# Patient Record
Sex: Female | Born: 1935 | Race: White | Hispanic: No | Smoking: Former smoker
Health system: Southern US, Community
[De-identification: ages and names within clinical notes are randomized; demographics above are authoritative.]

## PROBLEM LIST (undated history)

## (undated) DIAGNOSIS — K219 Gastro-esophageal reflux disease without esophagitis: Secondary | ICD-10-CM

## (undated) DIAGNOSIS — F329 Major depressive disorder, single episode, unspecified: Secondary | ICD-10-CM

## (undated) DIAGNOSIS — C8590 Non-Hodgkin lymphoma, unspecified, unspecified site: Secondary | ICD-10-CM

## (undated) DIAGNOSIS — I209 Angina pectoris, unspecified: Secondary | ICD-10-CM

## (undated) DIAGNOSIS — R002 Palpitations: Secondary | ICD-10-CM

## (undated) DIAGNOSIS — R630 Anorexia: Secondary | ICD-10-CM

## (undated) DIAGNOSIS — J189 Pneumonia, unspecified organism: Secondary | ICD-10-CM

## (undated) DIAGNOSIS — I10 Essential (primary) hypertension: Secondary | ICD-10-CM

## (undated) DIAGNOSIS — F419 Anxiety disorder, unspecified: Secondary | ICD-10-CM

## (undated) DIAGNOSIS — I2510 Atherosclerotic heart disease of native coronary artery without angina pectoris: Secondary | ICD-10-CM

## (undated) DIAGNOSIS — R011 Cardiac murmur, unspecified: Secondary | ICD-10-CM

## (undated) DIAGNOSIS — H269 Unspecified cataract: Secondary | ICD-10-CM

## (undated) DIAGNOSIS — E119 Type 2 diabetes mellitus without complications: Secondary | ICD-10-CM

## (undated) DIAGNOSIS — H3530 Unspecified macular degeneration: Secondary | ICD-10-CM

## (undated) DIAGNOSIS — E7800 Pure hypercholesterolemia, unspecified: Secondary | ICD-10-CM

## (undated) DIAGNOSIS — R079 Chest pain, unspecified: Secondary | ICD-10-CM

## (undated) DIAGNOSIS — I951 Orthostatic hypotension: Secondary | ICD-10-CM

## (undated) DIAGNOSIS — Z8489 Family history of other specified conditions: Secondary | ICD-10-CM

## (undated) DIAGNOSIS — I499 Cardiac arrhythmia, unspecified: Secondary | ICD-10-CM

## (undated) DIAGNOSIS — F32A Depression, unspecified: Secondary | ICD-10-CM

## (undated) DIAGNOSIS — G8929 Other chronic pain: Secondary | ICD-10-CM

## (undated) HISTORY — DX: Pneumonia, unspecified organism: J18.9

## (undated) HISTORY — DX: Palpitations: R00.2

## (undated) HISTORY — PX: ANKLE SURGERY: SHX546

## (undated) HISTORY — DX: Atherosclerotic heart disease of native coronary artery without angina pectoris: I25.10

## (undated) HISTORY — DX: Orthostatic hypotension: I95.1

## (undated) HISTORY — DX: Cardiac murmur, unspecified: R01.1

## (undated) HISTORY — DX: Major depressive disorder, single episode, unspecified: F32.9

## (undated) HISTORY — DX: Hypomagnesemia: E83.42

## (undated) HISTORY — DX: Unspecified cataract: H26.9

## (undated) HISTORY — DX: Type 2 diabetes mellitus without complications: E11.9

## (undated) HISTORY — DX: Anorexia: R63.0

## (undated) HISTORY — PX: ABDOMINAL HYSTERECTOMY: SHX81

## (undated) HISTORY — DX: Essential (primary) hypertension: I10

## (undated) HISTORY — DX: Chest pain, unspecified: R07.9

## (undated) HISTORY — PX: TOOTH EXTRACTION: SUR596

## (undated) HISTORY — DX: Depression, unspecified: F32.A

## (undated) HISTORY — DX: Unspecified macular degeneration: H35.30

## (undated) HISTORY — DX: Pure hypercholesterolemia, unspecified: E78.00

## (undated) HISTORY — PX: KNEE SURGERY: SHX244

## (undated) HISTORY — PX: INSERTION / PLACEMENT PLEURAL CATHETER: SUR721

## (undated) HISTORY — PX: PARTIAL HYSTERECTOMY: SHX80

## (undated) HISTORY — DX: Anxiety disorder, unspecified: F41.9

## (undated) HISTORY — DX: Non-Hodgkin lymphoma, unspecified, unspecified site: C85.90

## (undated) HISTORY — DX: Cardiac arrhythmia, unspecified: I49.9

## (undated) HISTORY — DX: Other chronic pain: G89.29

---

## 2000-01-25 HISTORY — PX: CARDIAC CATHETERIZATION: SHX172

## 2000-06-21 ENCOUNTER — Inpatient Hospital Stay (HOSPITAL_COMMUNITY): Admission: AD | Admit: 2000-06-21 | Discharge: 2000-06-22 | Payer: Self-pay | Admitting: Cardiovascular Disease

## 2000-06-21 ENCOUNTER — Encounter: Payer: Self-pay | Admitting: Cardiovascular Disease

## 2000-06-22 ENCOUNTER — Encounter: Payer: Self-pay | Admitting: Cardiovascular Disease

## 2003-01-08 ENCOUNTER — Other Ambulatory Visit: Payer: Self-pay

## 2004-01-05 ENCOUNTER — Ambulatory Visit: Payer: Self-pay | Admitting: Specialist

## 2004-01-05 ENCOUNTER — Other Ambulatory Visit: Payer: Self-pay

## 2004-03-18 ENCOUNTER — Ambulatory Visit: Payer: Self-pay | Admitting: Unknown Physician Specialty

## 2004-03-21 ENCOUNTER — Emergency Department: Payer: Self-pay | Admitting: Internal Medicine

## 2004-03-21 ENCOUNTER — Other Ambulatory Visit: Payer: Self-pay

## 2004-05-26 ENCOUNTER — Ambulatory Visit: Payer: Self-pay | Admitting: Otolaryngology

## 2004-09-20 ENCOUNTER — Ambulatory Visit: Payer: Self-pay | Admitting: Obstetrics and Gynecology

## 2005-06-24 ENCOUNTER — Ambulatory Visit: Payer: Self-pay | Admitting: Specialist

## 2005-10-03 ENCOUNTER — Ambulatory Visit: Payer: Self-pay | Admitting: Specialist

## 2005-10-06 ENCOUNTER — Ambulatory Visit: Payer: Self-pay | Admitting: Obstetrics and Gynecology

## 2006-11-30 ENCOUNTER — Ambulatory Visit: Payer: Self-pay | Admitting: Obstetrics and Gynecology

## 2006-12-27 ENCOUNTER — Emergency Department: Payer: Self-pay | Admitting: Internal Medicine

## 2006-12-27 ENCOUNTER — Other Ambulatory Visit: Payer: Self-pay

## 2007-09-19 ENCOUNTER — Encounter: Payer: Self-pay | Admitting: Cardiovascular Disease

## 2007-09-19 ENCOUNTER — Inpatient Hospital Stay: Payer: Self-pay | Admitting: Specialist

## 2007-09-19 ENCOUNTER — Other Ambulatory Visit: Payer: Self-pay

## 2007-10-05 ENCOUNTER — Encounter: Payer: Self-pay | Admitting: Cardiovascular Disease

## 2007-12-04 ENCOUNTER — Ambulatory Visit: Payer: Self-pay | Admitting: Specialist

## 2008-06-06 ENCOUNTER — Ambulatory Visit: Payer: Self-pay | Admitting: Family

## 2009-01-07 ENCOUNTER — Encounter: Payer: Self-pay | Admitting: Cardiovascular Disease

## 2009-03-09 ENCOUNTER — Encounter: Payer: Self-pay | Admitting: Cardiovascular Disease

## 2009-03-13 ENCOUNTER — Encounter: Payer: Self-pay | Admitting: Cardiovascular Disease

## 2009-03-16 ENCOUNTER — Encounter: Payer: Self-pay | Admitting: Cardiovascular Disease

## 2009-08-31 ENCOUNTER — Encounter: Payer: Self-pay | Admitting: Cardiovascular Disease

## 2009-09-16 ENCOUNTER — Ambulatory Visit: Payer: Self-pay | Admitting: Cardiovascular Disease

## 2009-09-16 DIAGNOSIS — R011 Cardiac murmur, unspecified: Secondary | ICD-10-CM

## 2009-09-16 DIAGNOSIS — I10 Essential (primary) hypertension: Secondary | ICD-10-CM | POA: Insufficient documentation

## 2009-09-16 DIAGNOSIS — E785 Hyperlipidemia, unspecified: Secondary | ICD-10-CM

## 2009-09-17 ENCOUNTER — Telehealth: Payer: Self-pay | Admitting: Cardiovascular Disease

## 2009-10-08 ENCOUNTER — Ambulatory Visit: Payer: Self-pay | Admitting: Specialist

## 2009-12-07 ENCOUNTER — Telehealth (INDEPENDENT_AMBULATORY_CARE_PROVIDER_SITE_OTHER): Payer: Self-pay | Admitting: Radiology

## 2009-12-07 ENCOUNTER — Telehealth (INDEPENDENT_AMBULATORY_CARE_PROVIDER_SITE_OTHER): Payer: Self-pay | Admitting: *Deleted

## 2010-01-13 ENCOUNTER — Encounter: Payer: Self-pay | Admitting: Cardiovascular Disease

## 2010-01-13 ENCOUNTER — Ambulatory Visit: Payer: Self-pay | Admitting: Cardiovascular Disease

## 2010-01-13 DIAGNOSIS — R079 Chest pain, unspecified: Secondary | ICD-10-CM

## 2010-01-24 HISTORY — PX: TUMOR REMOVAL: SHX12

## 2010-02-21 LAB — CONVERTED CEMR LAB
BUN: 11 mg/dL
Brain Natriuretic Peptide: 88.2
CO2: 25 meq/L
Calcium: 9.5 mg/dL
Chloride: 96 meq/L
Creatinine, Ser: 0.8 mg/dL
Glucose, Bld: 152 mg/dL
Potassium: 4.5 meq/L
Sodium: 135 meq/L

## 2010-02-23 NOTE — Progress Notes (Signed)
Summary: MEDICATION QUESTIONS  Phone Note Call from Patient Call back at Home Phone [phone]   Caller: SELF Call For: [facility] Summary of Call: PT HAS QUESTIONS ABOUT STOPPING TOPEROL OR DIOVAN Initial call taken by: Harlon Flor,  September 17, 2009 2:21 PM  Follow-up for Phone Call        pt's questions answered. Follow-up by: Benedict Needy, RN,  September 18, 2009 9:33 AM

## 2010-02-23 NOTE — Progress Notes (Signed)
Summary: Nuclear Pre-Procedure  Phone Note Call from Patient Call back at Home Phone [phone]   Summary of Call: Reviewed information on [employer] Information Sheet (see scanned document for further details).  Spoke with the patient. Stanton Kidney, EMT-P  December 07, 2009 9:19 AM      Nuclear Med Background Indications for Stress Test: Evaluation for Ischemia   History: Echo, Heart Catheterization, Myocardial Perfusion Study  History Comments: 8/09 Echo '02 MPS: NL '02 Heart Cath     Nuclear Pre-Procedure Cardiac Risk Factors: Hypertension, IDDM Type 2, Lipids, Obesity Height (in): 62

## 2010-02-23 NOTE — Assessment & Plan Note (Signed)
Summary: 1 year F/U   Visit Type:  Follow-up Primary Caroline Russo:  Caroline Russo,M.D.  CC:  Denies chest pain or shortness of breath..  History of Present Illness: Caroline Russo is a pleasant 77 year old woman with hypertension, nonobstructive coronary artery disease in 2002 for diabetes, obesity who presents to reestablish care. She is a patient of Dr. Leavy Cella.  Overall, she reports that she is doing well though is very discouraged her weight loss. She is eating the wrong things, not exercising. She denies any significant shortness of breath, chest pain. Otherwise has no new complaints.  In and going through her medications, she has been taking metoprolol succinate twice a day. She does not want to take a statin as she is afraid of them. She has not been monitoring her blood pressure at home.  EKG shows normal sinus rhythm with APCs, rate 63 beats per minute, no significant ST or T wave changes  Current Medications (verified): 1)  Alprazolam 0.25 Mg Tabs (Alprazolam) .... As Needed 2)  Aspirin 81 Mg Tbec (Aspirin) .... Take One Tablet Daily 3)  Levemir Flexpen 100 Unit/ml Soln (Insulin Detemir) .... 33 Units Two Times A Day 4)  Protonix 40 Mg Tbec (Pantoprazole Sodium) .Marland Kitchen.. 1 Tab Two Times A Day 5)  Metoprolol Succinate 50 Mg Xr24h-Tab (Metoprolol Succinate) .... Take One Tablet By Mouth Daily 6)  Vitamin B-12 100 Mcg Tabs (Cyanocobalamin) .Marland Kitchen.. 1 Tab Daily 7)  Vitamin D 400 Unit Caps (Cholecalciferol) .Marland Kitchen.. 1 Tab Daily 8)  Vitamin E 100 Unit Caps (Vitamin E) .Marland Kitchen.. 1 Tab Daily 9)  Antivert 12.5 Mg Tabs (Meclizine Hcl) .... As Needed 10)  Ocuvite Preservision  Tabs (Multiple Vitamins-Minerals) .Marland Kitchen.. 1 Tab Daily 11)  Diovan Hct 160-12.5 Mg Tabs (Valsartan-Hydrochlorothiazide) .Marland Kitchen.. 1 Tab Daily  Allergies (verified): No Known Drug Allergies  Past History:  Past Medical History: Last updated: 04/14/2009 Hypertension.  Type II diabetes mellitus, insulin requiring.  Hypercholesterolemia.    History of murmur.  Chronic tinnitus and inner ear pressure.   Past Surgical History: Last updated: 04/14/2009 cardiac cath 2002  Social History: Last updated: 04/14/2009 Retired  Widowed  3 children, 7 grandchildren, 2 great-grandchildren  Tobacco Use - No.   Risk Factors: Smoking Status: never (04/14/2009)  Review of Systems       The patient complains of weight gain.  The patient denies fever, weight loss, vision loss, decreased hearing, hoarseness, chest pain, syncope, dyspnea on exertion, peripheral edema, prolonged cough, abdominal pain, incontinence, muscle weakness, depression, and enlarged lymph nodes.    Vital Signs:  Patient profile:   77 year old female Height:      62 inches Weight:      169 pounds BMI:     31.02 Pulse rate:   63 / minute BP sitting:   169 / 76  (left arm) Cuff size:   large  Vitals Entered By: Bishop Dublin, CMA (September 16, 2009 10:48 AM)  Physical Exam  General:  Well developed, well nourished, in no acute distress. Head:  normocephalic and atraumatic Neck:  Neck supple, no JVD. No masses, thyromegaly or abnormal cervical nodes. Lungs:  Clear bilaterally to auscultation and percussion. Heart:  Non-displaced PMI, chest non-tender; regular rate and rhythm, S1, S2 with I-II/VI SEM RSB, no rubs or gallops. Carotid upstroke normal, no bruit. Pedals normal pulses. No edema, no varicosities. Abdomen:   abdomen soft and non-tender without masses, obese Msk:  Back normal, normal gait. Muscle strength and tone normal. Pulses:  pulses normal in all  4 extremities Extremities:  No clubbing or cyanosis. Neurologic:  Alert and oriented x 3. Skin:  Intact without lesions or rashes. Psych:  Normal affect.   Impression & Recommendations:  Problem # 1:  ESSENTIAL HYPERTENSION, BENIGN (ICD-401.1) she is concerned about the cost of her medication. We have suggested that she could change her Diovan HCT 2 losartan HCT 100/25 mg daily. We have also  suggested that she decrease her metoprolol succinate to daily, down from b.i.d. I have asked her to monitor her blood pressure. We'll try to obtain her most recent lipid panel from Dr. Leavy Cella.   The following medications were removed from the medication list:    Norvasc 2.5 Mg Tabs (Amlodipine besylate) .Marland Kitchen... 1 tab at bedtime Her updated medication list for this problem includes:    Aspirin 81 Mg Tbec (Aspirin) .Marland Kitchen... Take one tablet daily    Metoprolol Succinate 50 Mg Xr24h-tab (Metoprolol succinate) .Marland Kitchen... Take one tablet by mouth daily    Losartan Potassium-hctz 100-25 Mg Tabs (Losartan potassium-hctz) .Marland Kitchen... Take 1 tablet by mouth once a day  Orders: EKG w/ Interpretation (93000)  Problem # 2:  HYPERLIPIDEMIA-MIXED (ICD-272.4) We have talked to her about the benefits of lower weight and exercise. She will try to start walking more frequently in an effort to keep her weight trending downward  She does have mild coronary artery disease by catheterization 9 years ago. Currently with no symptoms of angina or worsening shortness of breath.  Patient Instructions: 1)  Your physician has recommended you make the following change in your medication: STOP diovan START losartan hctz DECREASE metoprolol to daily.  2)  Your physician wants you to follow-up in:  1 year  You will receive a reminder letter in the mail two months in advance. If you don't receive a letter, please call our office to schedule the follow-up appointment. Prescriptions: LOSARTAN POTASSIUM-HCTZ 100-25 MG TABS (LOSARTAN POTASSIUM-HCTZ) Take 1 tablet by mouth once a day  #30 x 4   Entered by:   Benedict Needy, RN   Authorized by:   Dossie Arbour MD   Signed by:   Benedict Needy, RN on 09/16/2009   Method used:   Electronically to        [facility] [phone]* (retail)       [address] [place], [place]  [address]       Ph: [phone]       Fax: [phone]   RxID:   [id number]

## 2010-02-23 NOTE — Cardiovascular Report (Signed)
Summary: Cardiac Cath Other  Cardiac Cath Other   Imported By: Harlon Flor 09/24/2009 15:46:18  _____________________________________________________________________  External Attachment:    Type:   Image     Comment:   External Document

## 2010-02-23 NOTE — Miscellaneous (Signed)
  Clinical Lists Changes  Observations: Added new observation of BNP: 88.2  (03/13/2009 12:36) Added new observation of CALCIUM: 9.5 mg/dL (04/54/0981 19:14) Added new observation of CO2 PLSM/SER: 25 meq/L (03/13/2009 12:36) Added new observation of CL SERUM: 96 meq/L (03/13/2009 12:36) Added new observation of K SERUM: 4.5 meq/L (03/13/2009 12:36) Added new observation of NA: 135 meq/L (03/13/2009 12:36) Added new observation of CREATININE: 0.80 mg/dL (78/29/5621 30:86) Added new observation of BUN: 11 mg/dL (57/84/6962 95:28) Added new observation of BG RANDOM: 152 mg/dL (41/32/4401 02:72)      -  Date:  03/13/2009    BG Random: 152    BUN: 11    Creatinine: 0.80    Sodium: 135    Potassium: 4.5    Chloride: 96    CO2 Total: 25    Calcium: 9.5    BNP: 88.2

## 2010-02-23 NOTE — Progress Notes (Signed)
Summary: stress test cancelled  Phone Note Call from Patient   Summary of Call: Patient called to cancel nuclear stress test scheduled for 12/08/09 due to stomach virus. She will call to reschedule. Initial call taken by: Domenic Polite, CNMT,  December 07, 2009 2:17 PM Call placed to: Patient

## 2010-02-23 NOTE — Letter (Signed)
Summary: Medical Record Release  Medical Record Release   Imported By: Harlon Flor 04/17/2009 08:35:45  _____________________________________________________________________  External Attachment:    Type:   Image     Comment:   External Document

## 2010-02-25 NOTE — Assessment & Plan Note (Signed)
Summary: ROV/AMD   Visit Type:  Follow-up Primary Provider:  Casimiro Needle Russo,M.D.  CC:  c/o burning and pain in between of shoulders blades. Pain is more over to the left.  History of Present Illness: Caroline Russo is a pleasant 77 year old woman with hypertension, nonobstructive coronary artery disease in 2002, diabetes, obesity who presents for routine followup  she has had chronic chest pain and back pain. The back pain appears to be musculoskeletal, comes on at night time while she is sleeping, hurts with certain movements and gradually resolves [place] date. It is not exacerbated by exertion or exercise.  Her chest discomfort comes once every 2 weeks. She reports having significant GERD type symptoms. She cannot eat certain foods. She has raised her bed up on an incline though continues to have occasional chest burning, sometimes with radiation to the arm. she has not tried nitroglycerin. She is uncertain how long her symptoms last. again she does not have these with exertion or exercise  EKG shows normal sinus rhythm with APCs, rate 63 beats per minute, no significant ST or T wave changes  Current Medications (verified): 1)  Alprazolam 0.25 Mg Tabs (Alprazolam) .... As Needed 2)  Aspirin 81 Mg Tbec (Aspirin) .... Take One Tablet Daily 3)  Levemir Flexpen 100 Unit/ml Soln (Insulin Detemir) .... 33 Units Two Times A Day 4)  Protonix 40 Mg Tbec (Pantoprazole Sodium) .Marland Kitchen.. 1 Tab Two Times A Day 5)  Metoprolol Succinate 50 Mg Xr24h-Tab (Metoprolol Succinate) .... Take One Tablet Two Times A Day 6)  Vitamin B-12 100 Mcg Tabs (Cyanocobalamin) .Marland Kitchen.. 1 Tab Daily 7)  Vitamin D 400 Unit Caps (Cholecalciferol) .Marland Kitchen.. 1 Tab Daily 8)  Vitamin E 100 Unit Caps (Vitamin E) .Marland Kitchen.. 1 Tab Daily 9)  Ocuvite Preservision  Tabs (Multiple Vitamins-Minerals) .Marland Kitchen.. 1 Tab Daily 10)  Losartan Potassium-Hctz 100-25 Mg Tabs (Losartan Potassium-Hctz) .... Take 1 Tablet By Mouth Once A Day  Allergies (verified): No Known  Drug Allergies  Past History:  Past Medical History: Last updated: 04/14/2009 Hypertension.  Type II diabetes mellitus, insulin requiring.  Hypercholesterolemia.  History of murmur.  Chronic tinnitus and inner ear pressure.   Past Surgical History: Last updated: 04/14/2009 cardiac cath 2002  Social History: Last updated: 04/14/2009 Retired  Widowed  3 children, 7 grandchildren, 2 great-grandchildren  Tobacco Use - No.   Risk Factors: Smoking Status: never (04/14/2009)  Review of Systems       The patient complains of chest pain.  The patient denies fever, weight loss, weight gain, vision loss, decreased hearing, hoarseness, syncope, dyspnea on exertion, peripheral edema, prolonged cough, abdominal pain, incontinence, muscle weakness, depression, and enlarged lymph nodes.         Back pain  Vital Signs:  Patient profile:   77 year old female Height:      62 inches Weight:      162.25 pounds BMI:     29.78 Pulse rate:   64 / minute BP sitting:   168 / 74  (left arm) Cuff size:   regular  Vitals Entered By: Lysbeth Galas CMA (January 13, 2010 11:41 AM)  Physical Exam  General:  Well developed, well nourished, in no acute distress. Head:  normocephalic and atraumatic Neck:  Neck supple, no JVD. No masses, thyromegaly or abnormal cervical nodes. Lungs:  Clear bilaterally to auscultation and percussion. Heart:  Non-displaced PMI, chest non-tender; regular rate and rhythm, S1, S2 without murmurs, rubs or gallops. Carotid upstroke normal, no bruit.  Pedals normal pulses.  No edema, no varicosities. Abdomen:  Bowel sounds positive; abdomen soft and non-tender without masses Msk:  Back normal, normal gait. Muscle strength and tone normal. Pulses:  pulses normal in all 4 extremities Extremities:  No clubbing or cyanosis. Neurologic:  Alert and oriented x 3. Skin:  Intact without lesions or rashes. Psych:  Normal affect.   Impression & Recommendations:  Problem # 1:   CHEST PAIN-UNSPECIFIED (ICD-786.50) rare chest pain and episodes concerning for GI etiology. She has minimal coronary artery disease on previous catheterization, negative stress test several years ago. No symptoms with exertion and she always has complaints in the middle of the night.  She does have a history of GERD. We had suggested she talk with Dr. Mechele Collin who she reports has known her in the past. Uncertain if she could have symptoms from hiatal hernia, esophageal spasm, erosions, or other etiology. I suggested she could try a nitroglycerin sublingual to see if this helps her symptoms in case she is having spasm.  she does report that her stomach has not been the same since her viral gastroenteritis in October of this year.  Her updated medication list for this problem includes:    Aspirin 81 Mg Tbec (Aspirin) .Marland Kitchen... Take one tablet daily    Metoprolol Succinate 50 Mg Xr24h-tab (Metoprolol succinate) .Marland Kitchen... Take one tablet two times a day    Nitrostat 0.4 Mg Subl (Nitroglycerin) .Marland Kitchen... 1 tablet under tongue at onset of chest pain; you may repeat every 5 minutes for up to 3 doses.  Problem # 2:  HYPERLIPIDEMIA-MIXED (ICD-272.4) She does not want to take a statin at this time and has mentioned this in the past.  Problem # 3:  ESSENTIAL HYPERTENSION, BENIGN (ICD-401.1) blood pressure is elevated today. She states that it is well controlled at home. We have asked her to monitor her blood pressure at home and provide [place] with her numbers for our review.  Her updated medication list for this problem includes:    Aspirin 81 Mg Tbec (Aspirin) .Marland Kitchen... Take one tablet daily    Metoprolol Succinate 50 Mg Xr24h-tab (Metoprolol succinate) .Marland Kitchen... Take one tablet two times a day    Losartan Potassium-hctz 100-25 Mg Tabs (Losartan potassium-hctz) .Marland Kitchen... Take 1 tablet by mouth once a day  Patient Instructions: 1)  Your physician recommends that you schedule a follow-up appointment in: 6 months 2)  Your  physician recommends that you continue on your current medications as directed. Please refer to the Current Medication list given to you today. START Nitroglycerin 0.4mg  SL for chest pain symptoms. 3)  You have been referred to Dr. Mechele Collin March 08, 2010 @ 10:00am Prescriptions: NITROSTAT 0.4 MG SUBL (NITROGLYCERIN) 1 tablet under tongue at onset of chest pain; you may repeat every 5 minutes for up to 3 doses.  #25 x 3   Entered by:   Lanny Hurst RN   Authorized by:   Dossie Arbour MD   Signed by:   Lanny Hurst RN on 01/13/2010   Method used:   Electronically to        [facility] [phone]* (retail)       [address] [place], [place]  [address]       Ph: [phone]       Fax: [phone]   RxID:   [id number]

## 2010-05-13 ENCOUNTER — Ambulatory Visit: Payer: Self-pay | Admitting: Otolaryngology

## 2010-05-14 ENCOUNTER — Telehealth: Payer: Self-pay | Admitting: Emergency Medicine

## 2010-05-14 NOTE — Telephone Encounter (Signed)
[facility] faxed over a surgical clearance letter. Pt is going to have left superficial parotidectomy with excision left cervical node/possible neck dissection. Pt cleared for surgery?

## 2010-05-17 NOTE — Telephone Encounter (Signed)
Clearance faxed to [facility].

## 2010-05-17 NOTE — Telephone Encounter (Signed)
Ok to have surgery. She would be low risk.

## 2010-06-11 NOTE — Discharge Summary (Signed)
[place]. [facility]  Patient:    Caroline Russo, Caroline Russo                       MRN: [id number] Adm. Date:  [id number] Disc. Date: 06/22/00 Attending:  Virgina Evener Dictator:   Raymon Mutton, P.A.                           Discharge Summary  REASON FOR ADMISSION:  [name] is a 77 year old, Caucasian woman who was referred to Dr. Tresa Endo per her own request for coronary angiography from [facility] when she was advised to have cardiac catheterization.  DISCHARGE DIAGNOSES: 1. Atypical chest pain, etiology undetermined. 2. Presyncopal episode. 3. Hypertension. 4. Hyperlipidemia. 5. Adult-onset diabetes mellitus.  PROCEDURE:  Coronary angiography on Jun 22, 2000, performed by Dr. Tresa Endo.  COMPLICATIONS:  None.  CONSULTATIONS:  None.  HISTORY OF PRESENT ILLNESS:  [name] was admitted to the [facility] with complaints of left collar bone pain, right arm pain, lightheadedness, nausea and some shortness of breath on Jun 20, 2000.  She said that for the previous couple of days she did not feel well and developed and episode of diarrhea on Saturday and felt really weak and achy.  Upon arrival to the emergency room at [facility] she was admitted to the telemetry unit and was started on Norvasc 5 mg and on aspirin.  She had Nitro-Bid 2% ointment applied to her chest with some relief.  Her cardiac enzymes were monitored and CK total was negative, CK-MB was negative and troponin was borderline.  The patient was followed by her cardiologist in [place], Dr. Barnabas Lister, and he suggested she have cardiac catheterization despite her negative Cardiolite test on May 10, 2000.  That test showed normal perfusion study with no stress-induced ischemia, normal LV function with ejection fraction 60% and normal wall motion of the left ventricle.  No evidence of ischemia noted.  The patient refused to have cardiac catheterization  in [facility] and was transferred to [facility] under Dr. Ellin Goodie care.  HOSPITAL COURSE:  Upon arrival, she was asymptomatic.  We checked her cardiac enzymes yesterday and her troponin was again elevated at 0.17.  Her potassium was 3.8, BUN 9 and creatinine 0.8, but glucose was elevated at 174.  We started the patient on Protonix and continued with beta-blocker and calcium channel blocker treatment.  On the next day, the patient underwent coronary angiography performed by Dr. Tresa Endo.  She tolerated the procedure well and was transferred to the holding area in stable condition.  Her groin did not develop any bleeding or bruising.  Her catheterization showed no ischemia, mitral valve prolapse and mitral regurgitation with ejection fraction 65% to 70%.  The patient was kept on bed rest three hours after procedure and was discharged home in stable condition.  For more detailed information, please see the cardiac catheterization report from Jun 22, 2000.  FOLLOWUP:  Follow up with Dr. Barnabas Lister in two to three weeks for further treatment in [place].  DISCHARGE MEDICATIONS: 1. Coated aspirin 81 mg daily. 2. Toprol XL 50 mg daily. 3. Norvasc 5 mg daily. 4. Protonix 40 mg daily. 5. Nitrostat 0.4 mg sublingual p.r.n. chest pain.  ACTIVITY:  No driving, no heavy lifting, no strenuous physical activity x 72 hours after discharge.  SPECIAL INSTRUCTIONS:  The patient was instructed to watch for signs of bleeding, oozing, increased pain or  swelling and to report those signs to Dr. Ellin Goodie office.  She was allowed to shower, but pat dry without rubbing. DD:  06/22/00 TD:  06/22/00 Job: [id number]

## 2010-06-11 NOTE — Cardiovascular Report (Signed)
[place]. [facility]  Patient:    Caroline Russo, Caroline Russo                       MRN: [id number] Adm. Date:  [id number] Attending:  Virgina Evener CC:         Orville Govern, c/o Lennette Bihari, M.D.  Alan Mulder, M.D., [place], [place]  [facility]   Cardiac Catheterization  PROCEDURE:  Left heart catheterization:  Cine coronary angiography; biplane cine left ventriculography; distal aortography.  INDICATIONS:  The patient is a very pleasant 77 year old white female who was admitted to [facility] on transfer from [facility] for diagnostic catheterization.  Patient has a history of hypertension, hyperlipidemia and hyperglycemia.  Several months ago, an adenosine Cardiolite scan showed relatively normal perfusion.  She was admitted to [facility] over the past several days with chest pain radiating to her left arm as well as apparent presyncope.  Her initial troponin I was normal but subsequent troponin was positive; she was referred for definitive diagnostic cardiac catheterization.  HEMODYNAMIC DATA:  Central aortic pressure 154/67; left ventricular pressure 154/14.  ANGIOGRAPHIC DATA:  The left main coronary artery was angiographically normal and bifurcated into an LAD and left circumflex system.  The LAD was moderately tortuous.  It gave rise to several septal perforating arteries and a ______  vessel.  The circumflex had a 30% ostial narrowing.  It gave rise to one major obtuse marginal vessel.  The circumflex was mildly tortuous but free of significant obstructive disease.  The right coronary artery was moderately tortuous.  There were no areas of stenosis.  Biplane cine left ventriculography revealed hyperdynamic LV function with an ejection fraction of approximately 65-70%.  There was mild mitral valve prolapse with 1% mitral regurgitation.  Distal aortography did not demonstrate any evidence for renal  artery stenosis. There was no significant aortoiliac disease.  IMPRESSION: 1. Hyperdynamic left ventricular function. 2. Mild mitral valve prolapse with 1+ mitral regurgitation. 3. No significant coronary obstructive disease but with evidence for    30% ostial narrowing in the left circumflex coronary artery. DD:  06/22/00 TD:  06/22/00 Job: [id number]

## 2010-07-12 ENCOUNTER — Other Ambulatory Visit: Payer: Self-pay

## 2010-07-12 MED ORDER — LOSARTAN POTASSIUM-HCTZ 100-25 MG PO TABS: 1.0000 | ORAL_TABLET | Freq: Every day | ORAL | Status: DC

## 2010-07-12 MED ORDER — PANTOPRAZOLE SODIUM 40 MG PO TBEC: 40.0000 mg | DELAYED_RELEASE_TABLET | Freq: Two times a day (BID) | ORAL | Status: DC

## 2010-08-17 ENCOUNTER — Other Ambulatory Visit: Payer: Self-pay | Admitting: *Deleted

## 2010-08-17 ENCOUNTER — Telehealth: Payer: Self-pay

## 2010-08-17 MED ORDER — METOPROLOL SUCCINATE ER 50 MG PO TB24: 50.0000 mg | ORAL_TABLET | Freq: Every day | ORAL | Status: DC

## 2010-08-17 NOTE — Telephone Encounter (Signed)
Needs a refill on Metoprolol succ 25 mg.

## 2010-08-18 ENCOUNTER — Encounter: Payer: Self-pay | Admitting: Cardiovascular Disease

## 2010-09-01 ENCOUNTER — Telehealth: Payer: Self-pay

## 2010-09-01 MED ORDER — METOPROLOL SUCCINATE ER 50 MG PO TB24: 50.0000 mg | ORAL_TABLET | Freq: Every day | ORAL | Status: DC

## 2010-09-01 NOTE — Telephone Encounter (Signed)
Needs a refill metoprolol succ 50 mg take one tablet twice a day.

## 2010-09-29 ENCOUNTER — Encounter: Payer: Self-pay | Admitting: Cardiovascular Disease

## 2010-10-01 ENCOUNTER — Ambulatory Visit (INDEPENDENT_AMBULATORY_CARE_PROVIDER_SITE_OTHER): Payer: Medicare Other | Admitting: Cardiovascular Disease

## 2010-10-01 ENCOUNTER — Encounter: Payer: Self-pay | Admitting: Cardiovascular Disease

## 2010-10-01 DIAGNOSIS — R079 Chest pain, unspecified: Secondary | ICD-10-CM

## 2010-10-01 DIAGNOSIS — R011 Cardiac murmur, unspecified: Secondary | ICD-10-CM

## 2010-10-01 DIAGNOSIS — E785 Hyperlipidemia, unspecified: Secondary | ICD-10-CM

## 2010-10-01 DIAGNOSIS — I10 Essential (primary) hypertension: Secondary | ICD-10-CM

## 2010-10-01 NOTE — Patient Instructions (Addendum)
You are doing well. Please try red yeast rice Please write down your blood pressure numbers and drop them by the office. Please call us if you have new issues that need to be addressed before your next appt.  We will call you for a follow up Appt. In 12 months

## 2010-10-01 NOTE — Progress Notes (Signed)
Patient ID: Caroline Russo, female    DOB: 1919/12/15, 77 y.o.   MRN: [id number]  HPI Comments: [name] is a pleasant 77 year old woman with hypertension, nonobstructive coronary artery disease in 2002, diabetes, obesity who presents for routine followup   she has had chronic chest pain and back pain. The back pain appears to be musculoskeletal, comes on at night time while she is sleeping, hurts with certain movements and gradually resolves. It is not exacerbated by exertion or exercise.   Previous epsiodes of chest discomfort come once every 2 weeks. She reports having significant GERD type symptoms. She cannot eat certain foods. She has raised her bed up on an incline though continues to have occasional chest burning, sometimes with radiation to the arm.  she does not have these with exertion or exercise  She is recovering from recent neck surgery where she had a tumor removed on the left. It was first noted in March or April, resection in June after it was found to be growing rapidly. Incision has healed well. Otherwise she has no complaints.  She does not like to take cholesterol medication. LDL was 120 last year.   EKG shows normal sinus rhythm , rate 59 beats per minute, nonspecific  ST changes In the inferior leads      Outpatient Encounter Prescriptions as of 10/01/2010  Medication Sig Dispense Refill  . ALPRAZolam (XANAX) 0.25 MG tablet Take 0.25 mg by mouth as needed.        Marland Kitchen aspirin (ASPIR-81) 81 MG EC tablet Take 81 mg by mouth daily.        . Cholecalciferol (VITAMIN D) 400 UNITS capsule Take 400 Units by mouth daily.        . insulin detemir (LEVEMIR) 100 UNIT/ML injection Inject 33 Units into the skin 2 (two) times daily.        Marland Kitchen losartan-hydrochlorothiazide (HYZAAR) 100-25 MG per tablet Take 1 tablet by mouth daily.  30 tablet  6  . metoprolol (TOPROL XL) 50 MG 24 hr tablet Take 1 tablet (50 mg total) by mouth daily.  60 tablet  6  . Multiple Vitamins-Minerals (OCUVITE  PRESERVISION) TABS Take by mouth daily.        . nitroGLYCERIN (NITROSTAT) 0.4 MG SL tablet Place 0.4 mg under the tongue every 5 (five) minutes as needed.        . pantoprazole (PROTONIX) 40 MG tablet Take 1 tablet (40 mg total) by mouth 2 (two) times daily.  60 tablet  6  . vitamin B-12 (CYANOCOBALAMIN) 100 MCG tablet Take 50 mcg by mouth daily.        . vitamin E 100 UNIT capsule Take 100 Units by mouth daily.           Review of Systems  Constitutional: Negative.   HENT: Negative.   Eyes: Negative.   Respiratory: Negative.   Cardiovascular: Negative.   Gastrointestinal: Negative.   Musculoskeletal: Negative.   Skin: Negative.   Neurological: Negative.   Hematological: Negative.   Psychiatric/Behavioral: Negative.   All other systems reviewed and are negative.    BP 146/73  Pulse 59  Ht 5' 2.5" (1.588 m)  Wt 161 lb (73.029 kg)  BMI 28.98 kg/m2   Physical Exam  Nursing note and vitals reviewed. Constitutional: She is oriented to person, place, and time. She appears well-developed and well-nourished.  HENT:  Head: Normocephalic.  Nose: Nose normal.  Mouth/Throat: Oropharynx is clear and moist.  Eyes: Conjunctivae are normal. Pupils are  equal, round, and reactive to light.  Neck: Normal range of motion. Neck supple. No JVD present.       incision on the left side of her neck is well healed  Cardiovascular: Normal rate, regular rhythm, S1 normal, S2 normal and intact distal pulses.  Exam reveals no gallop and no friction rub.   Murmur heard.  Crescendo systolic murmur is present with a grade of 1/6  Pulmonary/Chest: Effort normal and breath sounds normal. No respiratory distress. She has no wheezes. She has no rales. She exhibits no tenderness.  Abdominal: Soft. Bowel sounds are normal. She exhibits no distension. There is no tenderness.  Musculoskeletal: Normal range of motion. She exhibits no edema and no tenderness.  Lymphadenopathy:    She has no cervical  adenopathy.  Neurological: She is alert and oriented to person, place, and time. Coordination normal.  Skin: Skin is warm and dry. No rash noted. No erythema.  Psychiatric: She has a normal mood and affect. Her behavior is normal. Judgment and thought content normal.         Assessment and Plan

## 2010-10-02 NOTE — Assessment & Plan Note (Signed)
No significant symptoms of chest discomfort on today's visit.

## 2010-10-02 NOTE — Assessment & Plan Note (Addendum)
LDL was 120 last year. She is not particularly interested in cholesterol medication. We have suggested she could take red yeast rice

## 2010-10-02 NOTE — Assessment & Plan Note (Signed)
Recheck of her blood pressure is improved. Have asked her to monitor her blood pressure at home. She will start checking this on a regular basis.

## 2010-10-29 ENCOUNTER — Telehealth: Payer: Self-pay | Admitting: *Deleted

## 2010-10-29 NOTE — Telephone Encounter (Signed)
Pt's BP numbers starting 9/8: 137/55 62, 127/46 57, 128/49 57, 127/49 57, 118/45 57, 139/49 56, 114/44 57, 108/46 53, 130/61 55, 110/49 56, 105/45 52, 116/60 58

## 2010-10-31 NOTE — Telephone Encounter (Signed)
Cholesterol is great. No changes

## 2010-11-01 NOTE — Telephone Encounter (Signed)
Pt.notified

## 2010-12-13 ENCOUNTER — Telehealth: Payer: Self-pay | Admitting: Cardiovascular Disease

## 2010-12-13 NOTE — Telephone Encounter (Signed)
Numbness and tingling in her arms. Mainly her right arm. Having waves in eyes.

## 2010-12-13 NOTE — Telephone Encounter (Signed)
Pt states that she has had tingling in both arms R>L that wakes her up at night only for about 2-3 weeks. Today is only time it has occurred during daytime, and hand feels numb. Pt denies CP or SOB. Pt has h/o non-obstructive CAD 2002, was last seen 10/01/10 for chest/back pain that was thought to be musculoskeletal in nature. She does have h/o DM. PCP is Dr. Leavy Cella. I advised pt to see Dr. Leavy Cella if continues to r/o neuropathy or other cause, if thought to be cardiac related we can see pt. Pt will monitor her BP also in the meantime and call back next week with numbers.

## 2011-02-01 ENCOUNTER — Ambulatory Visit: Payer: Self-pay | Admitting: Specialist

## 2011-02-01 DIAGNOSIS — Z1231 Encounter for screening mammogram for malignant neoplasm of breast: Secondary | ICD-10-CM | POA: Diagnosis not present

## 2011-02-01 DIAGNOSIS — R920 Mammographic microcalcification found on diagnostic imaging of breast: Secondary | ICD-10-CM | POA: Diagnosis not present

## 2011-02-01 DIAGNOSIS — M6530 Trigger finger, unspecified finger: Secondary | ICD-10-CM | POA: Diagnosis not present

## 2011-02-09 ENCOUNTER — Telehealth: Payer: Self-pay

## 2011-02-09 MED ORDER — LOSARTAN POTASSIUM-HCTZ 100-25 MG PO TABS: 1.0000 | ORAL_TABLET | Freq: Every day | ORAL | Status: DC

## 2011-02-09 NOTE — Telephone Encounter (Signed)
Refill sent for losartan-hctz 100-25 mg take one tablet daily. 

## 2011-02-25 ENCOUNTER — Telehealth: Payer: Self-pay | Admitting: Cardiovascular Disease

## 2011-02-25 NOTE — Telephone Encounter (Signed)
Pt daughter calling stating that pt bp is spiking at night around 9:30. Vision is blurry, nausea. 219/74 taking all meds also took half a xanax and called down went to bed. Took regular meds this am and is feeling better but week. Daughter Cell [phone]

## 2011-02-25 NOTE — Telephone Encounter (Signed)
Spoke to pt's daughter; pt had one episode last night c/o nausea, blurry vision, BP 219/74 HR 56. Her BP normally not high, she takes metop succ 50mg  BID, Losartan-HCTZ 100-25 qd. Pt last seen 09/2010, no arrythmia hx, but did have GERD s/s. I told her to make sure pt is taking her Protonix twice a day. She says she had pt take 1/2 of xanax and she went to bed, this AM BP 117/41 HR 61. I told her sounds like her BP was high secondary to her sx. Told her if recurrent to see PMD, Dr. Leavy Cella. Otherwise, track s/s and BP/HR and if BP issues continue will discuss with Dr. Mariah Milling. Forwarded to Dr. Mariah Milling.

## 2011-03-11 DIAGNOSIS — E119 Type 2 diabetes mellitus without complications: Secondary | ICD-10-CM | POA: Diagnosis not present

## 2011-03-11 DIAGNOSIS — E785 Hyperlipidemia, unspecified: Secondary | ICD-10-CM | POA: Diagnosis not present

## 2011-03-11 DIAGNOSIS — K219 Gastro-esophageal reflux disease without esophagitis: Secondary | ICD-10-CM | POA: Diagnosis not present

## 2011-03-11 DIAGNOSIS — I10 Essential (primary) hypertension: Secondary | ICD-10-CM | POA: Diagnosis not present

## 2011-04-04 DIAGNOSIS — E119 Type 2 diabetes mellitus without complications: Secondary | ICD-10-CM | POA: Diagnosis not present

## 2011-04-04 DIAGNOSIS — I10 Essential (primary) hypertension: Secondary | ICD-10-CM | POA: Diagnosis not present

## 2011-04-06 DIAGNOSIS — M6530 Trigger finger, unspecified finger: Secondary | ICD-10-CM | POA: Diagnosis not present

## 2011-04-12 DIAGNOSIS — M542 Cervicalgia: Secondary | ICD-10-CM | POA: Diagnosis not present

## 2011-04-12 DIAGNOSIS — K219 Gastro-esophageal reflux disease without esophagitis: Secondary | ICD-10-CM | POA: Diagnosis not present

## 2011-04-12 DIAGNOSIS — E785 Hyperlipidemia, unspecified: Secondary | ICD-10-CM | POA: Diagnosis not present

## 2011-05-06 DIAGNOSIS — H353190 Nonexudative age-related macular degeneration, unspecified eye, stage unspecified: Secondary | ICD-10-CM | POA: Diagnosis not present

## 2011-05-09 DIAGNOSIS — L819 Disorder of pigmentation, unspecified: Secondary | ICD-10-CM | POA: Diagnosis not present

## 2011-05-09 DIAGNOSIS — Z0189 Encounter for other specified special examinations: Secondary | ICD-10-CM | POA: Diagnosis not present

## 2011-05-09 DIAGNOSIS — Z85828 Personal history of other malignant neoplasm of skin: Secondary | ICD-10-CM | POA: Diagnosis not present

## 2011-05-09 DIAGNOSIS — L821 Other seborrheic keratosis: Secondary | ICD-10-CM | POA: Diagnosis not present

## 2011-05-25 DIAGNOSIS — K219 Gastro-esophageal reflux disease without esophagitis: Secondary | ICD-10-CM | POA: Diagnosis not present

## 2011-07-04 ENCOUNTER — Ambulatory Visit: Payer: Self-pay | Admitting: Family Medicine

## 2011-07-04 DIAGNOSIS — R079 Chest pain, unspecified: Secondary | ICD-10-CM | POA: Diagnosis not present

## 2011-07-04 DIAGNOSIS — N200 Calculus of kidney: Secondary | ICD-10-CM | POA: Diagnosis not present

## 2011-07-04 DIAGNOSIS — K5909 Other constipation: Secondary | ICD-10-CM | POA: Diagnosis not present

## 2011-07-16 ENCOUNTER — Emergency Department: Payer: Self-pay | Admitting: *Deleted

## 2011-07-16 DIAGNOSIS — Z9079 Acquired absence of other genital organ(s): Secondary | ICD-10-CM | POA: Diagnosis not present

## 2011-07-16 DIAGNOSIS — E119 Type 2 diabetes mellitus without complications: Secondary | ICD-10-CM | POA: Diagnosis not present

## 2011-07-16 DIAGNOSIS — R109 Unspecified abdominal pain: Secondary | ICD-10-CM | POA: Diagnosis not present

## 2011-07-16 DIAGNOSIS — Z794 Long term (current) use of insulin: Secondary | ICD-10-CM | POA: Diagnosis not present

## 2011-07-16 DIAGNOSIS — I7090 Unspecified atherosclerosis: Secondary | ICD-10-CM | POA: Diagnosis not present

## 2011-07-16 DIAGNOSIS — R112 Nausea with vomiting, unspecified: Secondary | ICD-10-CM | POA: Diagnosis not present

## 2011-07-16 DIAGNOSIS — I10 Essential (primary) hypertension: Secondary | ICD-10-CM | POA: Diagnosis not present

## 2011-07-16 DIAGNOSIS — R52 Pain, unspecified: Secondary | ICD-10-CM | POA: Diagnosis not present

## 2011-07-16 LAB — CBC
HGB: 13.8 g/dL (ref 12.0–16.0)
MCH: 29.1 pg (ref 26.0–34.0)
MCHC: 33.7 g/dL (ref 32.0–36.0)
MCV: 87 fL (ref 80–100)
RBC: 4.75 10*6/uL (ref 3.80–5.20)
RDW: 13.3 % (ref 11.5–14.5)

## 2011-07-16 LAB — COMPREHENSIVE METABOLIC PANEL
Albumin: 3.4 g/dL (ref 3.4–5.0)
Anion Gap: 8 (ref 7–16)
Co2: 26 mmol/L (ref 21–32)
Creatinine: 1.04 mg/dL (ref 0.60–1.30)
Glucose: 188 mg/dL — ABNORMAL HIGH (ref 65–99)
Osmolality: 271 (ref 275–301)
Potassium: 4.2 mmol/L (ref 3.5–5.1)
SGPT (ALT): 24 U/L
Total Protein: 7.4 g/dL (ref 6.4–8.2)

## 2011-07-16 LAB — URINALYSIS, COMPLETE
Bilirubin,UR: NEGATIVE
Blood: NEGATIVE
Ketone: NEGATIVE
Leukocyte Esterase: NEGATIVE
Nitrite: NEGATIVE
Ph: 6 (ref 4.5–8.0)
Protein: NEGATIVE
RBC,UR: NONE SEEN /HPF (ref 0–5)
WBC UR: <1 /HPF (ref 0–5)

## 2011-07-19 DIAGNOSIS — K5900 Constipation, unspecified: Secondary | ICD-10-CM | POA: Diagnosis not present

## 2011-08-24 DIAGNOSIS — E119 Type 2 diabetes mellitus without complications: Secondary | ICD-10-CM | POA: Diagnosis not present

## 2011-08-29 DIAGNOSIS — K219 Gastro-esophageal reflux disease without esophagitis: Secondary | ICD-10-CM | POA: Diagnosis not present

## 2011-08-29 DIAGNOSIS — F411 Generalized anxiety disorder: Secondary | ICD-10-CM | POA: Diagnosis not present

## 2011-08-30 DIAGNOSIS — M6530 Trigger finger, unspecified finger: Secondary | ICD-10-CM | POA: Diagnosis not present

## 2011-09-06 DIAGNOSIS — H3530 Unspecified macular degeneration: Secondary | ICD-10-CM | POA: Diagnosis not present

## 2011-09-09 ENCOUNTER — Telehealth: Payer: Self-pay | Admitting: Cardiovascular Disease

## 2011-09-09 ENCOUNTER — Encounter: Payer: Self-pay | Admitting: *Deleted

## 2011-09-09 NOTE — Telephone Encounter (Signed)
Pt daughter called stating that pt is having some increased SOB and weak in the am. Cell [phone]

## 2011-09-09 NOTE — Telephone Encounter (Signed)
Daughter, Elnita Maxwell, calls today b/c pt has expressed feelings of "shortness of breath" the past couple of mornings. Shortness of breath occurs early in am and sometimes pt feels a little tired and weak. This has been going on over a month per daughter.  She did not take her xanax this morning. She frequently feels anxious. She took 1/2 xanax tablet last pm. Felt weak this morning.  She has also had a dry cough, no congestion noted by daughter.  Denies any edema, no weight gain, current weight today 160.   Current blood pressures prior to am medications:  08/31/11      91/48     HR  52       AFTER Meds: 107/62 09/01/11     111/36    HR  57 09/02/11     153/65    HR  59 09/04/11   174/57    HR  58    Pt was experiencing blurred vision. She had taken her medications                                                Pt has Macular Degeneration per daughter. She will follow-up with her pcp Dr. Juanetta Gosling. Pt has appt with Dr. Mariah Milling on 09/16/11 Mylo Red RN

## 2011-09-12 ENCOUNTER — Other Ambulatory Visit: Payer: Self-pay | Admitting: *Deleted

## 2011-09-12 MED ORDER — LOSARTAN POTASSIUM-HCTZ 100-25 MG PO TABS: 1.0000 | ORAL_TABLET | Freq: Every day | ORAL | Status: DC

## 2011-09-12 NOTE — Telephone Encounter (Signed)
Refilled Losartan

## 2011-09-16 ENCOUNTER — Ambulatory Visit (INDEPENDENT_AMBULATORY_CARE_PROVIDER_SITE_OTHER): Payer: Medicare Other | Admitting: Cardiovascular Disease

## 2011-09-16 ENCOUNTER — Encounter: Payer: Self-pay | Admitting: Cardiovascular Disease

## 2011-09-16 VITALS — BP 110/60 | HR 62 | Ht 62.0 in | Wt 162.0 lb

## 2011-09-16 DIAGNOSIS — R5383 Other fatigue: Secondary | ICD-10-CM | POA: Diagnosis not present

## 2011-09-16 DIAGNOSIS — E119 Type 2 diabetes mellitus without complications: Secondary | ICD-10-CM

## 2011-09-16 DIAGNOSIS — E785 Hyperlipidemia, unspecified: Secondary | ICD-10-CM

## 2011-09-16 DIAGNOSIS — I10 Essential (primary) hypertension: Secondary | ICD-10-CM

## 2011-09-16 DIAGNOSIS — R5381 Other malaise: Secondary | ICD-10-CM

## 2011-09-16 DIAGNOSIS — M549 Dorsalgia, unspecified: Secondary | ICD-10-CM

## 2011-09-16 DIAGNOSIS — R0602 Shortness of breath: Secondary | ICD-10-CM

## 2011-09-16 NOTE — Assessment & Plan Note (Signed)
Blood pressure is well controlled on today's visit. No changes made to the medications. 

## 2011-09-16 NOTE — Patient Instructions (Addendum)
You are doing well. No medication changes were made.  Please call us if you have new issues that need to be addressed before your next appt.  Your physician wants you to follow-up in: 6 months.  You will receive a reminder letter in the mail two months in advance. If you don't receive a letter, please call our office to schedule the follow-up appointment.   

## 2011-09-16 NOTE — Assessment & Plan Note (Signed)
She does not want a cholesterol medication. 

## 2011-09-16 NOTE — Assessment & Plan Note (Signed)
Chronic symptoms, likely musculoskeletal.

## 2011-09-16 NOTE — Assessment & Plan Note (Signed)
Insulin recently increased. Previous hemoglobin A1c greater than 8. We have encouraged continued exercise, careful diet management in an effort to lose weight.

## 2011-09-16 NOTE — Progress Notes (Signed)
Patient ID: Caroline Russo, female    DOB: Jun 18, 1919, 78 y.o.   MRN: [id number]  HPI Comments: Caroline Russo is a pleasant 78 year old woman with hypertension, nonobstructive coronary artery disease in 2002, diabetes, obesity who presents for routine followup   she has had chronic chest pain and back pain. The back pain appears to be musculoskeletal, comes on at night time while she is sleeping, hurts with certain movements and gradually resolves. She goes to massage therapist for relief. No recent chest pain. History of neck surgery in the past  She does not like to take cholesterol medication.  hemoglobin A1c of 8. She reports that Dr. Juanetta Gosling recently increased her insulin. She denies any dizziness   EKG shows normal sinus rhythm , rate 62 beats per minute, nonspecific  ST changes In the inferior leads, rare APCs       Outpatient Encounter Prescriptions as of 09/16/2011  Medication Sig Dispense Refill  . ALPRAZolam (XANAX) 0.25 MG tablet Take 0.25 mg by mouth as needed.        . Cholecalciferol (VITAMIN D) 400 UNITS capsule Take 400 Units by mouth daily.        . insulin detemir (LEVEMIR) 100 UNIT/ML injection Inject 36 Units into the skin 2 (two) times daily.       Marland Kitchen losartan-hydrochlorothiazide (HYZAAR) 100-25 MG per tablet Take 1 tablet by mouth daily.  30 tablet  6  . naproxen sodium (ANAPROX) 220 MG tablet Take 220 mg by mouth 2 (two) times daily with a meal.      . nitroGLYCERIN (NITROSTAT) 0.4 MG SL tablet Place 0.4 mg under the tongue every 5 (five) minutes as needed.        . vitamin B-12 (CYANOCOBALAMIN) 100 MCG tablet Take 50 mcg by mouth daily.        . vitamin E 100 UNIT capsule Take 100 Units by mouth daily.        . metoprolol succinate (TOPROL-XL) 50 MG 24 hr tablet Take 50 mg by mouth 2 (two) times daily.      . pantoprazole (PROTONIX) 40 MG tablet Take 1 tablet (40 mg total) by mouth 2 (two) times daily.  60 tablet  6  . DISCONTD: aspirin (ASPIR-81) 81 MG EC tablet Take  81 mg by mouth daily.        Marland Kitchen DISCONTD: Multiple Vitamins-Minerals (OCUVITE PRESERVISION) TABS Take by mouth daily.           Review of Systems  Constitutional: Negative.   HENT: Negative.   Eyes: Negative.   Respiratory: Negative.   Cardiovascular: Negative.   Gastrointestinal: Negative.   Musculoskeletal: Positive for back pain.  Skin: Negative.   Neurological: Negative.   Hematological: Negative.   Psychiatric/Behavioral: Negative.   All other systems reviewed and are negative.    BP 110/60  Pulse 62  Ht 5\' 2"  (1.575 m)  Wt 162 lb (73.483 kg)  BMI 29.63 kg/m2  Physical Exam  Nursing note and vitals reviewed. Constitutional: She is oriented to person, place, and time. She appears well-developed and well-nourished.  HENT:  Head: Normocephalic.  Nose: Nose normal.  Mouth/Throat: Oropharynx is clear and moist.  Eyes: Conjunctivae are normal. Pupils are equal, round, and reactive to light.  Neck: Normal range of motion. Neck supple. No JVD present.       incision on the left side of her neck is well healed  Cardiovascular: Normal rate, regular rhythm, S1 normal, S2 normal and intact distal pulses.  Exam  reveals no gallop and no friction rub.   Murmur heard.  Crescendo systolic murmur is present with a grade of 1/6  Pulmonary/Chest: Effort normal and breath sounds normal. No respiratory distress. She has no wheezes. She has no rales. She exhibits no tenderness.  Abdominal: Soft. Bowel sounds are normal. She exhibits no distension. There is no tenderness.  Musculoskeletal: Normal range of motion. She exhibits no edema and no tenderness.  Lymphadenopathy:    She has no cervical adenopathy.  Neurological: She is alert and oriented to person, place, and time. Coordination normal.  Skin: Skin is warm and dry. No rash noted. No erythema.  Psychiatric: She has a normal mood and affect. Her behavior is normal. Judgment and thought content normal.         Assessment and Plan

## 2011-10-21 DIAGNOSIS — G589 Mononeuropathy, unspecified: Secondary | ICD-10-CM | POA: Diagnosis not present

## 2011-10-21 DIAGNOSIS — K219 Gastro-esophageal reflux disease without esophagitis: Secondary | ICD-10-CM | POA: Diagnosis not present

## 2011-10-21 DIAGNOSIS — M1990 Unspecified osteoarthritis, unspecified site: Secondary | ICD-10-CM | POA: Diagnosis not present

## 2011-10-21 DIAGNOSIS — Z23 Encounter for immunization: Secondary | ICD-10-CM | POA: Diagnosis not present

## 2011-11-30 DIAGNOSIS — E119 Type 2 diabetes mellitus without complications: Secondary | ICD-10-CM | POA: Diagnosis not present

## 2011-12-05 DIAGNOSIS — I499 Cardiac arrhythmia, unspecified: Secondary | ICD-10-CM | POA: Diagnosis not present

## 2011-12-05 DIAGNOSIS — M1990 Unspecified osteoarthritis, unspecified site: Secondary | ICD-10-CM | POA: Diagnosis not present

## 2011-12-05 DIAGNOSIS — K219 Gastro-esophageal reflux disease without esophagitis: Secondary | ICD-10-CM | POA: Diagnosis not present

## 2011-12-05 DIAGNOSIS — E785 Hyperlipidemia, unspecified: Secondary | ICD-10-CM | POA: Diagnosis not present

## 2012-01-10 ENCOUNTER — Telehealth: Payer: Self-pay | Admitting: Cardiovascular Disease

## 2012-01-10 NOTE — Telephone Encounter (Signed)
Pt says she has had blurred vision x "several months" now and thinks it is r/t losartan She has not tried holding this med to see if it helps, yet she tells me BP 2 days ago was 186/64. She says she is not sure BP machine is accurate.  Before telling pt to hold losartan, I advised her to come in with her home BP monitor so we can see if it correlates with our manual cuff. She verb. Understanding and will come in tomm at 2 pm for BP check.  She says she is Diabetic but "blood sugar has been under good control". Last HgbA1c=7.0

## 2012-01-10 NOTE — Telephone Encounter (Signed)
lmtcb

## 2012-01-10 NOTE — Telephone Encounter (Signed)
Pt says that she is having really bad blurred vision and thinks that her losartan is causing it.

## 2012-01-11 ENCOUNTER — Ambulatory Visit (INDEPENDENT_AMBULATORY_CARE_PROVIDER_SITE_OTHER): Payer: Medicare Other

## 2012-01-11 VITALS — BP 148/56 | HR 68 | Ht 62.0 in

## 2012-01-11 DIAGNOSIS — I10 Essential (primary) hypertension: Secondary | ICD-10-CM | POA: Diagnosis not present

## 2012-01-11 NOTE — Progress Notes (Signed)
Pt here to compare home BP cuff with our manual cuff. She has been c/o blurred vision and believes it it r/t the losartan she takes on a daily basis. Has been having blurred vision x 3 months.  I was hesitant to tell her to hold losartan yesterday when she told me her BP was 186/61. I advised her to come in today to see if home BP monitor is accurate. Home BP cuff correlates closely with our reading I advised pt to try holding losartan x 2 days to see if this helps blurred vision. I advised her to check BP 2x daily while holding this and lket [place] know should BP become too elevated (SBP>150 mm hg) She says she went to eye specialist recently and was told she has macular degeneration in R eye but she says she has had this "for years" and does not think it is causing the blurred vision.

## 2012-01-11 NOTE — Patient Instructions (Addendum)
Hold losartan x 2 days Monitor blood pressure 2x daily Call us if systolic BP>150 mm hg without losratan. Call us in 2 days to report any change

## 2012-01-12 DIAGNOSIS — M6530 Trigger finger, unspecified finger: Secondary | ICD-10-CM | POA: Diagnosis not present

## 2012-01-13 ENCOUNTER — Telehealth: Payer: Self-pay

## 2012-01-13 ENCOUNTER — Other Ambulatory Visit: Payer: Self-pay

## 2012-01-13 MED ORDER — HYDROCHLOROTHIAZIDE 25 MG PO TABS: 25.0000 mg | ORAL_TABLET | Freq: Every day | ORAL | Status: DC

## 2012-01-13 NOTE — Telephone Encounter (Signed)
Pt has been holding the losartan per our instruction over the last few days for blurred vision (see office note from 01/10/12) She says blurred vision is better but BP is staying elevated without the losartan/HCTZ BP is as follows: 125/49, 152/73, 163/66, 170/59 HR=56,54, 57 I explained we should probably avoid increasing metoprolol d/t her low HRs I advised pt to try taking just the HCTZ portion of the losartan/HCTZ. She was taking losartan/HCTZ 100/25 qd I told her I would send in to pharmacy HCTZ 25 mg PO QD. I encouraged her to continue to monitor BP and HR over w/e and call us Monday to report She verb. Understanding New RX sent to pharmacy

## 2012-01-13 NOTE — Telephone Encounter (Signed)
Please see what I did

## 2012-01-16 ENCOUNTER — Telehealth: Payer: Self-pay | Admitting: *Deleted

## 2012-01-16 NOTE — Telephone Encounter (Signed)
Pt calls b/c Dr. Erby Pian started her on celebrex a week ago.  She has taken it now for 6 days. She read the insert and wanted to make sure this was okay to take with her other medications. She is not taking aleve. She also not on Hyzaar either. Mylo Red RN

## 2012-01-19 NOTE — Telephone Encounter (Signed)
Is this ok?

## 2012-01-19 NOTE — Telephone Encounter (Signed)
I think this should be okay 

## 2012-01-19 NOTE — Telephone Encounter (Signed)
Pt informed Says she is not taking celebrex d/t SE  She will notify Dr. Katrinka Blazing she couldn't tolerate this

## 2012-01-28 NOTE — Telephone Encounter (Signed)
Any follow up on bp? thx

## 2012-01-30 NOTE — Telephone Encounter (Signed)
I called pt to assess BP She says BP is better (SBP=140 mmhg) Has been taking HCTZ as prescribed but tells me she held this yesterday d/t "not feeling right" When I asked her to describe this she says she has had a "funny feeling in my chest", describing her heart trying to "go fast and then slow".  This sensation is felt above breasts but below neck. Denies CP Says she has been under an increased amount of stress lately, dtr (pts primary caregiver) was committed to behavioral health last week d/t suicide attempt Pt is concerned b/c she now does not want to "put too much on my daughter" but pt lives alone and sometimes gets "scared at night", etc Pt also confirms she has been having more cravings for coke lately and may have been drinking more caffeine than usual I explained both caffeine and stress can cause palpitations/this feeling she is having in chest Pt has appt with Dr. Mariah Milling in Feb for 6 month f/u but because she is having new symptoms I advised to come in sooner with [facility] Givens to be evaluated Understanding verb In meantime, I advised her to try to decrease amt of caffeine intake between now and appt Understanding verb

## 2012-01-30 NOTE — Telephone Encounter (Signed)
FYI

## 2012-02-01 ENCOUNTER — Ambulatory Visit (INDEPENDENT_AMBULATORY_CARE_PROVIDER_SITE_OTHER): Payer: Medicare Other | Admitting: Nurse Practitioner

## 2012-02-01 ENCOUNTER — Encounter: Payer: Self-pay | Admitting: Nurse Practitioner

## 2012-02-01 VITALS — BP 182/84 | HR 64 | Ht 62.0 in | Wt 164.5 lb

## 2012-02-01 DIAGNOSIS — R002 Palpitations: Secondary | ICD-10-CM | POA: Insufficient documentation

## 2012-02-01 DIAGNOSIS — I10 Essential (primary) hypertension: Secondary | ICD-10-CM

## 2012-02-01 NOTE — Patient Instructions (Addendum)
You will receive a 21 day event monitor by mail due to palpitations.  Stop HYDROCHLOROTHIAZIDE  Continue Losartan-Hydrochlorothiazide  You are to Follow up: 1 month with Dr. Mariah Milling

## 2012-02-01 NOTE — Progress Notes (Signed)
Patient Name: Caroline Russo Date of Encounter: 02/01/2012  Primary Care Provider:  Park Pope, MD Primary Cardiologist:  Concha Se, MD  Patient Profile  79 year old female with history of chest pain and nonobstructive CAD who presents secondary to palpitations.  Problem List   Past Medical History  Diagnosis Date  . HTN (hypertension)   . DM2 (diabetes mellitus, type 2)     insulin requiring  . Hypercholesterolemia   . Murmur     hx  . Tinnitus     chronic  . Macular degeneration   . Chronic chest pain     a. nonobs cath 2002.  Marland Kitchen Palpitations     a. 01/2011   Past Surgical History  Procedure Date  . Cardiac catheterization 2002  . Tumor removal 2012    benign neck tumor removed    Allergies  No Known Allergies  HPI  79 year old female with the above problem list. Approximately 3 weeks ago she began to note some blurring of her vision. She read up on her medicine and this could be associated with losartan. She called in to the office in her losartan HCTZ was placed on hold and a prescription for HCTZ by itself was called in. She has not noticed any significant change in her vision since discontinuation of losartan. Her blood pressure and has been running in the 160s to 170s at home. Over the past 3 weeks, she has been noting intermittent midsternal burning, somewhat similar to GERD-like symptoms, occurring 2-3 times per week, associated with palpitations that she describes as "a vibration in her chest," associated with anxiety and relieved approximately 30-45 minutes after taking his Xanax. When these episodes occur she does not have presyncope, syncope, or dyspnea. Symptoms do not appear to be associated with meals or activity. The symptoms are different from prior chest pains. Of note, she has not been taking her Protonix.  Home Medications  Prior to Admission medications   Medication Sig Start Date End Date Taking? Authorizing Provider  ALPRAZolam (XANAX) 0.25  MG tablet Take 0.25 mg by mouth as needed.     Yes Historical Provider, MD  celecoxib (CELEBREX) 200 MG capsule Take 200 mg by mouth daily.   Yes Historical Provider, MD  Cholecalciferol (VITAMIN D) 400 UNITS capsule Take 400 Units by mouth daily.     Yes Historical Provider, MD  insulin detemir (LEVEMIR) 100 UNIT/ML injection Inject 36 Units into the skin 2 (two) times daily.    Yes Historical Provider, MD  losartan-hydrochlorothiazide (HYZAAR) 100-25 MG per tablet Take 1 tablet by mouth daily. Pt states this medication is on hold/dfg 09/12/11 09/11/12 Yes Antonieta Iba, MD  metoprolol succinate (TOPROL-XL) 25 MG 24 hr tablet Take 25 mg by mouth 2 (two) times daily.   Yes Historical Provider, MD  nitroGLYCERIN (NITROSTAT) 0.4 MG SL tablet Place 0.4 mg under the tongue every 5 (five) minutes as needed.     Yes Historical Provider, MD  vitamin B-12 (CYANOCOBALAMIN) 100 MCG tablet Take 50 mcg by mouth daily.     Yes Historical Provider, MD  vitamin E 100 UNIT capsule Take 100 Units by mouth daily.     Yes Historical Provider, MD  pantoprazole (PROTONIX) 40 MG tablet Take 1 tablet (40 mg total) by mouth 2 (two) times daily. 07/12/10 07/12/11  Antonieta Iba, MD    Review of Systems  Palpitations ("vibrations") and anxiety as outlined above.  Otw she is doing well and denies pnd, orthopnea, c/p, doe, n,  v, dizziness, syncope, or edema.  All other systems reviewed and are otherwise negative except as noted above.  Physical Exam  Blood pressure 182/84, pulse 64, height 5\' 2"  (1.575 m), weight 164 lb 8 oz (74.617 kg).  General: Pleasant, NAD Psych: Normal affect. Neuro: Alert and oriented X 3. Moves all extremities spontaneously. HEENT: Normal  Neck: Supple without bruits or JVD. Lungs:  Resp regular and unlabored, CTA. Heart: RRR no s3, s4, or murmurs. Abdomen: Soft, non-tender, non-distended, BS + x 4.  Extremities: No clubbing, cyanosis or edema. DP/PT/Radials 2+ and equal  bilaterally.  Accessory Clinical Findings  ECG - rsr, 64, no acute st/t changes.  Assessment & Plan  1.  Palpitations: Patient reports a three-week history of intermittent palpitations associated with anxiety and a mild burning, indigestion-like sensation in her chest. Symptoms are different than prior chest pains and are not associated with dyspnea, presyncope, or syncope. She has not been taking Protonix on a daily basis and have recommended that she do so. I have also arranged for a 21 day event monitor to rule out arrhythmia. We will obtain a basic metabolic profile and TSH she returns to pick up her monitor.  2. Hypertension: Patient has come off of losartan because of some blurring of vision although she has not noticed any improvement in her vision. Her blood pressure initially today was 182/84 and then 160/90 on repeat. We will resume her losartan HCTZ and in doing so discontinue the stand-alone HCTZ dose that she had been taking over the past 3 weeks. She follows her blood pressure closely at home.  3. Disposition: Arrange for 21 day event monitor, bmet, and TSH, and followup in approximately 4 weeks.  Nicolasa Ducking, NP 02/01/2012, 4:13 PM

## 2012-02-02 ENCOUNTER — Ambulatory Visit: Payer: Medicare Other

## 2012-02-02 DIAGNOSIS — R002 Palpitations: Secondary | ICD-10-CM

## 2012-02-03 ENCOUNTER — Telehealth: Payer: Self-pay

## 2012-02-03 NOTE — Telephone Encounter (Signed)
Is pt cleared for dental surg- laser surg on gums?

## 2012-02-03 NOTE — Telephone Encounter (Signed)
Pt called to say she is receiving event monitor Monday 1/13 Says she is having laser surg on gums 1/16 with Dr. Donell Beers She is concerned b/c she says they told her she would need to "take off monitor 2 days before procedure" She does not understand why I told her I would call them for her and call her back  I called dr. Arita Miss office and spoke with Rivka Barbara She says they did not tell pt she would have to take monitor off. She says they told her "if she is wearing a monitor, she must have heart problems and may need clearance" Glenda confirmed she is ok to wear monitor during procedure Glenda requests cardiac clearance to do laser surg on gums I told her we would fax to Dr. Donell Beers at [phone]  I then called pt back to reassure her ok to wear monitor during procedure and we will fax clearance to Dr. Donell Beers Understanding verb

## 2012-02-06 NOTE — Telephone Encounter (Signed)
Letter faxed to # provided 

## 2012-02-06 NOTE — Telephone Encounter (Signed)
Acceptable risk for surgery No further testing needed Okay to hold the monitor for the procedure, restart after

## 2012-02-07 ENCOUNTER — Telehealth: Payer: Self-pay

## 2012-02-07 NOTE — Telephone Encounter (Signed)
I called [facility] and was told new ship date is Friday 02/10/12

## 2012-02-07 NOTE — Telephone Encounter (Signed)
I called pt back and explained her monitor should arrive Friday 1/17 She tells me BP is better since this am=162/54 Denies worsening sob, cp, dizziness C/o feeling "shaky" BS this am=107 I had her check BS again while on phone with me. It was 220 She confirms compliance with meds, but says insulin was recently decreased from 36U to 34U She will take 36U now I explained her shakiness could be from high BS She will take insulin as prescribed by Dr. Juanetta Gosling and will let me know should symptoms not improve

## 2012-02-07 NOTE — Telephone Encounter (Signed)
I called pt to remind her we need to check labs (BMP, TSH) per Gilford Raid last note She says she can come tomm at 1000-1100 She says she has not received EM yet I will call to check on this Also mentions another episode of burning in chest and arms last night This was relieved after getting out of bed and taking TUMS Bp this am=155/103, HR=49 Says she took metoprolol as prescribed but has yet to take losartan I advised her to go ahead and take this since BP is high. She will go ahead and take losartan She will check BP again in 2 hours and I will call her back between 1:30 and 2 pm today I will let her know what I find out about monitor at that time as well She is asymptomatic this am but wants to know "why I have these burning spells" I will review with Dr. Mariah Milling as well

## 2012-02-08 ENCOUNTER — Ambulatory Visit (INDEPENDENT_AMBULATORY_CARE_PROVIDER_SITE_OTHER): Payer: Medicare Other

## 2012-02-08 DIAGNOSIS — R002 Palpitations: Secondary | ICD-10-CM

## 2012-02-08 DIAGNOSIS — Z79899 Other long term (current) drug therapy: Secondary | ICD-10-CM | POA: Diagnosis not present

## 2012-02-09 LAB — BASIC METABOLIC PANEL
BUN: 15 mg/dL (ref 10–36)
Chloride: 95 mmol/L — ABNORMAL LOW (ref 97–108)
GFR calc Af Amer: 55 mL/min/{1.73_m2} — ABNORMAL LOW (ref >59)
GFR calc non Af Amer: 48 mL/min/{1.73_m2} — ABNORMAL LOW (ref >59)
Glucose: 256 mg/dL — ABNORMAL HIGH (ref 65–99)

## 2012-02-09 LAB — TSH: TSH: 2.52 u[IU]/mL (ref 0.450–4.500)

## 2012-02-17 ENCOUNTER — Ambulatory Visit (INDEPENDENT_AMBULATORY_CARE_PROVIDER_SITE_OTHER): Payer: Medicare Other

## 2012-02-17 ENCOUNTER — Telehealth: Payer: Self-pay

## 2012-02-17 DIAGNOSIS — R002 Palpitations: Secondary | ICD-10-CM | POA: Diagnosis not present

## 2012-02-17 NOTE — Telephone Encounter (Signed)
Pt says she received e [facility] event monitor via mail last week Says it is too difficult to set up and has tried calling [facility] and is not pleased with support she is getting from them Still does not understand how to apply this She wants to send monitor back I advised her to come to our office today so we can help her apply this She will do this today after 2 pm

## 2012-02-17 NOTE — Progress Notes (Signed)
[facility] event monitor applied  Reviewed process and answered many questions from pt re: monitor Also wrote out specific instructions for pt Called [facility] while pt in office to tell them pt is hooked up

## 2012-02-29 ENCOUNTER — Telehealth: Payer: Self-pay | Admitting: *Deleted

## 2012-02-29 NOTE — Telephone Encounter (Signed)
Pt called stating that she is wearing a monitor and her monitor gave her an error message. Pt upset about having to wear monitor and dont want to have to deal with problems. Pt unplugged monitor.

## 2012-02-29 NOTE — Telephone Encounter (Signed)
Pt wants [place] to know she is sending [employer] monitor back States, "this is tearing my nerves up!"  Says she got error message re: "SD card error". She is not sure what this meant and she tried calling customer support # but could not get through to anyone. She is taking monitor off and mailing back to [employer]. She denies further symptoms of burning in chest. Says she is now taking a PPI BID which has helped symptoms. She has appt with Dr. Mariah Milling 2/13 to follow up and will keep appt

## 2012-03-05 ENCOUNTER — Other Ambulatory Visit: Payer: Self-pay | Admitting: *Deleted

## 2012-03-05 MED ORDER — PANTOPRAZOLE SODIUM 40 MG PO TBEC: 40.0000 mg | DELAYED_RELEASE_TABLET | Freq: Two times a day (BID) | ORAL | Status: DC

## 2012-03-05 NOTE — Telephone Encounter (Signed)
Refilled Pantoprazole sent to [facility].

## 2012-03-06 ENCOUNTER — Encounter: Payer: Self-pay | Admitting: Cardiovascular Disease

## 2012-03-06 ENCOUNTER — Ambulatory Visit (INDEPENDENT_AMBULATORY_CARE_PROVIDER_SITE_OTHER): Payer: Medicare Other | Admitting: Cardiovascular Disease

## 2012-03-06 VITALS — BP 156/76 | HR 88 | Ht 62.5 in | Wt 162.0 lb

## 2012-03-06 DIAGNOSIS — E119 Type 2 diabetes mellitus without complications: Secondary | ICD-10-CM | POA: Diagnosis not present

## 2012-03-06 DIAGNOSIS — R002 Palpitations: Secondary | ICD-10-CM

## 2012-03-06 DIAGNOSIS — R000 Tachycardia, unspecified: Secondary | ICD-10-CM | POA: Diagnosis not present

## 2012-03-06 DIAGNOSIS — I10 Essential (primary) hypertension: Secondary | ICD-10-CM

## 2012-03-06 DIAGNOSIS — E785 Hyperlipidemia, unspecified: Secondary | ICD-10-CM

## 2012-03-06 MED ORDER — HYDRALAZINE HCL 50 MG PO TABS: 50.0000 mg | ORAL_TABLET | Freq: Three times a day (TID) | ORAL | Status: DC | PRN

## 2012-03-06 NOTE — Assessment & Plan Note (Signed)
She's not eager to take a statin. We have recommended diet and exercise

## 2012-03-06 NOTE — Assessment & Plan Note (Signed)
We have encouraged continued exercise, careful diet management in an effort to lose weight. 

## 2012-03-06 NOTE — Progress Notes (Signed)
Monitor placed in office by Sabino Snipes, R.N. for 30 day [employer] monitor on February 17, 2012.

## 2012-03-06 NOTE — Assessment & Plan Note (Signed)
Monitor shows frequent APCs. We'll continue beta blocker as she is taking.

## 2012-03-06 NOTE — Patient Instructions (Addendum)
You are doing well. Monitor your blood pressure, particularly in the afternoon If it runs high in the afternoon, cut the losartan hctz, take 1/2 in the am, 1/2 in the PM  If it continues to run high, call the office   If your blood pressure runs more than 160, take emergency pill (hydralazine 1 pill)  Please call us if you have new issues that need to be addressed before your next appt.  Your physician wants you to follow-up in: 3 months.  You will receive a reminder letter in the mail two months in advance. If you don't receive a letter, please call our office to schedule the follow-up appointment.

## 2012-03-06 NOTE — Progress Notes (Signed)
Patient could not wear the whole time.

## 2012-03-06 NOTE — Assessment & Plan Note (Signed)
We've recommended that she stay on her metoprolol succinate 25 mg twice a day, continued losartan HCT 100/12.5 mg daily ( she could take this one half tab twice a day to see if this helps her late p.m. Blood pressures). If blood pressure continues to run high, we have suggested she take hydralazine 50 mg when necessary. She will monitor her blood pressure and write these down for further medication adjustment.  Her last several visits, blood pressure has been elevated. I suspect she will need a blood pressure medication to take on a regular basis.

## 2012-03-06 NOTE — Progress Notes (Signed)
Patient ID: Caroline Russo, female    DOB: [birth year], 79 y.o.   MRN: [id number]  HPI Comments: Caroline Russo is a pleasant 79 year old woman with hypertension, nonobstructive coronary artery disease in 2002, diabetes, obesity who presents for routine followup.   She was seen one month ago for palpitations and event monitor was ordered.  Event monitor shows occasional sinus bradycardia., Normal sinus rhythm mostly with frequent APCs. No other significant arrhythmia   blood pressure has been running high in the afternoons.she does have significant stress, feels better after Xanax. She's not walking is much, weight is slowly increasing. She use to walk several miles a day with her husband. Legs are getting weaker   she has had chronic chest pain and back pain, felt to be musculoskeletal. No recent chest pain. History of neck surgery in the past.  EKG shows normal sinus rhythm , rate 88 beats per minute, no significant ST or T wave changes, APCs       Outpatient Encounter Prescriptions as of 03/06/2012  Medication Sig Dispense Refill  . ALPRAZolam (XANAX) 0.25 MG tablet Take 0.25 mg by mouth as needed.        . Cholecalciferol (VITAMIN D) 400 UNITS capsule Take 400 Units by mouth daily.        . insulin detemir (LEVEMIR) 100 UNIT/ML injection Inject 34 Units into the skin 2 (two) times daily.       Marland Kitchen losartan-hydrochlorothiazide (HYZAAR) 100-25 MG per tablet Take 1 tablet by mouth daily.       . metoprolol succinate (TOPROL-XL) 25 MG 24 hr tablet Take 25 mg by mouth 2 (two) times daily.      . Multiple Vitamins-Minerals (CENTRUM SILVER PO) Take by mouth daily.      . Multiple Vitamins-Minerals (ICAPS PO) Take 2 tablets by mouth daily.      . nitroGLYCERIN (NITROSTAT) 0.4 MG SL tablet Place 0.4 mg under the tongue every 5 (five) minutes as needed.        . pantoprazole (PROTONIX) 40 MG tablet Take 1 tablet (40 mg total) by mouth 2 (two) times daily.  60 tablet  3  . SENNA CO 2 tablets by  Combination route daily.      . vitamin E 100 UNIT capsule Take 100 Units by mouth daily.          Review of Systems  Constitutional: Negative.   HENT: Negative.   Eyes: Negative.   Respiratory: Negative.   Cardiovascular: Positive for palpitations.  Gastrointestinal: Negative.   Skin: Negative.   Neurological: Negative.   Psychiatric/Behavioral: Negative.   All other systems reviewed and are negative.    BP 156/76  Pulse 88  Ht 5' 2.5" (1.588 m)  Wt 162 lb (73.483 kg)  BMI 29.14 kg/m2  Physical Exam  Nursing note and vitals reviewed. Constitutional: She is oriented to person, place, and time. She appears well-developed and well-nourished.  HENT:  Head: Normocephalic.  Nose: Nose normal.  Mouth/Throat: Oropharynx is clear and moist.  Eyes: Conjunctivae are normal. Pupils are equal, round, and reactive to light.  Neck: Normal range of motion. Neck supple. No JVD present.  Cardiovascular: Normal rate, regular rhythm, S1 normal, S2 normal and intact distal pulses.  Exam reveals no gallop and no friction rub.   Murmur heard.  Crescendo systolic murmur is present with a grade of 1/6  Pulmonary/Chest: Effort normal and breath sounds normal. No respiratory distress. She has no wheezes. She has no rales. She exhibits no tenderness.  Abdominal: Soft. Bowel sounds are normal. She exhibits no distension. There is no tenderness.  Musculoskeletal: Normal range of motion. She exhibits no edema and no tenderness.  Lymphadenopathy:    She has no cervical adenopathy.  Neurological: She is alert and oriented to person, place, and time. Coordination normal.  Skin: Skin is warm and dry. No rash noted. No erythema.  Psychiatric: She has a normal mood and affect. Her behavior is normal. Judgment and thought content normal.    Assessment and Plan

## 2012-03-07 ENCOUNTER — Ambulatory Visit: Payer: Medicare Other | Admitting: Cardiovascular Disease

## 2012-03-07 ENCOUNTER — Other Ambulatory Visit: Payer: Self-pay

## 2012-03-07 DIAGNOSIS — R002 Palpitations: Secondary | ICD-10-CM

## 2012-03-08 ENCOUNTER — Ambulatory Visit: Payer: Medicare Other | Admitting: Cardiovascular Disease

## 2012-03-19 ENCOUNTER — Telehealth: Payer: Self-pay | Admitting: *Deleted

## 2012-03-19 ENCOUNTER — Ambulatory Visit (INDEPENDENT_AMBULATORY_CARE_PROVIDER_SITE_OTHER): Payer: Medicare Other

## 2012-03-19 VITALS — BP 140/64 | HR 90 | Ht 62.5 in | Wt 162.8 lb

## 2012-03-19 MED ORDER — METOPROLOL SUCCINATE ER 25 MG PO TB24: 25.0000 mg | ORAL_TABLET | Freq: Two times a day (BID) | ORAL | Status: DC

## 2012-03-19 NOTE — Telephone Encounter (Signed)
Pt says her BP machine keeps reading "error" and she does not know if this means it is too high/too low She was having some blurred vision yesterday [prior to a meal and "did not feel well" After she ate, she felt better but still had the blurred vision I explained this could be r/t low blood sugar, etc She was unaware of her blood sugar at the time Blood sugar this am was 127 I advised her to come to office for BP check  Coming in now

## 2012-03-19 NOTE — Telephone Encounter (Signed)
Spoke with pt mentioned that she is feeling tired and worn out no c/p, shortness of breath, vision blurry. Pt has not taken her medications this morning only her Diabetes medication. She has been having issues with her BP machine placed new batteries and it says error then she took BP and she mentioned that her BP 105/44 HR: N/A which she says that it is not normal for her to be normal. Please advise. [phone]

## 2012-03-19 NOTE — Progress Notes (Signed)
Pt voices anxiety and frustration re: inability to get BP machine to read BPs all the time Says it always reads "error" and this makes her anxious thus increasing her BP dtr is present and states pt gets very worried and anxious about thi8s Says she gave pt 1/2 tablet of xanax and pt was able to relax and BP was WNL  Pt voices fear of getting addicted to xanax. This has been prescribed to her by PCP. I advised her to take as prescribed by PCP I reassured pt, based on BPs today and BP readings in memory of home BP monitor, BP is much better I advised her to take meds as prescribed by Dr. Mariah Milling  She voices concern about her occassional blurred vision She does not have an endocrinologist Dtr says HgbA1c and been high the last few times this was checked and thinks her mom needs an endocrinologist I expolained sometimes vision changes can be secondary to DM and should have this closely managed She has appt with PCP tomm for 3 month f/u and will discuss with him as well  They ask for referral to endocrin. Will refer to Dr. Tedd Sias at [facility] and call her with appt  I will have Dr. Mariah Milling review EKG performed and call her back with any changes, otherwise, she will stay on current regimen and f/u with endocrinology and PCP  appt made with Dr. Tedd Sias 3/13 at 1045

## 2012-03-21 NOTE — Telephone Encounter (Signed)
Agree with checking blood pressure in the office

## 2012-04-23 DIAGNOSIS — F411 Generalized anxiety disorder: Secondary | ICD-10-CM | POA: Diagnosis not present

## 2012-04-23 DIAGNOSIS — K219 Gastro-esophageal reflux disease without esophagitis: Secondary | ICD-10-CM | POA: Diagnosis not present

## 2012-04-24 ENCOUNTER — Telehealth: Payer: Self-pay

## 2012-04-24 MED ORDER — LOSARTAN POTASSIUM-HCTZ 100-25 MG PO TABS: ORAL_TABLET | ORAL | Status: DC

## 2012-04-24 NOTE — Telephone Encounter (Signed)
Pt says she went to PCP yesterday and BP was high in office so Dr. Juanetta Gosling started pt on amlodipine 2.5 mg PO QD Pt does not understand why she has to take this and does not want "anymore pills!" She says Home BP this am=110/57 Says BPs at home have been "good" I explained ok to hold off on starting amlodipine for now but should monitor BP at home and keep log of this If she is continually getting high BP readings, she should call our office to see about med changes She is seeing Dr. Juanetta Gosling again in 1 month and will take log with her to that appt as well

## 2012-04-24 NOTE — Telephone Encounter (Signed)
Refilled Losartan HCTZ sent to [facility] pharmacy.

## 2012-04-24 NOTE — Telephone Encounter (Signed)
Pt called and has a question about some medications her PCP wants her to start taking, but wants to get Dr. Windell Hummingbird opinion before she starts taking them.

## 2012-04-24 NOTE — Telephone Encounter (Signed)
FYI

## 2012-05-09 DIAGNOSIS — L819 Disorder of pigmentation, unspecified: Secondary | ICD-10-CM | POA: Diagnosis not present

## 2012-05-09 DIAGNOSIS — L821 Other seborrheic keratosis: Secondary | ICD-10-CM | POA: Diagnosis not present

## 2012-05-09 DIAGNOSIS — Z0189 Encounter for other specified special examinations: Secondary | ICD-10-CM | POA: Diagnosis not present

## 2012-06-25 ENCOUNTER — Ambulatory Visit: Payer: Self-pay | Admitting: Family Medicine

## 2012-06-25 DIAGNOSIS — M19029 Primary osteoarthritis, unspecified elbow: Secondary | ICD-10-CM | POA: Diagnosis not present

## 2012-06-25 DIAGNOSIS — S4980XA Other specified injuries of shoulder and upper arm, unspecified arm, initial encounter: Secondary | ICD-10-CM | POA: Diagnosis not present

## 2012-06-25 DIAGNOSIS — M25519 Pain in unspecified shoulder: Secondary | ICD-10-CM | POA: Diagnosis not present

## 2012-06-25 DIAGNOSIS — S46909A Unspecified injury of unspecified muscle, fascia and tendon at shoulder and upper arm level, unspecified arm, initial encounter: Secondary | ICD-10-CM | POA: Diagnosis not present

## 2012-07-02 DIAGNOSIS — S4980XA Other specified injuries of shoulder and upper arm, unspecified arm, initial encounter: Secondary | ICD-10-CM | POA: Diagnosis not present

## 2012-07-02 DIAGNOSIS — E119 Type 2 diabetes mellitus without complications: Secondary | ICD-10-CM | POA: Diagnosis not present

## 2012-07-02 DIAGNOSIS — I10 Essential (primary) hypertension: Secondary | ICD-10-CM | POA: Diagnosis not present

## 2012-07-02 DIAGNOSIS — K219 Gastro-esophageal reflux disease without esophagitis: Secondary | ICD-10-CM | POA: Diagnosis not present

## 2012-07-06 ENCOUNTER — Telehealth: Payer: Self-pay | Admitting: *Deleted

## 2012-07-06 NOTE — Telephone Encounter (Signed)
fyi

## 2012-07-06 NOTE — Telephone Encounter (Signed)
Patient went to see her PCP Dr. Juanetta Gosling for a pulled muscle and her bp was up and put her on a new medicine.... Isosorbide 5mg . She wants to know if Dr. Mariah Milling approves

## 2012-07-07 NOTE — Telephone Encounter (Signed)
Ok medications Would continue to monitor bp at home

## 2012-07-09 NOTE — Telephone Encounter (Signed)
[facility]ic[facility] pharmacy aware

## 2012-07-09 NOTE — Telephone Encounter (Signed)
Pt informed Says shes not going to take this medication b/c it was only high at md office Says bp at home is good (sbp=120-130) She will discuss this with him

## 2012-07-11 DIAGNOSIS — Z794 Long term (current) use of insulin: Secondary | ICD-10-CM | POA: Diagnosis not present

## 2012-07-18 DIAGNOSIS — E119 Type 2 diabetes mellitus without complications: Secondary | ICD-10-CM | POA: Diagnosis not present

## 2012-07-19 DIAGNOSIS — H353190 Nonexudative age-related macular degeneration, unspecified eye, stage unspecified: Secondary | ICD-10-CM | POA: Diagnosis not present

## 2012-07-25 ENCOUNTER — Other Ambulatory Visit: Payer: Self-pay

## 2012-07-25 MED ORDER — METOPROLOL SUCCINATE ER 25 MG PO TB24: 25.0000 mg | ORAL_TABLET | Freq: Two times a day (BID) | ORAL | Status: DC

## 2012-07-25 NOTE — Telephone Encounter (Signed)
Refill sent for metoprolol.  

## 2012-08-20 ENCOUNTER — Telehealth: Payer: Self-pay

## 2012-08-20 ENCOUNTER — Ambulatory Visit (INDEPENDENT_AMBULATORY_CARE_PROVIDER_SITE_OTHER): Payer: Medicare Other | Admitting: Physician Assistant

## 2012-08-20 ENCOUNTER — Encounter: Payer: Self-pay | Admitting: Physician Assistant

## 2012-08-20 VITALS — BP 112/64 | HR 57 | Ht 62.0 in | Wt 159.2 lb

## 2012-08-20 DIAGNOSIS — I951 Orthostatic hypotension: Secondary | ICD-10-CM

## 2012-08-20 DIAGNOSIS — I10 Essential (primary) hypertension: Secondary | ICD-10-CM

## 2012-08-20 DIAGNOSIS — R002 Palpitations: Secondary | ICD-10-CM | POA: Diagnosis not present

## 2012-08-20 DIAGNOSIS — R42 Dizziness and giddiness: Secondary | ICD-10-CM

## 2012-08-20 MED ORDER — LOSARTAN POTASSIUM 100 MG PO TABS: 100.0000 mg | ORAL_TABLET | Freq: Every day | ORAL | Status: DC

## 2012-08-20 NOTE — Assessment & Plan Note (Signed)
Frequent PACs on prior Holter monitoring. EKG confirms PACs today. Suspect increased frequency secondary to dehydration +/- mild azotemia. Will continue BB and advised to monitor while patient is re-hydrating.

## 2012-08-20 NOTE — Assessment & Plan Note (Signed)
The patient reports frequent episodes of positional lightheadedness/presyncope on standing from a seated or laying position. She recently had a bout of loose stools this past Saturday after taking twice as many Senna tablets. Objectively, orthostatic VS are positive (SBP drop from 170 laying to 112 standing). Suspect the patient is dehydrated on a background of likely autonomic insufficiency with underlying DM2, diuretic use and elderly age. Will pursue a trial of HCTZ withdrawal, resume ARB alone for renoprotective and BP benefits. Advised to wear compression stockings, increase fluid intake +/- salt as needed for presyncope and stretching exercises/rising gradually prior to standing. Patient will resume previously scheduled follow-up appointment in two weeks with Dr. Mariah Milling. Will reassess orthostasis at that time.

## 2012-08-20 NOTE — Telephone Encounter (Signed)
Patient's daughter is having problems getting the patient's blood pressure on BP monitor, keeps showing an error on BP monitor with irreg heart beats. The patient fell yesterday and hit her head, she had been dizzy. Please advise what to do.

## 2012-08-20 NOTE — Telephone Encounter (Signed)
Pt's daughter reports pt was lightheaded yesterday, fell secondary to legs giving out and dizziness. Unable to get BP to register on home monitor.  Daughter reports HR irregular upon palpation.  Reports mother feeling weak and dizzy today.  Pt's daughter has given Hydralazine which is ordered prn for HBP yesterday pm and today am fearing pt's BP is elevated although it will not read on home monitor.  Made appt today for pt with Alinda Money, PA at 2pm.  Pt's daughter expresses understanding and agrees with plan.

## 2012-08-20 NOTE — Progress Notes (Signed)
Patient ID: Caroline Russo, female   DOB: 09-Mar-1919, 79 y.o.   MRN: [id number]            Date:  08/20/2012   ID:  Caroline Russo, DOB Sep 19, 1919, MRN [id number]  PCP:  Park Pope, MD  Primary Cardiologist:  Concha Se, MD   History of Present Illness: Caroline Russo is a delightful 79 y.o. female with PMHx s/f nonobstructive CAD (on 2002 cath), DM2, HTN, HLD, obesity, palpitations attributed to frequent PACs and sinus bradycardia on recent Holter monitoring who presents today as a work in visit for irregular heart beat, lightheadedness and recent fall.   She followed up with Ward Givens, NP in 01/2012 for chest pain and palpitations. Chest pain was non-exertional and felt to be related to GERD. She had not been taking Protonix and was advised to do so. 21-day Holter monitor was performed and revealed episodes of sinus bradycardia and frequent PACs. TSH and BMET were unremarkable.   She followed up with Dr. Mariah Milling the following month and Holter monitor findings were reviewed. Continued BB therapy was recommended. BP was noted to be elevated for which hydralazine 50mg  PO PRN was advised.   Speaking with the patient, she is quite spry and independent with in tact congnition. She is a accompanied by her daughters today. She reports being in her USOH until Saturday. She had an episode of loose stools-- she took an extra 2 tablets of Senna for constipation. She had a large, loose bowel movement. Since that time, she reports experiencing intermittent bouts of lightheadedness when standing from a laying/seated position. She had an episode more recently when she stumbled, caught herself on a bedpost. The bedpost did graze the side of her face. No neurological deficits, bruising or tenderness. She did not lose consciousness. She reports experiencing increased frequency of palpitations described as "skipped beats." She denies chest pain, SOB/DOE, PND, orthopnea or LE edema.   EKG: NSR, 83 bpm, frequent PACs,  LAD, no ST/T changes  ORTHOSTATIC VITAL SIGNS:  Laying 170/62, HR 78  Sitting 160/64, HR 97 Standing 132/58, 134/60, 112/64, HR 57-60  Wt Readings from Last 3 Encounters:  08/20/12 159 lb 4 oz (72.235 kg)  03/19/12 162 lb 12 oz (73.823 kg)  03/06/12 162 lb (73.483 kg)     Past Medical History  Diagnosis Date  . HTN (hypertension)   . DM2 (diabetes mellitus, type 2)     insulin requiring  . Hypercholesterolemia   . Murmur     hx  . Tinnitus     chronic  . Macular degeneration   . Chronic chest pain     a. nonobs cath 2002.  Marland Kitchen Palpitations     a. 01/2011 Holter monitor showed frequent PACs, sinus bradycardia    Current Outpatient Prescriptions  Medication Sig Dispense Refill  . ALPRAZolam (XANAX) 0.25 MG tablet Take 0.25 mg by mouth as needed.        . hydrALAZINE (APRESOLINE) 50 MG tablet Take 1 tablet (50 mg total) by mouth 3 (three) times daily as needed.  90 tablet  6  . insulin detemir (LEVEMIR) 100 UNIT/ML injection Inject 36 Units into the skin 2 (two) times daily.       Marland Kitchen losartan-hydrochlorothiazide (HYZAAR) 100-25 MG per tablet Takes 1/2 tablet am and 1/2 tablet pm by mouth daily.  30 tablet  3  . meclizine (ANTIVERT) 12.5 MG tablet Take 12.5 mg by mouth 3 (three) times daily as needed.      . metoprolol  succinate (TOPROL-XL) 25 MG 24 hr tablet Take 1 tablet (25 mg total) by mouth 2 (two) times daily.  60 tablet  6  . Multiple Vitamins-Minerals (CENTRUM SILVER PO) Take by mouth daily.      . Multiple Vitamins-Minerals (ICAPS PO) Take 2 tablets by mouth daily.      . nitroGLYCERIN (NITROSTAT) 0.4 MG SL tablet Place 0.4 mg under the tongue every 5 (five) minutes as needed.        . pantoprazole (PROTONIX) 40 MG tablet Take 1 tablet (40 mg total) by mouth 2 (two) times daily.  60 tablet  3  . SENNA CO 2 tablets by Combination route daily.       No current facility-administered medications for this visit.    Allergies:    Allergies  Allergen Reactions  .  Penicillins     Itching  Rash   . Prednisone     Social History:  The patient  reports that she has quit smoking. Her smoking use included Cigarettes. She has a 2.5 pack-year smoking history. She does not have any smokeless tobacco history on file. She reports that she does not drink alcohol or use illicit drugs.   Family History: History reviewed. No pertinent family history.  Review of Systems: General: negative for chills, fever, night sweats or weight changes.  Cardiovascular: positive for palpitations, negative for chest pain, dyspnea on exertion, edema, orthopnea, paroxysmal nocturnal dyspnea or shortness of breath Dermatological: negative for rash Respiratory: negative for cough or wheezing Urologic: negative for hematuria Abdominal: negative for nausea, vomiting, diarrhea, bright red blood per rectum, melena, or hematemesis Neurologic: positive for lightheadedness, negative for visual changes, syncope All other systems reviewed and are otherwise negative except as noted above.  PHYSICAL EXAM: VS:  BP 158/60  Pulse 83  Ht 5\' 2"  (1.575 m)  Wt 159 lb 4 oz (72.235 kg)  BMI 29.12 kg/m2 Caucasian female appearing younger than stated age in no acute distress HEENT: normal, PERRL Neck: no JVD or bruits Cardiac:  normal S1, S2; RRR; no murmur or gallops Lungs:  clear to auscultation bilaterally, no wheezing, rhonchi or rales Abd: soft, nontender, no hepatomegaly, normoactive BS x 4 quads Ext: no edema, cyanosis or clubbing Skin: warm and dry, cap refill < 2 sec Neuro:  CNs 2-12 intact, no focal abnormalities noted Musculoskeletal: strength and tone appropriate for age  Psych: normal affect

## 2012-08-20 NOTE — Assessment & Plan Note (Addendum)
BP 158-170/60-62 at rest today. Given DM2, new JNC VIII guidelines suggest goal <140/90. Suspect will need to be more lenient with BP given orthostasis (elderly with diabetes). As above, will hold HCTZ for this reason and continue ARB alone. Re-evaluate on follow-up in 2 weeks. Note, patient's previous vision changes have continued despite Losartan withdrawal and are attributed to macular degeneration. CCB may be another option or regularly scheduled hydralazine should further BP control be needed.

## 2012-08-20 NOTE — Patient Instructions (Addendum)
We will stop your HCTZ (water pill) combination medication.  You will start Cozaar (losartan) alone. This is a blood pressure medication that protects your kidneys with diabetes.  Please wear compression stockings. If they do not fit, you can try the next size up.   Please practice the stretching exercises we discussed today.   We will increase your fluid intake. If you are still feeling poorly, please consume a small amount of salty food.

## 2012-08-27 DIAGNOSIS — H353190 Nonexudative age-related macular degeneration, unspecified eye, stage unspecified: Secondary | ICD-10-CM | POA: Diagnosis not present

## 2012-09-03 ENCOUNTER — Ambulatory Visit (INDEPENDENT_AMBULATORY_CARE_PROVIDER_SITE_OTHER): Payer: Medicare Other | Admitting: Cardiovascular Disease

## 2012-09-03 ENCOUNTER — Encounter: Payer: Self-pay | Admitting: Cardiovascular Disease

## 2012-09-03 VITALS — BP 154/68 | HR 84 | Ht 62.5 in | Wt 163.8 lb

## 2012-09-03 DIAGNOSIS — I10 Essential (primary) hypertension: Secondary | ICD-10-CM | POA: Diagnosis not present

## 2012-09-03 DIAGNOSIS — H3530 Unspecified macular degeneration: Secondary | ICD-10-CM | POA: Insufficient documentation

## 2012-09-03 DIAGNOSIS — E119 Type 2 diabetes mellitus without complications: Secondary | ICD-10-CM

## 2012-09-03 DIAGNOSIS — R002 Palpitations: Secondary | ICD-10-CM | POA: Diagnosis not present

## 2012-09-03 DIAGNOSIS — E785 Hyperlipidemia, unspecified: Secondary | ICD-10-CM | POA: Diagnosis not present

## 2012-09-03 NOTE — Progress Notes (Addendum)
Patient ID: Caroline Russo, female    DOB: [birth year], 79 y.o.   MRN: [id number]  HPI Comments: Caroline Russo is a pleasant 79 year old woman with hypertension, nonobstructive coronary artery disease in 2002, diabetes, obesity who presents for routine followup.  She was recently seen in the office for dizziness. Blood pressure measurements showed orthostasis and HCTZ was held. In followup today, she reports that she has less dizziness but she does have mild swelling of her feet. She denies any significant shortness of breath.  Her biggest complaint is vision problems and macular degeneration. She lives at home, is a high fall risk.  Legs are weak. she has had chronic chest pain and back pain, felt to be musculoskeletal. No recent chest pain. History of neck surgery in the past. She has been taking losartan 50 mg daily despite being instructed to take 100 mg daily.  Previous history of palpitations and event monitor was performed that showed occasional sinus bradycardia., Normal sinus rhythm mostly with frequent APCs. No other significant arrhythmia  EKG shows normal sinus rhythm , rate 84 beats per minute, no significant ST or T wave changes, frequent  APCs     Outpatient Encounter Prescriptions as of 09/03/2012  Medication Sig Dispense Refill  . ALPRAZolam (XANAX) 0.25 MG tablet Take 0.25 mg by mouth as needed.        . insulin detemir (LEVEMIR) 100 UNIT/ML injection Inject 29 Units into the skin 2 (two) times daily.       Marland Kitchen losartan (COZAAR) 100 MG tablet Take 1 tablet (100 mg total) by mouth daily.  90 tablet  3  . meclizine (ANTIVERT) 12.5 MG tablet Take 12.5 mg by mouth 3 (three) times daily as needed.      . metoprolol succinate (TOPROL-XL) 25 MG 24 hr tablet Take 1 tablet (25 mg total) by mouth 2 (two) times daily.  60 tablet  6  . Multiple Vitamins-Minerals (CENTRUM SILVER PO) Take by mouth daily.      . Multiple Vitamins-Minerals (ICAPS PO) Take 2 tablets by mouth daily.      .  nitroGLYCERIN (NITROSTAT) 0.4 MG SL tablet Place 0.4 mg under the tongue every 5 (five) minutes as needed.        . pantoprazole (PROTONIX) 40 MG tablet Take 1 tablet (40 mg total) by mouth 2 (two) times daily.  60 tablet  3  . SENNA CO 2 tablets by Combination route daily as needed.       . [DISCONTINUED] hydrALAZINE (APRESOLINE) 50 MG tablet Take 1 tablet (50 mg total) by mouth 3 (three) times daily as needed.  90 tablet  6    Review of Systems  Constitutional: Negative.   HENT: Negative.   Eyes: Negative.   Respiratory: Negative.   Cardiovascular: Positive for leg swelling.  Gastrointestinal: Negative.   Musculoskeletal: Positive for gait problem.  Skin: Negative.   Neurological: Negative.   Psychiatric/Behavioral: Negative.   All other systems reviewed and are negative.    BP 154/68  Pulse 84  Ht 5' 2.5" (1.588 m)  Wt 163 lb 12 oz (74.277 kg)  BMI 29.45 kg/m2  Physical Exam  Nursing note and vitals reviewed. Constitutional: She is oriented to person, place, and time. She appears well-developed and well-nourished.  HENT:  Head: Normocephalic.  Nose: Nose normal.  Mouth/Throat: Oropharynx is clear and moist.  Eyes: Conjunctivae are normal. Pupils are equal, round, and reactive to light.  Neck: Normal range of motion. Neck supple. No JVD present.  Cardiovascular: Normal rate, regular rhythm, S1 normal, S2 normal, normal heart sounds and intact distal pulses.  Exam reveals no gallop and no friction rub.   No murmur heard. Pulmonary/Chest: Effort normal and breath sounds normal. No respiratory distress. She has no wheezes. She has no rales. She exhibits no tenderness.  Abdominal: Soft. Bowel sounds are normal. She exhibits no distension. There is no tenderness.  Musculoskeletal: Normal range of motion. She exhibits no edema and no tenderness.  Lymphadenopathy:    She has no cervical adenopathy.  Neurological: She is alert and oriented to person, place, and time. Coordination  normal.  Skin: Skin is warm and dry. No rash noted. No erythema.  Psychiatric: She has a normal mood and affect. Her behavior is normal. Judgment and thought content normal.    Assessment and Plan

## 2012-09-03 NOTE — Assessment & Plan Note (Signed)
No recent lipid panel available 

## 2012-09-03 NOTE — Assessment & Plan Note (Signed)
We have encouraged her to take losartan 100 mg daily. She does have trace edema of her feet, likely from venous insufficiency. If symptoms get worse, we have suggested she could take HCTZ sparingly. This was held in the past for orthostatic hypotension.

## 2012-09-03 NOTE — Patient Instructions (Addendum)
You are doing well. No medication changes were made.  Please take a full losartan daily If you have Significant foot swelling, wear a dress sock, put your feet up,  You may need to take a losartan HCTZ instead of the regular losartan  Please call us if you have new issues that need to be addressed before your next appt.  Your physician wants you to follow-up in: 3 months.  You will receive a reminder letter in the mail two months in advance. If you don't receive a letter, please call our office to schedule the follow-up appointment.

## 2012-09-03 NOTE — Assessment & Plan Note (Addendum)
Significant vision issues. Currently living at home. I'm concerned about high fall risk. She reports that she has family that will stay with her but she is refusing this at this time. Recommended life alert

## 2012-09-03 NOTE — Assessment & Plan Note (Signed)
We have encouraged continued exercise, careful diet management in an effort to lose weight. 

## 2012-09-20 DIAGNOSIS — K219 Gastro-esophageal reflux disease without esophagitis: Secondary | ICD-10-CM | POA: Diagnosis not present

## 2012-09-20 DIAGNOSIS — E119 Type 2 diabetes mellitus without complications: Secondary | ICD-10-CM | POA: Diagnosis not present

## 2012-09-20 DIAGNOSIS — R42 Dizziness and giddiness: Secondary | ICD-10-CM | POA: Diagnosis not present

## 2012-09-20 DIAGNOSIS — I10 Essential (primary) hypertension: Secondary | ICD-10-CM | POA: Diagnosis not present

## 2012-09-25 DIAGNOSIS — R42 Dizziness and giddiness: Secondary | ICD-10-CM | POA: Diagnosis not present

## 2012-09-27 DIAGNOSIS — J309 Allergic rhinitis, unspecified: Secondary | ICD-10-CM | POA: Diagnosis not present

## 2012-09-27 DIAGNOSIS — R42 Dizziness and giddiness: Secondary | ICD-10-CM | POA: Diagnosis not present

## 2012-09-27 DIAGNOSIS — Z23 Encounter for immunization: Secondary | ICD-10-CM | POA: Diagnosis not present

## 2012-09-27 DIAGNOSIS — E119 Type 2 diabetes mellitus without complications: Secondary | ICD-10-CM | POA: Diagnosis not present

## 2012-09-27 DIAGNOSIS — I10 Essential (primary) hypertension: Secondary | ICD-10-CM | POA: Diagnosis not present

## 2012-10-24 ENCOUNTER — Telehealth: Payer: Self-pay

## 2012-10-24 NOTE — Telephone Encounter (Signed)
Pt called and has a question regarding her medicine,Losartin with potassium, states her feet are swelling and getting numb. Please call.

## 2012-10-30 ENCOUNTER — Other Ambulatory Visit: Payer: Self-pay

## 2012-10-30 ENCOUNTER — Telehealth: Payer: Self-pay

## 2012-10-30 MED ORDER — LOSARTAN POTASSIUM 100 MG PO TABS: 100.0000 mg | ORAL_TABLET | Freq: Every day | ORAL | Status: DC

## 2012-10-30 MED ORDER — HYDROCHLOROTHIAZIDE 25 MG PO TABS: 25.0000 mg | ORAL_TABLET | Freq: Every day | ORAL | Status: DC

## 2012-10-30 NOTE — Telephone Encounter (Signed)
Pt needs 90 day supply. Pt states she also needs HTCZ.

## 2012-10-30 NOTE — Telephone Encounter (Signed)
Spoke w/ pharmacist at [facility] and clarified that pt is to take regular losartan daily with an occasional hctz for swelling.   Pt had spilled some of her meds and needed to get refills early.

## 2012-10-30 NOTE — Telephone Encounter (Signed)
Spoke with patient regarding the Losartan HCTZ 100/25 mg taking 1/2 tablet twice a day;  finished on the Losartan HCTZ 3 days ago. The patient is now taking the Losartan 100 mg taking one tablet daily. She has noticed some increased swelling that comes and goes and not sure if taking medications correctly. Please call so she can get a refill sent in for a 90 day supply.

## 2012-11-07 DIAGNOSIS — R269 Unspecified abnormalities of gait and mobility: Secondary | ICD-10-CM | POA: Diagnosis not present

## 2012-11-12 DIAGNOSIS — R269 Unspecified abnormalities of gait and mobility: Secondary | ICD-10-CM | POA: Diagnosis not present

## 2012-11-15 DIAGNOSIS — R269 Unspecified abnormalities of gait and mobility: Secondary | ICD-10-CM | POA: Diagnosis not present

## 2012-11-15 DIAGNOSIS — E119 Type 2 diabetes mellitus without complications: Secondary | ICD-10-CM | POA: Diagnosis not present

## 2012-11-22 DIAGNOSIS — E119 Type 2 diabetes mellitus without complications: Secondary | ICD-10-CM | POA: Diagnosis not present

## 2012-11-24 DIAGNOSIS — R269 Unspecified abnormalities of gait and mobility: Secondary | ICD-10-CM | POA: Diagnosis not present

## 2012-11-28 DIAGNOSIS — R269 Unspecified abnormalities of gait and mobility: Secondary | ICD-10-CM | POA: Diagnosis not present

## 2012-12-04 ENCOUNTER — Ambulatory Visit (INDEPENDENT_AMBULATORY_CARE_PROVIDER_SITE_OTHER): Payer: Medicare Other | Admitting: Cardiovascular Disease

## 2012-12-04 ENCOUNTER — Encounter: Payer: Self-pay | Admitting: Cardiovascular Disease

## 2012-12-04 VITALS — BP 148/82 | HR 82 | Ht 62.0 in | Wt 160.0 lb

## 2012-12-04 DIAGNOSIS — I10 Essential (primary) hypertension: Secondary | ICD-10-CM

## 2012-12-04 DIAGNOSIS — E119 Type 2 diabetes mellitus without complications: Secondary | ICD-10-CM | POA: Diagnosis not present

## 2012-12-04 DIAGNOSIS — I951 Orthostatic hypotension: Secondary | ICD-10-CM

## 2012-12-04 DIAGNOSIS — R002 Palpitations: Secondary | ICD-10-CM

## 2012-12-04 DIAGNOSIS — I491 Atrial premature depolarization: Secondary | ICD-10-CM | POA: Insufficient documentation

## 2012-12-04 NOTE — Assessment & Plan Note (Signed)
We have encouraged continued exercise, careful diet management in an effort to lose weight. 

## 2012-12-04 NOTE — Assessment & Plan Note (Signed)
Frequent APCs seen on EKG and by history. Continue beta blocker. She is relatively asymptomatic

## 2012-12-04 NOTE — Patient Instructions (Addendum)
You are doing well. No medication changes were made.  Please call us if you have new issues that need to be addressed before your next appt.  Your physician wants you to follow-up in: 6 months.  You will receive a reminder letter in the mail two months in advance. If you don't receive a letter, please call our office to schedule the follow-up appointment.   

## 2012-12-04 NOTE — Progress Notes (Signed)
Patient ID: Caroline Russo, female    DOB: 1919/03/16, 79 y.o.   MRN: [id number]  HPI Comments: Caroline Russo is a pleasant 79 year old woman with hypertension, nonobstructive coronary artery disease in 2002, diabetes, obesity who presents for routine followup.  Previous episodes of dizziness with  Blood pressure measurements showing orthostasis.  HCTZ was held in the past. She reports that she is back  Her biggest complaint is her vision. She still drives, not like at nighttime. She lives at home alone, falls on occasion. She has macular degeneration. She does report having occasional palpitations.  she has had chronic chest pain and back pain, felt to be musculoskeletal. No recent chest pain. History of neck surgery in the past. She is otherwise active at baseline with no complaints  Previous history of palpitations and event monitor was performed that showed occasional sinus bradycardia., Normal sinus rhythm mostly with frequent APCs. No other significant arrhythmia  EKG shows normal sinus rhythm , rate 84 beats per minute, no significant ST or T wave changes, frequent  APCs     Outpatient Encounter Prescriptions as of 12/04/2012  Medication Sig  . ALPRAZolam (XANAX) 0.25 MG tablet Take 0.25 mg by mouth as needed.    . fluticasone (FLONASE) 50 MCG/ACT nasal spray Place 1 spray into both nostrils daily as needed.   . hydrochlorothiazide (HYDRODIURIL) 25 MG tablet Take 1 tablet (25 mg total) by mouth daily.  . insulin detemir (LEVEMIR) 100 UNIT/ML injection Inject 29 Units into the skin 2 (two) times daily.   . Insulin Pen Needle (PEN NEEDLES) 31G X 6 MM MISC   . losartan (COZAAR) 100 MG tablet Take 1 tablet (100 mg total) by mouth daily.  . meclizine (ANTIVERT) 12.5 MG tablet Take 12.5 mg by mouth 3 (three) times daily as needed.  . metoprolol succinate (TOPROL-XL) 25 MG 24 hr tablet Take 1 tablet (25 mg total) by mouth 2 (two) times daily.  . Multiple Vitamins-Minerals (CENTRUM SILVER PO)  Take by mouth daily.  . Multiple Vitamins-Minerals (ICAPS PO) Take 2 tablets by mouth daily.  . nitroGLYCERIN (NITROSTAT) 0.4 MG SL tablet Place 0.4 mg under the tongue every 5 (five) minutes as needed.    . pantoprazole (PROTONIX) 40 MG tablet Take 1 tablet (40 mg total) by mouth 2 (two) times daily.  . SENNA CO 2 tablets by Combination route daily as needed.      Review of Systems  Constitutional: Negative.   HENT: Negative.   Eyes: Positive for visual disturbance.  Respiratory: Negative.   Gastrointestinal: Negative.   Endocrine: Negative.   Musculoskeletal: Positive for gait problem.  Skin: Negative.   Allergic/Immunologic: Negative.   Neurological: Negative.   Hematological: Negative.   Psychiatric/Behavioral: Negative.   All other systems reviewed and are negative.    BP 148/82  Pulse 82  Ht 5\' 2"  (1.575 m)  Wt 160 lb (72.576 kg)  BMI 29.26 kg/m2  Physical Exam  Nursing note and vitals reviewed. Constitutional: She is oriented to person, place, and time. She appears well-developed and well-nourished.  HENT:  Head: Normocephalic.  Nose: Nose normal.  Mouth/Throat: Oropharynx is clear and moist.  Eyes: Conjunctivae are normal. Pupils are equal, round, and reactive to light.  Neck: Normal range of motion. Neck supple. No JVD present.  Cardiovascular: Normal rate, regular rhythm, S1 normal, S2 normal, normal heart sounds and intact distal pulses.  Exam reveals no gallop and no friction rub.   No murmur heard. Pulmonary/Chest: Effort normal and breath  sounds normal. No respiratory distress. She has no wheezes. She has no rales. She exhibits no tenderness.  Abdominal: Soft. Bowel sounds are normal. She exhibits no distension. There is no tenderness.  Musculoskeletal: Normal range of motion. She exhibits no edema and no tenderness.  Lymphadenopathy:    She has no cervical adenopathy.  Neurological: She is alert and oriented to person, place, and time. Coordination  normal.  Skin: Skin is warm and dry. No rash noted. No erythema.  Psychiatric: She has a normal mood and affect. Her behavior is normal. Judgment and thought content normal.    Assessment and Plan

## 2012-12-04 NOTE — Assessment & Plan Note (Signed)
Blood pressure is well controlled on today's visit. No changes made to the medications. 

## 2012-12-04 NOTE — Assessment & Plan Note (Signed)
Blood pressure stable today. No recent symptoms of dizziness

## 2012-12-05 DIAGNOSIS — R269 Unspecified abnormalities of gait and mobility: Secondary | ICD-10-CM | POA: Diagnosis not present

## 2012-12-18 DIAGNOSIS — F411 Generalized anxiety disorder: Secondary | ICD-10-CM | POA: Diagnosis not present

## 2012-12-18 DIAGNOSIS — E119 Type 2 diabetes mellitus without complications: Secondary | ICD-10-CM | POA: Diagnosis not present

## 2012-12-18 DIAGNOSIS — I10 Essential (primary) hypertension: Secondary | ICD-10-CM | POA: Diagnosis not present

## 2012-12-18 DIAGNOSIS — K219 Gastro-esophageal reflux disease without esophagitis: Secondary | ICD-10-CM | POA: Diagnosis not present

## 2012-12-27 ENCOUNTER — Telehealth: Payer: Self-pay

## 2012-12-27 NOTE — Telephone Encounter (Signed)
Left message for Cheryl to call back

## 2012-12-27 NOTE — Telephone Encounter (Signed)
Pt daughter called and states pt is having episode of her "heart skipping a beat" Has a question regarding her metoprolol. Please call.

## 2012-12-27 NOTE — Telephone Encounter (Signed)
Spoke w/ pt's daughter Elnita Maxwell who states that pt has been having increased PVCs recently and "thought Dr. Mariah Milling told her to take an extra 1/2 metoprolol some time last year." Reviewed pt's records and found where pt was instructed to take hydralazine if needed, but found no changes in metoprolol dose. Elnita Maxwell states that pt frequently confuses her medications and does not like to tell her what she is taking. Elnita Maxwell is concerned about pt's heart rate possibly dropping too low if she self adjusts metoprolol as she lives alone and has a h/o falls.  She reports that "skipping" is usually worse in the am and have increased since her last visit. Admits that pt has morning cup of coffee (recently switched to decaf) and is stressed about the holidays. She would like to know if Dr. Mariah Milling would like to make any changes in med regimen at this time or if she should continue to monitor symptoms and see if they resolve after the holidays.  Please advise. Thank you!

## 2012-12-27 NOTE — Telephone Encounter (Signed)
Elnita Maxwell called and left voicemail that she was unable to reach her phone. Attempted to call her back.  Left message on her voicemail.

## 2012-12-27 NOTE — Telephone Encounter (Signed)
Should be ok to take extra 1/2 metoprolol as needed for palps in addition to 25 mg BID She was not on hydralazine before She is on losartan and HCTZ it appears from office visit last month

## 2012-12-28 NOTE — Telephone Encounter (Signed)
Spoke w/ Caroline Russo.  She is agreeable to plan and will call with any questions or concerns.

## 2013-01-15 ENCOUNTER — Ambulatory Visit: Payer: Self-pay | Admitting: Physician Assistant

## 2013-01-15 DIAGNOSIS — IMO0001 Reserved for inherently not codable concepts without codable children: Secondary | ICD-10-CM | POA: Diagnosis not present

## 2013-01-15 DIAGNOSIS — M169 Osteoarthritis of hip, unspecified: Secondary | ICD-10-CM | POA: Diagnosis not present

## 2013-01-15 DIAGNOSIS — M7989 Other specified soft tissue disorders: Secondary | ICD-10-CM | POA: Diagnosis not present

## 2013-01-15 DIAGNOSIS — M25559 Pain in unspecified hip: Secondary | ICD-10-CM | POA: Diagnosis not present

## 2013-01-24 DIAGNOSIS — C8590 Non-Hodgkin lymphoma, unspecified, unspecified site: Secondary | ICD-10-CM

## 2013-01-24 HISTORY — DX: Non-Hodgkin lymphoma, unspecified, unspecified site: C85.90

## 2013-02-08 DIAGNOSIS — L989 Disorder of the skin and subcutaneous tissue, unspecified: Secondary | ICD-10-CM | POA: Diagnosis not present

## 2013-02-08 DIAGNOSIS — R238 Other skin changes: Secondary | ICD-10-CM | POA: Insufficient documentation

## 2013-02-20 ENCOUNTER — Other Ambulatory Visit: Payer: Self-pay | Admitting: *Deleted

## 2013-02-20 MED ORDER — METOPROLOL SUCCINATE ER 25 MG PO TB24: 25.0000 mg | ORAL_TABLET | Freq: Two times a day (BID) | ORAL | Status: DC

## 2013-02-20 NOTE — Telephone Encounter (Signed)
Requested Prescriptions   Signed Prescriptions Disp Refills  . metoprolol succinate (TOPROL-XL) 25 MG 24 hr tablet 60 tablet 6    Sig: Take 1 tablet (25 mg total) by mouth 2 (two) times daily.    Authorizing Provider: Minna Merritts    Ordering User: Britt Bottom

## 2013-02-25 DIAGNOSIS — H353190 Nonexudative age-related macular degeneration, unspecified eye, stage unspecified: Secondary | ICD-10-CM | POA: Diagnosis not present

## 2013-03-18 DIAGNOSIS — L821 Other seborrheic keratosis: Secondary | ICD-10-CM | POA: Diagnosis not present

## 2013-03-18 DIAGNOSIS — L578 Other skin changes due to chronic exposure to nonionizing radiation: Secondary | ICD-10-CM | POA: Diagnosis not present

## 2013-03-18 DIAGNOSIS — L909 Atrophic disorder of skin, unspecified: Secondary | ICD-10-CM | POA: Diagnosis not present

## 2013-03-18 DIAGNOSIS — L919 Hypertrophic disorder of the skin, unspecified: Secondary | ICD-10-CM | POA: Diagnosis not present

## 2013-03-18 DIAGNOSIS — L819 Disorder of pigmentation, unspecified: Secondary | ICD-10-CM | POA: Diagnosis not present

## 2013-03-20 DIAGNOSIS — E119 Type 2 diabetes mellitus without complications: Secondary | ICD-10-CM | POA: Diagnosis not present

## 2013-03-27 DIAGNOSIS — Z794 Long term (current) use of insulin: Secondary | ICD-10-CM | POA: Diagnosis not present

## 2013-03-27 DIAGNOSIS — E119 Type 2 diabetes mellitus without complications: Secondary | ICD-10-CM | POA: Diagnosis not present

## 2013-04-13 ENCOUNTER — Emergency Department: Payer: Self-pay | Admitting: Emergency Medicine

## 2013-04-13 DIAGNOSIS — M25519 Pain in unspecified shoulder: Secondary | ICD-10-CM | POA: Diagnosis not present

## 2013-04-13 DIAGNOSIS — Z9079 Acquired absence of other genital organ(s): Secondary | ICD-10-CM | POA: Diagnosis not present

## 2013-04-13 DIAGNOSIS — Z880 Allergy status to penicillin: Secondary | ICD-10-CM | POA: Diagnosis not present

## 2013-04-13 DIAGNOSIS — S6390XA Sprain of unspecified part of unspecified wrist and hand, initial encounter: Secondary | ICD-10-CM | POA: Diagnosis not present

## 2013-04-13 DIAGNOSIS — R9431 Abnormal electrocardiogram [ECG] [EKG]: Secondary | ICD-10-CM | POA: Diagnosis not present

## 2013-04-13 DIAGNOSIS — Z9889 Other specified postprocedural states: Secondary | ICD-10-CM | POA: Diagnosis not present

## 2013-04-13 DIAGNOSIS — Z79899 Other long term (current) drug therapy: Secondary | ICD-10-CM | POA: Diagnosis not present

## 2013-04-13 DIAGNOSIS — E119 Type 2 diabetes mellitus without complications: Secondary | ICD-10-CM | POA: Diagnosis not present

## 2013-04-13 DIAGNOSIS — S4980XA Other specified injuries of shoulder and upper arm, unspecified arm, initial encounter: Secondary | ICD-10-CM | POA: Diagnosis not present

## 2013-04-13 DIAGNOSIS — Z7982 Long term (current) use of aspirin: Secondary | ICD-10-CM | POA: Diagnosis not present

## 2013-04-13 DIAGNOSIS — I10 Essential (primary) hypertension: Secondary | ICD-10-CM | POA: Diagnosis not present

## 2013-04-13 DIAGNOSIS — S46909A Unspecified injury of unspecified muscle, fascia and tendon at shoulder and upper arm level, unspecified arm, initial encounter: Secondary | ICD-10-CM | POA: Diagnosis not present

## 2013-04-13 DIAGNOSIS — Z888 Allergy status to other drugs, medicaments and biological substances status: Secondary | ICD-10-CM | POA: Diagnosis not present

## 2013-04-13 DIAGNOSIS — M19019 Primary osteoarthritis, unspecified shoulder: Secondary | ICD-10-CM | POA: Diagnosis not present

## 2013-04-13 LAB — CBC
HCT: 43.5 % (ref 35.0–47.0)
HGB: 14.2 g/dL (ref 12.0–16.0)
MCH: 29 pg (ref 26.0–34.0)
MCHC: 32.7 g/dL (ref 32.0–36.0)
MCV: 89 fL (ref 80–100)
Platelet: 184 10*3/uL (ref 150–440)
RBC: 4.9 10*6/uL (ref 3.80–5.20)
RDW: 14.2 % (ref 11.5–14.5)
WBC: 7.5 10*3/uL (ref 3.6–11.0)

## 2013-04-13 LAB — BASIC METABOLIC PANEL
ANION GAP: 7 (ref 7–16)
BUN: 14 mg/dL (ref 7–18)
CALCIUM: 8.2 mg/dL — AB (ref 8.5–10.1)
CHLORIDE: 104 mmol/L (ref 98–107)
CO2: 25 mmol/L (ref 21–32)
CREATININE: 0.89 mg/dL (ref 0.60–1.30)
EGFR (African American): >60
EGFR (Non-African Amer.): 57 — ABNORMAL LOW
Glucose: 238 mg/dL — ABNORMAL HIGH (ref 65–99)
OSMOLALITY: 280 (ref 275–301)
Potassium: 3.9 mmol/L (ref 3.5–5.1)
SODIUM: 136 mmol/L (ref 136–145)

## 2013-04-13 LAB — TROPONIN I: Troponin-I: <0.02 ng/mL

## 2013-04-17 DIAGNOSIS — S40029A Contusion of unspecified upper arm, initial encounter: Secondary | ICD-10-CM | POA: Diagnosis not present

## 2013-05-29 DIAGNOSIS — E119 Type 2 diabetes mellitus without complications: Secondary | ICD-10-CM | POA: Diagnosis not present

## 2013-05-29 DIAGNOSIS — I10 Essential (primary) hypertension: Secondary | ICD-10-CM | POA: Diagnosis not present

## 2013-05-29 DIAGNOSIS — F411 Generalized anxiety disorder: Secondary | ICD-10-CM | POA: Diagnosis not present

## 2013-05-29 DIAGNOSIS — K219 Gastro-esophageal reflux disease without esophagitis: Secondary | ICD-10-CM | POA: Diagnosis not present

## 2013-06-03 ENCOUNTER — Encounter (INDEPENDENT_AMBULATORY_CARE_PROVIDER_SITE_OTHER): Payer: Self-pay

## 2013-06-03 ENCOUNTER — Encounter: Payer: Self-pay | Admitting: Cardiovascular Disease

## 2013-06-03 ENCOUNTER — Ambulatory Visit (INDEPENDENT_AMBULATORY_CARE_PROVIDER_SITE_OTHER): Payer: Medicare Other | Admitting: Cardiovascular Disease

## 2013-06-03 VITALS — BP 160/78 | HR 56 | Ht 62.5 in | Wt 159.0 lb

## 2013-06-03 DIAGNOSIS — E785 Hyperlipidemia, unspecified: Secondary | ICD-10-CM

## 2013-06-03 DIAGNOSIS — E119 Type 2 diabetes mellitus without complications: Secondary | ICD-10-CM

## 2013-06-03 DIAGNOSIS — I10 Essential (primary) hypertension: Secondary | ICD-10-CM | POA: Diagnosis not present

## 2013-06-03 DIAGNOSIS — I491 Atrial premature depolarization: Secondary | ICD-10-CM | POA: Diagnosis not present

## 2013-06-03 DIAGNOSIS — R0602 Shortness of breath: Secondary | ICD-10-CM | POA: Diagnosis not present

## 2013-06-03 DIAGNOSIS — R002 Palpitations: Secondary | ICD-10-CM | POA: Diagnosis not present

## 2013-06-03 MED ORDER — LOSARTAN POTASSIUM-HCTZ 100-12.5 MG PO TABS: 1.0000 | ORAL_TABLET | Freq: Every day | ORAL | Status: DC

## 2013-06-03 MED ORDER — HYDROCHLOROTHIAZIDE 25 MG PO TABS: 25.0000 mg | ORAL_TABLET | Freq: Every day | ORAL | Status: DC | PRN

## 2013-06-03 NOTE — Assessment & Plan Note (Signed)
Patient's daughter who presents with her today has indicated that she would like to combine the losartan and HCTZ. She has not been taking HCTZ on a daily basis, only sporadically. I suggested she take the losartan/HCTZ 100/12.5 mg daily. We'll try to avoid the 25 mg dose given prior hypotension and orthostasis.

## 2013-06-03 NOTE — Assessment & Plan Note (Signed)
[name] not on a statin.

## 2013-06-03 NOTE — Assessment & Plan Note (Signed)
We have encouraged continued exercise, careful diet management in an effort to lose weight. 

## 2013-06-03 NOTE — Progress Notes (Signed)
Patient ID: Caroline Russo, female    DOB: [birth year], 80 y.o.   MRN: [id number]  HPI Comments: Caroline Russo is a pleasant 80 year old woman with hypertension, nonobstructive coronary artery disease in 2002, diabetes, obesity who presents for routine followup.  Previous episodes of dizziness with previous Blood pressure measurements showing orthostasis.  HCTZ was held in the past. She now takes this periodically  Chronic problem with her vision. She still drives, not like at nighttime. She lives at home alone, falls on occasion. Daughter checks in on her frequently She has macular degeneration.   Daughter is concerned that she's not taking her medications on a regular basis, particularly her HCTZ. Blood pressure at home has been well-controlled with systolic pressures in the 130s. Recent trauma to her left wrist while lifting something heavy in the kitchen. By her report, sounds as if she has ruptured her biceps tendon.  she has had chronic chest pain and back pain, felt to be musculoskeletal. No recent chest pain. History of neck surgery in the past. She is otherwise active at baseline with no complaints  Previous history of palpitations and event monitor was performed that showed occasional sinus bradycardia., Normal sinus rhythm mostly with frequent APCs. No other significant arrhythmia  EKG shows normal sinus rhythm , rate 57 beats per minute, no significant ST or T wave changes    Outpatient Encounter Prescriptions as of 06/03/2013  Medication Sig  . ALPRAZolam (XANAX) 0.25 MG tablet Take 0.25 mg by mouth as needed.    . hydrochlorothiazide (HYDRODIURIL) 25 MG tablet Take 1 tablet (25 mg total) by mouth daily.  . insulin detemir (LEVEMIR) 100 UNIT/ML injection Inject 29 Units into the skin 2 (two) times daily.   . Insulin Pen Needle (PEN NEEDLES) 31G X 6 MM MISC   . losartan (COZAAR) 100 MG tablet Take 1 tablet (100 mg total) by mouth daily.  . meclizine (ANTIVERT) 12.5 MG tablet Take 12.5  mg by mouth 3 (three) times daily as needed.  . metoprolol succinate (TOPROL-XL) 25 MG 24 hr tablet Take 1 tablet (25 mg total) by mouth 2 (two) times daily.  . Multiple Vitamins-Minerals (CENTRUM SILVER PO) Take by mouth daily.  . Multiple Vitamins-Minerals (OCUVITE PRESERVISION PO) Take by mouth 2 (two) times daily.  . nitroGLYCERIN (NITROSTAT) 0.4 MG SL tablet Place 0.4 mg under the tongue every 5 (five) minutes as needed.    . SENNA CO 2 tablets by Combination route daily as needed.   . pantoprazole (PROTONIX) 40 MG tablet Take 1 tablet (40 mg total) by mouth 2 (two) times daily.    Review of Systems  Constitutional: Negative.   HENT: Negative.   Eyes: Positive for visual disturbance.  Respiratory: Negative.   Gastrointestinal: Negative.   Endocrine: Negative.   Musculoskeletal: Positive for gait problem.  Skin: Negative.   Allergic/Immunologic: Negative.   Neurological: Negative.   Hematological: Negative.   Psychiatric/Behavioral: Negative.   All other systems reviewed and are negative.   BP 160/78  Pulse 56  Ht 5' 2.5" (1.588 m)  Wt 159 lb (72.122 kg)  BMI 28.60 kg/m2  Physical Exam  Nursing note and vitals reviewed. Constitutional: She is oriented to person, place, and time. She appears well-developed and well-nourished.  HENT:  Head: Normocephalic.  Nose: Nose normal.  Mouth/Throat: Oropharynx is clear and moist.  Eyes: Conjunctivae are normal. Pupils are equal, round, and reactive to light.  Neck: Normal range of motion. Neck supple. No JVD present.  Cardiovascular: Normal rate,  regular rhythm, S1 normal, S2 normal and intact distal pulses.  Exam reveals no gallop and no friction rub.   Murmur heard.  Systolic murmur is present with a grade of 1/6  Pulmonary/Chest: Effort normal and breath sounds normal. No respiratory distress. She has no wheezes. She has no rales. She exhibits no tenderness.  Abdominal: Soft. Bowel sounds are normal. She exhibits no distension.  There is no tenderness.  Musculoskeletal: Normal range of motion. She exhibits no edema and no tenderness.  Lymphadenopathy:    She has no cervical adenopathy.  Neurological: She is alert and oriented to person, place, and time. Coordination normal.  Skin: Skin is warm and dry. No rash noted. No erythema.  Psychiatric: She has a normal mood and affect. Her behavior is normal. Judgment and thought content normal.    Assessment and Plan

## 2013-06-03 NOTE — Patient Instructions (Signed)
You are doing well.  Please hold the losartan and the HCTZ Start losartan/HCTZ one a day  Please call us if you have new issues that need to be addressed before your next appt.  Your physician wants you to follow-up in: 6 months.  You will receive a reminder letter in the mail two months in advance. If you don't receive a letter, please call our office to schedule the follow-up appointment.

## 2013-06-10 ENCOUNTER — Other Ambulatory Visit: Payer: Self-pay

## 2013-06-10 MED ORDER — PANTOPRAZOLE SODIUM 40 MG PO TBEC: 40.0000 mg | DELAYED_RELEASE_TABLET | Freq: Two times a day (BID) | ORAL | Status: DC

## 2013-06-10 NOTE — Telephone Encounter (Signed)
Refill sent for pantoprazole 40 mg take one tablet twice a day.

## 2013-06-11 ENCOUNTER — Telehealth: Payer: Self-pay | Admitting: *Deleted

## 2013-06-11 NOTE — Telephone Encounter (Signed)
Please call daughter. She is concerned about her mother's bp, light headed, confused, pounding in right ear.

## 2013-06-11 NOTE — Telephone Encounter (Signed)
Spoke w/ pt and her daughter. Pt reports that her "right ear is making noise" and pounding. She called EMS on Mon who did an EKG that was normal. BP has been good, but was elevated this am "too high to register", after am meds, 119/54. Reports that an "eyetooth" fell out on Fri and left the root.  She had the root taken out, but has not healed yet.  She has had the sound in her ear ever since.  Advised pt to contact her periodontist that performed her surgery, as there could be infection or inflammation that needs to be addressed. Pt is hesitant to call anyone else, as she states that they do not give her the attention that she feels she deserves.  Discussed w/ pt and daughter the effects that pain, infection and inflammation can have on pt's hearing, as well as BP. She is agreeable and will call if we can be of further assistance.

## 2013-06-13 ENCOUNTER — Emergency Department: Payer: Self-pay | Admitting: Emergency Medicine

## 2013-06-13 ENCOUNTER — Ambulatory Visit: Payer: Self-pay

## 2013-06-13 DIAGNOSIS — Z79899 Other long term (current) drug therapy: Secondary | ICD-10-CM | POA: Diagnosis not present

## 2013-06-13 DIAGNOSIS — K029 Dental caries, unspecified: Secondary | ICD-10-CM | POA: Diagnosis not present

## 2013-06-13 DIAGNOSIS — K5900 Constipation, unspecified: Secondary | ICD-10-CM | POA: Diagnosis not present

## 2013-06-13 DIAGNOSIS — R030 Elevated blood-pressure reading, without diagnosis of hypertension: Secondary | ICD-10-CM | POA: Diagnosis not present

## 2013-06-13 DIAGNOSIS — E119 Type 2 diabetes mellitus without complications: Secondary | ICD-10-CM | POA: Diagnosis not present

## 2013-06-13 DIAGNOSIS — I10 Essential (primary) hypertension: Secondary | ICD-10-CM | POA: Diagnosis not present

## 2013-06-13 DIAGNOSIS — R5383 Other fatigue: Secondary | ICD-10-CM | POA: Diagnosis not present

## 2013-06-13 DIAGNOSIS — R5381 Other malaise: Secondary | ICD-10-CM | POA: Diagnosis not present

## 2013-06-13 DIAGNOSIS — R443 Hallucinations, unspecified: Secondary | ICD-10-CM | POA: Diagnosis not present

## 2013-06-13 DIAGNOSIS — J90 Pleural effusion, not elsewhere classified: Secondary | ICD-10-CM | POA: Diagnosis not present

## 2013-06-13 LAB — URINALYSIS, COMPLETE
BACTERIA: NONE SEEN
BILIRUBIN, UR: NEGATIVE
Blood: NEGATIVE
Glucose,UR: NEGATIVE mg/dL (ref 0–75)
Ketone: NEGATIVE
Leukocyte Esterase: NEGATIVE
Nitrite: NEGATIVE
PH: 6 (ref 4.5–8.0)
Protein: NEGATIVE
Specific Gravity: 1.006 (ref 1.003–1.030)
WBC UR: 2 /HPF (ref 0–5)

## 2013-06-13 LAB — CBC
HCT: 48.5 % — ABNORMAL HIGH (ref 35.0–47.0)
HGB: 15.9 g/dL (ref 12.0–16.0)
MCH: 28.6 pg (ref 26.0–34.0)
MCHC: 32.8 g/dL (ref 32.0–36.0)
MCV: 87 fL (ref 80–100)
Platelet: 240 10*3/uL (ref 150–440)
RBC: 5.58 10*6/uL — ABNORMAL HIGH (ref 3.80–5.20)
RDW: 14.1 % (ref 11.5–14.5)
WBC: 8.9 10*3/uL (ref 3.6–11.0)

## 2013-06-13 LAB — COMPREHENSIVE METABOLIC PANEL
ALBUMIN: 3.8 g/dL (ref 3.4–5.0)
ANION GAP: 13 (ref 7–16)
AST: 21 U/L (ref 15–37)
Alkaline Phosphatase: 78 U/L
BUN: 28 mg/dL — ABNORMAL HIGH (ref 7–18)
Bilirubin,Total: 0.7 mg/dL (ref 0.2–1.0)
CO2: 25 mmol/L (ref 21–32)
Calcium, Total: 8.8 mg/dL (ref 8.5–10.1)
Chloride: 93 mmol/L — ABNORMAL LOW (ref 98–107)
Creatinine: 1.38 mg/dL — ABNORMAL HIGH (ref 0.60–1.30)
EGFR (African American): 38 — ABNORMAL LOW
GFR CALC NON AF AMER: 33 — AB
Glucose: 138 mg/dL — ABNORMAL HIGH (ref 65–99)
Osmolality: 270 (ref 275–301)
Potassium: 3.7 mmol/L (ref 3.5–5.1)
SGPT (ALT): 16 U/L (ref 12–78)
Sodium: 131 mmol/L — ABNORMAL LOW (ref 136–145)
Total Protein: 7.4 g/dL (ref 6.4–8.2)

## 2013-06-13 LAB — PRO B NATRIURETIC PEPTIDE: B-Type Natriuretic Peptide: 2222 pg/mL — ABNORMAL HIGH (ref 0–450)

## 2013-06-13 LAB — TROPONIN I: TROPONIN-I: 0.03 ng/mL

## 2013-06-14 DIAGNOSIS — F411 Generalized anxiety disorder: Secondary | ICD-10-CM | POA: Diagnosis not present

## 2013-06-14 DIAGNOSIS — H5359 Other color vision deficiencies: Secondary | ICD-10-CM | POA: Diagnosis not present

## 2013-06-14 DIAGNOSIS — I10 Essential (primary) hypertension: Secondary | ICD-10-CM | POA: Diagnosis not present

## 2013-06-14 DIAGNOSIS — J90 Pleural effusion, not elsewhere classified: Secondary | ICD-10-CM | POA: Diagnosis not present

## 2013-06-18 ENCOUNTER — Encounter: Payer: Self-pay | Admitting: Cardiovascular Disease

## 2013-06-18 ENCOUNTER — Ambulatory Visit (INDEPENDENT_AMBULATORY_CARE_PROVIDER_SITE_OTHER): Payer: Medicare Other | Admitting: Cardiovascular Disease

## 2013-06-18 ENCOUNTER — Ambulatory Visit: Payer: Self-pay | Admitting: Cardiovascular Disease

## 2013-06-18 VITALS — BP 160/68 | HR 65 | Ht 62.5 in | Wt 158.2 lb

## 2013-06-18 DIAGNOSIS — R51 Headache: Secondary | ICD-10-CM

## 2013-06-18 DIAGNOSIS — R5383 Other fatigue: Secondary | ICD-10-CM

## 2013-06-18 DIAGNOSIS — R000 Tachycardia, unspecified: Secondary | ICD-10-CM

## 2013-06-18 DIAGNOSIS — R5381 Other malaise: Secondary | ICD-10-CM

## 2013-06-18 DIAGNOSIS — J90 Pleural effusion, not elsewhere classified: Secondary | ICD-10-CM | POA: Diagnosis not present

## 2013-06-18 DIAGNOSIS — R0602 Shortness of breath: Secondary | ICD-10-CM | POA: Diagnosis not present

## 2013-06-18 DIAGNOSIS — R599 Enlarged lymph nodes, unspecified: Secondary | ICD-10-CM | POA: Diagnosis not present

## 2013-06-18 DIAGNOSIS — K7689 Other specified diseases of liver: Secondary | ICD-10-CM | POA: Diagnosis not present

## 2013-06-18 DIAGNOSIS — R519 Headache, unspecified: Secondary | ICD-10-CM | POA: Insufficient documentation

## 2013-06-18 DIAGNOSIS — I10 Essential (primary) hypertension: Secondary | ICD-10-CM

## 2013-06-18 DIAGNOSIS — J9819 Other pulmonary collapse: Secondary | ICD-10-CM | POA: Diagnosis not present

## 2013-06-18 NOTE — Patient Instructions (Addendum)
Please retry the moxifloxicin for possible sinuusitis  We will schedule an echocardiogram for pleural effusion We can schedule a chest CT scan with no contrast:  Please [facility] at the medical mall entrance of [facility] today at 3:15pm  Please call ENT for evaluation of possible sinusitis  Please call us if you have new issues that need to be addressed before your next appt.

## 2013-06-18 NOTE — Assessment & Plan Note (Addendum)
She has lots of head complaints on today's visit including headache, ulceration in her right ear, pain on the right side of her face. Unable to exclude sinusitis. She has been seen by a dentist and was started on moxifloxacin but had difficulty tolerating the medication. Daughter will retry the moxifloxacin, suggested she take NSAIDs for headache with Tylenol as needed. If symptoms persist, she may need followup with ear nose throat. May even need head CT scan.

## 2013-06-18 NOTE — Progress Notes (Signed)
Patient ID: Caroline Russo, female    DOB: 07/02/1919, 80 y.o.   MRN: [id number]  HPI Comments: Caroline Russo is a pleasant 80 year old woman with hypertension, nonobstructive coronary artery disease in 2002, diabetes, obesity who presents for routine followup.  Previous episodes of dizziness with previous Blood pressure measurements showing orthostasis.  HCTZ was held in the past.  In followup today, she does not feel well. Daughter provides the details. One week ago, she had anxiety, was very emotional. In this setting she had some shortness of breath, nausea, weakness. She has had chronic throbbing in her right ear over the past week, pounding in her head, headaches, vision problems. Recently she's had 2 courses of Z-Pak. She was seen by the dentist for right jaw and head pain, throbbing given her right tooth extraction 2 weeks ago. Into started moxifloxacin. She is only able to tolerate this for 2 days before having vision problems. Seen in the urgent care in [facility] for general malaise, head throbbing. Sent to the emergency room. Chest x-ray showed right side moderate pleural effusion. Laboratory that time showed creatinine 1.38, BUN 28, BNP 2200. Daughter has been giving her Protonix, Maalox, Xanax for nausea. Patient reports that she cannot go on. When asked what in particular is bothering her, she Reports that it is "all of it".  No recent chest pain. Legs have been weak when getting up off the toilet  Workup in the emergency room was otherwise essentially normal with normal EKG, pleural effusion as mentioned above, CBC was normal, renal function as above, normal LFTs, normal cardiac enzymes  Chronic problem with her vision. She still drives, not like at nighttime. She lives at home alone, falls on occasion. Daughter checks in on her frequently She has macular degeneration.   Previous history of palpitations and event monitor was performed that showed occasional sinus bradycardia., Normal sinus  rhythm mostly with frequent APCs. No other significant arrhythmia  EKG shows normal sinus rhythm , rate 65 beats per minute, no significant ST or T wave changes, APCs noted    Outpatient Encounter Prescriptions as of 06/18/2013  Medication Sig  . ALPRAZolam (XANAX) 0.25 MG tablet Take 0.25 mg by mouth as needed.    . hydrochlorothiazide (HYDRODIURIL) 25 MG tablet Take 1 tablet (25 mg total) by mouth daily as needed.  . insulin detemir (LEVEMIR) 100 UNIT/ML injection Inject into the skin. Takes 20 units am and 6 units in the pm daily.  . Insulin Pen Needle (PEN NEEDLES) 31G X 6 MM MISC   . losartan-hydrochlorothiazide (HYZAAR) 100-12.5 MG per tablet Take 1 tablet by mouth daily.  . meclizine (ANTIVERT) 12.5 MG tablet Take 12.5 mg by mouth 3 (three) times daily as needed.  . metoprolol succinate (TOPROL-XL) 25 MG 24 hr tablet Take 1 tablet (25 mg total) by mouth 2 (two) times daily.  . Multiple Vitamins-Minerals (CENTRUM SILVER PO) Take by mouth daily.  . Multiple Vitamins-Minerals (OCUVITE PRESERVISION PO) Take by mouth 2 (two) times daily.  . nitroGLYCERIN (NITROSTAT) 0.4 MG SL tablet Place 0.4 mg under the tongue every 5 (five) minutes as needed.    . SENNA CO 2 tablets by Combination route daily as needed.   . pantoprazole (PROTONIX) 40 MG tablet Take 1 tablet (40 mg total) by mouth 2 (two) times daily.    Review of Systems  Constitutional: Negative.   HENT: Negative.   Eyes: Positive for visual disturbance.  Respiratory: Negative.   Cardiovascular: Negative.   Gastrointestinal: Negative.  Endocrine: Negative.   Musculoskeletal: Positive for gait problem.  Skin: Negative.   Allergic/Immunologic: Negative.   Neurological: Negative.   Hematological: Negative.   Psychiatric/Behavioral: Negative.   All other systems reviewed and are negative.   BP 160/68  Pulse 65  Ht 5' 2.5" (1.588 m)  Wt 158 lb 4 oz (71.782 kg)  BMI 28.47 kg/m2  Physical Exam  Nursing note and vitals  reviewed. Constitutional: She is oriented to person, place, and time. She appears well-developed and well-nourished.  HENT:  Head: Normocephalic.  Nose: Nose normal.  Mouth/Throat: Oropharynx is clear and moist.  Eyes: Conjunctivae are normal. Pupils are equal, round, and reactive to light.  Neck: Normal range of motion. Neck supple. No JVD present.  Cardiovascular: Normal rate, regular rhythm, S1 normal, S2 normal and intact distal pulses.  Exam reveals no gallop and no friction rub.   Murmur heard.  Systolic murmur is present with a grade of 1/6  Pulmonary/Chest: Effort normal and breath sounds normal. No respiratory distress. She has no wheezes. She has no rales. She exhibits no tenderness.  Abdominal: Soft. Bowel sounds are normal. She exhibits no distension. There is no tenderness.  Musculoskeletal: Normal range of motion. She exhibits no edema and no tenderness.  Lymphadenopathy:    She has no cervical adenopathy.  Neurological: She is alert and oriented to person, place, and time. Coordination normal.  Skin: Skin is warm and dry. No rash noted. No erythema.  Psychiatric: She has a normal mood and affect. Her behavior is normal. Judgment and thought content normal.    Assessment and Plan

## 2013-06-18 NOTE — Assessment & Plan Note (Signed)
Blood pressure running borderline high today. In the setting of headache and pain, blood pressure has been higher. No medication changes made at this time. We'll monitor for now

## 2013-06-18 NOTE — Assessment & Plan Note (Signed)
Moderate sized right pleural effusion of uncertain etiology. Unable to exclude diastolic CHF. Echocardiogram and noncontrast CT scan of the chest has been ordered. She has followup with pulmonary tomorrow in [place].

## 2013-06-18 NOTE — Assessment & Plan Note (Signed)
She is a difficult historian and seems to have general malaise on today's visit. In particular she is bothered by her head symptoms. Unable to exclude sinusitis. She will retry moxifloxacin.

## 2013-06-19 ENCOUNTER — Encounter: Payer: Self-pay | Admitting: Internal Medicine

## 2013-06-19 ENCOUNTER — Other Ambulatory Visit (HOSPITAL_COMMUNITY)
Admission: RE | Admit: 2013-06-19 | Discharge: 2013-06-19 | Disposition: A | Discharge status: Home / Self Care | Payer: Medicare Other | Source: Ambulatory Visit | Attending: Internal Medicine | Admitting: Internal Medicine

## 2013-06-19 ENCOUNTER — Ambulatory Visit (INDEPENDENT_AMBULATORY_CARE_PROVIDER_SITE_OTHER)
Admission: RE | Admit: 2013-06-19 | Discharge: 2013-06-19 | Disposition: A | Discharge status: Home / Self Care | Payer: Medicare Other | Source: Ambulatory Visit | Attending: Internal Medicine | Admitting: Internal Medicine

## 2013-06-19 ENCOUNTER — Other Ambulatory Visit: Payer: Self-pay

## 2013-06-19 ENCOUNTER — Other Ambulatory Visit: Payer: Medicare Other

## 2013-06-19 ENCOUNTER — Ambulatory Visit
Admission: RE | Admit: 2013-06-19 | Discharge: 2013-06-19 | Disposition: A | Discharge status: Home / Self Care | Payer: Medicare Other | Source: Ambulatory Visit | Attending: Internal Medicine | Admitting: Internal Medicine

## 2013-06-19 ENCOUNTER — Ambulatory Visit (INDEPENDENT_AMBULATORY_CARE_PROVIDER_SITE_OTHER): Payer: Medicare Other | Admitting: Internal Medicine

## 2013-06-19 VITALS — BP 142/68 | HR 61 | Temp 98.4°F | Ht 62.5 in | Wt 160.0 lb

## 2013-06-19 DIAGNOSIS — J90 Pleural effusion, not elsewhere classified: Secondary | ICD-10-CM | POA: Insufficient documentation

## 2013-06-19 DIAGNOSIS — C384 Malignant neoplasm of pleura: Secondary | ICD-10-CM | POA: Diagnosis not present

## 2013-06-19 DIAGNOSIS — R899 Unspecified abnormal finding in specimens from other organs, systems and tissues: Secondary | ICD-10-CM | POA: Diagnosis not present

## 2013-06-19 DIAGNOSIS — R0602 Shortness of breath: Secondary | ICD-10-CM

## 2013-06-19 DIAGNOSIS — R000 Tachycardia, unspecified: Secondary | ICD-10-CM

## 2013-06-19 DIAGNOSIS — J9819 Other pulmonary collapse: Secondary | ICD-10-CM | POA: Diagnosis not present

## 2013-06-19 NOTE — Progress Notes (Signed)
Quick Note:  Spoke with pt and notified of results per Dr. Wert. Pt verbalized understanding and denied any questions.  ______ 

## 2013-06-19 NOTE — Patient Instructions (Signed)
For cough take delsym 2 tsp every 12 hours as needed  Ok to use aleve or advil with meals if pain with breathing  Please remember to go to the  x-ray department downstairs for your tests - we will call you with all  the results when they are available and go from there

## 2013-06-19 NOTE — Progress Notes (Signed)
Quick Note:  LMTCB ______ 

## 2013-06-19 NOTE — Progress Notes (Signed)
Subjective:    Patient ID: Caroline Russo, female    DOB: 1919-10-11 MRN: [id number]  HPI  Primary = Venora Maples in [place]  89 yowf remote min smoker referred to pulmonary clinic  06/19/2013  by Dr Lewie Loron with new R pleural effusion.   06/19/2013 1st [facility] office visit/ Caroline Russo  Chief Complaint  Patient presents with  . Pulmonary Consult    Referred per Dr. Venora Maples for eval of pleural effusion. She c/o cough for "quite a while" and SOB for the past 2 wks. Cough is non prod. She gets SOB walking from room to room at home.   around first week in May 2015 had tooth removed then 5/19 and zpak then acutely sob/ anxious> 5 days later went ER > took 2 days avelox then couldn't tolerate any more and Improved some with breathing but weak and still no appetite. Can't lie down due to sob, dry cough. No fever, no leg swelling or h/o arthritis/ collagen vasc dz   No obvious other patterns in day to day or daytime variabilty or assoc chronic cough or cp or chest tightness, subjective wheeze overt sinus  symptoms. No unusual exp hx or h/o childhood pna/ asthma or knowledge of premature birth.  Sleeping ok without nocturnal  or early am exacerbation  of respiratory  c/o's or need for noct saba. Also denies any obvious fluctuation of symptoms with weather or environmental changes or other aggravating or alleviating factors except as outlined above   Current Medications, Allergies, Complete Past Medical History, Past Surgical History, Family History, and Social History were reviewed in [employer] record.              Review of Systems  Constitutional: Negative for fever, chills and unexpected weight change.  HENT: Positive for dental problem and ear pain. Negative for congestion, nosebleeds, postnasal drip, rhinorrhea, sinus pressure, sneezing, sore throat, trouble swallowing and voice change.   Eyes: Negative for visual disturbance.  Respiratory: Positive for  cough and shortness of breath. Negative for choking.   Cardiovascular: Negative for chest pain and leg swelling.  Gastrointestinal: Negative for vomiting, abdominal pain and diarrhea.  Genitourinary: Negative for difficulty urinating.       Heartburn Indigestion  Musculoskeletal: Negative for arthralgias.  Skin: Negative for rash.  Neurological: Positive for headaches. Negative for tremors and syncope.  Hematological: Does not bruise/bleed easily.       Objective:   Physical Exam   Wt Readings from Last 3 Encounters:  06/19/13 160 lb (72.576 kg)  06/18/13 158 lb 4 oz (71.782 kg)  06/03/13 159 lb (72.122 kg)      W/c bound elderly wf nad  HEENT: nl dentition, turbinates, and orophanx. Nl external ear canals without cough reflex   NECK :  without JVD/Nodes/TM/ nl carotid upstrokes bilaterally   LUNGS: no acc muscle use,  Decreased bs R base with dullness   CV:  RRR  no s3 or murmur or increase in P2, no edema   ABD:  soft and nontender with nl excursion in the supine position. No bruits or organomegaly, bowel sounds nl  MS:  warm without deformities, calf tenderness, cyanosis or clubbing  SKIN: warm and dry without lesions    NEURO:  alert, approp, no deficits     CXR  06/19/2013 : R effusion       Assessment & Plan:

## 2013-06-19 NOTE — Assessment & Plan Note (Addendum)
R effusion following tooth surgery brings up the issue of aspiration/ empyema or parapneumonic process  but note the absence of  Hx of fever or purulent sputum so ddx = chf, malignancy >>>  rec tap today and send for approp studies then regroup in 2 weeks  Sitting position/ sterile prep and drape > #18 gauge thoracentesis needle p topical anesthesia with 1% lidocaine > 1000 cc serous min cloudy straw colored fluid obtained and sent for cyt/prot, ldh, glucose, cell count  Pt tol well.  F/u cxr small residual/ no PTX

## 2013-06-20 ENCOUNTER — Telehealth: Payer: Self-pay

## 2013-06-20 NOTE — Telephone Encounter (Signed)
Spoke w/ Caroline Russo.  She states that she called PCP this am, but is still unsure if they are going to order this.  Advised her that Dr. Rockey Situ is out of the office today, but as soon as he returns, we will order this if it has not already been done so. She is very appreciative and will call if this is set up before tomorrow.

## 2013-06-20 NOTE — Telephone Encounter (Signed)
Pt daughter called and states that she does want to go ahead with the ct of abdomen. States PCP will not return her call before lunch today. Daughter wants this done ASAP. Please call.

## 2013-06-21 ENCOUNTER — Telehealth: Payer: Self-pay | Admitting: *Deleted

## 2013-06-21 ENCOUNTER — Other Ambulatory Visit: Payer: Self-pay

## 2013-06-21 ENCOUNTER — Encounter: Payer: Self-pay | Admitting: Internal Medicine

## 2013-06-21 DIAGNOSIS — R9389 Abnormal findings on diagnostic imaging of other specified body structures: Secondary | ICD-10-CM

## 2013-06-21 DIAGNOSIS — J90 Pleural effusion, not elsewhere classified: Secondary | ICD-10-CM

## 2013-06-21 DIAGNOSIS — R0602 Shortness of breath: Secondary | ICD-10-CM

## 2013-06-21 NOTE — Telephone Encounter (Signed)
[facility] Triage Call Report Triage Record Num: [id number] Operator: Geradine Girt Patient Name: Caroline Russo Call Date & Time: 06/19/2013 6:29:40PM Patient Phone: [phone] PCP: Patient Gender: Female PCP Fax : Patient DOB: 12/09/1919 Practice Name: [facility] Reason for Call: Caller: Janett Billow; PCP: Other; CB#: [phone]; Janett Billow from [facility] reporting STAT labs drawn today (06/19/13). Results: Glucose-pericardial fluid of 157, LD pericardial fluid of 157, cell count as follows: color: RED, Clarity: CLOUDY, WBC 1790 (HI), neutrophils 5, lymphocytes 89, monocytes 6, eosinophils 0, & not other cells noted. Results to be faxed to office for follow up. Protocol(s) Used: Office Note Recommended Outcome per Protocol: Information Noted and Sent to Office Reason for Outcome: Caller information to office Care Advice: ~ 06/19/2013 6:41:53PM Page 1 of 1 [id number]

## 2013-06-21 NOTE — Telephone Encounter (Signed)
Left message w/ Dr. Gustavus Bryant nurse, Lolita Patella, to have him call Dr. Rockey Situ at his convenience.

## 2013-06-21 NOTE — Telephone Encounter (Signed)
I suspect this is from a pleural effusion Uncertain what to make of these results May need to forward to Dr. Melvyn Novas

## 2013-06-21 NOTE — Telephone Encounter (Signed)
Forwarded to Dr. Melvyn Novas and his nurse, Magda Paganini.

## 2013-06-24 ENCOUNTER — Ambulatory Visit: Payer: Self-pay | Admitting: Cardiovascular Disease

## 2013-06-24 ENCOUNTER — Telehealth: Payer: Self-pay | Admitting: Internal Medicine

## 2013-06-24 ENCOUNTER — Encounter: Payer: Self-pay | Admitting: *Deleted

## 2013-06-24 ENCOUNTER — Other Ambulatory Visit: Payer: Self-pay

## 2013-06-24 DIAGNOSIS — J90 Pleural effusion, not elsewhere classified: Secondary | ICD-10-CM | POA: Diagnosis not present

## 2013-06-24 DIAGNOSIS — R9389 Abnormal findings on diagnostic imaging of other specified body structures: Secondary | ICD-10-CM

## 2013-06-24 DIAGNOSIS — K7689 Other specified diseases of liver: Secondary | ICD-10-CM | POA: Diagnosis not present

## 2013-06-24 DIAGNOSIS — C482 Malignant neoplasm of peritoneum, unspecified: Secondary | ICD-10-CM | POA: Diagnosis not present

## 2013-06-24 DIAGNOSIS — R0602 Shortness of breath: Secondary | ICD-10-CM

## 2013-06-24 NOTE — Telephone Encounter (Deleted)
Results are not in

## 2013-06-24 NOTE — Telephone Encounter (Signed)
Spoke with daughter Results are not in yet  I advised that we will call her once we receive these  She verbalized understanding

## 2013-06-25 ENCOUNTER — Ambulatory Visit: Payer: Self-pay | Admitting: Oncology

## 2013-06-25 ENCOUNTER — Telehealth: Payer: Self-pay | Admitting: Internal Medicine

## 2013-06-25 ENCOUNTER — Ambulatory Visit: Payer: Self-pay | Admitting: Cardiothoracic Surgery

## 2013-06-25 DIAGNOSIS — R971 Elevated cancer antigen 125 [CA 125]: Secondary | ICD-10-CM | POA: Diagnosis not present

## 2013-06-25 DIAGNOSIS — J9819 Other pulmonary collapse: Secondary | ICD-10-CM | POA: Diagnosis not present

## 2013-06-25 DIAGNOSIS — K219 Gastro-esophageal reflux disease without esophagitis: Secondary | ICD-10-CM | POA: Diagnosis not present

## 2013-06-25 DIAGNOSIS — R011 Cardiac murmur, unspecified: Secondary | ICD-10-CM | POA: Diagnosis not present

## 2013-06-25 DIAGNOSIS — M1990 Unspecified osteoarthritis, unspecified site: Secondary | ICD-10-CM | POA: Diagnosis not present

## 2013-06-25 DIAGNOSIS — R5383 Other fatigue: Secondary | ICD-10-CM | POA: Diagnosis not present

## 2013-06-25 DIAGNOSIS — R933 Abnormal findings on diagnostic imaging of other parts of digestive tract: Secondary | ICD-10-CM | POA: Diagnosis not present

## 2013-06-25 DIAGNOSIS — R110 Nausea: Secondary | ICD-10-CM | POA: Diagnosis not present

## 2013-06-25 DIAGNOSIS — R599 Enlarged lymph nodes, unspecified: Secondary | ICD-10-CM | POA: Diagnosis not present

## 2013-06-25 DIAGNOSIS — F341 Dysthymic disorder: Secondary | ICD-10-CM | POA: Diagnosis not present

## 2013-06-25 DIAGNOSIS — I2510 Atherosclerotic heart disease of native coronary artery without angina pectoris: Secondary | ICD-10-CM | POA: Diagnosis not present

## 2013-06-25 DIAGNOSIS — R0602 Shortness of breath: Secondary | ICD-10-CM | POA: Diagnosis not present

## 2013-06-25 DIAGNOSIS — Z90710 Acquired absence of both cervix and uterus: Secondary | ICD-10-CM | POA: Diagnosis not present

## 2013-06-25 DIAGNOSIS — Z79899 Other long term (current) drug therapy: Secondary | ICD-10-CM | POA: Diagnosis not present

## 2013-06-25 DIAGNOSIS — R634 Abnormal weight loss: Secondary | ICD-10-CM | POA: Diagnosis not present

## 2013-06-25 DIAGNOSIS — J90 Pleural effusion, not elsewhere classified: Secondary | ICD-10-CM | POA: Diagnosis not present

## 2013-06-25 DIAGNOSIS — K769 Liver disease, unspecified: Secondary | ICD-10-CM | POA: Diagnosis not present

## 2013-06-25 DIAGNOSIS — E119 Type 2 diabetes mellitus without complications: Secondary | ICD-10-CM | POA: Diagnosis not present

## 2013-06-25 DIAGNOSIS — R1900 Intra-abdominal and pelvic swelling, mass and lump, unspecified site: Secondary | ICD-10-CM | POA: Diagnosis not present

## 2013-06-25 DIAGNOSIS — R942 Abnormal results of pulmonary function studies: Secondary | ICD-10-CM | POA: Diagnosis not present

## 2013-06-25 DIAGNOSIS — R630 Anorexia: Secondary | ICD-10-CM | POA: Diagnosis not present

## 2013-06-25 DIAGNOSIS — I10 Essential (primary) hypertension: Secondary | ICD-10-CM | POA: Diagnosis not present

## 2013-06-25 DIAGNOSIS — E041 Nontoxic single thyroid nodule: Secondary | ICD-10-CM | POA: Diagnosis not present

## 2013-06-25 DIAGNOSIS — R5381 Other malaise: Secondary | ICD-10-CM | POA: Diagnosis not present

## 2013-06-25 DIAGNOSIS — F411 Generalized anxiety disorder: Secondary | ICD-10-CM | POA: Diagnosis not present

## 2013-06-25 DIAGNOSIS — I498 Other specified cardiac arrhythmias: Secondary | ICD-10-CM | POA: Diagnosis not present

## 2013-06-25 LAB — CBC CANCER CENTER
BASOS ABS: 0.1 x10 3/mm (ref 0.0–0.1)
Basophil %: 0.9 %
EOS PCT: 1.9 %
Eosinophil #: 0.2 x10 3/mm (ref 0.0–0.7)
HCT: 48.2 % — AB (ref 35.0–47.0)
HGB: 15.7 g/dL (ref 12.0–16.0)
Lymphocyte #: 2.8 x10 3/mm (ref 1.0–3.6)
Lymphocyte %: 31.2 %
MCH: 28.3 pg (ref 26.0–34.0)
MCHC: 32.6 g/dL (ref 32.0–36.0)
MCV: 87 fL (ref 80–100)
Monocyte #: 1.1 x10 3/mm — ABNORMAL HIGH (ref 0.2–0.9)
Monocyte %: 12.1 %
NEUTROS PCT: 53.9 %
Neutrophil #: 4.8 x10 3/mm (ref 1.4–6.5)
Platelet: 270 x10 3/mm (ref 150–440)
RBC: 5.55 10*6/uL — ABNORMAL HIGH (ref 3.80–5.20)
RDW: 13.4 % (ref 11.5–14.5)
WBC: 9 x10 3/mm (ref 3.6–11.0)

## 2013-06-25 LAB — COMPREHENSIVE METABOLIC PANEL
ALT: 18 U/L (ref 12–78)
ANION GAP: 9 (ref 7–16)
Albumin: 3.6 g/dL (ref 3.4–5.0)
Alkaline Phosphatase: 94 U/L
BUN: 17 mg/dL (ref 7–18)
Bilirubin,Total: 0.5 mg/dL (ref 0.2–1.0)
CHLORIDE: 97 mmol/L — AB (ref 98–107)
CREATININE: 1.06 mg/dL (ref 0.60–1.30)
Calcium, Total: 8.8 mg/dL (ref 8.5–10.1)
Co2: 30 mmol/L (ref 21–32)
EGFR (Non-African Amer.): 46 — ABNORMAL LOW
GFR CALC AF AMER: 53 — AB
GLUCOSE: 114 mg/dL — AB (ref 65–99)
Osmolality: 274 (ref 275–301)
POTASSIUM: 3.6 mmol/L (ref 3.5–5.1)
SGOT(AST): 16 U/L (ref 15–37)
SODIUM: 136 mmol/L (ref 136–145)
Total Protein: 7.5 g/dL (ref 6.4–8.2)

## 2013-06-25 NOTE — Telephone Encounter (Signed)
Spoke with Caroline Russo  She states that the fluid was sent to [address] (outside lab)  She will have them deliver fluid today so that they can run the Cytology  Results should be in tomorrow  Nothing further needed

## 2013-06-25 NOTE — Telephone Encounter (Signed)
I already spoke with [facility] today  There was a mix up on where the fluid for Cytology was sent to, and they are going to retreive the fluid today so results should be in tomorrow  See phone note dated 06/25/13 for specific details thanks!

## 2013-06-25 NOTE — Telephone Encounter (Signed)
Seems awfully slow coming back, see if we can call/ expedite the cytology report

## 2013-06-25 NOTE — Telephone Encounter (Signed)
Spoke with Langley Gauss with [facility]  She states that that they never received the pleural fluid for cytology that was supposed to have been sent 06/19/13  I called and spoke with Cassie at the [facility] lab and she states that she personally delivered the fluid to [facility] 06/19/13  I made [facility] aware of this and she states will have to investigate further- I gave her number to call and communicate with Cassie  I have also called [facility] and had them fax me results on the LDH, cell count, protein and glucose  I confirmed with [facility] that they did not receive fluid for Cytology  Will hold in my basket until I hear back from [facility]

## 2013-06-26 ENCOUNTER — Encounter: Payer: Self-pay | Admitting: Internal Medicine

## 2013-06-26 LAB — CANCER ANTIGEN 19-9: CA 19 9: 19 U/mL (ref 0–35)

## 2013-06-26 LAB — CA 125: CA 125: 568.4 U/mL — ABNORMAL HIGH (ref 0.0–34.0)

## 2013-06-26 LAB — CEA: CEA: 1.6 ng/mL (ref 0.0–4.7)

## 2013-06-26 NOTE — Progress Notes (Signed)
Quick Note:  Those results are in your lookat for review ______

## 2013-06-27 ENCOUNTER — Telehealth: Payer: Self-pay | Admitting: Internal Medicine

## 2013-06-27 ENCOUNTER — Encounter: Payer: Self-pay | Admitting: Internal Medicine

## 2013-06-27 DIAGNOSIS — J90 Pleural effusion, not elsewhere classified: Secondary | ICD-10-CM | POA: Diagnosis not present

## 2013-06-27 DIAGNOSIS — R942 Abnormal results of pulmonary function studies: Secondary | ICD-10-CM | POA: Diagnosis not present

## 2013-06-27 DIAGNOSIS — R971 Elevated cancer antigen 125 [CA 125]: Secondary | ICD-10-CM | POA: Diagnosis not present

## 2013-06-27 DIAGNOSIS — R1900 Intra-abdominal and pelvic swelling, mass and lump, unspecified site: Secondary | ICD-10-CM | POA: Diagnosis not present

## 2013-06-27 DIAGNOSIS — R0602 Shortness of breath: Secondary | ICD-10-CM | POA: Diagnosis not present

## 2013-06-27 DIAGNOSIS — R599 Enlarged lymph nodes, unspecified: Secondary | ICD-10-CM | POA: Diagnosis not present

## 2013-06-27 NOTE — Telephone Encounter (Signed)
Cancelled appt

## 2013-06-27 NOTE — Telephone Encounter (Signed)
Called spoke with Jarrett Soho. She reports they are looking for the pathology/cytology results. I advised her according to phone note yesterday we are still awaiting these results as well. Nothing further needed

## 2013-06-27 NOTE — Telephone Encounter (Signed)
Cancel f/u ov - w/u for ? Carcinomatosis is per [place] for now - discussed with daughter

## 2013-06-27 NOTE — Telephone Encounter (Signed)
[name] returned call & can be reached at [phone].  Caroline Russo

## 2013-06-27 NOTE — Telephone Encounter (Signed)
I called daughter, [name] with labs  She wants to speak with Dr Melvyn Novas She is confused about pt's dx and why pt is seeing oncology  I have scheduled pt for ov in 2 wks  Please call her at [phone] Thanks!!

## 2013-06-27 NOTE — Progress Notes (Signed)
Quick Note:  Pt's daughter notified ______

## 2013-06-28 ENCOUNTER — Ambulatory Visit: Payer: Self-pay | Admitting: Oncology

## 2013-06-28 ENCOUNTER — Other Ambulatory Visit: Payer: Medicare Other

## 2013-06-28 DIAGNOSIS — J90 Pleural effusion, not elsewhere classified: Secondary | ICD-10-CM | POA: Diagnosis not present

## 2013-06-28 DIAGNOSIS — I2699 Other pulmonary embolism without acute cor pulmonale: Secondary | ICD-10-CM | POA: Diagnosis not present

## 2013-06-28 DIAGNOSIS — Z930 Tracheostomy status: Secondary | ICD-10-CM | POA: Diagnosis not present

## 2013-07-03 DIAGNOSIS — C786 Secondary malignant neoplasm of retroperitoneum and peritoneum: Secondary | ICD-10-CM | POA: Diagnosis not present

## 2013-07-03 DIAGNOSIS — C801 Malignant (primary) neoplasm, unspecified: Secondary | ICD-10-CM | POA: Diagnosis not present

## 2013-07-08 ENCOUNTER — Telehealth: Payer: Self-pay | Admitting: Internal Medicine

## 2013-07-08 ENCOUNTER — Telehealth: Payer: Self-pay | Admitting: *Deleted

## 2013-07-08 NOTE — Telephone Encounter (Signed)
Please call her daughter regarding. echocardiogram

## 2013-07-08 NOTE — Telephone Encounter (Signed)
Spoke with the pt's daughter [name]  She states that pt has had another thoracentesis since last seen and "fluid keeps coming back" She also states that nobody knows where pt's CA started, and "what to do" Pt is going to see Dr Jeb Levering in [place] tomorrow (oncology) She is asking if Dr Melvyn Novas thinks that lymph nodes or pulmonary nodule should be biopsied Please advise thanks!

## 2013-07-08 NOTE — Telephone Encounter (Signed)
Spoke w/ [name].  She reports that pt continues to have fluid drawn off of her lungs,  Reports they have not found the source of cancer yet, as each time fluid shows no malignancy. She is concerned that each time fluid is drawn off, it comes back, causing SOB. Pt previously cancelled echo due to a conflicting appt w/ oncology. She would like to know if Dr. Rockey Situ would recommend that this be rescheduled to see if fluid is coming from HF. Please advise. Thank you.

## 2013-07-08 NOTE — Telephone Encounter (Signed)
Discussed with pts daugther defer best approach to oncology with T surgery available for either vats or pleurex placement

## 2013-07-09 ENCOUNTER — Ambulatory Visit: Payer: Self-pay | Admitting: Gastroenterology

## 2013-07-09 DIAGNOSIS — C482 Malignant neoplasm of peritoneum, unspecified: Secondary | ICD-10-CM | POA: Diagnosis not present

## 2013-07-09 DIAGNOSIS — I10 Essential (primary) hypertension: Secondary | ICD-10-CM | POA: Diagnosis not present

## 2013-07-09 DIAGNOSIS — Z87891 Personal history of nicotine dependence: Secondary | ICD-10-CM | POA: Diagnosis not present

## 2013-07-09 DIAGNOSIS — K5730 Diverticulosis of large intestine without perforation or abscess without bleeding: Secondary | ICD-10-CM | POA: Diagnosis not present

## 2013-07-09 DIAGNOSIS — F3289 Other specified depressive episodes: Secondary | ICD-10-CM | POA: Diagnosis not present

## 2013-07-09 DIAGNOSIS — F329 Major depressive disorder, single episode, unspecified: Secondary | ICD-10-CM | POA: Diagnosis not present

## 2013-07-09 DIAGNOSIS — Z79899 Other long term (current) drug therapy: Secondary | ICD-10-CM | POA: Diagnosis not present

## 2013-07-09 DIAGNOSIS — E119 Type 2 diabetes mellitus without complications: Secondary | ICD-10-CM | POA: Diagnosis not present

## 2013-07-09 DIAGNOSIS — Z880 Allergy status to penicillin: Secondary | ICD-10-CM | POA: Diagnosis not present

## 2013-07-09 DIAGNOSIS — K648 Other hemorrhoids: Secondary | ICD-10-CM | POA: Diagnosis not present

## 2013-07-09 DIAGNOSIS — Z888 Allergy status to other drugs, medicaments and biological substances status: Secondary | ICD-10-CM | POA: Diagnosis not present

## 2013-07-09 DIAGNOSIS — K449 Diaphragmatic hernia without obstruction or gangrene: Secondary | ICD-10-CM | POA: Diagnosis not present

## 2013-07-09 DIAGNOSIS — Z800 Family history of malignant neoplasm of digestive organs: Secondary | ICD-10-CM | POA: Diagnosis not present

## 2013-07-09 DIAGNOSIS — K222 Esophageal obstruction: Secondary | ICD-10-CM | POA: Diagnosis not present

## 2013-07-09 DIAGNOSIS — F411 Generalized anxiety disorder: Secondary | ICD-10-CM | POA: Diagnosis not present

## 2013-07-09 DIAGNOSIS — Z0389 Encounter for observation for other suspected diseases and conditions ruled out: Secondary | ICD-10-CM | POA: Diagnosis not present

## 2013-07-09 DIAGNOSIS — K219 Gastro-esophageal reflux disease without esophagitis: Secondary | ICD-10-CM | POA: Diagnosis not present

## 2013-07-09 DIAGNOSIS — C786 Secondary malignant neoplasm of retroperitoneum and peritoneum: Secondary | ICD-10-CM | POA: Diagnosis not present

## 2013-07-09 DIAGNOSIS — Z8052 Family history of malignant neoplasm of bladder: Secondary | ICD-10-CM | POA: Diagnosis not present

## 2013-07-09 DIAGNOSIS — I498 Other specified cardiac arrhythmias: Secondary | ICD-10-CM | POA: Diagnosis not present

## 2013-07-09 NOTE — Telephone Encounter (Signed)
He has the can reschedule the echocardiogram at their convenience This will help guide any use of diuretics  Other causes can contribute to leg edema such as anemia and poor nutrition

## 2013-07-10 NOTE — Telephone Encounter (Signed)
Spoke w/ [name].  Sched for ECHO 07/23/13 @ 12:00. She reports that pt had colonoscopy & endoscopy yesterday, which came back negative. Reports pt had a good night last night and is feeling better.  She will call w/ further questions or concerns.

## 2013-07-10 NOTE — Telephone Encounter (Signed)
Left message for Caroline Russo to call back

## 2013-07-11 ENCOUNTER — Ambulatory Visit: Payer: Medicare Other | Admitting: Internal Medicine

## 2013-07-11 ENCOUNTER — Ambulatory Visit: Payer: Self-pay | Admitting: Cardiothoracic Surgery

## 2013-07-11 DIAGNOSIS — R1900 Intra-abdominal and pelvic swelling, mass and lump, unspecified site: Secondary | ICD-10-CM | POA: Diagnosis not present

## 2013-07-11 DIAGNOSIS — R935 Abnormal findings on diagnostic imaging of other abdominal regions, including retroperitoneum: Secondary | ICD-10-CM | POA: Diagnosis not present

## 2013-07-11 DIAGNOSIS — R971 Elevated cancer antigen 125 [CA 125]: Secondary | ICD-10-CM | POA: Diagnosis not present

## 2013-07-11 DIAGNOSIS — J449 Chronic obstructive pulmonary disease, unspecified: Secondary | ICD-10-CM | POA: Diagnosis not present

## 2013-07-11 DIAGNOSIS — R0602 Shortness of breath: Secondary | ICD-10-CM | POA: Diagnosis not present

## 2013-07-11 DIAGNOSIS — R599 Enlarged lymph nodes, unspecified: Secondary | ICD-10-CM | POA: Diagnosis not present

## 2013-07-11 DIAGNOSIS — R942 Abnormal results of pulmonary function studies: Secondary | ICD-10-CM | POA: Diagnosis not present

## 2013-07-11 DIAGNOSIS — J90 Pleural effusion, not elsewhere classified: Secondary | ICD-10-CM | POA: Diagnosis not present

## 2013-07-11 LAB — COMPREHENSIVE METABOLIC PANEL
ALBUMIN: 3.3 g/dL — AB (ref 3.4–5.0)
ALT: 16 U/L (ref 12–78)
Alkaline Phosphatase: 98 U/L
Anion Gap: 8 (ref 7–16)
BILIRUBIN TOTAL: 0.5 mg/dL (ref 0.2–1.0)
BUN: 17 mg/dL (ref 7–18)
CALCIUM: 9.5 mg/dL (ref 8.5–10.1)
CHLORIDE: 97 mmol/L — AB (ref 98–107)
CREATININE: 1.2 mg/dL (ref 0.60–1.30)
Co2: 30 mmol/L (ref 21–32)
EGFR (Non-African Amer.): 39 — ABNORMAL LOW
GFR CALC AF AMER: 45 — AB
GLUCOSE: 158 mg/dL — AB (ref 65–99)
Osmolality: 275 (ref 275–301)
POTASSIUM: 4.3 mmol/L (ref 3.5–5.1)
SGOT(AST): 17 U/L (ref 15–37)
Sodium: 135 mmol/L — ABNORMAL LOW (ref 136–145)
Total Protein: 7.3 g/dL (ref 6.4–8.2)

## 2013-07-11 LAB — CBC CANCER CENTER
Basophil #: 0.1 x10 3/mm (ref 0.0–0.1)
Basophil %: 1.1 %
EOS PCT: 2.2 %
Eosinophil #: 0.2 x10 3/mm (ref 0.0–0.7)
HCT: 46.4 % (ref 35.0–47.0)
HGB: 15.2 g/dL (ref 12.0–16.0)
Lymphocyte #: 1.7 x10 3/mm (ref 1.0–3.6)
Lymphocyte %: 23.4 %
MCH: 28.6 pg (ref 26.0–34.0)
MCHC: 32.7 g/dL (ref 32.0–36.0)
MCV: 87 fL (ref 80–100)
Monocyte #: 0.8 x10 3/mm (ref 0.2–0.9)
Monocyte %: 11.2 %
Neutrophil #: 4.5 x10 3/mm (ref 1.4–6.5)
Neutrophil %: 62.1 %
Platelet: 231 x10 3/mm (ref 150–440)
RBC: 5.31 10*6/uL — ABNORMAL HIGH (ref 3.80–5.20)
RDW: 13.4 % (ref 11.5–14.5)
WBC: 7.2 x10 3/mm (ref 3.6–11.0)

## 2013-07-11 LAB — PROTIME-INR
INR: 1
Prothrombin Time: 13 secs (ref 11.5–14.7)

## 2013-07-11 LAB — APTT: ACTIVATED PTT: 31.7 s (ref 23.6–35.9)

## 2013-07-12 LAB — CA 125: CA 125: 612.3 U/mL — ABNORMAL HIGH (ref 0.0–34.0)

## 2013-07-15 ENCOUNTER — Ambulatory Visit: Payer: Self-pay | Admitting: Oncology

## 2013-07-15 DIAGNOSIS — R599 Enlarged lymph nodes, unspecified: Secondary | ICD-10-CM | POA: Diagnosis not present

## 2013-07-15 DIAGNOSIS — J90 Pleural effusion, not elsewhere classified: Secondary | ICD-10-CM | POA: Diagnosis not present

## 2013-07-16 ENCOUNTER — Telehealth: Payer: Self-pay | Admitting: *Deleted

## 2013-07-16 NOTE — Telephone Encounter (Signed)
Please call daughter. She is wanting to know if she should increase fluid pills due to the fluid in her mother's  lungs. She had fluid drawn off yesterday at [facility] by Dr. Dwaine Deter

## 2013-07-16 NOTE — Telephone Encounter (Signed)
Spoke w/ pt's daughter, [name].  She reports that pt is having fluid drawn from lungs quite regularly. Reports that she feels great for 2-3 days afterwards, but then feels bad again.  She would like to know if HCTZ can be adjusted or possibly another diuretic to pull fluids off b/t procedures.  Pt sched for ECHO on 07/23/13 in our office.  Reports that testing has mostly been inconclusive, but suggestive of ovarian ca, sched for biopsy next week.

## 2013-07-18 ENCOUNTER — Ambulatory Visit: Payer: Self-pay | Admitting: Oncology

## 2013-07-18 DIAGNOSIS — Z885 Allergy status to narcotic agent status: Secondary | ICD-10-CM | POA: Diagnosis not present

## 2013-07-18 DIAGNOSIS — Z881 Allergy status to other antibiotic agents status: Secondary | ICD-10-CM | POA: Diagnosis not present

## 2013-07-18 DIAGNOSIS — I4891 Unspecified atrial fibrillation: Secondary | ICD-10-CM | POA: Diagnosis not present

## 2013-07-18 DIAGNOSIS — Z87891 Personal history of nicotine dependence: Secondary | ICD-10-CM | POA: Diagnosis not present

## 2013-07-18 DIAGNOSIS — Z800 Family history of malignant neoplasm of digestive organs: Secondary | ICD-10-CM | POA: Diagnosis not present

## 2013-07-18 DIAGNOSIS — C8293 Follicular lymphoma, unspecified, intra-abdominal lymph nodes: Secondary | ICD-10-CM | POA: Diagnosis not present

## 2013-07-18 DIAGNOSIS — Z8051 Family history of malignant neoplasm of kidney: Secondary | ICD-10-CM | POA: Diagnosis not present

## 2013-07-18 DIAGNOSIS — Z880 Allergy status to penicillin: Secondary | ICD-10-CM | POA: Diagnosis not present

## 2013-07-18 DIAGNOSIS — I498 Other specified cardiac arrhythmias: Secondary | ICD-10-CM | POA: Diagnosis not present

## 2013-07-18 DIAGNOSIS — R0602 Shortness of breath: Secondary | ICD-10-CM | POA: Diagnosis not present

## 2013-07-18 DIAGNOSIS — R599 Enlarged lymph nodes, unspecified: Secondary | ICD-10-CM | POA: Diagnosis not present

## 2013-07-18 DIAGNOSIS — Z888 Allergy status to other drugs, medicaments and biological substances status: Secondary | ICD-10-CM | POA: Diagnosis not present

## 2013-07-18 NOTE — Telephone Encounter (Signed)
Spoke w/ pt's daughter.  Advised her of Dr. Donivan Scull recommendation.  She is agreeable and will keep appt on 07/23/13 for ECHO.

## 2013-07-18 NOTE — Telephone Encounter (Signed)
Would wait to see results of echo first before changing her diuretic. If high pressure on the echo indicating fluid overload, we would certainly using more aggressive diuretic

## 2013-07-23 ENCOUNTER — Other Ambulatory Visit: Payer: Self-pay

## 2013-07-23 ENCOUNTER — Other Ambulatory Visit (INDEPENDENT_AMBULATORY_CARE_PROVIDER_SITE_OTHER): Payer: Medicare Other

## 2013-07-23 DIAGNOSIS — I059 Rheumatic mitral valve disease, unspecified: Secondary | ICD-10-CM | POA: Diagnosis not present

## 2013-07-23 DIAGNOSIS — R000 Tachycardia, unspecified: Secondary | ICD-10-CM

## 2013-07-23 DIAGNOSIS — R0602 Shortness of breath: Secondary | ICD-10-CM

## 2013-07-23 DIAGNOSIS — J90 Pleural effusion, not elsewhere classified: Secondary | ICD-10-CM

## 2013-07-23 LAB — PATHOLOGY REPORT

## 2013-07-24 ENCOUNTER — Ambulatory Visit: Payer: Self-pay | Admitting: Oncology

## 2013-07-24 DIAGNOSIS — C8589 Other specified types of non-Hodgkin lymphoma, extranodal and solid organ sites: Secondary | ICD-10-CM | POA: Diagnosis not present

## 2013-07-25 ENCOUNTER — Ambulatory Visit: Payer: Self-pay | Admitting: Oncology

## 2013-07-25 DIAGNOSIS — Z888 Allergy status to other drugs, medicaments and biological substances status: Secondary | ICD-10-CM | POA: Diagnosis not present

## 2013-07-25 DIAGNOSIS — Z800 Family history of malignant neoplasm of digestive organs: Secondary | ICD-10-CM | POA: Diagnosis not present

## 2013-07-25 DIAGNOSIS — Z881 Allergy status to other antibiotic agents status: Secondary | ICD-10-CM | POA: Diagnosis not present

## 2013-07-25 DIAGNOSIS — K219 Gastro-esophageal reflux disease without esophagitis: Secondary | ICD-10-CM | POA: Diagnosis not present

## 2013-07-25 DIAGNOSIS — J90 Pleural effusion, not elsewhere classified: Secondary | ICD-10-CM | POA: Diagnosis not present

## 2013-07-25 DIAGNOSIS — Z794 Long term (current) use of insulin: Secondary | ICD-10-CM | POA: Diagnosis not present

## 2013-07-25 DIAGNOSIS — J910 Malignant pleural effusion: Secondary | ICD-10-CM | POA: Diagnosis not present

## 2013-07-25 DIAGNOSIS — Z880 Allergy status to penicillin: Secondary | ICD-10-CM | POA: Diagnosis not present

## 2013-07-25 DIAGNOSIS — R0602 Shortness of breath: Secondary | ICD-10-CM | POA: Diagnosis not present

## 2013-07-25 DIAGNOSIS — I498 Other specified cardiac arrhythmias: Secondary | ICD-10-CM | POA: Diagnosis not present

## 2013-07-25 DIAGNOSIS — I10 Essential (primary) hypertension: Secondary | ICD-10-CM | POA: Diagnosis not present

## 2013-07-25 DIAGNOSIS — Z885 Allergy status to narcotic agent status: Secondary | ICD-10-CM | POA: Diagnosis not present

## 2013-07-25 DIAGNOSIS — C8589 Other specified types of non-Hodgkin lymphoma, extranodal and solid organ sites: Secondary | ICD-10-CM | POA: Diagnosis not present

## 2013-07-25 DIAGNOSIS — Z87891 Personal history of nicotine dependence: Secondary | ICD-10-CM | POA: Diagnosis not present

## 2013-07-25 DIAGNOSIS — E109 Type 1 diabetes mellitus without complications: Secondary | ICD-10-CM | POA: Diagnosis not present

## 2013-07-25 DIAGNOSIS — J9819 Other pulmonary collapse: Secondary | ICD-10-CM | POA: Diagnosis not present

## 2013-07-29 ENCOUNTER — Telehealth: Payer: Self-pay

## 2013-07-29 NOTE — Telephone Encounter (Signed)
Pt would like echo results. Please call. 

## 2013-07-29 NOTE — Telephone Encounter (Signed)
Do you want to change her diuretics based on ECHO?

## 2013-07-30 NOTE — Telephone Encounter (Signed)
Should be okay to proceed with treatments tomorrow Echocardiogram shows normal left ventricular function Surprisingly she has stenosis of her mitral valve If IV fluids are needed for her treatments, she will need to tell them to use IV fluids cautiously as to much fluids could cause shortness of breath

## 2013-07-30 NOTE — Telephone Encounter (Signed)
Pt called stating that she is to receive her first treatment tomorrow and she wanted to make sure that her heart is okay to proceed, as she is sched to be there at 8:00. Rituximab is the med that she will be receiving in a 5 hour treatment.  Advised her that if Dr. Oliva Bustard were concerned about giving her a med, he would contact Dr. Rockey Situ.  She states that she would appreciate knowing that Dr. Rockey Situ approves of her proceeding w/ treatment.  Please advise.  Thank you.

## 2013-07-30 NOTE — Telephone Encounter (Signed)
Spoke w/ [name].  Advised her of Dr. Donivan Scull recommendation.   She is agreeable and will call w/ further questions or concerns.

## 2013-07-31 ENCOUNTER — Ambulatory Visit: Payer: Self-pay | Admitting: Cardiothoracic Surgery

## 2013-07-31 DIAGNOSIS — Z8051 Family history of malignant neoplasm of kidney: Secondary | ICD-10-CM | POA: Diagnosis not present

## 2013-07-31 DIAGNOSIS — R5381 Other malaise: Secondary | ICD-10-CM | POA: Diagnosis not present

## 2013-07-31 DIAGNOSIS — I10 Essential (primary) hypertension: Secondary | ICD-10-CM | POA: Diagnosis not present

## 2013-07-31 DIAGNOSIS — Z9089 Acquired absence of other organs: Secondary | ICD-10-CM | POA: Diagnosis not present

## 2013-07-31 DIAGNOSIS — J90 Pleural effusion, not elsewhere classified: Secondary | ICD-10-CM | POA: Diagnosis not present

## 2013-07-31 DIAGNOSIS — R630 Anorexia: Secondary | ICD-10-CM | POA: Diagnosis not present

## 2013-07-31 DIAGNOSIS — F341 Dysthymic disorder: Secondary | ICD-10-CM | POA: Diagnosis not present

## 2013-07-31 DIAGNOSIS — M549 Dorsalgia, unspecified: Secondary | ICD-10-CM | POA: Diagnosis not present

## 2013-07-31 DIAGNOSIS — Z79899 Other long term (current) drug therapy: Secondary | ICD-10-CM | POA: Diagnosis not present

## 2013-07-31 DIAGNOSIS — R51 Headache: Secondary | ICD-10-CM | POA: Diagnosis not present

## 2013-07-31 DIAGNOSIS — Z90710 Acquired absence of both cervix and uterus: Secondary | ICD-10-CM | POA: Diagnosis not present

## 2013-07-31 DIAGNOSIS — Z87891 Personal history of nicotine dependence: Secondary | ICD-10-CM | POA: Diagnosis not present

## 2013-07-31 DIAGNOSIS — R0602 Shortness of breath: Secondary | ICD-10-CM | POA: Diagnosis not present

## 2013-07-31 DIAGNOSIS — R5383 Other fatigue: Secondary | ICD-10-CM | POA: Diagnosis not present

## 2013-07-31 DIAGNOSIS — I498 Other specified cardiac arrhythmias: Secondary | ICD-10-CM | POA: Diagnosis not present

## 2013-07-31 DIAGNOSIS — Z800 Family history of malignant neoplasm of digestive organs: Secondary | ICD-10-CM | POA: Diagnosis not present

## 2013-07-31 DIAGNOSIS — IMO0001 Reserved for inherently not codable concepts without codable children: Secondary | ICD-10-CM | POA: Diagnosis not present

## 2013-07-31 DIAGNOSIS — M2550 Pain in unspecified joint: Secondary | ICD-10-CM | POA: Diagnosis not present

## 2013-07-31 DIAGNOSIS — R011 Cardiac murmur, unspecified: Secondary | ICD-10-CM | POA: Diagnosis not present

## 2013-07-31 DIAGNOSIS — C8589 Other specified types of non-Hodgkin lymphoma, extranodal and solid organ sites: Secondary | ICD-10-CM | POA: Diagnosis not present

## 2013-07-31 DIAGNOSIS — K219 Gastro-esophageal reflux disease without esophagitis: Secondary | ICD-10-CM | POA: Diagnosis not present

## 2013-07-31 DIAGNOSIS — R971 Elevated cancer antigen 125 [CA 125]: Secondary | ICD-10-CM | POA: Diagnosis not present

## 2013-07-31 DIAGNOSIS — R110 Nausea: Secondary | ICD-10-CM | POA: Diagnosis not present

## 2013-07-31 DIAGNOSIS — Z5111 Encounter for antineoplastic chemotherapy: Secondary | ICD-10-CM | POA: Diagnosis not present

## 2013-07-31 DIAGNOSIS — E119 Type 2 diabetes mellitus without complications: Secondary | ICD-10-CM | POA: Diagnosis not present

## 2013-07-31 LAB — COMPREHENSIVE METABOLIC PANEL
ALK PHOS: 88 U/L
ANION GAP: 8 (ref 7–16)
Albumin: 2.7 g/dL — ABNORMAL LOW (ref 3.4–5.0)
BUN: 16 mg/dL (ref 7–18)
Bilirubin,Total: 0.5 mg/dL (ref 0.2–1.0)
Calcium, Total: 8.4 mg/dL — ABNORMAL LOW (ref 8.5–10.1)
Chloride: 98 mmol/L (ref 98–107)
Co2: 28 mmol/L (ref 21–32)
Creatinine: 1.15 mg/dL (ref 0.60–1.30)
EGFR (Non-African Amer.): 41 — ABNORMAL LOW
GFR CALC AF AMER: 48 — AB
GLUCOSE: 176 mg/dL — AB (ref 65–99)
Osmolality: 274 (ref 275–301)
Potassium: 4.3 mmol/L (ref 3.5–5.1)
SGOT(AST): 22 U/L (ref 15–37)
SGPT (ALT): 13 U/L (ref 12–78)
SODIUM: 134 mmol/L — AB (ref 136–145)
Total Protein: 6.3 g/dL — ABNORMAL LOW (ref 6.4–8.2)

## 2013-07-31 LAB — CBC CANCER CENTER
Basophil #: 0.1 x10 3/mm (ref 0.0–0.1)
Basophil %: 1 %
Eosinophil #: 0.2 x10 3/mm (ref 0.0–0.7)
Eosinophil %: 2.4 %
HCT: 44.8 % (ref 35.0–47.0)
HGB: 14.6 g/dL (ref 12.0–16.0)
Lymphocyte #: 1.8 x10 3/mm (ref 1.0–3.6)
Lymphocyte %: 22.6 %
MCH: 28.2 pg (ref 26.0–34.0)
MCHC: 32.6 g/dL (ref 32.0–36.0)
MCV: 87 fL (ref 80–100)
Monocyte #: 1 x10 3/mm — ABNORMAL HIGH (ref 0.2–0.9)
Monocyte %: 12.2 %
NEUTROS PCT: 61.8 %
Neutrophil #: 5 x10 3/mm (ref 1.4–6.5)
PLATELETS: 218 x10 3/mm (ref 150–440)
RBC: 5.17 10*6/uL (ref 3.80–5.20)
RDW: 13.9 % (ref 11.5–14.5)
WBC: 8.2 x10 3/mm (ref 3.6–11.0)

## 2013-07-31 LAB — LACTATE DEHYDROGENASE: LDH: 185 U/L (ref 81–246)

## 2013-08-05 NOTE — Telephone Encounter (Signed)
This encounter was created in error - please disregard.

## 2013-08-07 DIAGNOSIS — C8589 Other specified types of non-Hodgkin lymphoma, extranodal and solid organ sites: Secondary | ICD-10-CM | POA: Diagnosis not present

## 2013-08-07 DIAGNOSIS — J90 Pleural effusion, not elsewhere classified: Secondary | ICD-10-CM | POA: Diagnosis not present

## 2013-08-07 LAB — CBC CANCER CENTER
BASOS PCT: 0.4 %
Basophil #: 0.1 x10 3/mm (ref 0.0–0.1)
EOS ABS: 0.2 x10 3/mm (ref 0.0–0.7)
EOS PCT: 1.5 %
HCT: 47.4 % — ABNORMAL HIGH (ref 35.0–47.0)
HGB: 15.5 g/dL (ref 12.0–16.0)
LYMPHS PCT: 17.5 %
Lymphocyte #: 2.1 x10 3/mm (ref 1.0–3.6)
MCH: 28.3 pg (ref 26.0–34.0)
MCHC: 32.7 g/dL (ref 32.0–36.0)
MCV: 87 fL (ref 80–100)
Monocyte #: 1.3 x10 3/mm — ABNORMAL HIGH (ref 0.2–0.9)
Monocyte %: 10.9 %
NEUTROS ABS: 8.5 x10 3/mm — AB (ref 1.4–6.5)
NEUTROS PCT: 69.7 %
Platelet: 266 x10 3/mm (ref 150–440)
RBC: 5.46 10*6/uL — ABNORMAL HIGH (ref 3.80–5.20)
RDW: 14 % (ref 11.5–14.5)
WBC: 12.2 x10 3/mm — ABNORMAL HIGH (ref 3.6–11.0)

## 2013-08-07 LAB — COMPREHENSIVE METABOLIC PANEL
ALT: 26 U/L (ref 12–78)
ANION GAP: 8 (ref 7–16)
Albumin: 3 g/dL — ABNORMAL LOW (ref 3.4–5.0)
Alkaline Phosphatase: 77 U/L
BILIRUBIN TOTAL: 0.5 mg/dL (ref 0.2–1.0)
BUN: 16 mg/dL (ref 7–18)
CALCIUM: 8.3 mg/dL — AB (ref 8.5–10.1)
CO2: 30 mmol/L (ref 21–32)
CREATININE: 1.11 mg/dL (ref 0.60–1.30)
Chloride: 97 mmol/L — ABNORMAL LOW (ref 98–107)
EGFR (Non-African Amer.): 43 — ABNORMAL LOW
GFR CALC AF AMER: 50 — AB
GLUCOSE: 227 mg/dL — AB (ref 65–99)
OSMOLALITY: 278 (ref 275–301)
Potassium: 3.9 mmol/L (ref 3.5–5.1)
SGOT(AST): 17 U/L (ref 15–37)
SODIUM: 135 mmol/L — AB (ref 136–145)
TOTAL PROTEIN: 6.5 g/dL (ref 6.4–8.2)

## 2013-08-11 ENCOUNTER — Emergency Department: Payer: Self-pay | Admitting: Emergency Medicine

## 2013-08-11 DIAGNOSIS — R5381 Other malaise: Secondary | ICD-10-CM | POA: Diagnosis not present

## 2013-08-11 DIAGNOSIS — R42 Dizziness and giddiness: Secondary | ICD-10-CM | POA: Diagnosis not present

## 2013-08-11 DIAGNOSIS — H5316 Psychophysical visual disturbances: Secondary | ICD-10-CM | POA: Diagnosis not present

## 2013-08-11 DIAGNOSIS — N390 Urinary tract infection, site not specified: Secondary | ICD-10-CM | POA: Diagnosis not present

## 2013-08-11 DIAGNOSIS — R443 Hallucinations, unspecified: Secondary | ICD-10-CM | POA: Diagnosis not present

## 2013-08-11 DIAGNOSIS — Z79899 Other long term (current) drug therapy: Secondary | ICD-10-CM | POA: Diagnosis not present

## 2013-08-11 DIAGNOSIS — Z9889 Other specified postprocedural states: Secondary | ICD-10-CM | POA: Diagnosis not present

## 2013-08-11 DIAGNOSIS — F29 Unspecified psychosis not due to a substance or known physiological condition: Secondary | ICD-10-CM | POA: Diagnosis not present

## 2013-08-11 DIAGNOSIS — E119 Type 2 diabetes mellitus without complications: Secondary | ICD-10-CM | POA: Diagnosis not present

## 2013-08-11 DIAGNOSIS — R5383 Other fatigue: Secondary | ICD-10-CM | POA: Diagnosis not present

## 2013-08-11 DIAGNOSIS — Z9079 Acquired absence of other genital organ(s): Secondary | ICD-10-CM | POA: Diagnosis not present

## 2013-08-11 LAB — CBC
HCT: 47.9 % — ABNORMAL HIGH (ref 35.0–47.0)
HGB: 15.5 g/dL (ref 12.0–16.0)
MCH: 28.4 pg (ref 26.0–34.0)
MCHC: 32.4 g/dL (ref 32.0–36.0)
MCV: 88 fL (ref 80–100)
PLATELETS: 245 10*3/uL (ref 150–440)
RBC: 5.46 10*6/uL — ABNORMAL HIGH (ref 3.80–5.20)
RDW: 14.5 % (ref 11.5–14.5)
WBC: 10.3 10*3/uL (ref 3.6–11.0)

## 2013-08-11 LAB — COMPREHENSIVE METABOLIC PANEL
ALBUMIN: 3.4 g/dL (ref 3.4–5.0)
ALT: 23 U/L (ref 12–78)
AST: 21 U/L (ref 15–37)
Alkaline Phosphatase: 77 U/L
Anion Gap: 11 (ref 7–16)
BILIRUBIN TOTAL: 0.7 mg/dL (ref 0.2–1.0)
BUN: 13 mg/dL (ref 7–18)
CO2: 27 mmol/L (ref 21–32)
Calcium, Total: 8.9 mg/dL (ref 8.5–10.1)
Chloride: 95 mmol/L — ABNORMAL LOW (ref 98–107)
Creatinine: 1.07 mg/dL (ref 0.60–1.30)
EGFR (African American): 52 — ABNORMAL LOW
EGFR (Non-African Amer.): 45 — ABNORMAL LOW
Glucose: 174 mg/dL — ABNORMAL HIGH (ref 65–99)
Osmolality: 271 (ref 275–301)
Potassium: 3.6 mmol/L (ref 3.5–5.1)
Sodium: 133 mmol/L — ABNORMAL LOW (ref 136–145)
Total Protein: 6.9 g/dL (ref 6.4–8.2)

## 2013-08-11 LAB — URINALYSIS, COMPLETE
BLOOD: NEGATIVE
Bilirubin,UR: NEGATIVE
Ketone: NEGATIVE
Nitrite: NEGATIVE
PH: 7 (ref 4.5–8.0)
Protein: 30
SPECIFIC GRAVITY: 1.012 (ref 1.003–1.030)
Squamous Epithelial: NONE SEEN
WBC UR: 24 /HPF (ref 0–5)

## 2013-08-11 LAB — TROPONIN I: TROPONIN-I: 0.02 ng/mL

## 2013-08-14 DIAGNOSIS — C8589 Other specified types of non-Hodgkin lymphoma, extranodal and solid organ sites: Secondary | ICD-10-CM | POA: Diagnosis not present

## 2013-08-14 LAB — CBC CANCER CENTER
BASOS ABS: 0.1 x10 3/mm (ref 0.0–0.1)
BASOS PCT: 1.3 %
EOS ABS: 0.1 x10 3/mm (ref 0.0–0.7)
Eosinophil %: 1.3 %
HCT: 44.7 % (ref 35.0–47.0)
HGB: 14.4 g/dL (ref 12.0–16.0)
Lymphocyte #: 1.5 x10 3/mm (ref 1.0–3.6)
Lymphocyte %: 17.7 %
MCH: 28 pg (ref 26.0–34.0)
MCHC: 32.2 g/dL (ref 32.0–36.0)
MCV: 87 fL (ref 80–100)
Monocyte #: 1.1 x10 3/mm — ABNORMAL HIGH (ref 0.2–0.9)
Monocyte %: 13.5 %
NEUTROS PCT: 66.2 %
Neutrophil #: 5.5 x10 3/mm (ref 1.4–6.5)
Platelet: 209 x10 3/mm (ref 150–440)
RBC: 5.14 10*6/uL (ref 3.80–5.20)
RDW: 14.4 % (ref 11.5–14.5)
WBC: 8.3 x10 3/mm (ref 3.6–11.0)

## 2013-08-14 LAB — COMPREHENSIVE METABOLIC PANEL
ALK PHOS: 84 U/L
ALT: 21 U/L
Albumin: 3 g/dL — ABNORMAL LOW (ref 3.4–5.0)
Anion Gap: 7 (ref 7–16)
BILIRUBIN TOTAL: 0.6 mg/dL (ref 0.2–1.0)
BUN: 16 mg/dL (ref 7–18)
CALCIUM: 8.6 mg/dL (ref 8.5–10.1)
Chloride: 98 mmol/L (ref 98–107)
Co2: 30 mmol/L (ref 21–32)
Creatinine: 1.09 mg/dL (ref 0.60–1.30)
GFR CALC AF AMER: 51 — AB
GFR CALC NON AF AMER: 44 — AB
GLUCOSE: 161 mg/dL — AB (ref 65–99)
Osmolality: 275 (ref 275–301)
POTASSIUM: 4.1 mmol/L (ref 3.5–5.1)
SGOT(AST): 19 U/L (ref 15–37)
Sodium: 135 mmol/L — ABNORMAL LOW (ref 136–145)
Total Protein: 6.4 g/dL (ref 6.4–8.2)

## 2013-08-21 DIAGNOSIS — C8589 Other specified types of non-Hodgkin lymphoma, extranodal and solid organ sites: Secondary | ICD-10-CM | POA: Diagnosis not present

## 2013-08-21 DIAGNOSIS — J90 Pleural effusion, not elsewhere classified: Secondary | ICD-10-CM | POA: Diagnosis not present

## 2013-08-21 LAB — COMPREHENSIVE METABOLIC PANEL
ALK PHOS: 94 U/L
Albumin: 3 g/dL — ABNORMAL LOW (ref 3.4–5.0)
Anion Gap: 8 (ref 7–16)
BILIRUBIN TOTAL: 0.4 mg/dL (ref 0.2–1.0)
BUN: 13 mg/dL (ref 7–18)
CALCIUM: 8.8 mg/dL (ref 8.5–10.1)
Chloride: 95 mmol/L — ABNORMAL LOW (ref 98–107)
Co2: 29 mmol/L (ref 21–32)
Creatinine: 1.17 mg/dL (ref 0.60–1.30)
EGFR (African American): 47 — ABNORMAL LOW
GFR CALC NON AF AMER: 40 — AB
Glucose: 228 mg/dL — ABNORMAL HIGH (ref 65–99)
Osmolality: 272 (ref 275–301)
POTASSIUM: 4.2 mmol/L (ref 3.5–5.1)
SGOT(AST): 18 U/L (ref 15–37)
SGPT (ALT): 21 U/L
SODIUM: 132 mmol/L — AB (ref 136–145)
TOTAL PROTEIN: 6.5 g/dL (ref 6.4–8.2)

## 2013-08-21 LAB — CBC CANCER CENTER
BASOS PCT: 1.4 %
Basophil #: 0.1 x10 3/mm (ref 0.0–0.1)
Eosinophil #: 0.1 x10 3/mm (ref 0.0–0.7)
Eosinophil %: 2.4 %
HCT: 43.8 % (ref 35.0–47.0)
HGB: 14 g/dL (ref 12.0–16.0)
LYMPHS PCT: 23.7 %
Lymphocyte #: 1.4 x10 3/mm (ref 1.0–3.6)
MCH: 28.2 pg (ref 26.0–34.0)
MCHC: 32.1 g/dL (ref 32.0–36.0)
MCV: 88 fL (ref 80–100)
Monocyte #: 0.9 x10 3/mm (ref 0.2–0.9)
Monocyte %: 15 %
NEUTROS ABS: 3.4 x10 3/mm (ref 1.4–6.5)
NEUTROS PCT: 57.5 %
PLATELETS: 205 x10 3/mm (ref 150–440)
RBC: 4.98 10*6/uL (ref 3.80–5.20)
RDW: 14.3 % (ref 11.5–14.5)
WBC: 5.9 x10 3/mm (ref 3.6–11.0)

## 2013-08-24 ENCOUNTER — Ambulatory Visit: Payer: Self-pay | Admitting: Cardiothoracic Surgery

## 2013-08-24 ENCOUNTER — Ambulatory Visit: Payer: Self-pay | Admitting: Oncology

## 2013-08-24 DIAGNOSIS — R109 Unspecified abdominal pain: Secondary | ICD-10-CM | POA: Diagnosis not present

## 2013-08-24 DIAGNOSIS — R110 Nausea: Secondary | ICD-10-CM | POA: Diagnosis not present

## 2013-08-24 DIAGNOSIS — Z800 Family history of malignant neoplasm of digestive organs: Secondary | ICD-10-CM | POA: Diagnosis not present

## 2013-08-24 DIAGNOSIS — Z9221 Personal history of antineoplastic chemotherapy: Secondary | ICD-10-CM | POA: Diagnosis not present

## 2013-08-24 DIAGNOSIS — I10 Essential (primary) hypertension: Secondary | ICD-10-CM | POA: Diagnosis not present

## 2013-08-24 DIAGNOSIS — E119 Type 2 diabetes mellitus without complications: Secondary | ICD-10-CM | POA: Diagnosis not present

## 2013-08-24 DIAGNOSIS — Z79899 Other long term (current) drug therapy: Secondary | ICD-10-CM | POA: Diagnosis not present

## 2013-08-24 DIAGNOSIS — R059 Cough, unspecified: Secondary | ICD-10-CM | POA: Diagnosis not present

## 2013-08-24 DIAGNOSIS — K219 Gastro-esophageal reflux disease without esophagitis: Secondary | ICD-10-CM | POA: Diagnosis not present

## 2013-08-24 DIAGNOSIS — Z90710 Acquired absence of both cervix and uterus: Secondary | ICD-10-CM | POA: Diagnosis not present

## 2013-08-24 DIAGNOSIS — Z9089 Acquired absence of other organs: Secondary | ICD-10-CM | POA: Diagnosis not present

## 2013-08-24 DIAGNOSIS — F341 Dysthymic disorder: Secondary | ICD-10-CM | POA: Diagnosis not present

## 2013-08-24 DIAGNOSIS — C8589 Other specified types of non-Hodgkin lymphoma, extranodal and solid organ sites: Secondary | ICD-10-CM | POA: Diagnosis not present

## 2013-08-24 DIAGNOSIS — Z87891 Personal history of nicotine dependence: Secondary | ICD-10-CM | POA: Diagnosis not present

## 2013-08-24 DIAGNOSIS — R011 Cardiac murmur, unspecified: Secondary | ICD-10-CM | POA: Diagnosis not present

## 2013-08-24 DIAGNOSIS — R42 Dizziness and giddiness: Secondary | ICD-10-CM | POA: Diagnosis not present

## 2013-08-24 DIAGNOSIS — R5381 Other malaise: Secondary | ICD-10-CM | POA: Diagnosis not present

## 2013-08-24 DIAGNOSIS — Z8051 Family history of malignant neoplasm of kidney: Secondary | ICD-10-CM | POA: Diagnosis not present

## 2013-08-24 DIAGNOSIS — I498 Other specified cardiac arrhythmias: Secondary | ICD-10-CM | POA: Diagnosis not present

## 2013-08-24 DIAGNOSIS — IMO0001 Reserved for inherently not codable concepts without codable children: Secondary | ICD-10-CM | POA: Diagnosis not present

## 2013-08-24 DIAGNOSIS — K5900 Constipation, unspecified: Secondary | ICD-10-CM | POA: Diagnosis not present

## 2013-08-24 DIAGNOSIS — R5383 Other fatigue: Secondary | ICD-10-CM | POA: Diagnosis not present

## 2013-08-24 DIAGNOSIS — M549 Dorsalgia, unspecified: Secondary | ICD-10-CM | POA: Diagnosis not present

## 2013-08-24 DIAGNOSIS — R630 Anorexia: Secondary | ICD-10-CM | POA: Diagnosis not present

## 2013-08-26 ENCOUNTER — Telehealth: Payer: Self-pay

## 2013-08-26 NOTE — Telephone Encounter (Signed)
Pt daughter states pt is SOB , BP is 148/107, HR 57.

## 2013-08-26 NOTE — Telephone Encounter (Signed)
Spoke w/ pt.  She states that she is feeling better, BP at lunchtime 129/54. Reports that is having more trouble breathing and her next treatment w/ Dr. Oliva Bustard is 10/16/13. She reports that Dr. Oliva Bustard told her not to call her PCP, but to call his office.  She is anxious to get back to the level of activity she had before her dx and is wondering if Dr. Rockey Situ has any suggestions to help w/ her SOB. Please advise.  Thank you.

## 2013-08-26 NOTE — Telephone Encounter (Signed)
Would talk with oncology If she has a pleural effusion that has reaccumulated, This may need a repeat thoracentesis This might be arranged quickly as an outpt after a CXR to confirm

## 2013-08-26 NOTE — Telephone Encounter (Signed)
Spoke w/ pt's daugther.  She reports that pt's 148/107 this am w/ HR 57.  Reports that pt feels fine, denies HA, but states that she is a little tired.  Pt just took her am meds. Advised her to give the meds a chance to get into her system and call me back w/ readings. Discussed normal values of BP, meaning of these values to call back if these numbers remain high, as we don't want to adjust meds based on one reading.  She is agreeable and will call back around lunch time.

## 2013-08-27 NOTE — Telephone Encounter (Signed)
Spoke w/ pt's daughter.  Advised her of Dr. Donivan Scull recommendation.  She states that she called Dr. Metro Kung office last week and was given the same recommendation.  She will call them today to see about getting a chest x-ray if sob worsens or continues.

## 2013-09-17 DIAGNOSIS — C8589 Other specified types of non-Hodgkin lymphoma, extranodal and solid organ sites: Secondary | ICD-10-CM | POA: Diagnosis not present

## 2013-09-17 DIAGNOSIS — K5900 Constipation, unspecified: Secondary | ICD-10-CM | POA: Diagnosis not present

## 2013-09-17 LAB — CBC CANCER CENTER
Basophil #: 0.2 x10 3/mm — ABNORMAL HIGH (ref 0.0–0.1)
Basophil %: 1.9 %
EOS ABS: 0.7 x10 3/mm (ref 0.0–0.7)
Eosinophil %: 7.7 %
HCT: 41.5 % (ref 35.0–47.0)
HGB: 13.5 g/dL (ref 12.0–16.0)
Lymphocyte #: 1.8 x10 3/mm (ref 1.0–3.6)
Lymphocyte %: 20.4 %
MCH: 28.1 pg (ref 26.0–34.0)
MCHC: 32.5 g/dL (ref 32.0–36.0)
MCV: 86 fL (ref 80–100)
Monocyte #: 1.4 x10 3/mm — ABNORMAL HIGH (ref 0.2–0.9)
Monocyte %: 15.9 %
NEUTROS ABS: 4.8 x10 3/mm (ref 1.4–6.5)
NEUTROS PCT: 54.1 %
PLATELETS: 282 x10 3/mm (ref 150–440)
RBC: 4.8 10*6/uL (ref 3.80–5.20)
RDW: 13.6 % (ref 11.5–14.5)
WBC: 8.8 x10 3/mm (ref 3.6–11.0)

## 2013-09-17 LAB — COMPREHENSIVE METABOLIC PANEL
Albumin: 2.8 g/dL — ABNORMAL LOW (ref 3.4–5.0)
Alkaline Phosphatase: 92 U/L
Anion Gap: 9 (ref 7–16)
BILIRUBIN TOTAL: 0.4 mg/dL (ref 0.2–1.0)
BUN: 14 mg/dL (ref 7–18)
CALCIUM: 8.2 mg/dL — AB (ref 8.5–10.1)
CREATININE: 1.12 mg/dL (ref 0.60–1.30)
Chloride: 95 mmol/L — ABNORMAL LOW (ref 98–107)
Co2: 29 mmol/L (ref 21–32)
GFR CALC AF AMER: 49 — AB
GFR CALC NON AF AMER: 43 — AB
Glucose: 182 mg/dL — ABNORMAL HIGH (ref 65–99)
Osmolality: 271 (ref 275–301)
Potassium: 3.6 mmol/L (ref 3.5–5.1)
SGOT(AST): 13 U/L — ABNORMAL LOW (ref 15–37)
SGPT (ALT): 19 U/L
Sodium: 133 mmol/L — ABNORMAL LOW (ref 136–145)
Total Protein: 6.5 g/dL (ref 6.4–8.2)

## 2013-09-17 LAB — LACTATE DEHYDROGENASE: LDH: 169 U/L (ref 81–246)

## 2013-09-24 ENCOUNTER — Ambulatory Visit: Payer: Self-pay | Admitting: Oncology

## 2013-10-01 DIAGNOSIS — E119 Type 2 diabetes mellitus without complications: Secondary | ICD-10-CM | POA: Diagnosis not present

## 2013-10-03 ENCOUNTER — Ambulatory Visit: Payer: Self-pay | Admitting: Oncology

## 2013-10-03 DIAGNOSIS — R948 Abnormal results of function studies of other organs and systems: Secondary | ICD-10-CM | POA: Diagnosis not present

## 2013-10-03 DIAGNOSIS — C8589 Other specified types of non-Hodgkin lymphoma, extranodal and solid organ sites: Secondary | ICD-10-CM | POA: Diagnosis not present

## 2013-10-03 DIAGNOSIS — C8298 Follicular lymphoma, unspecified, lymph nodes of multiple sites: Secondary | ICD-10-CM | POA: Diagnosis not present

## 2013-10-07 DIAGNOSIS — E119 Type 2 diabetes mellitus without complications: Secondary | ICD-10-CM | POA: Diagnosis not present

## 2013-10-08 ENCOUNTER — Ambulatory Visit: Payer: Self-pay | Admitting: Cardiothoracic Surgery

## 2013-10-08 DIAGNOSIS — Z90710 Acquired absence of both cervix and uterus: Secondary | ICD-10-CM | POA: Diagnosis not present

## 2013-10-08 DIAGNOSIS — Z87891 Personal history of nicotine dependence: Secondary | ICD-10-CM | POA: Diagnosis not present

## 2013-10-08 DIAGNOSIS — R011 Cardiac murmur, unspecified: Secondary | ICD-10-CM | POA: Diagnosis not present

## 2013-10-08 DIAGNOSIS — R978 Other abnormal tumor markers: Secondary | ICD-10-CM | POA: Diagnosis not present

## 2013-10-08 DIAGNOSIS — Z79899 Other long term (current) drug therapy: Secondary | ICD-10-CM | POA: Diagnosis not present

## 2013-10-08 DIAGNOSIS — R599 Enlarged lymph nodes, unspecified: Secondary | ICD-10-CM | POA: Diagnosis not present

## 2013-10-08 DIAGNOSIS — R5383 Other fatigue: Secondary | ICD-10-CM | POA: Diagnosis not present

## 2013-10-08 DIAGNOSIS — C8589 Other specified types of non-Hodgkin lymphoma, extranodal and solid organ sites: Secondary | ICD-10-CM | POA: Diagnosis not present

## 2013-10-08 DIAGNOSIS — R630 Anorexia: Secondary | ICD-10-CM | POA: Diagnosis not present

## 2013-10-08 DIAGNOSIS — Z9089 Acquired absence of other organs: Secondary | ICD-10-CM | POA: Diagnosis not present

## 2013-10-08 DIAGNOSIS — F341 Dysthymic disorder: Secondary | ICD-10-CM | POA: Diagnosis not present

## 2013-10-08 DIAGNOSIS — R110 Nausea: Secondary | ICD-10-CM | POA: Diagnosis not present

## 2013-10-08 DIAGNOSIS — Z8051 Family history of malignant neoplasm of kidney: Secondary | ICD-10-CM | POA: Diagnosis not present

## 2013-10-08 DIAGNOSIS — E119 Type 2 diabetes mellitus without complications: Secondary | ICD-10-CM | POA: Diagnosis not present

## 2013-10-08 DIAGNOSIS — K5900 Constipation, unspecified: Secondary | ICD-10-CM | POA: Diagnosis not present

## 2013-10-08 DIAGNOSIS — R0602 Shortness of breath: Secondary | ICD-10-CM | POA: Diagnosis not present

## 2013-10-08 DIAGNOSIS — I498 Other specified cardiac arrhythmias: Secondary | ICD-10-CM | POA: Diagnosis not present

## 2013-10-08 DIAGNOSIS — I10 Essential (primary) hypertension: Secondary | ICD-10-CM | POA: Diagnosis not present

## 2013-10-08 DIAGNOSIS — K219 Gastro-esophageal reflux disease without esophagitis: Secondary | ICD-10-CM | POA: Diagnosis not present

## 2013-10-08 DIAGNOSIS — M549 Dorsalgia, unspecified: Secondary | ICD-10-CM | POA: Diagnosis not present

## 2013-10-08 DIAGNOSIS — Z800 Family history of malignant neoplasm of digestive organs: Secondary | ICD-10-CM | POA: Diagnosis not present

## 2013-10-08 DIAGNOSIS — J90 Pleural effusion, not elsewhere classified: Secondary | ICD-10-CM | POA: Diagnosis not present

## 2013-10-08 DIAGNOSIS — R5381 Other malaise: Secondary | ICD-10-CM | POA: Diagnosis not present

## 2013-10-08 DIAGNOSIS — Z5111 Encounter for antineoplastic chemotherapy: Secondary | ICD-10-CM | POA: Diagnosis not present

## 2013-10-10 DIAGNOSIS — J90 Pleural effusion, not elsewhere classified: Secondary | ICD-10-CM | POA: Diagnosis not present

## 2013-10-10 DIAGNOSIS — J9819 Other pulmonary collapse: Secondary | ICD-10-CM | POA: Diagnosis not present

## 2013-10-10 DIAGNOSIS — C8589 Other specified types of non-Hodgkin lymphoma, extranodal and solid organ sites: Secondary | ICD-10-CM | POA: Diagnosis not present

## 2013-10-11 ENCOUNTER — Ambulatory Visit: Payer: Self-pay | Admitting: Oncology

## 2013-10-11 DIAGNOSIS — R0602 Shortness of breath: Secondary | ICD-10-CM | POA: Diagnosis not present

## 2013-10-11 DIAGNOSIS — Z87898 Personal history of other specified conditions: Secondary | ICD-10-CM | POA: Diagnosis not present

## 2013-10-11 DIAGNOSIS — M549 Dorsalgia, unspecified: Secondary | ICD-10-CM | POA: Diagnosis not present

## 2013-10-11 DIAGNOSIS — Z87891 Personal history of nicotine dependence: Secondary | ICD-10-CM | POA: Diagnosis not present

## 2013-10-11 DIAGNOSIS — Z885 Allergy status to narcotic agent status: Secondary | ICD-10-CM | POA: Diagnosis not present

## 2013-10-11 DIAGNOSIS — I10 Essential (primary) hypertension: Secondary | ICD-10-CM | POA: Diagnosis not present

## 2013-10-11 DIAGNOSIS — Z794 Long term (current) use of insulin: Secondary | ICD-10-CM | POA: Diagnosis not present

## 2013-10-11 DIAGNOSIS — I498 Other specified cardiac arrhythmias: Secondary | ICD-10-CM | POA: Diagnosis not present

## 2013-10-11 DIAGNOSIS — K219 Gastro-esophageal reflux disease without esophagitis: Secondary | ICD-10-CM | POA: Diagnosis not present

## 2013-10-11 DIAGNOSIS — R011 Cardiac murmur, unspecified: Secondary | ICD-10-CM | POA: Diagnosis not present

## 2013-10-11 DIAGNOSIS — J90 Pleural effusion, not elsewhere classified: Secondary | ICD-10-CM | POA: Diagnosis not present

## 2013-10-11 DIAGNOSIS — E109 Type 1 diabetes mellitus without complications: Secondary | ICD-10-CM | POA: Diagnosis not present

## 2013-10-11 DIAGNOSIS — Z880 Allergy status to penicillin: Secondary | ICD-10-CM | POA: Diagnosis not present

## 2013-10-11 DIAGNOSIS — Z888 Allergy status to other drugs, medicaments and biological substances status: Secondary | ICD-10-CM | POA: Diagnosis not present

## 2013-10-11 DIAGNOSIS — R059 Cough, unspecified: Secondary | ICD-10-CM | POA: Diagnosis not present

## 2013-10-11 DIAGNOSIS — Z79899 Other long term (current) drug therapy: Secondary | ICD-10-CM | POA: Diagnosis not present

## 2013-10-11 LAB — BODY FLUID CELL COUNT WITH DIFFERENTIAL
BASOS ABS: 0 %
EOS PCT: 0 %
Lymphocytes: 84 %
Neutrophils: 6 %
Nucleated Cell Count: 961 /mm3
OTHER CELLS BF: 0 %
Other Mononuclear Cells: 10 %

## 2013-10-16 ENCOUNTER — Other Ambulatory Visit: Payer: Self-pay | Admitting: *Deleted

## 2013-10-16 LAB — CBC CANCER CENTER
BASOS ABS: 0.1 x10 3/mm (ref 0.0–0.1)
Basophil %: 1.4 %
EOS PCT: 3.7 %
Eosinophil #: 0.2 x10 3/mm (ref 0.0–0.7)
HCT: 41.9 % (ref 35.0–47.0)
HGB: 13.6 g/dL (ref 12.0–16.0)
LYMPHS ABS: 1.6 x10 3/mm (ref 1.0–3.6)
Lymphocyte %: 24 %
MCH: 28 pg (ref 26.0–34.0)
MCHC: 32.5 g/dL (ref 32.0–36.0)
MCV: 86 fL (ref 80–100)
Monocyte #: 0.9 x10 3/mm (ref 0.2–0.9)
Monocyte %: 13.1 %
Neutrophil #: 3.8 x10 3/mm (ref 1.4–6.5)
Neutrophil %: 57.8 %
Platelet: 268 x10 3/mm (ref 150–440)
RBC: 4.86 10*6/uL (ref 3.80–5.20)
RDW: 13.5 % (ref 11.5–14.5)
WBC: 6.6 x10 3/mm (ref 3.6–11.0)

## 2013-10-16 LAB — COMPREHENSIVE METABOLIC PANEL
ALK PHOS: 90 U/L
ALT: 23 U/L
Albumin: 2.9 g/dL — ABNORMAL LOW (ref 3.4–5.0)
Anion Gap: 9 (ref 7–16)
BUN: 14 mg/dL (ref 7–18)
Bilirubin,Total: 0.4 mg/dL (ref 0.2–1.0)
CALCIUM: 8.7 mg/dL (ref 8.5–10.1)
CHLORIDE: 95 mmol/L — AB (ref 98–107)
CO2: 28 mmol/L (ref 21–32)
Creatinine: 1 mg/dL (ref 0.60–1.30)
EGFR (Non-African Amer.): 49 — ABNORMAL LOW
GFR CALC AF AMER: 57 — AB
GLUCOSE: 147 mg/dL — AB (ref 65–99)
Osmolality: 268 (ref 275–301)
Potassium: 4 mmol/L (ref 3.5–5.1)
SGOT(AST): 20 U/L (ref 15–37)
SODIUM: 132 mmol/L — AB (ref 136–145)
TOTAL PROTEIN: 6.3 g/dL — AB (ref 6.4–8.2)

## 2013-10-16 LAB — LACTATE DEHYDROGENASE: LDH: 161 U/L (ref 81–246)

## 2013-10-16 MED ORDER — METOPROLOL SUCCINATE ER 25 MG PO TB24: 25.0000 mg | ORAL_TABLET | Freq: Two times a day (BID) | ORAL | Status: DC

## 2013-10-16 NOTE — Telephone Encounter (Signed)
Requested Prescriptions   Signed Prescriptions Disp Refills  . metoprolol succinate (TOPROL-XL) 25 MG 24 hr tablet 60 tablet 3    Sig: Take 1 tablet (25 mg total) by mouth 2 (two) times daily.    Authorizing Provider: Minna Merritts    Ordering User: Britt Bottom

## 2013-10-17 ENCOUNTER — Other Ambulatory Visit: Payer: Self-pay | Admitting: *Deleted

## 2013-10-17 LAB — CA 125: CA 125: 302.4 U/mL — AB (ref 0.0–34.0)

## 2013-10-17 MED ORDER — METOPROLOL SUCCINATE ER 25 MG PO TB24: 25.0000 mg | ORAL_TABLET | Freq: Two times a day (BID) | ORAL | Status: DC

## 2013-10-17 NOTE — Telephone Encounter (Signed)
Requested Prescriptions   Signed Prescriptions Disp Refills  . metoprolol succinate (TOPROL-XL) 25 MG 24 hr tablet 60 tablet 3    Sig: Take 1 tablet (25 mg total) by mouth 2 (two) times daily.    Authorizing Provider: Minna Merritts    Ordering User: Britt Bottom

## 2013-10-18 ENCOUNTER — Other Ambulatory Visit: Payer: Self-pay | Admitting: *Deleted

## 2013-10-18 MED ORDER — METOPROLOL SUCCINATE ER 25 MG PO TB24: 25.0000 mg | ORAL_TABLET | Freq: Two times a day (BID) | ORAL | Status: DC

## 2013-10-18 NOTE — Telephone Encounter (Signed)
Requested Prescriptions   Signed Prescriptions Disp Refills  . metoprolol succinate (TOPROL-XL) 25 MG 24 hr tablet 60 tablet 3    Sig: Take 1 tablet (25 mg total) by mouth 2 (two) times daily.    Authorizing Provider: Minna Merritts    Ordering User: Britt Bottom

## 2013-10-23 ENCOUNTER — Other Ambulatory Visit: Payer: Self-pay

## 2013-10-23 MED ORDER — METOPROLOL SUCCINATE ER 25 MG PO TB24: 25.0000 mg | ORAL_TABLET | Freq: Two times a day (BID) | ORAL | Status: DC

## 2013-10-24 ENCOUNTER — Ambulatory Visit: Payer: Self-pay | Admitting: Oncology

## 2013-10-25 ENCOUNTER — Other Ambulatory Visit: Payer: Self-pay

## 2013-10-25 MED ORDER — METOPROLOL SUCCINATE ER 25 MG PO TB24: 25.0000 mg | ORAL_TABLET | Freq: Two times a day (BID) | ORAL | Status: DC

## 2013-10-25 NOTE — Telephone Encounter (Signed)
Refill sent for metoprolol succ 25 mg  

## 2013-10-25 NOTE — Telephone Encounter (Signed)
Refill sent for metoprolol.  

## 2013-10-29 DIAGNOSIS — Z23 Encounter for immunization: Secondary | ICD-10-CM | POA: Diagnosis not present

## 2013-11-03 ENCOUNTER — Emergency Department: Payer: Self-pay | Admitting: Emergency Medicine

## 2013-11-03 DIAGNOSIS — J90 Pleural effusion, not elsewhere classified: Secondary | ICD-10-CM | POA: Diagnosis not present

## 2013-11-03 DIAGNOSIS — R05 Cough: Secondary | ICD-10-CM | POA: Diagnosis not present

## 2013-11-03 DIAGNOSIS — R531 Weakness: Secondary | ICD-10-CM | POA: Diagnosis not present

## 2013-11-03 DIAGNOSIS — Z880 Allergy status to penicillin: Secondary | ICD-10-CM | POA: Diagnosis not present

## 2013-11-03 DIAGNOSIS — E119 Type 2 diabetes mellitus without complications: Secondary | ICD-10-CM | POA: Diagnosis not present

## 2013-11-03 DIAGNOSIS — R079 Chest pain, unspecified: Secondary | ICD-10-CM | POA: Diagnosis not present

## 2013-11-03 DIAGNOSIS — I10 Essential (primary) hypertension: Secondary | ICD-10-CM | POA: Diagnosis not present

## 2013-11-03 DIAGNOSIS — C8590 Non-Hodgkin lymphoma, unspecified, unspecified site: Secondary | ICD-10-CM | POA: Diagnosis not present

## 2013-11-03 DIAGNOSIS — R0789 Other chest pain: Secondary | ICD-10-CM | POA: Diagnosis not present

## 2013-11-03 DIAGNOSIS — K2970 Gastritis, unspecified, without bleeding: Secondary | ICD-10-CM | POA: Diagnosis not present

## 2013-11-03 DIAGNOSIS — J439 Emphysema, unspecified: Secondary | ICD-10-CM | POA: Diagnosis not present

## 2013-11-03 DIAGNOSIS — J9811 Atelectasis: Secondary | ICD-10-CM | POA: Diagnosis not present

## 2013-11-03 LAB — CBC
HCT: 45.7 % (ref 35.0–47.0)
HGB: 14.5 g/dL (ref 12.0–16.0)
MCH: 27.4 pg (ref 26.0–34.0)
MCHC: 31.7 g/dL — ABNORMAL LOW (ref 32.0–36.0)
MCV: 87 fL (ref 80–100)
PLATELETS: 248 10*3/uL (ref 150–440)
RBC: 5.27 10*6/uL — AB (ref 3.80–5.20)
RDW: 14.1 % (ref 11.5–14.5)
WBC: 11.6 10*3/uL — ABNORMAL HIGH (ref 3.6–11.0)

## 2013-11-03 LAB — TROPONIN I
Troponin-I: <0.02 ng/mL
Troponin-I: <0.02 ng/mL

## 2013-11-03 LAB — COMPREHENSIVE METABOLIC PANEL
Albumin: 3.3 g/dL — ABNORMAL LOW (ref 3.4–5.0)
Alkaline Phosphatase: 92 U/L
Anion Gap: 11 (ref 7–16)
BILIRUBIN TOTAL: 0.4 mg/dL (ref 0.2–1.0)
BUN: 15 mg/dL (ref 7–18)
CO2: 24 mmol/L (ref 21–32)
Calcium, Total: 9.1 mg/dL (ref 8.5–10.1)
Chloride: 100 mmol/L (ref 98–107)
Creatinine: 0.88 mg/dL (ref 0.60–1.30)
Glucose: 151 mg/dL — ABNORMAL HIGH (ref 65–99)
Osmolality: 274 (ref 275–301)
Potassium: 3.7 mmol/L (ref 3.5–5.1)
SGOT(AST): 20 U/L (ref 15–37)
SGPT (ALT): 19 U/L
Sodium: 135 mmol/L — ABNORMAL LOW (ref 136–145)
TOTAL PROTEIN: 6.8 g/dL (ref 6.4–8.2)

## 2013-11-03 LAB — LIPASE, BLOOD: LIPASE: 190 U/L (ref 73–393)

## 2013-11-04 ENCOUNTER — Ambulatory Visit: Payer: Self-pay | Admitting: Cardiothoracic Surgery

## 2013-11-04 DIAGNOSIS — Z79899 Other long term (current) drug therapy: Secondary | ICD-10-CM | POA: Diagnosis not present

## 2013-11-04 DIAGNOSIS — J90 Pleural effusion, not elsewhere classified: Secondary | ICD-10-CM | POA: Diagnosis not present

## 2013-11-04 DIAGNOSIS — R7302 Impaired glucose tolerance (oral): Secondary | ICD-10-CM | POA: Diagnosis not present

## 2013-11-04 DIAGNOSIS — Z515 Encounter for palliative care: Secondary | ICD-10-CM | POA: Diagnosis not present

## 2013-11-04 DIAGNOSIS — R140 Abdominal distension (gaseous): Secondary | ICD-10-CM | POA: Diagnosis not present

## 2013-11-04 DIAGNOSIS — I10 Essential (primary) hypertension: Secondary | ICD-10-CM | POA: Diagnosis not present

## 2013-11-04 DIAGNOSIS — R5383 Other fatigue: Secondary | ICD-10-CM | POA: Diagnosis not present

## 2013-11-04 DIAGNOSIS — K219 Gastro-esophageal reflux disease without esophagitis: Secondary | ICD-10-CM | POA: Diagnosis not present

## 2013-11-04 DIAGNOSIS — R05 Cough: Secondary | ICD-10-CM | POA: Diagnosis not present

## 2013-11-04 DIAGNOSIS — M549 Dorsalgia, unspecified: Secondary | ICD-10-CM | POA: Diagnosis not present

## 2013-11-04 DIAGNOSIS — C8232 Follicular lymphoma grade IIIa, intrathoracic lymph nodes: Secondary | ICD-10-CM | POA: Diagnosis not present

## 2013-11-04 DIAGNOSIS — K5900 Constipation, unspecified: Secondary | ICD-10-CM | POA: Diagnosis not present

## 2013-11-04 DIAGNOSIS — M898X9 Other specified disorders of bone, unspecified site: Secondary | ICD-10-CM | POA: Diagnosis not present

## 2013-11-04 DIAGNOSIS — R531 Weakness: Secondary | ICD-10-CM | POA: Diagnosis not present

## 2013-11-04 DIAGNOSIS — R008 Other abnormalities of heart beat: Secondary | ICD-10-CM | POA: Diagnosis not present

## 2013-11-04 DIAGNOSIS — J9811 Atelectasis: Secondary | ICD-10-CM | POA: Diagnosis not present

## 2013-11-04 DIAGNOSIS — F419 Anxiety disorder, unspecified: Secondary | ICD-10-CM | POA: Diagnosis not present

## 2013-11-04 DIAGNOSIS — R1013 Epigastric pain: Secondary | ICD-10-CM | POA: Diagnosis not present

## 2013-11-04 DIAGNOSIS — I2510 Atherosclerotic heart disease of native coronary artery without angina pectoris: Secondary | ICD-10-CM | POA: Diagnosis not present

## 2013-11-04 DIAGNOSIS — F329 Major depressive disorder, single episode, unspecified: Secondary | ICD-10-CM | POA: Diagnosis not present

## 2013-11-04 DIAGNOSIS — D72829 Elevated white blood cell count, unspecified: Secondary | ICD-10-CM | POA: Diagnosis not present

## 2013-11-04 DIAGNOSIS — R971 Elevated cancer antigen 125 [CA 125]: Secondary | ICD-10-CM | POA: Diagnosis not present

## 2013-11-04 DIAGNOSIS — R011 Cardiac murmur, unspecified: Secondary | ICD-10-CM | POA: Diagnosis not present

## 2013-11-04 DIAGNOSIS — R110 Nausea: Secondary | ICD-10-CM | POA: Diagnosis not present

## 2013-11-04 DIAGNOSIS — R0602 Shortness of breath: Secondary | ICD-10-CM | POA: Diagnosis not present

## 2013-11-04 LAB — COMPREHENSIVE METABOLIC PANEL
ALK PHOS: 82 U/L
ALT: 19 U/L
ANION GAP: 11 (ref 7–16)
Albumin: 3.1 g/dL — ABNORMAL LOW (ref 3.4–5.0)
BUN: 18 mg/dL (ref 7–18)
Bilirubin,Total: 0.5 mg/dL (ref 0.2–1.0)
CALCIUM: 9.2 mg/dL (ref 8.5–10.1)
Chloride: 98 mmol/L (ref 98–107)
Co2: 25 mmol/L (ref 21–32)
Creatinine: 1.19 mg/dL (ref 0.60–1.30)
EGFR (African American): 55 — ABNORMAL LOW
EGFR (Non-African Amer.): 45 — ABNORMAL LOW
GLUCOSE: 207 mg/dL — AB (ref 65–99)
Osmolality: 276 (ref 275–301)
Potassium: 3.8 mmol/L (ref 3.5–5.1)
SGOT(AST): 24 U/L (ref 15–37)
Sodium: 134 mmol/L — ABNORMAL LOW (ref 136–145)
Total Protein: 6.3 g/dL — ABNORMAL LOW (ref 6.4–8.2)

## 2013-11-04 LAB — CBC CANCER CENTER
Basophil #: 0.1 x10 3/mm (ref 0.0–0.1)
Basophil %: 0.9 %
Eosinophil #: 0.2 x10 3/mm (ref 0.0–0.7)
Eosinophil %: 2 %
HCT: 42.8 % (ref 35.0–47.0)
HGB: 13.8 g/dL (ref 12.0–16.0)
LYMPHS PCT: 20.8 %
Lymphocyte #: 1.7 x10 3/mm (ref 1.0–3.6)
MCH: 28 pg (ref 26.0–34.0)
MCHC: 32.2 g/dL (ref 32.0–36.0)
MCV: 87 fL (ref 80–100)
MONO ABS: 0.9 x10 3/mm (ref 0.2–0.9)
Monocyte %: 10.7 %
NEUTROS PCT: 65.6 %
Neutrophil #: 5.2 x10 3/mm (ref 1.4–6.5)
Platelet: 247 x10 3/mm (ref 150–440)
RBC: 4.92 10*6/uL (ref 3.80–5.20)
RDW: 13.9 % (ref 11.5–14.5)
WBC: 8 x10 3/mm (ref 3.6–11.0)

## 2013-11-04 LAB — LACTATE DEHYDROGENASE: LDH: 141 U/L (ref 81–246)

## 2013-11-05 DIAGNOSIS — R971 Elevated cancer antigen 125 [CA 125]: Secondary | ICD-10-CM | POA: Diagnosis not present

## 2013-11-05 DIAGNOSIS — M898X9 Other specified disorders of bone, unspecified site: Secondary | ICD-10-CM | POA: Diagnosis not present

## 2013-11-05 DIAGNOSIS — J90 Pleural effusion, not elsewhere classified: Secondary | ICD-10-CM | POA: Diagnosis not present

## 2013-11-05 DIAGNOSIS — C8232 Follicular lymphoma grade IIIa, intrathoracic lymph nodes: Secondary | ICD-10-CM | POA: Diagnosis not present

## 2013-11-05 DIAGNOSIS — Z515 Encounter for palliative care: Secondary | ICD-10-CM | POA: Diagnosis not present

## 2013-11-05 DIAGNOSIS — I2510 Atherosclerotic heart disease of native coronary artery without angina pectoris: Secondary | ICD-10-CM | POA: Diagnosis not present

## 2013-11-07 ENCOUNTER — Other Ambulatory Visit: Payer: Self-pay

## 2013-11-07 DIAGNOSIS — C8232 Follicular lymphoma grade IIIa, intrathoracic lymph nodes: Secondary | ICD-10-CM | POA: Diagnosis not present

## 2013-11-07 LAB — CBC CANCER CENTER
BASOS ABS: 0.1 x10 3/mm (ref 0.0–0.1)
BASOS PCT: 0.5 %
Eosinophil #: 0.1 x10 3/mm (ref 0.0–0.7)
Eosinophil %: 0.2 %
HCT: 41.1 % (ref 35.0–47.0)
HGB: 13.2 g/dL (ref 12.0–16.0)
LYMPHS ABS: 0.6 x10 3/mm — AB (ref 1.0–3.6)
Lymphocyte %: 1.9 %
MCH: 27.9 pg (ref 26.0–34.0)
MCHC: 32.2 g/dL (ref 32.0–36.0)
MCV: 87 fL (ref 80–100)
MONO ABS: 1.6 x10 3/mm — AB (ref 0.2–0.9)
Monocyte %: 5.1 %
Neutrophil #: 28.3 x10 3/mm — ABNORMAL HIGH (ref 1.4–6.5)
Neutrophil %: 92.3 %
Platelet: 235 x10 3/mm (ref 150–440)
RBC: 4.74 10*6/uL (ref 3.80–5.20)
RDW: 14.6 % — ABNORMAL HIGH (ref 11.5–14.5)
WBC: 30.7 x10 3/mm — ABNORMAL HIGH (ref 3.6–11.0)

## 2013-11-07 LAB — COMPREHENSIVE METABOLIC PANEL
ALBUMIN: 3.1 g/dL — AB (ref 3.4–5.0)
AST: 24 U/L (ref 15–37)
Alkaline Phosphatase: 90 U/L
Anion Gap: 6 — ABNORMAL LOW (ref 7–16)
BUN: 26 mg/dL — ABNORMAL HIGH (ref 7–18)
Bilirubin,Total: 0.4 mg/dL (ref 0.2–1.0)
CALCIUM: 8.8 mg/dL (ref 8.5–10.1)
CHLORIDE: 99 mmol/L (ref 98–107)
CO2: 27 mmol/L (ref 21–32)
CREATININE: 1.17 mg/dL (ref 0.60–1.30)
EGFR (Non-African Amer.): 46 — ABNORMAL LOW
GFR CALC AF AMER: 56 — AB
GLUCOSE: 174 mg/dL — AB (ref 65–99)
Osmolality: 273 (ref 275–301)
Potassium: 3.8 mmol/L (ref 3.5–5.1)
SGPT (ALT): 39 U/L
Sodium: 132 mmol/L — ABNORMAL LOW (ref 136–145)
TOTAL PROTEIN: 6.4 g/dL (ref 6.4–8.2)

## 2013-11-07 MED ORDER — PANTOPRAZOLE SODIUM 40 MG PO TBEC: 40.0000 mg | DELAYED_RELEASE_TABLET | Freq: Two times a day (BID) | ORAL | Status: DC

## 2013-11-07 NOTE — Telephone Encounter (Signed)
Refill sent for pantoprazole 40 mg  

## 2013-11-08 ENCOUNTER — Other Ambulatory Visit: Payer: Self-pay

## 2013-11-08 MED ORDER — PANTOPRAZOLE SODIUM 40 MG PO TBEC: 40.0000 mg | DELAYED_RELEASE_TABLET | Freq: Two times a day (BID) | ORAL | Status: DC

## 2013-11-08 NOTE — Telephone Encounter (Signed)
Refill sent for pantoprazole.  

## 2013-11-09 ENCOUNTER — Inpatient Hospital Stay: Payer: Self-pay | Admitting: Specialist

## 2013-11-09 DIAGNOSIS — D72828 Other elevated white blood cell count: Secondary | ICD-10-CM | POA: Diagnosis present

## 2013-11-09 DIAGNOSIS — I517 Cardiomegaly: Secondary | ICD-10-CM | POA: Diagnosis not present

## 2013-11-09 DIAGNOSIS — T458X5A Adverse effect of other primarily systemic and hematological agents, initial encounter: Secondary | ICD-10-CM | POA: Diagnosis not present

## 2013-11-09 DIAGNOSIS — F419 Anxiety disorder, unspecified: Secondary | ICD-10-CM | POA: Diagnosis not present

## 2013-11-09 DIAGNOSIS — K219 Gastro-esophageal reflux disease without esophagitis: Secondary | ICD-10-CM | POA: Diagnosis present

## 2013-11-09 DIAGNOSIS — J910 Malignant pleural effusion: Secondary | ICD-10-CM | POA: Diagnosis not present

## 2013-11-09 DIAGNOSIS — K5909 Other constipation: Secondary | ICD-10-CM | POA: Diagnosis present

## 2013-11-09 DIAGNOSIS — Z8579 Personal history of other malignant neoplasms of lymphoid, hematopoietic and related tissues: Secondary | ICD-10-CM | POA: Diagnosis not present

## 2013-11-09 DIAGNOSIS — R0602 Shortness of breath: Secondary | ICD-10-CM | POA: Diagnosis not present

## 2013-11-09 DIAGNOSIS — M898X9 Other specified disorders of bone, unspecified site: Secondary | ICD-10-CM | POA: Diagnosis not present

## 2013-11-09 DIAGNOSIS — C8232 Follicular lymphoma grade IIIa, intrathoracic lymph nodes: Secondary | ICD-10-CM | POA: Diagnosis not present

## 2013-11-09 DIAGNOSIS — J90 Pleural effusion, not elsewhere classified: Secondary | ICD-10-CM | POA: Diagnosis not present

## 2013-11-09 DIAGNOSIS — D72829 Elevated white blood cell count, unspecified: Secondary | ICD-10-CM | POA: Diagnosis not present

## 2013-11-09 DIAGNOSIS — E119 Type 2 diabetes mellitus without complications: Secondary | ICD-10-CM | POA: Diagnosis not present

## 2013-11-09 DIAGNOSIS — C8290 Follicular lymphoma, unspecified, unspecified site: Secondary | ICD-10-CM | POA: Diagnosis not present

## 2013-11-09 DIAGNOSIS — Z515 Encounter for palliative care: Secondary | ICD-10-CM | POA: Diagnosis not present

## 2013-11-09 DIAGNOSIS — R0902 Hypoxemia: Secondary | ICD-10-CM | POA: Diagnosis not present

## 2013-11-09 DIAGNOSIS — I509 Heart failure, unspecified: Secondary | ICD-10-CM | POA: Diagnosis not present

## 2013-11-09 DIAGNOSIS — I10 Essential (primary) hypertension: Secondary | ICD-10-CM | POA: Diagnosis not present

## 2013-11-09 DIAGNOSIS — C8282 Other types of follicular lymphoma, intrathoracic lymph nodes: Secondary | ICD-10-CM | POA: Diagnosis not present

## 2013-11-09 LAB — COMPREHENSIVE METABOLIC PANEL
Albumin: 3.1 g/dL — ABNORMAL LOW (ref 3.4–5.0)
Alkaline Phosphatase: 174 U/L — ABNORMAL HIGH
Anion Gap: 10 (ref 7–16)
BUN: 15 mg/dL (ref 7–18)
Bilirubin,Total: 0.6 mg/dL (ref 0.2–1.0)
CALCIUM: 8.7 mg/dL (ref 8.5–10.1)
Chloride: 94 mmol/L — ABNORMAL LOW (ref 98–107)
Co2: 28 mmol/L (ref 21–32)
Creatinine: 0.8 mg/dL (ref 0.60–1.30)
EGFR (African American): >60
EGFR (Non-African Amer.): >60
GLUCOSE: 157 mg/dL — AB (ref 65–99)
Osmolality: 269 (ref 275–301)
POTASSIUM: 4.1 mmol/L (ref 3.5–5.1)
SGOT(AST): 42 U/L — ABNORMAL HIGH (ref 15–37)
SGPT (ALT): 31 U/L
Sodium: 132 mmol/L — ABNORMAL LOW (ref 136–145)
TOTAL PROTEIN: 6.9 g/dL (ref 6.4–8.2)

## 2013-11-09 LAB — CBC
HCT: 42.6 % (ref 35.0–47.0)
HGB: 13.7 g/dL (ref 12.0–16.0)
MCH: 27.9 pg (ref 26.0–34.0)
MCHC: 32.2 g/dL (ref 32.0–36.0)
MCV: 87 fL (ref 80–100)
PLATELETS: 240 10*3/uL (ref 150–440)
RBC: 4.91 10*6/uL (ref 3.80–5.20)
RDW: 14.3 % (ref 11.5–14.5)
WBC: 54.1 10*3/uL — ABNORMAL HIGH (ref 3.6–11.0)

## 2013-11-09 LAB — DIFFERENTIAL
Basophil #: 0.5 10*3/uL — ABNORMAL HIGH (ref 0.0–0.1)
Basophil %: 0.9 %
Eosinophil #: 0.2 10*3/uL (ref 0.0–0.7)
Eosinophil %: 0.4 %
Lymphocyte #: 0.4 10*3/uL — ABNORMAL LOW (ref 1.0–3.6)
Lymphocyte %: 0.7 %
Monocyte #: 2.2 x10 3/mm — ABNORMAL HIGH (ref 0.2–0.9)
Monocyte %: 4.2 %
Neutrophil #: 50.7 10*3/uL — ABNORMAL HIGH (ref 1.4–6.5)
Neutrophil %: 93.8 %

## 2013-11-09 LAB — TROPONIN I

## 2013-11-09 LAB — CK TOTAL AND CKMB (NOT AT ARMC)
CK, Total: 61 U/L
CK-MB: 1.2 ng/mL (ref 0.5–3.6)

## 2013-11-09 LAB — PROTIME-INR
INR: 1
Prothrombin Time: 13 secs (ref 11.5–14.7)

## 2013-11-09 LAB — PRO B NATRIURETIC PEPTIDE: B-TYPE NATIURETIC PEPTID: 9091 pg/mL — AB (ref 0–450)

## 2013-11-10 LAB — BASIC METABOLIC PANEL
Anion Gap: 9 (ref 7–16)
BUN: 11 mg/dL (ref 7–18)
CALCIUM: 8.4 mg/dL — AB (ref 8.5–10.1)
CREATININE: 0.78 mg/dL (ref 0.60–1.30)
Chloride: 96 mmol/L — ABNORMAL LOW (ref 98–107)
Co2: 30 mmol/L (ref 21–32)
EGFR (African American): >60
EGFR (Non-African Amer.): >60
Glucose: 101 mg/dL — ABNORMAL HIGH (ref 65–99)
Osmolality: 270 (ref 275–301)
Potassium: 3.6 mmol/L (ref 3.5–5.1)
SODIUM: 135 mmol/L — AB (ref 136–145)

## 2013-11-10 LAB — CBC WITH DIFFERENTIAL/PLATELET
BANDS NEUTROPHIL: 4 %
EOS PCT: 2 %
HCT: 36.9 % (ref 35.0–47.0)
HGB: 11.5 g/dL — AB (ref 12.0–16.0)
LYMPHS PCT: 2 %
MCH: 27.3 pg (ref 26.0–34.0)
MCHC: 31.3 g/dL — ABNORMAL LOW (ref 32.0–36.0)
MCV: 87 fL (ref 80–100)
Metamyelocyte: 1 %
Monocytes: 7 %
PLATELETS: 221 10*3/uL (ref 150–440)
RBC: 4.23 10*6/uL (ref 3.80–5.20)
RDW: 14.3 % (ref 11.5–14.5)
SEGMENTED NEUTROPHILS: 84 %
WBC: 48.7 10*3/uL — AB (ref 3.6–11.0)

## 2013-11-11 LAB — CBC WITH DIFFERENTIAL/PLATELET
Basophil #: 0.1 10*3/uL (ref 0.0–0.1)
Basophil %: 0.3 %
Eosinophil #: 0.4 10*3/uL (ref 0.0–0.7)
Eosinophil %: 0.8 %
HCT: 39.3 % (ref 35.0–47.0)
HGB: 12.8 g/dL (ref 12.0–16.0)
Lymphocyte #: 0.4 10*3/uL — ABNORMAL LOW (ref 1.0–3.6)
Lymphocyte %: 0.8 %
MCH: 28.2 pg (ref 26.0–34.0)
MCHC: 32.4 g/dL (ref 32.0–36.0)
MCV: 87 fL (ref 80–100)
Monocyte #: 3.2 x10 3/mm — ABNORMAL HIGH (ref 0.2–0.9)
Monocyte %: 6.8 %
Neutrophil #: 42.7 10*3/uL — ABNORMAL HIGH (ref 1.4–6.5)
Neutrophil %: 91.3 %
Platelet: 230 10*3/uL (ref 150–440)
RBC: 4.52 10*6/uL (ref 3.80–5.20)
RDW: 14.4 % (ref 11.5–14.5)
WBC: 46.7 10*3/uL — ABNORMAL HIGH (ref 3.6–11.0)

## 2013-11-11 LAB — BASIC METABOLIC PANEL
Anion Gap: 9 (ref 7–16)
BUN: 8 mg/dL (ref 7–18)
CHLORIDE: 91 mmol/L — AB (ref 98–107)
Calcium, Total: 8.7 mg/dL (ref 8.5–10.1)
Co2: 33 mmol/L — ABNORMAL HIGH (ref 21–32)
Creatinine: 0.96 mg/dL (ref 0.60–1.30)
EGFR (African American): >60
EGFR (Non-African Amer.): 58 — ABNORMAL LOW
GLUCOSE: 101 mg/dL — AB (ref 65–99)
Osmolality: 265 (ref 275–301)
Potassium: 3.8 mmol/L (ref 3.5–5.1)
Sodium: 133 mmol/L — ABNORMAL LOW (ref 136–145)

## 2013-11-11 LAB — PROTIME-INR
INR: 1
PROTHROMBIN TIME: 13.3 s (ref 11.5–14.7)

## 2013-11-13 DIAGNOSIS — J910 Malignant pleural effusion: Secondary | ICD-10-CM | POA: Diagnosis not present

## 2013-11-13 DIAGNOSIS — S90921D Unspecified superficial injury of right foot, subsequent encounter: Secondary | ICD-10-CM | POA: Diagnosis not present

## 2013-11-13 DIAGNOSIS — C8230 Follicular lymphoma grade IIIa, unspecified site: Secondary | ICD-10-CM | POA: Diagnosis not present

## 2013-11-13 DIAGNOSIS — F419 Anxiety disorder, unspecified: Secondary | ICD-10-CM | POA: Diagnosis not present

## 2013-11-13 DIAGNOSIS — K219 Gastro-esophageal reflux disease without esophagitis: Secondary | ICD-10-CM | POA: Diagnosis not present

## 2013-11-13 DIAGNOSIS — I10 Essential (primary) hypertension: Secondary | ICD-10-CM | POA: Diagnosis not present

## 2013-11-13 DIAGNOSIS — E119 Type 2 diabetes mellitus without complications: Secondary | ICD-10-CM | POA: Diagnosis not present

## 2013-11-15 DIAGNOSIS — Z23 Encounter for immunization: Secondary | ICD-10-CM | POA: Diagnosis not present

## 2013-11-15 DIAGNOSIS — M79673 Pain in unspecified foot: Secondary | ICD-10-CM | POA: Diagnosis not present

## 2013-11-16 DIAGNOSIS — C8230 Follicular lymphoma grade IIIa, unspecified site: Secondary | ICD-10-CM | POA: Diagnosis not present

## 2013-11-16 DIAGNOSIS — I10 Essential (primary) hypertension: Secondary | ICD-10-CM | POA: Diagnosis not present

## 2013-11-16 DIAGNOSIS — F419 Anxiety disorder, unspecified: Secondary | ICD-10-CM | POA: Diagnosis not present

## 2013-11-16 DIAGNOSIS — J910 Malignant pleural effusion: Secondary | ICD-10-CM | POA: Diagnosis not present

## 2013-11-16 DIAGNOSIS — E119 Type 2 diabetes mellitus without complications: Secondary | ICD-10-CM | POA: Diagnosis not present

## 2013-11-16 DIAGNOSIS — K219 Gastro-esophageal reflux disease without esophagitis: Secondary | ICD-10-CM | POA: Diagnosis not present

## 2013-11-18 DIAGNOSIS — Z515 Encounter for palliative care: Secondary | ICD-10-CM | POA: Diagnosis not present

## 2013-11-18 DIAGNOSIS — M898X9 Other specified disorders of bone, unspecified site: Secondary | ICD-10-CM | POA: Diagnosis not present

## 2013-11-18 DIAGNOSIS — R971 Elevated cancer antigen 125 [CA 125]: Secondary | ICD-10-CM | POA: Diagnosis not present

## 2013-11-18 DIAGNOSIS — J90 Pleural effusion, not elsewhere classified: Secondary | ICD-10-CM | POA: Diagnosis not present

## 2013-11-18 DIAGNOSIS — C8232 Follicular lymphoma grade IIIa, intrathoracic lymph nodes: Secondary | ICD-10-CM | POA: Diagnosis not present

## 2013-11-18 DIAGNOSIS — I2510 Atherosclerotic heart disease of native coronary artery without angina pectoris: Secondary | ICD-10-CM | POA: Diagnosis not present

## 2013-11-18 LAB — CBC CANCER CENTER
Basophil #: 0.1 x10 3/mm (ref 0.0–0.1)
Basophil %: 0.9 %
Eosinophil #: 0.2 x10 3/mm (ref 0.0–0.7)
Eosinophil %: 1.6 %
HCT: 40.6 % (ref 35.0–47.0)
HGB: 13.1 g/dL (ref 12.0–16.0)
LYMPHS ABS: 0.6 x10 3/mm — AB (ref 1.0–3.6)
LYMPHS PCT: 3.8 %
MCH: 28.1 pg (ref 26.0–34.0)
MCHC: 32.2 g/dL (ref 32.0–36.0)
MCV: 87 fL (ref 80–100)
MONOS PCT: 7.6 %
Monocyte #: 1.2 x10 3/mm — ABNORMAL HIGH (ref 0.2–0.9)
Neutrophil #: 13.4 x10 3/mm — ABNORMAL HIGH (ref 1.4–6.5)
Neutrophil %: 86.1 %
Platelet: 221 x10 3/mm (ref 150–440)
RBC: 4.65 10*6/uL (ref 3.80–5.20)
RDW: 14.4 % (ref 11.5–14.5)
WBC: 15.6 x10 3/mm — ABNORMAL HIGH (ref 3.6–11.0)

## 2013-11-18 LAB — HEMOGLOBIN A1C: Hemoglobin A1C: 7.7 % — ABNORMAL HIGH (ref 4.2–6.3)

## 2013-11-19 DIAGNOSIS — F419 Anxiety disorder, unspecified: Secondary | ICD-10-CM | POA: Diagnosis not present

## 2013-11-19 DIAGNOSIS — I10 Essential (primary) hypertension: Secondary | ICD-10-CM | POA: Diagnosis not present

## 2013-11-19 DIAGNOSIS — E119 Type 2 diabetes mellitus without complications: Secondary | ICD-10-CM | POA: Diagnosis not present

## 2013-11-19 DIAGNOSIS — J910 Malignant pleural effusion: Secondary | ICD-10-CM | POA: Diagnosis not present

## 2013-11-19 DIAGNOSIS — C8230 Follicular lymphoma grade IIIa, unspecified site: Secondary | ICD-10-CM | POA: Diagnosis not present

## 2013-11-19 DIAGNOSIS — K219 Gastro-esophageal reflux disease without esophagitis: Secondary | ICD-10-CM | POA: Diagnosis not present

## 2013-11-21 DIAGNOSIS — K219 Gastro-esophageal reflux disease without esophagitis: Secondary | ICD-10-CM | POA: Diagnosis not present

## 2013-11-21 DIAGNOSIS — F419 Anxiety disorder, unspecified: Secondary | ICD-10-CM | POA: Diagnosis not present

## 2013-11-21 DIAGNOSIS — I10 Essential (primary) hypertension: Secondary | ICD-10-CM | POA: Diagnosis not present

## 2013-11-21 DIAGNOSIS — E119 Type 2 diabetes mellitus without complications: Secondary | ICD-10-CM | POA: Diagnosis not present

## 2013-11-21 DIAGNOSIS — J910 Malignant pleural effusion: Secondary | ICD-10-CM | POA: Diagnosis not present

## 2013-11-21 DIAGNOSIS — C8230 Follicular lymphoma grade IIIa, unspecified site: Secondary | ICD-10-CM | POA: Diagnosis not present

## 2013-11-22 DIAGNOSIS — K219 Gastro-esophageal reflux disease without esophagitis: Secondary | ICD-10-CM | POA: Diagnosis not present

## 2013-11-22 DIAGNOSIS — C8230 Follicular lymphoma grade IIIa, unspecified site: Secondary | ICD-10-CM | POA: Diagnosis not present

## 2013-11-22 DIAGNOSIS — F419 Anxiety disorder, unspecified: Secondary | ICD-10-CM | POA: Diagnosis not present

## 2013-11-22 DIAGNOSIS — E119 Type 2 diabetes mellitus without complications: Secondary | ICD-10-CM | POA: Diagnosis not present

## 2013-11-22 DIAGNOSIS — J910 Malignant pleural effusion: Secondary | ICD-10-CM | POA: Diagnosis not present

## 2013-11-22 DIAGNOSIS — I10 Essential (primary) hypertension: Secondary | ICD-10-CM | POA: Diagnosis not present

## 2013-11-24 ENCOUNTER — Ambulatory Visit: Payer: Self-pay | Admitting: Cardiothoracic Surgery

## 2013-11-24 ENCOUNTER — Ambulatory Visit: Payer: Self-pay | Admitting: Oncology

## 2013-11-24 DIAGNOSIS — R011 Cardiac murmur, unspecified: Secondary | ICD-10-CM | POA: Diagnosis not present

## 2013-11-24 DIAGNOSIS — Z9049 Acquired absence of other specified parts of digestive tract: Secondary | ICD-10-CM | POA: Diagnosis not present

## 2013-11-24 DIAGNOSIS — Z5111 Encounter for antineoplastic chemotherapy: Secondary | ICD-10-CM | POA: Diagnosis not present

## 2013-11-24 DIAGNOSIS — H269 Unspecified cataract: Secondary | ICD-10-CM | POA: Diagnosis not present

## 2013-11-24 DIAGNOSIS — I10 Essential (primary) hypertension: Secondary | ICD-10-CM | POA: Diagnosis not present

## 2013-11-24 DIAGNOSIS — M7989 Other specified soft tissue disorders: Secondary | ICD-10-CM | POA: Diagnosis not present

## 2013-11-24 DIAGNOSIS — C8590 Non-Hodgkin lymphoma, unspecified, unspecified site: Secondary | ICD-10-CM | POA: Diagnosis not present

## 2013-11-24 DIAGNOSIS — K219 Gastro-esophageal reflux disease without esophagitis: Secondary | ICD-10-CM | POA: Diagnosis not present

## 2013-11-24 DIAGNOSIS — R530 Neoplastic (malignant) related fatigue: Secondary | ICD-10-CM | POA: Diagnosis not present

## 2013-11-24 DIAGNOSIS — Z418 Encounter for other procedures for purposes other than remedying health state: Secondary | ICD-10-CM | POA: Diagnosis not present

## 2013-11-24 DIAGNOSIS — R52 Pain, unspecified: Secondary | ICD-10-CM | POA: Diagnosis not present

## 2013-11-24 DIAGNOSIS — K5900 Constipation, unspecified: Secondary | ICD-10-CM | POA: Diagnosis not present

## 2013-11-24 DIAGNOSIS — D709 Neutropenia, unspecified: Secondary | ICD-10-CM | POA: Diagnosis not present

## 2013-11-24 DIAGNOSIS — R7302 Impaired glucose tolerance (oral): Secondary | ICD-10-CM | POA: Diagnosis not present

## 2013-11-24 DIAGNOSIS — R739 Hyperglycemia, unspecified: Secondary | ICD-10-CM | POA: Diagnosis not present

## 2013-11-24 DIAGNOSIS — J90 Pleural effusion, not elsewhere classified: Secondary | ICD-10-CM | POA: Diagnosis not present

## 2013-11-24 DIAGNOSIS — I499 Cardiac arrhythmia, unspecified: Secondary | ICD-10-CM | POA: Diagnosis not present

## 2013-11-24 DIAGNOSIS — R0789 Other chest pain: Secondary | ICD-10-CM | POA: Diagnosis not present

## 2013-11-24 DIAGNOSIS — T451X5S Adverse effect of antineoplastic and immunosuppressive drugs, sequela: Secondary | ICD-10-CM | POA: Diagnosis not present

## 2013-11-24 DIAGNOSIS — R531 Weakness: Secondary | ICD-10-CM | POA: Diagnosis not present

## 2013-11-24 DIAGNOSIS — R971 Elevated cancer antigen 125 [CA 125]: Secondary | ICD-10-CM | POA: Diagnosis not present

## 2013-11-24 DIAGNOSIS — M549 Dorsalgia, unspecified: Secondary | ICD-10-CM | POA: Diagnosis not present

## 2013-11-24 DIAGNOSIS — F329 Major depressive disorder, single episode, unspecified: Secondary | ICD-10-CM | POA: Diagnosis not present

## 2013-11-24 DIAGNOSIS — E119 Type 2 diabetes mellitus without complications: Secondary | ICD-10-CM | POA: Diagnosis not present

## 2013-11-24 DIAGNOSIS — F419 Anxiety disorder, unspecified: Secondary | ICD-10-CM | POA: Diagnosis not present

## 2013-11-25 DIAGNOSIS — C8230 Follicular lymphoma grade IIIa, unspecified site: Secondary | ICD-10-CM | POA: Diagnosis not present

## 2013-11-25 DIAGNOSIS — E119 Type 2 diabetes mellitus without complications: Secondary | ICD-10-CM | POA: Diagnosis not present

## 2013-11-25 DIAGNOSIS — J910 Malignant pleural effusion: Secondary | ICD-10-CM | POA: Diagnosis not present

## 2013-11-25 DIAGNOSIS — I10 Essential (primary) hypertension: Secondary | ICD-10-CM | POA: Diagnosis not present

## 2013-11-25 DIAGNOSIS — F419 Anxiety disorder, unspecified: Secondary | ICD-10-CM | POA: Diagnosis not present

## 2013-11-25 DIAGNOSIS — K219 Gastro-esophageal reflux disease without esophagitis: Secondary | ICD-10-CM | POA: Diagnosis not present

## 2013-11-25 LAB — CBC CANCER CENTER
BASOS ABS: 0.1 x10 3/mm (ref 0.0–0.1)
Basophil %: 1.2 %
Eosinophil #: 0.3 x10 3/mm (ref 0.0–0.7)
Eosinophil %: 3.1 %
HCT: 40.1 % (ref 35.0–47.0)
HGB: 13.2 g/dL (ref 12.0–16.0)
LYMPHS PCT: 7.8 %
Lymphocyte #: 0.7 x10 3/mm — ABNORMAL LOW (ref 1.0–3.6)
MCH: 28.9 pg (ref 26.0–34.0)
MCHC: 32.8 g/dL (ref 32.0–36.0)
MCV: 88 fL (ref 80–100)
MONO ABS: 1.3 x10 3/mm — AB (ref 0.2–0.9)
Monocyte %: 14.3 %
NEUTROS ABS: 6.8 x10 3/mm — AB (ref 1.4–6.5)
Neutrophil %: 73.6 %
Platelet: 236 x10 3/mm (ref 150–440)
RBC: 4.55 10*6/uL (ref 3.80–5.20)
RDW: 15.2 % — AB (ref 11.5–14.5)
WBC: 9.2 x10 3/mm (ref 3.6–11.0)

## 2013-11-26 DIAGNOSIS — F419 Anxiety disorder, unspecified: Secondary | ICD-10-CM | POA: Diagnosis not present

## 2013-11-26 DIAGNOSIS — I10 Essential (primary) hypertension: Secondary | ICD-10-CM | POA: Diagnosis not present

## 2013-11-26 DIAGNOSIS — C8230 Follicular lymphoma grade IIIa, unspecified site: Secondary | ICD-10-CM | POA: Diagnosis not present

## 2013-11-26 DIAGNOSIS — E119 Type 2 diabetes mellitus without complications: Secondary | ICD-10-CM | POA: Diagnosis not present

## 2013-11-26 DIAGNOSIS — K219 Gastro-esophageal reflux disease without esophagitis: Secondary | ICD-10-CM | POA: Diagnosis not present

## 2013-11-26 DIAGNOSIS — J910 Malignant pleural effusion: Secondary | ICD-10-CM | POA: Diagnosis not present

## 2013-11-27 DIAGNOSIS — K219 Gastro-esophageal reflux disease without esophagitis: Secondary | ICD-10-CM | POA: Diagnosis not present

## 2013-11-27 DIAGNOSIS — J910 Malignant pleural effusion: Secondary | ICD-10-CM | POA: Diagnosis not present

## 2013-11-27 DIAGNOSIS — F419 Anxiety disorder, unspecified: Secondary | ICD-10-CM | POA: Diagnosis not present

## 2013-11-27 DIAGNOSIS — E119 Type 2 diabetes mellitus without complications: Secondary | ICD-10-CM | POA: Diagnosis not present

## 2013-11-27 DIAGNOSIS — C8230 Follicular lymphoma grade IIIa, unspecified site: Secondary | ICD-10-CM | POA: Diagnosis not present

## 2013-11-27 DIAGNOSIS — I10 Essential (primary) hypertension: Secondary | ICD-10-CM | POA: Diagnosis not present

## 2013-11-28 DIAGNOSIS — I10 Essential (primary) hypertension: Secondary | ICD-10-CM | POA: Diagnosis not present

## 2013-11-28 DIAGNOSIS — K219 Gastro-esophageal reflux disease without esophagitis: Secondary | ICD-10-CM | POA: Diagnosis not present

## 2013-11-28 DIAGNOSIS — E119 Type 2 diabetes mellitus without complications: Secondary | ICD-10-CM | POA: Diagnosis not present

## 2013-11-28 DIAGNOSIS — J910 Malignant pleural effusion: Secondary | ICD-10-CM | POA: Diagnosis not present

## 2013-11-28 DIAGNOSIS — C8230 Follicular lymphoma grade IIIa, unspecified site: Secondary | ICD-10-CM | POA: Diagnosis not present

## 2013-11-28 DIAGNOSIS — C8590 Non-Hodgkin lymphoma, unspecified, unspecified site: Secondary | ICD-10-CM | POA: Diagnosis not present

## 2013-11-28 DIAGNOSIS — F419 Anxiety disorder, unspecified: Secondary | ICD-10-CM | POA: Diagnosis not present

## 2013-12-02 ENCOUNTER — Ambulatory Visit: Payer: Medicare Other | Admitting: Cardiovascular Disease

## 2013-12-02 DIAGNOSIS — D709 Neutropenia, unspecified: Secondary | ICD-10-CM | POA: Diagnosis not present

## 2013-12-02 DIAGNOSIS — Z418 Encounter for other procedures for purposes other than remedying health state: Secondary | ICD-10-CM | POA: Diagnosis not present

## 2013-12-02 DIAGNOSIS — C8590 Non-Hodgkin lymphoma, unspecified, unspecified site: Secondary | ICD-10-CM | POA: Diagnosis not present

## 2013-12-02 DIAGNOSIS — T451X5S Adverse effect of antineoplastic and immunosuppressive drugs, sequela: Secondary | ICD-10-CM | POA: Diagnosis not present

## 2013-12-02 LAB — CBC CANCER CENTER
Basophil #: 0.2 x10 3/mm — ABNORMAL HIGH (ref 0.0–0.1)
Basophil %: 2.9 %
EOS ABS: 0.4 x10 3/mm (ref 0.0–0.7)
Eosinophil %: 6.2 %
HCT: 40.6 % (ref 35.0–47.0)
HGB: 13.3 g/dL (ref 12.0–16.0)
LYMPHS ABS: 1.1 x10 3/mm (ref 1.0–3.6)
Lymphocyte %: 18.8 %
MCH: 28.5 pg (ref 26.0–34.0)
MCHC: 32.6 g/dL (ref 32.0–36.0)
MCV: 87 fL (ref 80–100)
Monocyte #: 0.9 x10 3/mm (ref 0.2–0.9)
Monocyte %: 16 %
NEUTROS PCT: 56.1 %
Neutrophil #: 3.2 x10 3/mm (ref 1.4–6.5)
PLATELETS: 147 x10 3/mm — AB (ref 150–440)
RBC: 4.65 10*6/uL (ref 3.80–5.20)
RDW: 15.5 % — ABNORMAL HIGH (ref 11.5–14.5)
WBC: 5.7 x10 3/mm (ref 3.6–11.0)

## 2013-12-02 LAB — COMPREHENSIVE METABOLIC PANEL
ALK PHOS: 149 U/L — AB
ANION GAP: 10 (ref 7–16)
AST: 30 U/L (ref 15–37)
Albumin: 2.8 g/dL — ABNORMAL LOW (ref 3.4–5.0)
BILIRUBIN TOTAL: 0.4 mg/dL (ref 0.2–1.0)
BUN: 14 mg/dL (ref 7–18)
CO2: 25 mmol/L (ref 21–32)
CREATININE: 0.95 mg/dL (ref 0.60–1.30)
Calcium, Total: 8.3 mg/dL — ABNORMAL LOW (ref 8.5–10.1)
Chloride: 99 mmol/L (ref 98–107)
EGFR (African American): >60
EGFR (Non-African Amer.): 58 — ABNORMAL LOW
GLUCOSE: 208 mg/dL — AB (ref 65–99)
Osmolality: 275 (ref 275–301)
Potassium: 4.2 mmol/L (ref 3.5–5.1)
SGPT (ALT): 31 U/L
SODIUM: 134 mmol/L — AB (ref 136–145)
Total Protein: 5.9 g/dL — ABNORMAL LOW (ref 6.4–8.2)

## 2013-12-03 DIAGNOSIS — J910 Malignant pleural effusion: Secondary | ICD-10-CM | POA: Diagnosis not present

## 2013-12-03 DIAGNOSIS — F419 Anxiety disorder, unspecified: Secondary | ICD-10-CM | POA: Diagnosis not present

## 2013-12-03 DIAGNOSIS — E119 Type 2 diabetes mellitus without complications: Secondary | ICD-10-CM | POA: Diagnosis not present

## 2013-12-03 DIAGNOSIS — C8230 Follicular lymphoma grade IIIa, unspecified site: Secondary | ICD-10-CM | POA: Diagnosis not present

## 2013-12-03 DIAGNOSIS — K219 Gastro-esophageal reflux disease without esophagitis: Secondary | ICD-10-CM | POA: Diagnosis not present

## 2013-12-03 DIAGNOSIS — I10 Essential (primary) hypertension: Secondary | ICD-10-CM | POA: Diagnosis not present

## 2013-12-04 ENCOUNTER — Other Ambulatory Visit: Payer: Self-pay | Admitting: Cardiovascular Disease

## 2013-12-09 LAB — CBC CANCER CENTER
BASOS PCT: 1.5 %
Basophil #: 0.3 x10 3/mm — ABNORMAL HIGH (ref 0.0–0.1)
EOS ABS: 0.4 x10 3/mm (ref 0.0–0.7)
Eosinophil %: 2 %
HCT: 40 % (ref 35.0–47.0)
HGB: 13 g/dL (ref 12.0–16.0)
LYMPHS PCT: 20.4 %
Lymphocyte #: 3.9 x10 3/mm — ABNORMAL HIGH (ref 1.0–3.6)
MCH: 28 pg (ref 26.0–34.0)
MCHC: 32.4 g/dL (ref 32.0–36.0)
MCV: 87 fL (ref 80–100)
MONOS PCT: 10.5 %
Monocyte #: 2 x10 3/mm — ABNORMAL HIGH (ref 0.2–0.9)
NEUTROS PCT: 65.6 %
Neutrophil #: 12.6 x10 3/mm — ABNORMAL HIGH (ref 1.4–6.5)
PLATELETS: 140 x10 3/mm — AB (ref 150–440)
RBC: 4.62 10*6/uL (ref 3.80–5.20)
RDW: 15.6 % — ABNORMAL HIGH (ref 11.5–14.5)
WBC: 19.2 x10 3/mm — ABNORMAL HIGH (ref 3.6–11.0)

## 2013-12-10 DIAGNOSIS — F419 Anxiety disorder, unspecified: Secondary | ICD-10-CM | POA: Diagnosis not present

## 2013-12-10 DIAGNOSIS — K219 Gastro-esophageal reflux disease without esophagitis: Secondary | ICD-10-CM | POA: Diagnosis not present

## 2013-12-10 DIAGNOSIS — I10 Essential (primary) hypertension: Secondary | ICD-10-CM | POA: Diagnosis not present

## 2013-12-10 DIAGNOSIS — C8230 Follicular lymphoma grade IIIa, unspecified site: Secondary | ICD-10-CM | POA: Diagnosis not present

## 2013-12-10 DIAGNOSIS — J910 Malignant pleural effusion: Secondary | ICD-10-CM | POA: Diagnosis not present

## 2013-12-10 DIAGNOSIS — E119 Type 2 diabetes mellitus without complications: Secondary | ICD-10-CM | POA: Diagnosis not present

## 2013-12-11 DIAGNOSIS — C8590 Non-Hodgkin lymphoma, unspecified, unspecified site: Secondary | ICD-10-CM | POA: Diagnosis not present

## 2013-12-11 DIAGNOSIS — J90 Pleural effusion, not elsewhere classified: Secondary | ICD-10-CM | POA: Diagnosis not present

## 2013-12-12 ENCOUNTER — Encounter: Payer: Self-pay | Admitting: Cardiovascular Disease

## 2013-12-12 ENCOUNTER — Ambulatory Visit (INDEPENDENT_AMBULATORY_CARE_PROVIDER_SITE_OTHER): Payer: Medicare Other | Admitting: Cardiovascular Disease

## 2013-12-12 VITALS — BP 120/40 | HR 74 | Ht 62.0 in | Wt 149.5 lb

## 2013-12-12 DIAGNOSIS — C8590 Non-Hodgkin lymphoma, unspecified, unspecified site: Secondary | ICD-10-CM

## 2013-12-12 DIAGNOSIS — R0602 Shortness of breath: Secondary | ICD-10-CM

## 2013-12-12 DIAGNOSIS — C8230 Follicular lymphoma grade IIIa, unspecified site: Secondary | ICD-10-CM | POA: Diagnosis not present

## 2013-12-12 DIAGNOSIS — I491 Atrial premature depolarization: Secondary | ICD-10-CM

## 2013-12-12 DIAGNOSIS — F419 Anxiety disorder, unspecified: Secondary | ICD-10-CM | POA: Diagnosis not present

## 2013-12-12 DIAGNOSIS — I10 Essential (primary) hypertension: Secondary | ICD-10-CM | POA: Diagnosis not present

## 2013-12-12 DIAGNOSIS — K219 Gastro-esophageal reflux disease without esophagitis: Secondary | ICD-10-CM | POA: Diagnosis not present

## 2013-12-12 DIAGNOSIS — E119 Type 2 diabetes mellitus without complications: Secondary | ICD-10-CM | POA: Diagnosis not present

## 2013-12-12 DIAGNOSIS — J910 Malignant pleural effusion: Secondary | ICD-10-CM | POA: Diagnosis not present

## 2013-12-12 MED ORDER — LOSARTAN POTASSIUM-HCTZ 100-12.5 MG PO TABS: 1.0000 | ORAL_TABLET | Freq: Every day | ORAL | Status: DC

## 2013-12-12 MED ORDER — PANTOPRAZOLE SODIUM 40 MG PO TBEC: 40.0000 mg | DELAYED_RELEASE_TABLET | Freq: Every day | ORAL | Status: DC

## 2013-12-12 MED ORDER — METOPROLOL SUCCINATE ER 25 MG PO TB24: 25.0000 mg | ORAL_TABLET | Freq: Every day | ORAL | Status: DC

## 2013-12-12 NOTE — Assessment & Plan Note (Signed)
Blood pressure is well controlled on today's visit. No changes made to the medications. 

## 2013-12-12 NOTE — Assessment & Plan Note (Signed)
Shortness of breath has dramatically improved with her Pleurx catheter. Minimal effusion appreciated on today's visit

## 2013-12-12 NOTE — Progress Notes (Signed)
Patient ID: Caroline Russo, female    DOB: 09-Dec-1919, 80 y.o.   MRN: [id number]  HPI Comments: Caroline Russo is a pleasant 80 year old woman with hypertension, nonobstructive coronary artery disease in 2002, diabetes, obesity who presents for routine followup of her coronary artery disease, hypertension  Previous episodes of dizziness with previous Blood pressure measurements showing orthostasis.  HCTZ was held in the past.  In follow-up, she reports that she has been diagnosed with lymphoma. She has had recurrent pleural effusion now with Pleurx catheter. She does this twice per week. Weight has decreased from her prior clinic visit in May 2015, 158.4 pounds now down to 150 pounds Daughter reports that she has been treated with Rituxan, now [place] and Rituxan with steroids Overall the patient has been doing relatively well. Blood pressure well controlled at home. Some lower extremity edema, resolves in the morning, worse at the end of the day  EKG shows normal sinus rhythm with rate 74 bpm, no significant ST or T-wave changes  Hospital records were reviewed with the patient from May 2015 when she was in the hospital. CT scan showed moderate right pleural effusion, diffuse adenopathy CT scan of the head without metastases Hospital records from 11/11/2013 detail diagnosis of follicular lymphoma with chronic right-sided pleural effusion She was discharged from the hospital October 20, admission on October 17  Other past medical history Chronic problem with her vision. She still drives, not like at nighttime. She lives at home alone, falls on occasion. Daughter checks in on her frequently She has macular degeneration.   Previous history of palpitations and event monitor was performed that showed occasional sinus bradycardia., Normal sinus rhythm mostly with frequent APCs. No other significant arrhythmia   Outpatient Encounter Prescriptions as of 78/19/2015  Medication Sig  . ALPRAZolam  (XANAX) 0.25 MG tablet Take 0.25 mg by mouth as needed.    . B-D UF III MINI PEN NEEDLES 31G X 5 MM MISC   . benzonatate (TESSALON) 100 MG capsule Take 100 mg by mouth 3 (three) times daily as needed.   . docusate sodium (COLACE) 100 MG capsule Take 100 mg by mouth 2 (two) times daily.  Marland Kitchen doxycycline (VIBRAMYCIN) 100 MG capsule Take 100 mg by mouth 2 (two) times daily.   . insulin detemir (LEVEMIR) 100 UNIT/ML injection Inject into the skin.   . Insulin Pen Needle (PEN NEEDLES) 31G X 6 MM MISC   . lactulose (CHRONULAC) 10 GM/15ML solution 10 g daily as needed.   Marland Kitchen losartan (COZAAR) 100 MG tablet Take 100 mg by mouth daily.  . meclizine (ANTIVERT) 12.5 MG tablet Take 12.5 mg by mouth 3 (three) times daily as needed.  . metoprolol succinate (TOPROL-XL) 25 MG 24 hr tablet Take 1 tablet (25 mg total) by mouth 2 (two) times daily.  . Multiple Vitamins-Minerals (CENTRUM SILVER PO) Take by mouth daily.  . Multiple Vitamins-Minerals (OCUVITE PRESERVISION PO) Take by mouth 2 (two) times daily.  . nitroGLYCERIN (NITROSTAT) 0.4 MG SL tablet Place 0.4 mg under the tongue every 5 (five) minutes as needed.    . pantoprazole (PROTONIX) 40 MG tablet TAKE 1 TABLET BY MOUTH TWICE A DAY  . polyethylene glycol powder (GLYCOLAX/MIRALAX) powder   . SENNA CO 2 tablets by Combination route daily as needed.   . hydrochlorothiazide (HYDRODIURIL) 25 MG tablet Take 1 tablet (25 mg total) by mouth daily as needed.   Social history  reports that she quit smoking about 25 years ago. Her smoking use  included Cigarettes. She has a 1.75 pack-year smoking history. She has never used smokeless tobacco. She reports that she does not drink alcohol or use illicit drugs.  Review of Systems  Constitutional: Negative.   Respiratory: Negative.   Cardiovascular: Negative.   Gastrointestinal: Negative.   Musculoskeletal: Positive for gait problem.  Skin: Negative.   Allergic/Immunologic: Negative.   Neurological: Negative.    Psychiatric/Behavioral: Negative.   All other systems reviewed and are negative.   BP 120/40 mmHg  Pulse 74  Ht 5\' 2"  (1.575 m)  Wt 149 lb 8 oz (67.813 kg)  BMI 27.34 kg/m2  Physical Exam  Constitutional: She is oriented to person, place, and time. She appears well-developed and well-nourished.  HENT:  Head: Normocephalic.  Nose: Nose normal.  Mouth/Throat: Oropharynx is clear and moist.  Eyes: Conjunctivae are normal. Pupils are equal, round, and reactive to light.  Neck: Normal range of motion. Neck supple. No JVD present.  Cardiovascular: Normal rate, regular rhythm, S1 normal, S2 normal, normal heart sounds and intact distal pulses.  Exam reveals no gallop and no friction rub.   No murmur heard. Pulmonary/Chest: Effort normal and breath sounds normal. No respiratory distress. She has no wheezes. She has no rales. She exhibits no tenderness.  Abdominal: Soft. Bowel sounds are normal. She exhibits no distension. There is no tenderness.  Musculoskeletal: Normal range of motion. She exhibits no edema or tenderness.  Lymphadenopathy:    She has no cervical adenopathy.  Neurological: She is alert and oriented to person, place, and time. Coordination normal.  Skin: Skin is warm and dry. No rash noted. No erythema.  Psychiatric: She has a normal mood and affect. Her behavior is normal. Judgment and thought content normal.    Assessment and Plan  Nursing note and vitals reviewed.

## 2013-12-12 NOTE — Assessment & Plan Note (Signed)
Currently undergoing chemotherapy, tolerating this reasonably well. Chronic right pleural effusion, now with Pleurx catheter

## 2013-12-12 NOTE — Patient Instructions (Signed)
You are doing well. No medication changes were made.  Please call us if you have new issues that need to be addressed before your next appt.  Your physician wants you to follow-up in: 6 months.  You will receive a reminder letter in the mail two months in advance. If you don't receive a letter, please call our office to schedule the follow-up appointment.   

## 2013-12-16 LAB — CBC CANCER CENTER
BASOS ABS: 0.1 x10 3/mm (ref 0.0–0.1)
BASOS PCT: 0.4 %
EOS ABS: 0.4 x10 3/mm (ref 0.0–0.7)
Eosinophil %: 2.9 %
HCT: 42.4 % (ref 35.0–47.0)
HGB: 13.7 g/dL (ref 12.0–16.0)
Lymphocyte #: 3.5 x10 3/mm (ref 1.0–3.6)
Lymphocyte %: 26.7 %
MCH: 28.1 pg (ref 26.0–34.0)
MCHC: 32.4 g/dL (ref 32.0–36.0)
MCV: 87 fL (ref 80–100)
Monocyte #: 1 x10 3/mm — ABNORMAL HIGH (ref 0.2–0.9)
Monocyte %: 7.8 %
NEUTROS PCT: 62.2 %
Neutrophil #: 8.2 x10 3/mm — ABNORMAL HIGH (ref 1.4–6.5)
Platelet: 166 x10 3/mm (ref 150–440)
RBC: 4.88 10*6/uL (ref 3.80–5.20)
RDW: 15.8 % — ABNORMAL HIGH (ref 11.5–14.5)
WBC: 13.3 x10 3/mm — ABNORMAL HIGH (ref 3.6–11.0)

## 2013-12-17 DIAGNOSIS — E119 Type 2 diabetes mellitus without complications: Secondary | ICD-10-CM | POA: Diagnosis not present

## 2013-12-17 DIAGNOSIS — C8230 Follicular lymphoma grade IIIa, unspecified site: Secondary | ICD-10-CM | POA: Diagnosis not present

## 2013-12-17 DIAGNOSIS — I10 Essential (primary) hypertension: Secondary | ICD-10-CM | POA: Diagnosis not present

## 2013-12-17 DIAGNOSIS — F419 Anxiety disorder, unspecified: Secondary | ICD-10-CM | POA: Diagnosis not present

## 2013-12-17 DIAGNOSIS — K219 Gastro-esophageal reflux disease without esophagitis: Secondary | ICD-10-CM | POA: Diagnosis not present

## 2013-12-17 DIAGNOSIS — J910 Malignant pleural effusion: Secondary | ICD-10-CM | POA: Diagnosis not present

## 2013-12-23 LAB — CBC CANCER CENTER
Basophil #: 0.2 x10 3/mm — ABNORMAL HIGH (ref 0.0–0.1)
Basophil %: 2.2 %
EOS ABS: 0.4 x10 3/mm (ref 0.0–0.7)
EOS PCT: 3.4 %
HCT: 41.6 % (ref 35.0–47.0)
HGB: 13.7 g/dL (ref 12.0–16.0)
Lymphocyte #: 3.9 x10 3/mm — ABNORMAL HIGH (ref 1.0–3.6)
Lymphocyte %: 36.4 %
MCH: 28.8 pg (ref 26.0–34.0)
MCHC: 32.8 g/dL (ref 32.0–36.0)
MCV: 88 fL (ref 80–100)
Monocyte #: 1.3 x10 3/mm — ABNORMAL HIGH (ref 0.2–0.9)
Monocyte %: 12.1 %
NEUTROS PCT: 45.9 %
Neutrophil #: 4.9 x10 3/mm (ref 1.4–6.5)
PLATELETS: 204 x10 3/mm (ref 150–440)
RBC: 4.75 10*6/uL (ref 3.80–5.20)
RDW: 16 % — ABNORMAL HIGH (ref 11.5–14.5)
WBC: 10.7 x10 3/mm (ref 3.6–11.0)

## 2013-12-24 ENCOUNTER — Ambulatory Visit: Payer: Self-pay | Admitting: Cardiothoracic Surgery

## 2013-12-24 ENCOUNTER — Ambulatory Visit: Payer: Self-pay | Admitting: Oncology

## 2013-12-24 DIAGNOSIS — R008 Other abnormalities of heart beat: Secondary | ICD-10-CM | POA: Diagnosis not present

## 2013-12-24 DIAGNOSIS — Z9049 Acquired absence of other specified parts of digestive tract: Secondary | ICD-10-CM | POA: Diagnosis not present

## 2013-12-24 DIAGNOSIS — K219 Gastro-esophageal reflux disease without esophagitis: Secondary | ICD-10-CM | POA: Diagnosis not present

## 2013-12-24 DIAGNOSIS — R5383 Other fatigue: Secondary | ICD-10-CM | POA: Diagnosis not present

## 2013-12-24 DIAGNOSIS — K5900 Constipation, unspecified: Secondary | ICD-10-CM | POA: Diagnosis not present

## 2013-12-24 DIAGNOSIS — F419 Anxiety disorder, unspecified: Secondary | ICD-10-CM | POA: Diagnosis not present

## 2013-12-24 DIAGNOSIS — I509 Heart failure, unspecified: Secondary | ICD-10-CM | POA: Diagnosis not present

## 2013-12-24 DIAGNOSIS — R739 Hyperglycemia, unspecified: Secondary | ICD-10-CM | POA: Diagnosis not present

## 2013-12-24 DIAGNOSIS — R011 Cardiac murmur, unspecified: Secondary | ICD-10-CM | POA: Diagnosis not present

## 2013-12-24 DIAGNOSIS — Z90710 Acquired absence of both cervix and uterus: Secondary | ICD-10-CM | POA: Diagnosis not present

## 2013-12-24 DIAGNOSIS — R05 Cough: Secondary | ICD-10-CM | POA: Diagnosis not present

## 2013-12-24 DIAGNOSIS — H269 Unspecified cataract: Secondary | ICD-10-CM | POA: Diagnosis not present

## 2013-12-24 DIAGNOSIS — R530 Neoplastic (malignant) related fatigue: Secondary | ICD-10-CM | POA: Diagnosis not present

## 2013-12-24 DIAGNOSIS — M545 Low back pain: Secondary | ICD-10-CM | POA: Diagnosis not present

## 2013-12-24 DIAGNOSIS — T8584XA Pain due to internal prosthetic devices, implants and grafts, not elsewhere classified, initial encounter: Secondary | ICD-10-CM | POA: Diagnosis not present

## 2013-12-24 DIAGNOSIS — Z87891 Personal history of nicotine dependence: Secondary | ICD-10-CM | POA: Diagnosis not present

## 2013-12-24 DIAGNOSIS — C8290 Follicular lymphoma, unspecified, unspecified site: Secondary | ICD-10-CM | POA: Diagnosis not present

## 2013-12-24 DIAGNOSIS — J90 Pleural effusion, not elsewhere classified: Secondary | ICD-10-CM | POA: Diagnosis not present

## 2013-12-24 DIAGNOSIS — R971 Elevated cancer antigen 125 [CA 125]: Secondary | ICD-10-CM | POA: Diagnosis not present

## 2013-12-24 DIAGNOSIS — F329 Major depressive disorder, single episode, unspecified: Secondary | ICD-10-CM | POA: Diagnosis not present

## 2013-12-24 DIAGNOSIS — Z5111 Encounter for antineoplastic chemotherapy: Secondary | ICD-10-CM | POA: Diagnosis not present

## 2013-12-24 DIAGNOSIS — I10 Essential (primary) hypertension: Secondary | ICD-10-CM | POA: Diagnosis not present

## 2013-12-24 DIAGNOSIS — Z79899 Other long term (current) drug therapy: Secondary | ICD-10-CM | POA: Diagnosis not present

## 2013-12-24 DIAGNOSIS — M8580 Other specified disorders of bone density and structure, unspecified site: Secondary | ICD-10-CM | POA: Diagnosis not present

## 2013-12-24 DIAGNOSIS — E119 Type 2 diabetes mellitus without complications: Secondary | ICD-10-CM | POA: Diagnosis not present

## 2013-12-30 DIAGNOSIS — T8584XA Pain due to internal prosthetic devices, implants and grafts, not elsewhere classified, initial encounter: Secondary | ICD-10-CM | POA: Diagnosis not present

## 2013-12-30 DIAGNOSIS — C8290 Follicular lymphoma, unspecified, unspecified site: Secondary | ICD-10-CM | POA: Diagnosis not present

## 2013-12-30 DIAGNOSIS — J90 Pleural effusion, not elsewhere classified: Secondary | ICD-10-CM | POA: Diagnosis not present

## 2013-12-30 DIAGNOSIS — I509 Heart failure, unspecified: Secondary | ICD-10-CM | POA: Diagnosis not present

## 2013-12-30 DIAGNOSIS — Z5111 Encounter for antineoplastic chemotherapy: Secondary | ICD-10-CM | POA: Diagnosis not present

## 2013-12-30 DIAGNOSIS — Z79899 Other long term (current) drug therapy: Secondary | ICD-10-CM | POA: Diagnosis not present

## 2013-12-30 LAB — COMPREHENSIVE METABOLIC PANEL
ALT: 27 U/L
Albumin: 2.7 g/dL — ABNORMAL LOW (ref 3.4–5.0)
Alkaline Phosphatase: 130 U/L — ABNORMAL HIGH
Anion Gap: 7 (ref 7–16)
BILIRUBIN TOTAL: 0.4 mg/dL (ref 0.2–1.0)
BUN: 16 mg/dL (ref 7–18)
Calcium, Total: 8.5 mg/dL (ref 8.5–10.1)
Chloride: 99 mmol/L (ref 98–107)
Co2: 29 mmol/L (ref 21–32)
Creatinine: 0.91 mg/dL (ref 0.60–1.30)
EGFR (African American): >60
Glucose: 198 mg/dL — ABNORMAL HIGH (ref 65–99)
Osmolality: 277 (ref 275–301)
Potassium: 3.5 mmol/L (ref 3.5–5.1)
SGOT(AST): 28 U/L (ref 15–37)
Sodium: 135 mmol/L — ABNORMAL LOW (ref 136–145)
Total Protein: 5.9 g/dL — ABNORMAL LOW (ref 6.4–8.2)

## 2013-12-30 LAB — CBC CANCER CENTER
BASOS ABS: 0.2 x10 3/mm — AB (ref 0.0–0.1)
Basophil %: 2.3 %
EOS PCT: 5.5 %
Eosinophil #: 0.4 x10 3/mm (ref 0.0–0.7)
HCT: 41.7 % (ref 35.0–47.0)
HGB: 13.7 g/dL (ref 12.0–16.0)
Lymphocyte #: 2.8 x10 3/mm (ref 1.0–3.6)
Lymphocyte %: 41 %
MCH: 28.6 pg (ref 26.0–34.0)
MCHC: 33 g/dL (ref 32.0–36.0)
MCV: 87 fL (ref 80–100)
Monocyte #: 0.9 x10 3/mm (ref 0.2–0.9)
Monocyte %: 12.9 %
NEUTROS ABS: 2.6 x10 3/mm (ref 1.4–6.5)
Neutrophil %: 38.3 %
PLATELETS: 206 x10 3/mm (ref 150–440)
RBC: 4.81 10*6/uL (ref 3.80–5.20)
RDW: 15.9 % — ABNORMAL HIGH (ref 11.5–14.5)
WBC: 6.8 x10 3/mm (ref 3.6–11.0)

## 2013-12-30 LAB — LACTATE DEHYDROGENASE: LDH: 181 U/L (ref 81–246)

## 2013-12-31 DIAGNOSIS — J910 Malignant pleural effusion: Secondary | ICD-10-CM | POA: Diagnosis not present

## 2013-12-31 DIAGNOSIS — E119 Type 2 diabetes mellitus without complications: Secondary | ICD-10-CM | POA: Diagnosis not present

## 2013-12-31 DIAGNOSIS — Z79899 Other long term (current) drug therapy: Secondary | ICD-10-CM | POA: Diagnosis not present

## 2013-12-31 DIAGNOSIS — Z5111 Encounter for antineoplastic chemotherapy: Secondary | ICD-10-CM | POA: Diagnosis not present

## 2013-12-31 DIAGNOSIS — K219 Gastro-esophageal reflux disease without esophagitis: Secondary | ICD-10-CM | POA: Diagnosis not present

## 2013-12-31 DIAGNOSIS — I10 Essential (primary) hypertension: Secondary | ICD-10-CM | POA: Diagnosis not present

## 2013-12-31 DIAGNOSIS — F419 Anxiety disorder, unspecified: Secondary | ICD-10-CM | POA: Diagnosis not present

## 2013-12-31 DIAGNOSIS — I509 Heart failure, unspecified: Secondary | ICD-10-CM | POA: Diagnosis not present

## 2013-12-31 DIAGNOSIS — C8230 Follicular lymphoma grade IIIa, unspecified site: Secondary | ICD-10-CM | POA: Diagnosis not present

## 2013-12-31 DIAGNOSIS — C8290 Follicular lymphoma, unspecified, unspecified site: Secondary | ICD-10-CM | POA: Diagnosis not present

## 2013-12-31 DIAGNOSIS — J90 Pleural effusion, not elsewhere classified: Secondary | ICD-10-CM | POA: Diagnosis not present

## 2013-12-31 DIAGNOSIS — T8584XA Pain due to internal prosthetic devices, implants and grafts, not elsewhere classified, initial encounter: Secondary | ICD-10-CM | POA: Diagnosis not present

## 2013-12-31 LAB — CA 125: CA 125: 130.6 U/mL — AB (ref 0.0–34.0)

## 2014-01-02 DIAGNOSIS — C8290 Follicular lymphoma, unspecified, unspecified site: Secondary | ICD-10-CM | POA: Diagnosis not present

## 2014-01-02 DIAGNOSIS — C384 Malignant neoplasm of pleura: Secondary | ICD-10-CM | POA: Diagnosis not present

## 2014-01-02 DIAGNOSIS — I509 Heart failure, unspecified: Secondary | ICD-10-CM | POA: Diagnosis not present

## 2014-01-02 DIAGNOSIS — T8584XA Pain due to internal prosthetic devices, implants and grafts, not elsewhere classified, initial encounter: Secondary | ICD-10-CM | POA: Diagnosis not present

## 2014-01-02 DIAGNOSIS — Z5111 Encounter for antineoplastic chemotherapy: Secondary | ICD-10-CM | POA: Diagnosis not present

## 2014-01-02 DIAGNOSIS — J90 Pleural effusion, not elsewhere classified: Secondary | ICD-10-CM | POA: Diagnosis not present

## 2014-01-02 DIAGNOSIS — Z79899 Other long term (current) drug therapy: Secondary | ICD-10-CM | POA: Diagnosis not present

## 2014-01-06 DIAGNOSIS — C8290 Follicular lymphoma, unspecified, unspecified site: Secondary | ICD-10-CM | POA: Diagnosis not present

## 2014-01-06 DIAGNOSIS — Z79899 Other long term (current) drug therapy: Secondary | ICD-10-CM | POA: Diagnosis not present

## 2014-01-06 DIAGNOSIS — I509 Heart failure, unspecified: Secondary | ICD-10-CM | POA: Diagnosis not present

## 2014-01-06 DIAGNOSIS — J90 Pleural effusion, not elsewhere classified: Secondary | ICD-10-CM | POA: Diagnosis not present

## 2014-01-06 DIAGNOSIS — Z5111 Encounter for antineoplastic chemotherapy: Secondary | ICD-10-CM | POA: Diagnosis not present

## 2014-01-06 DIAGNOSIS — T8584XA Pain due to internal prosthetic devices, implants and grafts, not elsewhere classified, initial encounter: Secondary | ICD-10-CM | POA: Diagnosis not present

## 2014-01-06 LAB — CBC CANCER CENTER
Basophil #: 0 x10 3/mm (ref 0.0–0.1)
Basophil %: 0.7 %
EOS ABS: 0.2 x10 3/mm (ref 0.0–0.7)
EOS PCT: 4.4 %
HCT: 40.2 % (ref 35.0–47.0)
HGB: 13 g/dL (ref 12.0–16.0)
Lymphocyte #: 0.6 x10 3/mm — ABNORMAL LOW (ref 1.0–3.6)
Lymphocyte %: 10.2 %
MCH: 28.9 pg (ref 26.0–34.0)
MCHC: 32.3 g/dL (ref 32.0–36.0)
MCV: 89 fL (ref 80–100)
Monocyte #: 1.1 x10 3/mm — ABNORMAL HIGH (ref 0.2–0.9)
Monocyte %: 18.7 %
NEUTROS PCT: 66 %
Neutrophil #: 3.7 x10 3/mm (ref 1.4–6.5)
PLATELETS: 182 x10 3/mm (ref 150–440)
RBC: 4.5 10*6/uL (ref 3.80–5.20)
RDW: 15.3 % — AB (ref 11.5–14.5)
WBC: 5.6 x10 3/mm (ref 3.6–11.0)

## 2014-01-07 DIAGNOSIS — E119 Type 2 diabetes mellitus without complications: Secondary | ICD-10-CM | POA: Diagnosis not present

## 2014-01-07 DIAGNOSIS — I10 Essential (primary) hypertension: Secondary | ICD-10-CM | POA: Diagnosis not present

## 2014-01-07 DIAGNOSIS — C8230 Follicular lymphoma grade IIIa, unspecified site: Secondary | ICD-10-CM | POA: Diagnosis not present

## 2014-01-07 DIAGNOSIS — F419 Anxiety disorder, unspecified: Secondary | ICD-10-CM | POA: Diagnosis not present

## 2014-01-07 DIAGNOSIS — J910 Malignant pleural effusion: Secondary | ICD-10-CM | POA: Diagnosis not present

## 2014-01-07 DIAGNOSIS — K219 Gastro-esophageal reflux disease without esophagitis: Secondary | ICD-10-CM | POA: Diagnosis not present

## 2014-01-12 DIAGNOSIS — I10 Essential (primary) hypertension: Secondary | ICD-10-CM | POA: Diagnosis not present

## 2014-01-12 DIAGNOSIS — F419 Anxiety disorder, unspecified: Secondary | ICD-10-CM | POA: Diagnosis not present

## 2014-01-12 DIAGNOSIS — K219 Gastro-esophageal reflux disease without esophagitis: Secondary | ICD-10-CM | POA: Diagnosis not present

## 2014-01-12 DIAGNOSIS — J910 Malignant pleural effusion: Secondary | ICD-10-CM | POA: Diagnosis not present

## 2014-01-12 DIAGNOSIS — S90921D Unspecified superficial injury of right foot, subsequent encounter: Secondary | ICD-10-CM | POA: Diagnosis not present

## 2014-01-12 DIAGNOSIS — E119 Type 2 diabetes mellitus without complications: Secondary | ICD-10-CM | POA: Diagnosis not present

## 2014-01-12 DIAGNOSIS — C8230 Follicular lymphoma grade IIIa, unspecified site: Secondary | ICD-10-CM | POA: Diagnosis not present

## 2014-01-13 DIAGNOSIS — T8584XA Pain due to internal prosthetic devices, implants and grafts, not elsewhere classified, initial encounter: Secondary | ICD-10-CM | POA: Diagnosis not present

## 2014-01-13 DIAGNOSIS — Z5111 Encounter for antineoplastic chemotherapy: Secondary | ICD-10-CM | POA: Diagnosis not present

## 2014-01-13 DIAGNOSIS — Z79899 Other long term (current) drug therapy: Secondary | ICD-10-CM | POA: Diagnosis not present

## 2014-01-13 DIAGNOSIS — I509 Heart failure, unspecified: Secondary | ICD-10-CM | POA: Diagnosis not present

## 2014-01-13 DIAGNOSIS — C8290 Follicular lymphoma, unspecified, unspecified site: Secondary | ICD-10-CM | POA: Diagnosis not present

## 2014-01-13 DIAGNOSIS — J90 Pleural effusion, not elsewhere classified: Secondary | ICD-10-CM | POA: Diagnosis not present

## 2014-01-13 LAB — CBC CANCER CENTER
BASOS PCT: 2.6 %
Basophil #: 0.1 x10 3/mm (ref 0.0–0.1)
EOS ABS: 0.2 x10 3/mm (ref 0.0–0.7)
EOS PCT: 5.7 %
HCT: 39.8 % (ref 35.0–47.0)
HGB: 13.1 g/dL (ref 12.0–16.0)
LYMPHS ABS: 0.9 x10 3/mm — AB (ref 1.0–3.6)
Lymphocyte %: 21.6 %
MCH: 28.9 pg (ref 26.0–34.0)
MCHC: 32.9 g/dL (ref 32.0–36.0)
MCV: 88 fL (ref 80–100)
MONO ABS: 0.9 x10 3/mm (ref 0.2–0.9)
Monocyte %: 22 %
Neutrophil #: 1.9 x10 3/mm (ref 1.4–6.5)
Neutrophil %: 48.1 %
PLATELETS: 180 x10 3/mm (ref 150–440)
RBC: 4.52 10*6/uL (ref 3.80–5.20)
RDW: 14.8 % — AB (ref 11.5–14.5)
WBC: 4.1 x10 3/mm (ref 3.6–11.0)

## 2014-01-20 DIAGNOSIS — C8290 Follicular lymphoma, unspecified, unspecified site: Secondary | ICD-10-CM | POA: Diagnosis not present

## 2014-01-20 DIAGNOSIS — J90 Pleural effusion, not elsewhere classified: Secondary | ICD-10-CM | POA: Diagnosis not present

## 2014-01-20 DIAGNOSIS — T8584XA Pain due to internal prosthetic devices, implants and grafts, not elsewhere classified, initial encounter: Secondary | ICD-10-CM | POA: Diagnosis not present

## 2014-01-20 DIAGNOSIS — I509 Heart failure, unspecified: Secondary | ICD-10-CM | POA: Diagnosis not present

## 2014-01-20 DIAGNOSIS — Z5111 Encounter for antineoplastic chemotherapy: Secondary | ICD-10-CM | POA: Diagnosis not present

## 2014-01-20 DIAGNOSIS — Z79899 Other long term (current) drug therapy: Secondary | ICD-10-CM | POA: Diagnosis not present

## 2014-01-20 LAB — CBC CANCER CENTER
BASOS ABS: 0.1 x10 3/mm (ref 0.0–0.1)
Basophil %: 2.8 %
EOS PCT: 4.9 %
Eosinophil #: 0.2 x10 3/mm (ref 0.0–0.7)
HCT: 41.7 % (ref 35.0–47.0)
HGB: 13.5 g/dL (ref 12.0–16.0)
LYMPHS ABS: 1.2 x10 3/mm (ref 1.0–3.6)
LYMPHS PCT: 27.7 %
MCH: 28.7 pg (ref 26.0–34.0)
MCHC: 32.5 g/dL (ref 32.0–36.0)
MCV: 88 fL (ref 80–100)
MONO ABS: 1 x10 3/mm — AB (ref 0.2–0.9)
Monocyte %: 21.9 %
NEUTROS ABS: 1.9 x10 3/mm (ref 1.4–6.5)
Neutrophil %: 42.7 %
Platelet: 134 x10 3/mm — ABNORMAL LOW (ref 150–440)
RBC: 4.73 10*6/uL (ref 3.80–5.20)
RDW: 14.6 % — ABNORMAL HIGH (ref 11.5–14.5)
WBC: 4.4 x10 3/mm (ref 3.6–11.0)

## 2014-01-22 ENCOUNTER — Ambulatory Visit: Payer: Self-pay | Admitting: Oncology

## 2014-01-22 DIAGNOSIS — C8590 Non-Hodgkin lymphoma, unspecified, unspecified site: Secondary | ICD-10-CM | POA: Diagnosis not present

## 2014-01-22 DIAGNOSIS — J90 Pleural effusion, not elsewhere classified: Secondary | ICD-10-CM | POA: Diagnosis not present

## 2014-01-24 ENCOUNTER — Ambulatory Visit: Payer: Self-pay | Admitting: Oncology

## 2014-01-24 ENCOUNTER — Ambulatory Visit: Payer: Self-pay | Admitting: Cardiothoracic Surgery

## 2014-01-24 DIAGNOSIS — R140 Abdominal distension (gaseous): Secondary | ICD-10-CM | POA: Diagnosis not present

## 2014-01-24 DIAGNOSIS — F329 Major depressive disorder, single episode, unspecified: Secondary | ICD-10-CM | POA: Diagnosis not present

## 2014-01-24 DIAGNOSIS — Z5111 Encounter for antineoplastic chemotherapy: Secondary | ICD-10-CM | POA: Diagnosis not present

## 2014-01-24 DIAGNOSIS — M545 Low back pain: Secondary | ICD-10-CM | POA: Diagnosis not present

## 2014-01-24 DIAGNOSIS — R0789 Other chest pain: Secondary | ICD-10-CM | POA: Diagnosis not present

## 2014-01-24 DIAGNOSIS — I509 Heart failure, unspecified: Secondary | ICD-10-CM | POA: Diagnosis not present

## 2014-01-24 DIAGNOSIS — R358 Other polyuria: Secondary | ICD-10-CM | POA: Diagnosis not present

## 2014-01-24 DIAGNOSIS — R971 Elevated cancer antigen 125 [CA 125]: Secondary | ICD-10-CM | POA: Diagnosis not present

## 2014-01-24 DIAGNOSIS — J90 Pleural effusion, not elsewhere classified: Secondary | ICD-10-CM | POA: Diagnosis not present

## 2014-01-24 DIAGNOSIS — R531 Weakness: Secondary | ICD-10-CM | POA: Diagnosis not present

## 2014-01-24 DIAGNOSIS — C8290 Follicular lymphoma, unspecified, unspecified site: Secondary | ICD-10-CM | POA: Diagnosis not present

## 2014-01-24 DIAGNOSIS — E876 Hypokalemia: Secondary | ICD-10-CM | POA: Diagnosis not present

## 2014-01-24 DIAGNOSIS — R200 Anesthesia of skin: Secondary | ICD-10-CM | POA: Diagnosis not present

## 2014-01-24 DIAGNOSIS — R530 Neoplastic (malignant) related fatigue: Secondary | ICD-10-CM | POA: Diagnosis not present

## 2014-01-24 DIAGNOSIS — K5900 Constipation, unspecified: Secondary | ICD-10-CM | POA: Diagnosis not present

## 2014-01-24 DIAGNOSIS — R5383 Other fatigue: Secondary | ICD-10-CM | POA: Diagnosis not present

## 2014-01-24 DIAGNOSIS — R109 Unspecified abdominal pain: Secondary | ICD-10-CM | POA: Diagnosis not present

## 2014-01-24 DIAGNOSIS — R400 Somnolence: Secondary | ICD-10-CM | POA: Diagnosis not present

## 2014-01-24 DIAGNOSIS — R05 Cough: Secondary | ICD-10-CM | POA: Diagnosis not present

## 2014-01-24 DIAGNOSIS — M8580 Other specified disorders of bone density and structure, unspecified site: Secondary | ICD-10-CM | POA: Diagnosis not present

## 2014-01-24 DIAGNOSIS — Z79899 Other long term (current) drug therapy: Secondary | ICD-10-CM | POA: Diagnosis not present

## 2014-01-24 DIAGNOSIS — R110 Nausea: Secondary | ICD-10-CM | POA: Diagnosis not present

## 2014-01-24 DIAGNOSIS — R739 Hyperglycemia, unspecified: Secondary | ICD-10-CM | POA: Diagnosis not present

## 2014-01-24 DIAGNOSIS — F419 Anxiety disorder, unspecified: Secondary | ICD-10-CM | POA: Diagnosis not present

## 2014-01-27 DIAGNOSIS — C8290 Follicular lymphoma, unspecified, unspecified site: Secondary | ICD-10-CM | POA: Diagnosis not present

## 2014-01-27 LAB — COMPREHENSIVE METABOLIC PANEL
ALBUMIN: 2.5 g/dL — AB (ref 3.4–5.0)
ALT: 21 U/L
Alkaline Phosphatase: 121 U/L — ABNORMAL HIGH
Anion Gap: 10 (ref 7–16)
BUN: 11 mg/dL (ref 7–18)
Bilirubin,Total: 0.4 mg/dL (ref 0.2–1.0)
CREATININE: 0.91 mg/dL (ref 0.60–1.30)
Calcium, Total: 7.7 mg/dL — ABNORMAL LOW (ref 8.5–10.1)
Chloride: 98 mmol/L (ref 98–107)
Co2: 28 mmol/L (ref 21–32)
EGFR (African American): >60
EGFR (Non-African Amer.): >60
GLUCOSE: 234 mg/dL — AB (ref 65–99)
OSMOLALITY: 279 (ref 275–301)
Potassium: 3.2 mmol/L — ABNORMAL LOW (ref 3.5–5.1)
SGOT(AST): 20 U/L (ref 15–37)
SODIUM: 136 mmol/L (ref 136–145)
Total Protein: 5.6 g/dL — ABNORMAL LOW (ref 6.4–8.2)

## 2014-01-27 LAB — CBC CANCER CENTER
BASOS ABS: 0.1 x10 3/mm (ref 0.0–0.1)
Basophil %: 2.6 %
EOS PCT: 9 %
Eosinophil #: 0.3 x10 3/mm (ref 0.0–0.7)
HCT: 38.8 % (ref 35.0–47.0)
HGB: 13.1 g/dL (ref 12.0–16.0)
Lymphocyte #: 0.9 x10 3/mm — ABNORMAL LOW (ref 1.0–3.6)
Lymphocyte %: 30.8 %
MCH: 29.3 pg (ref 26.0–34.0)
MCHC: 33.6 g/dL (ref 32.0–36.0)
MCV: 87 fL (ref 80–100)
MONO ABS: 0.7 x10 3/mm (ref 0.2–0.9)
MONOS PCT: 24.1 %
Neutrophil #: 1 x10 3/mm — ABNORMAL LOW (ref 1.4–6.5)
Neutrophil %: 33.5 %
Platelet: 84 x10 3/mm — ABNORMAL LOW (ref 150–440)
RBC: 4.46 10*6/uL (ref 3.80–5.20)
RDW: 14.3 % (ref 11.5–14.5)
WBC: 3.1 x10 3/mm — AB (ref 3.6–11.0)

## 2014-01-27 LAB — MAGNESIUM: Magnesium: 0.8 mg/dL — ABNORMAL LOW

## 2014-01-28 DIAGNOSIS — K219 Gastro-esophageal reflux disease without esophagitis: Secondary | ICD-10-CM | POA: Diagnosis not present

## 2014-01-28 DIAGNOSIS — I10 Essential (primary) hypertension: Secondary | ICD-10-CM | POA: Diagnosis not present

## 2014-01-28 DIAGNOSIS — E119 Type 2 diabetes mellitus without complications: Secondary | ICD-10-CM | POA: Diagnosis not present

## 2014-01-28 DIAGNOSIS — F419 Anxiety disorder, unspecified: Secondary | ICD-10-CM | POA: Diagnosis not present

## 2014-01-28 DIAGNOSIS — J910 Malignant pleural effusion: Secondary | ICD-10-CM | POA: Diagnosis not present

## 2014-01-28 DIAGNOSIS — C8230 Follicular lymphoma grade IIIa, unspecified site: Secondary | ICD-10-CM | POA: Diagnosis not present

## 2014-01-29 DIAGNOSIS — F419 Anxiety disorder, unspecified: Secondary | ICD-10-CM | POA: Diagnosis not present

## 2014-01-29 DIAGNOSIS — C8230 Follicular lymphoma grade IIIa, unspecified site: Secondary | ICD-10-CM | POA: Diagnosis not present

## 2014-01-29 DIAGNOSIS — I10 Essential (primary) hypertension: Secondary | ICD-10-CM | POA: Diagnosis not present

## 2014-01-29 DIAGNOSIS — J910 Malignant pleural effusion: Secondary | ICD-10-CM | POA: Diagnosis not present

## 2014-01-29 DIAGNOSIS — K219 Gastro-esophageal reflux disease without esophagitis: Secondary | ICD-10-CM | POA: Diagnosis not present

## 2014-01-29 DIAGNOSIS — E119 Type 2 diabetes mellitus without complications: Secondary | ICD-10-CM | POA: Diagnosis not present

## 2014-01-29 LAB — MAGNESIUM: Magnesium: 1.6 mg/dL — ABNORMAL LOW

## 2014-02-03 DIAGNOSIS — C8290 Follicular lymphoma, unspecified, unspecified site: Secondary | ICD-10-CM | POA: Diagnosis not present

## 2014-02-03 LAB — CBC CANCER CENTER
BASOS ABS: 0.1 x10 3/mm (ref 0.0–0.1)
Basophil %: 3.1 %
Eosinophil #: 0.2 x10 3/mm (ref 0.0–0.7)
Eosinophil %: 8.8 %
HCT: 38.6 % (ref 35.0–47.0)
HGB: 13 g/dL (ref 12.0–16.0)
Lymphocyte #: 0.9 x10 3/mm — ABNORMAL LOW (ref 1.0–3.6)
Lymphocyte %: 31.4 %
MCH: 29.4 pg (ref 26.0–34.0)
MCHC: 33.7 g/dL (ref 32.0–36.0)
MCV: 87 fL (ref 80–100)
MONOS PCT: 21.4 %
Monocyte #: 0.6 x10 3/mm (ref 0.2–0.9)
Neutrophil #: 1 x10 3/mm — ABNORMAL LOW (ref 1.4–6.5)
Neutrophil %: 35.3 %
PLATELETS: 120 x10 3/mm — AB (ref 150–440)
RBC: 4.43 10*6/uL (ref 3.80–5.20)
RDW: 13.8 % (ref 11.5–14.5)
WBC: 2.8 x10 3/mm — AB (ref 3.6–11.0)

## 2014-02-03 LAB — MAGNESIUM: MAGNESIUM: 1 mg/dL — AB

## 2014-02-04 DIAGNOSIS — F419 Anxiety disorder, unspecified: Secondary | ICD-10-CM | POA: Diagnosis not present

## 2014-02-04 DIAGNOSIS — E119 Type 2 diabetes mellitus without complications: Secondary | ICD-10-CM | POA: Diagnosis not present

## 2014-02-04 DIAGNOSIS — I10 Essential (primary) hypertension: Secondary | ICD-10-CM | POA: Diagnosis not present

## 2014-02-04 DIAGNOSIS — K219 Gastro-esophageal reflux disease without esophagitis: Secondary | ICD-10-CM | POA: Diagnosis not present

## 2014-02-04 DIAGNOSIS — J910 Malignant pleural effusion: Secondary | ICD-10-CM | POA: Diagnosis not present

## 2014-02-04 DIAGNOSIS — C8230 Follicular lymphoma grade IIIa, unspecified site: Secondary | ICD-10-CM | POA: Diagnosis not present

## 2014-02-06 DIAGNOSIS — J918 Pleural effusion in other conditions classified elsewhere: Secondary | ICD-10-CM | POA: Diagnosis not present

## 2014-02-06 DIAGNOSIS — J910 Malignant pleural effusion: Secondary | ICD-10-CM | POA: Diagnosis not present

## 2014-02-06 DIAGNOSIS — Z01810 Encounter for preprocedural cardiovascular examination: Secondary | ICD-10-CM | POA: Diagnosis not present

## 2014-02-06 LAB — CBC CANCER CENTER
BASOS PCT: 1.3 %
Basophil #: 0 x10 3/mm (ref 0.0–0.1)
Eosinophil #: 0.2 x10 3/mm (ref 0.0–0.7)
Eosinophil %: 5.6 %
HCT: 39.9 % (ref 35.0–47.0)
HGB: 13.3 g/dL (ref 12.0–16.0)
LYMPHS PCT: 33.3 %
Lymphocyte #: 1.1 x10 3/mm (ref 1.0–3.6)
MCH: 29.4 pg (ref 26.0–34.0)
MCHC: 33.4 g/dL (ref 32.0–36.0)
MCV: 88 fL (ref 80–100)
MONOS PCT: 19.1 %
Monocyte #: 0.6 x10 3/mm (ref 0.2–0.9)
Neutrophil #: 1.3 x10 3/mm — ABNORMAL LOW (ref 1.4–6.5)
Neutrophil %: 40.7 %
Platelet: 141 x10 3/mm — ABNORMAL LOW (ref 150–440)
RBC: 4.53 10*6/uL (ref 3.80–5.20)
RDW: 13.7 % (ref 11.5–14.5)
WBC: 3.3 x10 3/mm — AB (ref 3.6–11.0)

## 2014-02-06 LAB — COMPREHENSIVE METABOLIC PANEL
ALT: 20 U/L
AST: 21 U/L (ref 15–37)
Albumin: 2.8 g/dL — ABNORMAL LOW (ref 3.4–5.0)
Alkaline Phosphatase: 128 U/L — ABNORMAL HIGH
Anion Gap: 11 (ref 7–16)
BUN: 10 mg/dL (ref 7–18)
Bilirubin,Total: 0.4 mg/dL (ref 0.2–1.0)
CALCIUM: 8 mg/dL — AB (ref 8.5–10.1)
CHLORIDE: 99 mmol/L (ref 98–107)
CREATININE: 0.91 mg/dL (ref 0.60–1.30)
Co2: 27 mmol/L (ref 21–32)
EGFR (African American): >60
Glucose: 236 mg/dL — ABNORMAL HIGH (ref 65–99)
Osmolality: 281 (ref 275–301)
Potassium: 4 mmol/L (ref 3.5–5.1)
Sodium: 137 mmol/L (ref 136–145)
Total Protein: 6 g/dL — ABNORMAL LOW (ref 6.4–8.2)

## 2014-02-06 LAB — PROTIME-INR
INR: 1
PROTHROMBIN TIME: 12.9 s (ref 11.5–14.7)

## 2014-02-06 LAB — APTT: Activated PTT: 29.3 secs (ref 23.6–35.9)

## 2014-02-06 LAB — MAGNESIUM: MAGNESIUM: 1.1 mg/dL — AB

## 2014-02-10 ENCOUNTER — Ambulatory Visit: Payer: Self-pay

## 2014-02-10 DIAGNOSIS — C384 Malignant neoplasm of pleura: Secondary | ICD-10-CM | POA: Diagnosis not present

## 2014-02-10 DIAGNOSIS — I4891 Unspecified atrial fibrillation: Secondary | ICD-10-CM | POA: Diagnosis not present

## 2014-02-10 DIAGNOSIS — Z452 Encounter for adjustment and management of vascular access device: Secondary | ICD-10-CM | POA: Diagnosis not present

## 2014-02-10 DIAGNOSIS — C801 Malignant (primary) neoplasm, unspecified: Secondary | ICD-10-CM | POA: Diagnosis not present

## 2014-02-10 DIAGNOSIS — Z880 Allergy status to penicillin: Secondary | ICD-10-CM | POA: Diagnosis not present

## 2014-02-10 DIAGNOSIS — Z888 Allergy status to other drugs, medicaments and biological substances status: Secondary | ICD-10-CM | POA: Diagnosis not present

## 2014-02-10 DIAGNOSIS — C8290 Follicular lymphoma, unspecified, unspecified site: Secondary | ICD-10-CM | POA: Diagnosis not present

## 2014-02-10 DIAGNOSIS — E119 Type 2 diabetes mellitus without complications: Secondary | ICD-10-CM | POA: Diagnosis not present

## 2014-02-10 DIAGNOSIS — I10 Essential (primary) hypertension: Secondary | ICD-10-CM | POA: Diagnosis not present

## 2014-02-10 DIAGNOSIS — Z881 Allergy status to other antibiotic agents status: Secondary | ICD-10-CM | POA: Diagnosis not present

## 2014-02-11 DIAGNOSIS — E119 Type 2 diabetes mellitus without complications: Secondary | ICD-10-CM | POA: Diagnosis not present

## 2014-02-11 DIAGNOSIS — I10 Essential (primary) hypertension: Secondary | ICD-10-CM | POA: Diagnosis not present

## 2014-02-11 DIAGNOSIS — C8230 Follicular lymphoma grade IIIa, unspecified site: Secondary | ICD-10-CM | POA: Diagnosis not present

## 2014-02-11 DIAGNOSIS — F419 Anxiety disorder, unspecified: Secondary | ICD-10-CM | POA: Diagnosis not present

## 2014-02-11 DIAGNOSIS — C8290 Follicular lymphoma, unspecified, unspecified site: Secondary | ICD-10-CM | POA: Diagnosis not present

## 2014-02-11 DIAGNOSIS — K219 Gastro-esophageal reflux disease without esophagitis: Secondary | ICD-10-CM | POA: Diagnosis not present

## 2014-02-11 DIAGNOSIS — J910 Malignant pleural effusion: Secondary | ICD-10-CM | POA: Diagnosis not present

## 2014-02-11 LAB — CBC CANCER CENTER
Basophil #: 0 x10 3/mm (ref 0.0–0.1)
Basophil %: 1.4 %
Eosinophil #: 0.2 x10 3/mm (ref 0.0–0.7)
Eosinophil %: 4.6 %
HCT: 38.3 % (ref 35.0–47.0)
HGB: 12.7 g/dL (ref 12.0–16.0)
LYMPHS PCT: 32.8 %
Lymphocyte #: 1.1 x10 3/mm (ref 1.0–3.6)
MCH: 29.3 pg (ref 26.0–34.0)
MCHC: 33.2 g/dL (ref 32.0–36.0)
MCV: 88 fL (ref 80–100)
Monocyte #: 0.7 x10 3/mm (ref 0.2–0.9)
Monocyte %: 21.9 %
NEUTROS ABS: 1.3 x10 3/mm — AB (ref 1.4–6.5)
NEUTROS PCT: 39.3 %
Platelet: 150 x10 3/mm (ref 150–440)
RBC: 4.34 10*6/uL (ref 3.80–5.20)
RDW: 14.1 % (ref 11.5–14.5)
WBC: 3.3 x10 3/mm — AB (ref 3.6–11.0)

## 2014-02-11 LAB — MAGNESIUM: MAGNESIUM: 1 mg/dL — AB

## 2014-02-15 ENCOUNTER — Emergency Department: Payer: Self-pay | Admitting: Emergency Medicine

## 2014-02-15 DIAGNOSIS — K5900 Constipation, unspecified: Secondary | ICD-10-CM | POA: Diagnosis not present

## 2014-02-15 DIAGNOSIS — R109 Unspecified abdominal pain: Secondary | ICD-10-CM | POA: Diagnosis not present

## 2014-02-15 DIAGNOSIS — J90 Pleural effusion, not elsewhere classified: Secondary | ICD-10-CM | POA: Diagnosis not present

## 2014-02-15 DIAGNOSIS — R1030 Lower abdominal pain, unspecified: Secondary | ICD-10-CM | POA: Diagnosis not present

## 2014-02-15 DIAGNOSIS — Z880 Allergy status to penicillin: Secondary | ICD-10-CM | POA: Diagnosis not present

## 2014-02-15 DIAGNOSIS — E119 Type 2 diabetes mellitus without complications: Secondary | ICD-10-CM | POA: Diagnosis not present

## 2014-02-15 DIAGNOSIS — R195 Other fecal abnormalities: Secondary | ICD-10-CM | POA: Diagnosis not present

## 2014-02-15 DIAGNOSIS — I700 Atherosclerosis of aorta: Secondary | ICD-10-CM | POA: Diagnosis not present

## 2014-02-15 LAB — COMPREHENSIVE METABOLIC PANEL
ALBUMIN: 2.6 g/dL — AB (ref 3.4–5.0)
ALK PHOS: 76 U/L
ALT: 17 U/L
Anion Gap: 10 (ref 7–16)
BILIRUBIN TOTAL: 0.8 mg/dL (ref 0.2–1.0)
BUN: 19 mg/dL — ABNORMAL HIGH (ref 7–18)
Calcium, Total: 8.2 mg/dL — ABNORMAL LOW (ref 8.5–10.1)
Chloride: 101 mmol/L (ref 98–107)
Co2: 28 mmol/L (ref 21–32)
Creatinine: 1.2 mg/dL (ref 0.60–1.30)
EGFR (African American): 54 — ABNORMAL LOW
GFR CALC NON AF AMER: 45 — AB
GLUCOSE: 196 mg/dL — AB (ref 65–99)
Osmolality: 285 (ref 275–301)
Potassium: 3.7 mmol/L (ref 3.5–5.1)
SGOT(AST): 29 U/L (ref 15–37)
Sodium: 139 mmol/L (ref 136–145)
Total Protein: 6.1 g/dL — ABNORMAL LOW (ref 6.4–8.2)

## 2014-02-15 LAB — CBC
HCT: 38.9 % (ref 35.0–47.0)
HGB: 13 g/dL (ref 12.0–16.0)
MCH: 30 pg (ref 26.0–34.0)
MCHC: 33.5 g/dL (ref 32.0–36.0)
MCV: 90 fL (ref 80–100)
PLATELETS: 137 10*3/uL — AB (ref 150–440)
RBC: 4.34 10*6/uL (ref 3.80–5.20)
RDW: 14.4 % (ref 11.5–14.5)
WBC: 5 10*3/uL (ref 3.6–11.0)

## 2014-02-15 LAB — LIPASE, BLOOD: Lipase: 58 U/L — ABNORMAL LOW (ref 73–393)

## 2014-02-18 DIAGNOSIS — K5900 Constipation, unspecified: Secondary | ICD-10-CM | POA: Diagnosis not present

## 2014-02-18 DIAGNOSIS — C8290 Follicular lymphoma, unspecified, unspecified site: Secondary | ICD-10-CM | POA: Diagnosis not present

## 2014-02-18 DIAGNOSIS — E876 Hypokalemia: Secondary | ICD-10-CM | POA: Diagnosis not present

## 2014-02-18 LAB — CBC CANCER CENTER
BASOS PCT: 0.4 %
Basophil #: 0 x10 3/mm (ref 0.0–0.1)
EOS ABS: 0 x10 3/mm (ref 0.0–0.7)
Eosinophil %: 1 %
HCT: 36.1 % (ref 35.0–47.0)
HGB: 12.1 g/dL (ref 12.0–16.0)
LYMPHS ABS: 0.1 x10 3/mm — AB (ref 1.0–3.6)
Lymphocyte %: 2.7 %
MCH: 29.5 pg (ref 26.0–34.0)
MCHC: 33.4 g/dL (ref 32.0–36.0)
MCV: 88 fL (ref 80–100)
MONO ABS: 0.6 x10 3/mm (ref 0.2–0.9)
MONOS PCT: 24.6 %
Neutrophil #: 1.6 x10 3/mm (ref 1.4–6.5)
Neutrophil %: 71.3 %
Platelet: 146 x10 3/mm — ABNORMAL LOW (ref 150–440)
RBC: 4.08 10*6/uL (ref 3.80–5.20)
RDW: 14.2 % (ref 11.5–14.5)
WBC: 2.3 x10 3/mm — AB (ref 3.6–11.0)

## 2014-02-18 LAB — POTASSIUM: POTASSIUM: 3.1 mmol/L — AB (ref 3.5–5.1)

## 2014-02-18 LAB — MAGNESIUM: Magnesium: 0.8 mg/dL — ABNORMAL LOW

## 2014-02-19 LAB — MAGNESIUM: Magnesium: 2.3 mg/dL

## 2014-02-24 ENCOUNTER — Ambulatory Visit: Payer: Self-pay | Admitting: Cardiothoracic Surgery

## 2014-02-24 ENCOUNTER — Ambulatory Visit: Payer: Self-pay | Admitting: Oncology

## 2014-02-24 DIAGNOSIS — C8290 Follicular lymphoma, unspecified, unspecified site: Secondary | ICD-10-CM | POA: Diagnosis not present

## 2014-02-24 DIAGNOSIS — J90 Pleural effusion, not elsewhere classified: Secondary | ICD-10-CM | POA: Diagnosis not present

## 2014-02-24 DIAGNOSIS — R530 Neoplastic (malignant) related fatigue: Secondary | ICD-10-CM | POA: Diagnosis not present

## 2014-02-24 DIAGNOSIS — K5900 Constipation, unspecified: Secondary | ICD-10-CM | POA: Diagnosis not present

## 2014-02-24 DIAGNOSIS — K219 Gastro-esophageal reflux disease without esophagitis: Secondary | ICD-10-CM | POA: Diagnosis not present

## 2014-02-24 DIAGNOSIS — R634 Abnormal weight loss: Secondary | ICD-10-CM | POA: Diagnosis not present

## 2014-02-24 DIAGNOSIS — F419 Anxiety disorder, unspecified: Secondary | ICD-10-CM | POA: Diagnosis not present

## 2014-02-24 DIAGNOSIS — F329 Major depressive disorder, single episode, unspecified: Secondary | ICD-10-CM | POA: Diagnosis not present

## 2014-02-24 DIAGNOSIS — R05 Cough: Secondary | ICD-10-CM | POA: Diagnosis not present

## 2014-02-24 DIAGNOSIS — R011 Cardiac murmur, unspecified: Secondary | ICD-10-CM | POA: Diagnosis not present

## 2014-02-24 DIAGNOSIS — R630 Anorexia: Secondary | ICD-10-CM | POA: Diagnosis not present

## 2014-02-24 DIAGNOSIS — R008 Other abnormalities of heart beat: Secondary | ICD-10-CM | POA: Diagnosis not present

## 2014-02-24 DIAGNOSIS — R5383 Other fatigue: Secondary | ICD-10-CM | POA: Diagnosis not present

## 2014-02-24 DIAGNOSIS — R110 Nausea: Secondary | ICD-10-CM | POA: Diagnosis not present

## 2014-02-24 DIAGNOSIS — R739 Hyperglycemia, unspecified: Secondary | ICD-10-CM | POA: Diagnosis not present

## 2014-02-24 DIAGNOSIS — Z79899 Other long term (current) drug therapy: Secondary | ICD-10-CM | POA: Diagnosis not present

## 2014-02-24 DIAGNOSIS — R400 Somnolence: Secondary | ICD-10-CM | POA: Diagnosis not present

## 2014-02-24 DIAGNOSIS — Z5111 Encounter for antineoplastic chemotherapy: Secondary | ICD-10-CM | POA: Diagnosis not present

## 2014-02-24 DIAGNOSIS — H269 Unspecified cataract: Secondary | ICD-10-CM | POA: Diagnosis not present

## 2014-02-24 DIAGNOSIS — M545 Low back pain: Secondary | ICD-10-CM | POA: Diagnosis not present

## 2014-02-24 DIAGNOSIS — I509 Heart failure, unspecified: Secondary | ICD-10-CM | POA: Diagnosis not present

## 2014-02-24 DIAGNOSIS — M8580 Other specified disorders of bone density and structure, unspecified site: Secondary | ICD-10-CM | POA: Diagnosis not present

## 2014-02-24 DIAGNOSIS — I2510 Atherosclerotic heart disease of native coronary artery without angina pectoris: Secondary | ICD-10-CM | POA: Diagnosis not present

## 2014-02-24 DIAGNOSIS — I10 Essential (primary) hypertension: Secondary | ICD-10-CM | POA: Diagnosis not present

## 2014-02-24 LAB — CBC CANCER CENTER
Basophil #: 0 x10 3/mm (ref 0.0–0.1)
Basophil %: 1.7 %
EOS ABS: 0 x10 3/mm (ref 0.0–0.7)
Eosinophil %: 1.5 %
HCT: 38.5 % (ref 35.0–47.0)
HGB: 13 g/dL (ref 12.0–16.0)
LYMPHS PCT: 22.9 %
Lymphocyte #: 0.4 x10 3/mm — ABNORMAL LOW (ref 1.0–3.6)
MCH: 29.7 pg (ref 26.0–34.0)
MCHC: 33.9 g/dL (ref 32.0–36.0)
MCV: 88 fL (ref 80–100)
Monocyte #: 1.1 x10 3/mm — ABNORMAL HIGH (ref 0.2–0.9)
Monocyte %: 71.8 %
NEUTROS PCT: 2.1 %
Neutrophil #: 0 x10 3/mm — ABNORMAL LOW (ref 1.4–6.5)
PLATELETS: 225 x10 3/mm (ref 150–440)
RBC: 4.4 10*6/uL (ref 3.80–5.20)
RDW: 14.2 % (ref 11.5–14.5)
WBC: 1.5 x10 3/mm — CL (ref 3.6–11.0)

## 2014-02-24 LAB — COMPREHENSIVE METABOLIC PANEL
ALK PHOS: 87 U/L (ref 46–116)
Albumin: 2.7 g/dL — ABNORMAL LOW (ref 3.4–5.0)
Anion Gap: 10 (ref 7–16)
BILIRUBIN TOTAL: 0.8 mg/dL (ref 0.2–1.0)
BUN: 9 mg/dL (ref 7–18)
CHLORIDE: 93 mmol/L — AB (ref 98–107)
CREATININE: 1.21 mg/dL (ref 0.60–1.30)
Calcium, Total: 7.8 mg/dL — ABNORMAL LOW (ref 8.5–10.1)
Co2: 30 mmol/L (ref 21–32)
EGFR (African American): 54 — ABNORMAL LOW
EGFR (Non-African Amer.): 44 — ABNORMAL LOW
Glucose: 188 mg/dL — ABNORMAL HIGH (ref 65–99)
Osmolality: 270 (ref 275–301)
Potassium: 2.8 mmol/L — ABNORMAL LOW (ref 3.5–5.1)
SGOT(AST): 19 U/L (ref 15–37)
SGPT (ALT): 12 U/L — ABNORMAL LOW (ref 14–63)
Sodium: 133 mmol/L — ABNORMAL LOW (ref 136–145)
TOTAL PROTEIN: 6.4 g/dL (ref 6.4–8.2)

## 2014-02-24 LAB — MAGNESIUM: Magnesium: 0.8 mg/dL — ABNORMAL LOW

## 2014-02-25 LAB — CBC CANCER CENTER
BASOS ABS: 0 x10 3/mm (ref 0.0–0.1)
Basophil %: 2.2 %
Eosinophil #: 0 x10 3/mm (ref 0.0–0.7)
Eosinophil %: 2.7 %
HCT: 34.8 % — ABNORMAL LOW (ref 35.0–47.0)
HGB: 11.7 g/dL — AB (ref 12.0–16.0)
LYMPHS ABS: 0.2 x10 3/mm — AB (ref 1.0–3.6)
Lymphocyte %: 17 %
MCH: 29.3 pg (ref 26.0–34.0)
MCHC: 33.6 g/dL (ref 32.0–36.0)
MCV: 87 fL (ref 80–100)
MONO ABS: 1.1 x10 3/mm — AB (ref 0.2–0.9)
MONOS PCT: 76.6 %
NEUTROS ABS: 0 x10 3/mm — AB (ref 1.4–6.5)
Neutrophil %: 1.5 %
PLATELETS: 192 x10 3/mm (ref 150–440)
RBC: 3.99 10*6/uL (ref 3.80–5.20)
RDW: 14.1 % (ref 11.5–14.5)
WBC: 1.4 x10 3/mm — AB (ref 3.6–11.0)

## 2014-02-25 LAB — MAGNESIUM: Magnesium: 1.8 mg/dL

## 2014-02-26 DIAGNOSIS — T829XXA Unspecified complication of cardiac and vascular prosthetic device, implant and graft, initial encounter: Secondary | ICD-10-CM | POA: Diagnosis not present

## 2014-02-26 DIAGNOSIS — C8590 Non-Hodgkin lymphoma, unspecified, unspecified site: Secondary | ICD-10-CM | POA: Diagnosis not present

## 2014-02-26 DIAGNOSIS — C8290 Follicular lymphoma, unspecified, unspecified site: Secondary | ICD-10-CM | POA: Diagnosis not present

## 2014-02-26 LAB — CBC CANCER CENTER
BASOS PCT: 2.1 %
Basophil #: 0 x10 3/mm (ref 0.0–0.1)
Eosinophil #: 0 x10 3/mm (ref 0.0–0.7)
Eosinophil %: 2.5 %
HCT: 35.5 % (ref 35.0–47.0)
HGB: 11.9 g/dL — AB (ref 12.0–16.0)
Lymphocyte #: 0.3 x10 3/mm — ABNORMAL LOW (ref 1.0–3.6)
Lymphocyte %: 15.8 %
MCH: 29.2 pg (ref 26.0–34.0)
MCHC: 33.5 g/dL (ref 32.0–36.0)
MCV: 87 fL (ref 80–100)
MONO ABS: 1.5 x10 3/mm — AB (ref 0.2–0.9)
Monocyte %: 76.3 %
NEUTROS ABS: 0.1 x10 3/mm — AB (ref 1.4–6.5)
NEUTROS PCT: 3.3 %
Platelet: 186 x10 3/mm (ref 150–440)
RBC: 4.07 10*6/uL (ref 3.80–5.20)
RDW: 14 % (ref 11.5–14.5)
WBC: 1.9 x10 3/mm — CL (ref 3.6–11.0)

## 2014-02-26 LAB — COMPREHENSIVE METABOLIC PANEL
ALBUMIN: 2.3 g/dL — AB (ref 3.4–5.0)
ALK PHOS: 73 U/L (ref 46–116)
ALT: 11 U/L — AB (ref 14–63)
AST: 14 U/L — AB (ref 15–37)
Anion Gap: 11 (ref 7–16)
BUN: 10 mg/dL (ref 7–18)
Bilirubin,Total: 0.5 mg/dL (ref 0.2–1.0)
Calcium, Total: 7.5 mg/dL — ABNORMAL LOW (ref 8.5–10.1)
Chloride: 94 mmol/L — ABNORMAL LOW (ref 98–107)
Co2: 26 mmol/L (ref 21–32)
Creatinine: 1.14 mg/dL (ref 0.60–1.30)
EGFR (African American): 57 — ABNORMAL LOW
EGFR (Non-African Amer.): 47 — ABNORMAL LOW
GLUCOSE: 197 mg/dL — AB (ref 65–99)
Osmolality: 267 (ref 275–301)
Potassium: 2.9 mmol/L — ABNORMAL LOW (ref 3.5–5.1)
Sodium: 131 mmol/L — ABNORMAL LOW (ref 136–145)
TOTAL PROTEIN: 5.5 g/dL — AB (ref 6.4–8.2)

## 2014-02-26 LAB — MAGNESIUM: Magnesium: 1 mg/dL — ABNORMAL LOW

## 2014-02-28 LAB — MAGNESIUM: Magnesium: 2.7 mg/dL — ABNORMAL HIGH

## 2014-02-28 LAB — POTASSIUM: Potassium: 3.5 mmol/L (ref 3.5–5.1)

## 2014-03-03 LAB — CBC CANCER CENTER
BASOS PCT: 0.7 %
Basophil #: 0.1 x10 3/mm (ref 0.0–0.1)
EOS ABS: 0.1 x10 3/mm (ref 0.0–0.7)
EOS PCT: 1.3 %
HCT: 39.3 % (ref 35.0–47.0)
HGB: 13.1 g/dL (ref 12.0–16.0)
Lymphocyte #: 0.9 x10 3/mm — ABNORMAL LOW (ref 1.0–3.6)
Lymphocyte %: 11.2 %
MCH: 29 pg (ref 26.0–34.0)
MCHC: 33.3 g/dL (ref 32.0–36.0)
MCV: 87 fL (ref 80–100)
Monocyte #: 1.5 x10 3/mm — ABNORMAL HIGH (ref 0.2–0.9)
Monocyte %: 18.8 %
NEUTROS ABS: 5.5 x10 3/mm (ref 1.4–6.5)
Neutrophil %: 68 %
Platelet: 150 x10 3/mm (ref 150–440)
RBC: 4.51 10*6/uL (ref 3.80–5.20)
RDW: 14.5 % (ref 11.5–14.5)
WBC: 8.1 x10 3/mm (ref 3.6–11.0)

## 2014-03-03 LAB — COMPREHENSIVE METABOLIC PANEL
ALBUMIN: 2.5 g/dL — AB (ref 3.4–5.0)
ALT: 14 U/L (ref 14–63)
AST: 21 U/L (ref 15–37)
Alkaline Phosphatase: 116 U/L (ref 46–116)
Anion Gap: 10 (ref 7–16)
BUN: 12 mg/dL (ref 7–18)
Bilirubin,Total: 0.3 mg/dL (ref 0.2–1.0)
CALCIUM: 8 mg/dL — AB (ref 8.5–10.1)
CO2: 28 mmol/L (ref 21–32)
Chloride: 100 mmol/L (ref 98–107)
Creatinine: 0.99 mg/dL (ref 0.60–1.30)
EGFR (African American): >60
EGFR (Non-African Amer.): 56 — ABNORMAL LOW
GLUCOSE: 186 mg/dL — AB (ref 65–99)
OSMOLALITY: 280 (ref 275–301)
Potassium: 3.3 mmol/L — ABNORMAL LOW (ref 3.5–5.1)
SODIUM: 138 mmol/L (ref 136–145)
Total Protein: 5.6 g/dL — ABNORMAL LOW (ref 6.4–8.2)

## 2014-03-03 LAB — MAGNESIUM: Magnesium: 0.8 mg/dL — ABNORMAL LOW

## 2014-03-05 DIAGNOSIS — J910 Malignant pleural effusion: Secondary | ICD-10-CM | POA: Diagnosis not present

## 2014-03-25 ENCOUNTER — Ambulatory Visit
Admit: 2014-03-25 | Disposition: A | Discharge status: Home / Self Care | Payer: Self-pay | Attending: Oncology | Admitting: Oncology

## 2014-03-25 DIAGNOSIS — R634 Abnormal weight loss: Secondary | ICD-10-CM | POA: Diagnosis not present

## 2014-03-25 DIAGNOSIS — R51 Headache: Secondary | ICD-10-CM | POA: Diagnosis not present

## 2014-03-25 DIAGNOSIS — R971 Elevated cancer antigen 125 [CA 125]: Secondary | ICD-10-CM | POA: Diagnosis not present

## 2014-03-25 DIAGNOSIS — Z79899 Other long term (current) drug therapy: Secondary | ICD-10-CM | POA: Diagnosis not present

## 2014-03-25 DIAGNOSIS — R400 Somnolence: Secondary | ICD-10-CM | POA: Diagnosis not present

## 2014-03-25 DIAGNOSIS — J90 Pleural effusion, not elsewhere classified: Secondary | ICD-10-CM | POA: Diagnosis not present

## 2014-03-25 DIAGNOSIS — R739 Hyperglycemia, unspecified: Secondary | ICD-10-CM | POA: Diagnosis not present

## 2014-03-25 DIAGNOSIS — I509 Heart failure, unspecified: Secondary | ICD-10-CM | POA: Diagnosis not present

## 2014-03-25 DIAGNOSIS — R509 Fever, unspecified: Secondary | ICD-10-CM | POA: Diagnosis not present

## 2014-03-25 DIAGNOSIS — C8290 Follicular lymphoma, unspecified, unspecified site: Secondary | ICD-10-CM | POA: Diagnosis not present

## 2014-03-25 DIAGNOSIS — R110 Nausea: Secondary | ICD-10-CM | POA: Diagnosis not present

## 2014-03-25 DIAGNOSIS — R531 Weakness: Secondary | ICD-10-CM | POA: Diagnosis not present

## 2014-03-25 DIAGNOSIS — R200 Anesthesia of skin: Secondary | ICD-10-CM | POA: Diagnosis not present

## 2014-03-25 DIAGNOSIS — M8580 Other specified disorders of bone density and structure, unspecified site: Secondary | ICD-10-CM | POA: Diagnosis not present

## 2014-03-25 DIAGNOSIS — R5383 Other fatigue: Secondary | ICD-10-CM | POA: Diagnosis not present

## 2014-03-25 DIAGNOSIS — M545 Low back pain: Secondary | ICD-10-CM | POA: Diagnosis not present

## 2014-03-25 DIAGNOSIS — K5900 Constipation, unspecified: Secondary | ICD-10-CM | POA: Diagnosis not present

## 2014-03-25 DIAGNOSIS — R05 Cough: Secondary | ICD-10-CM | POA: Diagnosis not present

## 2014-03-25 DIAGNOSIS — I2510 Atherosclerotic heart disease of native coronary artery without angina pectoris: Secondary | ICD-10-CM | POA: Diagnosis not present

## 2014-03-25 DIAGNOSIS — E876 Hypokalemia: Secondary | ICD-10-CM | POA: Diagnosis not present

## 2014-03-25 DIAGNOSIS — R530 Neoplastic (malignant) related fatigue: Secondary | ICD-10-CM | POA: Diagnosis not present

## 2014-03-25 DIAGNOSIS — D72829 Elevated white blood cell count, unspecified: Secondary | ICD-10-CM | POA: Diagnosis not present

## 2014-03-25 DIAGNOSIS — Z5111 Encounter for antineoplastic chemotherapy: Secondary | ICD-10-CM | POA: Diagnosis not present

## 2014-03-25 DIAGNOSIS — R630 Anorexia: Secondary | ICD-10-CM | POA: Diagnosis not present

## 2014-03-28 DIAGNOSIS — H6503 Acute serous otitis media, bilateral: Secondary | ICD-10-CM | POA: Diagnosis not present

## 2014-03-28 DIAGNOSIS — H902 Conductive hearing loss, unspecified: Secondary | ICD-10-CM | POA: Diagnosis not present

## 2014-03-28 DIAGNOSIS — R05 Cough: Secondary | ICD-10-CM | POA: Diagnosis not present

## 2014-03-28 DIAGNOSIS — H6983 Other specified disorders of Eustachian tube, bilateral: Secondary | ICD-10-CM | POA: Diagnosis not present

## 2014-03-31 DIAGNOSIS — C8290 Follicular lymphoma, unspecified, unspecified site: Secondary | ICD-10-CM | POA: Diagnosis not present

## 2014-03-31 DIAGNOSIS — D72829 Elevated white blood cell count, unspecified: Secondary | ICD-10-CM | POA: Diagnosis not present

## 2014-03-31 DIAGNOSIS — E876 Hypokalemia: Secondary | ICD-10-CM | POA: Diagnosis not present

## 2014-04-07 DIAGNOSIS — J910 Malignant pleural effusion: Secondary | ICD-10-CM | POA: Diagnosis not present

## 2014-04-07 DIAGNOSIS — C8290 Follicular lymphoma, unspecified, unspecified site: Secondary | ICD-10-CM | POA: Diagnosis not present

## 2014-04-14 DIAGNOSIS — T50901A Poisoning by unspecified drugs, medicaments and biological substances, accidental (unintentional), initial encounter: Secondary | ICD-10-CM | POA: Diagnosis not present

## 2014-04-14 DIAGNOSIS — C8598 Non-Hodgkin lymphoma, unspecified, lymph nodes of multiple sites: Secondary | ICD-10-CM | POA: Diagnosis not present

## 2014-04-14 DIAGNOSIS — E119 Type 2 diabetes mellitus without complications: Secondary | ICD-10-CM | POA: Diagnosis not present

## 2014-04-14 DIAGNOSIS — R634 Abnormal weight loss: Secondary | ICD-10-CM | POA: Diagnosis not present

## 2014-04-15 ENCOUNTER — Ambulatory Visit: Payer: Self-pay | Admitting: Internal Medicine

## 2014-04-22 DIAGNOSIS — E876 Hypokalemia: Secondary | ICD-10-CM | POA: Diagnosis not present

## 2014-04-22 DIAGNOSIS — C8290 Follicular lymphoma, unspecified, unspecified site: Secondary | ICD-10-CM | POA: Diagnosis not present

## 2014-04-22 LAB — COMPREHENSIVE METABOLIC PANEL
ANION GAP: 1 — AB (ref 7–16)
Albumin: 2.7 g/dL — ABNORMAL LOW
Alkaline Phosphatase: 121 U/L
BILIRUBIN TOTAL: 0.8 mg/dL
BUN: 11 mg/dL
CO2: 27 mmol/L
CREATININE: 0.82 mg/dL
Calcium, Total: 7.4 mg/dL — ABNORMAL LOW
Chloride: 102 mmol/L
EGFR (African American): >60
Glucose: 166 mg/dL — ABNORMAL HIGH
Potassium: 3.4 mmol/L — ABNORMAL LOW
SGOT(AST): 33 U/L
SGPT (ALT): 18 U/L
Sodium: 130 mmol/L — ABNORMAL LOW
Total Protein: 4.9 g/dL — ABNORMAL LOW

## 2014-04-22 LAB — CBC CANCER CENTER
Basophil #: 0.2 x10 3/mm — ABNORMAL HIGH (ref 0.0–0.1)
Basophil %: 3.6 %
EOS PCT: 5.6 %
Eosinophil #: 0.3 x10 3/mm (ref 0.0–0.7)
HCT: 35.3 % (ref 35.0–47.0)
HGB: 11.8 g/dL — ABNORMAL LOW (ref 12.0–16.0)
LYMPHS ABS: 1.6 x10 3/mm (ref 1.0–3.6)
Lymphocyte %: 28.9 %
MCH: 29.5 pg (ref 26.0–34.0)
MCHC: 33.3 g/dL (ref 32.0–36.0)
MCV: 89 fL (ref 80–100)
MONOS PCT: 14.7 %
Monocyte #: 0.8 x10 3/mm (ref 0.2–0.9)
NEUTROS ABS: 2.7 x10 3/mm (ref 1.4–6.5)
Neutrophil %: 47.2 %
Platelet: 103 x10 3/mm — ABNORMAL LOW (ref 150–440)
RBC: 3.99 10*6/uL (ref 3.80–5.20)
RDW: 16.1 % — ABNORMAL HIGH (ref 11.5–14.5)
WBC: 5.6 x10 3/mm (ref 3.6–11.0)

## 2014-04-22 LAB — MAGNESIUM: Magnesium: 1.1 mg/dL — ABNORMAL LOW

## 2014-04-25 ENCOUNTER — Ambulatory Visit
Admit: 2014-04-25 | Disposition: A | Discharge status: Home / Self Care | Payer: Self-pay | Attending: Oncology | Admitting: Oncology

## 2014-04-25 DIAGNOSIS — E876 Hypokalemia: Secondary | ICD-10-CM | POA: Diagnosis not present

## 2014-04-25 DIAGNOSIS — Z5111 Encounter for antineoplastic chemotherapy: Secondary | ICD-10-CM | POA: Diagnosis not present

## 2014-04-25 DIAGNOSIS — Z90710 Acquired absence of both cervix and uterus: Secondary | ICD-10-CM | POA: Diagnosis not present

## 2014-04-25 DIAGNOSIS — Z8051 Family history of malignant neoplasm of kidney: Secondary | ICD-10-CM | POA: Diagnosis not present

## 2014-04-25 DIAGNOSIS — J90 Pleural effusion, not elsewhere classified: Secondary | ICD-10-CM | POA: Diagnosis not present

## 2014-04-25 DIAGNOSIS — Z79899 Other long term (current) drug therapy: Secondary | ICD-10-CM | POA: Diagnosis not present

## 2014-04-25 DIAGNOSIS — C8290 Follicular lymphoma, unspecified, unspecified site: Secondary | ICD-10-CM | POA: Diagnosis not present

## 2014-04-25 DIAGNOSIS — Z87891 Personal history of nicotine dependence: Secondary | ICD-10-CM | POA: Diagnosis not present

## 2014-04-25 DIAGNOSIS — E119 Type 2 diabetes mellitus without complications: Secondary | ICD-10-CM | POA: Diagnosis not present

## 2014-04-25 DIAGNOSIS — R200 Anesthesia of skin: Secondary | ICD-10-CM | POA: Diagnosis not present

## 2014-04-25 DIAGNOSIS — M545 Low back pain: Secondary | ICD-10-CM | POA: Diagnosis not present

## 2014-04-25 DIAGNOSIS — R971 Elevated cancer antigen 125 [CA 125]: Secondary | ICD-10-CM | POA: Diagnosis not present

## 2014-04-25 DIAGNOSIS — R531 Weakness: Secondary | ICD-10-CM | POA: Diagnosis not present

## 2014-04-25 DIAGNOSIS — Z9049 Acquired absence of other specified parts of digestive tract: Secondary | ICD-10-CM | POA: Diagnosis not present

## 2014-04-25 DIAGNOSIS — I10 Essential (primary) hypertension: Secondary | ICD-10-CM | POA: Diagnosis not present

## 2014-04-25 DIAGNOSIS — R51 Headache: Secondary | ICD-10-CM | POA: Diagnosis not present

## 2014-04-25 DIAGNOSIS — I499 Cardiac arrhythmia, unspecified: Secondary | ICD-10-CM | POA: Diagnosis not present

## 2014-04-25 DIAGNOSIS — F419 Anxiety disorder, unspecified: Secondary | ICD-10-CM | POA: Diagnosis not present

## 2014-04-25 DIAGNOSIS — H269 Unspecified cataract: Secondary | ICD-10-CM | POA: Diagnosis not present

## 2014-04-25 DIAGNOSIS — R5383 Other fatigue: Secondary | ICD-10-CM | POA: Diagnosis not present

## 2014-04-25 DIAGNOSIS — F329 Major depressive disorder, single episode, unspecified: Secondary | ICD-10-CM | POA: Diagnosis not present

## 2014-04-25 DIAGNOSIS — M791 Myalgia: Secondary | ICD-10-CM | POA: Diagnosis not present

## 2014-04-25 DIAGNOSIS — R509 Fever, unspecified: Secondary | ICD-10-CM | POA: Diagnosis not present

## 2014-04-25 DIAGNOSIS — K219 Gastro-esophageal reflux disease without esophagitis: Secondary | ICD-10-CM | POA: Diagnosis not present

## 2014-04-26 LAB — CREATINE: Creat: 0.82

## 2014-04-28 ENCOUNTER — Telehealth: Payer: Self-pay

## 2014-04-28 NOTE — Telephone Encounter (Signed)
Spoke w/ pt's caregiver, Dixie.  She reports that pt has been c/o swelling in her legs & feet for several months now, but caregiver & family noticed it this weekend.  Reports that pt does not have much appetite due to chemo, has lost 30 lbs recently and is no longer on insulin or other DM meds. Caregiver believes that swelling is r/t chemo, but pt would like Dr. Donivan Scull opinion, as she is concerned that sx are heart related.  Please advise.  Thank you.

## 2014-04-28 NOTE — Telephone Encounter (Signed)
She may need appointment Swelling could be from anything including anemia, poor nutrition/weight loss and low protein, IV fluids given with chemotherapy and CHF Would start with compression hose, leg elevation Also would recommend she talk with oncology as this could be a common finding with people in chemotherapy

## 2014-04-28 NOTE — Telephone Encounter (Signed)
Pt c/o swelling: STAT is pt has developed SOB within 24 hours  1. How long have you been experiencing swelling? Off and on for 3 weeks  2. Where is the swelling located? Right leg and foot, but both feet are swelling, right leg, and foot, ankle is bigger, is wearing stockings today  3.  Are you currently taking a "fluid pill"? She's not sure  4.  Are you currently SOB? no  5.  Have you traveled recently?no

## 2014-04-29 DIAGNOSIS — C8290 Follicular lymphoma, unspecified, unspecified site: Secondary | ICD-10-CM | POA: Diagnosis not present

## 2014-04-29 DIAGNOSIS — Z79899 Other long term (current) drug therapy: Secondary | ICD-10-CM | POA: Diagnosis not present

## 2014-04-29 DIAGNOSIS — E876 Hypokalemia: Secondary | ICD-10-CM | POA: Diagnosis not present

## 2014-04-29 LAB — CBC CANCER CENTER
BASOS ABS: 0.2 x10 3/mm — AB (ref 0.0–0.1)
BASOS PCT: 2.8 %
EOS ABS: 0.3 x10 3/mm (ref 0.0–0.7)
Eosinophil %: 5.3 %
HCT: 35.7 % (ref 35.0–47.0)
HGB: 11.9 g/dL — AB (ref 12.0–16.0)
LYMPHS ABS: 2.2 x10 3/mm (ref 1.0–3.6)
Lymphocyte %: 36.7 %
MCH: 30 pg (ref 26.0–34.0)
MCHC: 33.3 g/dL (ref 32.0–36.0)
MCV: 90 fL (ref 80–100)
MONO ABS: 0.8 x10 3/mm (ref 0.2–0.9)
Monocyte %: 13.2 %
NEUTROS ABS: 2.5 x10 3/mm (ref 1.4–6.5)
Neutrophil %: 42 %
Platelet: 130 x10 3/mm — ABNORMAL LOW (ref 150–440)
RBC: 3.96 10*6/uL (ref 3.80–5.20)
RDW: 16.9 % — ABNORMAL HIGH (ref 11.5–14.5)
WBC: 5.9 x10 3/mm (ref 3.6–11.0)

## 2014-04-29 LAB — POTASSIUM: Potassium: 3.4 mmol/L — ABNORMAL LOW

## 2014-04-29 LAB — MAGNESIUM: MAGNESIUM: 1.1 mg/dL — AB

## 2014-04-29 NOTE — Telephone Encounter (Signed)
Spoke w/ pt.  Advised her of Dr. Donivan Scull recommendation.  She verbalizes understanding and reports that she has an appt at [facility] today at 1:45. She will call back if we can be of further assistance after seeing them.

## 2014-05-06 DIAGNOSIS — J910 Malignant pleural effusion: Secondary | ICD-10-CM | POA: Diagnosis not present

## 2014-05-06 LAB — MAGNESIUM: MAGNESIUM: 1.3 mg/dL — AB

## 2014-05-13 ENCOUNTER — Ambulatory Visit
Admit: 2014-05-13 | Disposition: A | Discharge status: Home / Self Care | Payer: Self-pay | Attending: Oncology | Admitting: Oncology

## 2014-05-13 DIAGNOSIS — C8298 Follicular lymphoma, unspecified, lymph nodes of multiple sites: Secondary | ICD-10-CM | POA: Diagnosis not present

## 2014-05-13 DIAGNOSIS — C8590 Non-Hodgkin lymphoma, unspecified, unspecified site: Secondary | ICD-10-CM | POA: Diagnosis not present

## 2014-05-15 DIAGNOSIS — C8290 Follicular lymphoma, unspecified, unspecified site: Secondary | ICD-10-CM | POA: Diagnosis not present

## 2014-05-15 DIAGNOSIS — Z79899 Other long term (current) drug therapy: Secondary | ICD-10-CM | POA: Diagnosis not present

## 2014-05-15 LAB — LACTATE DEHYDROGENASE: LDH: 181 U/L

## 2014-05-15 LAB — COMPREHENSIVE METABOLIC PANEL
ALBUMIN: 2.7 g/dL — AB
ANION GAP: 6 — AB (ref 7–16)
AST: 31 U/L
Alkaline Phosphatase: 104 U/L
BUN: 9 mg/dL
Bilirubin,Total: 0.6 mg/dL
Calcium, Total: 8.1 mg/dL — ABNORMAL LOW
Chloride: 101 mmol/L
Co2: 27 mmol/L
Creatinine: 0.75 mg/dL
Glucose: 213 mg/dL — ABNORMAL HIGH
Potassium: 3.3 mmol/L — ABNORMAL LOW
SGPT (ALT): 14 U/L
Sodium: 134 mmol/L — ABNORMAL LOW
TOTAL PROTEIN: 5.3 g/dL — AB

## 2014-05-15 LAB — CBC CANCER CENTER
BASOS ABS: 0.1 x10 3/mm (ref 0.0–0.1)
Basophil %: 2.3 %
EOS PCT: 3.8 %
Eosinophil #: 0.2 x10 3/mm (ref 0.0–0.7)
HCT: 35.5 % (ref 35.0–47.0)
HGB: 11.7 g/dL — ABNORMAL LOW (ref 12.0–16.0)
Lymphocyte #: 1.6 x10 3/mm (ref 1.0–3.6)
Lymphocyte %: 36.4 %
MCH: 30 pg (ref 26.0–34.0)
MCHC: 33 g/dL (ref 32.0–36.0)
MCV: 91 fL (ref 80–100)
Monocyte #: 0.7 x10 3/mm (ref 0.2–0.9)
Monocyte %: 15.8 %
NEUTROS PCT: 41.7 %
Neutrophil #: 1.9 x10 3/mm (ref 1.4–6.5)
Platelet: 126 x10 3/mm — ABNORMAL LOW (ref 150–440)
RBC: 3.9 10*6/uL (ref 3.80–5.20)
RDW: 16.4 % — AB (ref 11.5–14.5)
WBC: 4.5 x10 3/mm (ref 3.6–11.0)

## 2014-05-15 LAB — MAGNESIUM: Magnesium: 1.4 mg/dL — ABNORMAL LOW

## 2014-05-17 NOTE — Discharge Summary (Signed)
Dates of Admission and Diagnosis:  Date of Admission 09-Nov-2013   Date of Discharge 12-Nov-2013   Admitting Diagnosis pleural effusion   Final Diagnosis Recurrent malignant pleural effusion. Follicular lymphoma Anxiety Diabetes    Chief Complaint/History of Present Illness a 81 year old female who presents to the Emergency Room due to worsening shortness of breath over the past 2 to 3 days. The patient does have a history of a follicular lymphoma with a malignant right-sided pleural effusion, which has been tapped in the past 2 weeks. She now returns with worsening shortness of breath and a chest x-ray showed a worsening right-sided pleural effusion. The hospitalist services were contacted for further treatment and evaluation. The patient also admits to poor p.o. intake, some nausea. She denies any fevers, chills, or any other associated symptoms presently. Given her worsening right-sided pleural effusion, hospitalist services were contacted for further treatment and evaluation.   Allergies:  PCN: Other  Codeine: Other  Ampicillin: Other  Unasyn: Unknown  Prednisone: Alt Ment Status  Pertinent Past History:  Pertinent Past History diabetes, hypertension, history of follicular lymphoma, currently undergoing chemotherapy, GERD, anxiety.   Hospital Course:  Hospital Course a 81 year old female with a history of diabetes, hypertension, history of follicular lymphoma, currently getting chemotherapy, gastroesophageal reflux disease, anxiety who presents to the hospital with shortness of breath and noted to have worsening right-sided pleural effusion.   * Shortness of breath. related to the right-sided pleural effusion,   malignant and recurrent in nature. continue supportive care with oxygen as needed.  repeated Xray slight worse effusion,IVlasix- spoke tofamily, Radiologist and oncologist.   Pt may need permanent catheter- family agreed with taht.   floroscopy guided catheter placed  and 1 ltr fluid removed 11/11/13.  will arrange for home visitng nurse to manage effusion.   will cehck Echo before d/c today.  * Follicular lymphoma.  currently undergoing chemotherapy, follows with Dr. Oliva Bustard.   an oncology consultation. palliative care consultation to discuss goals of care with the family. She remains a full code.   * Leukocytosis. This is likely due to recent Neulasta she received. There is no acute infectious source.   follow WBCs. * Diabetes.  on sliding scale insulin. Hold her Levemir for now.  * Gastroesophageal reflux disease. Continue Protonix.  *  Anxiety. Continue Xanax   Condition on Discharge Stable   Code Status:  Code Status Full Code   DISCHARGE INSTRUCTIONS HOME MEDS:  Medication Reconciliation: Patient's Home Medications at Discharge:     Medication Instructions  metoprolol 25 mg oral tablet  1 tab(s) orally 2 times a day   ocuvite antioxidant multiple vitamins and minerals oral capsule  1 cap(s) orally once a day   nitroglycerin 0.4 mg sublingual tablet  1 tab(s) sublingual every 5 minutes up to 3 doses as needed for chest pain. *If no relief call md or go to emergency room*   antivert 12.5 mg oral tablet  1 tab(s) orally 3 times a day, As Needed   hydralazine 50 mg oral tablet  1 tab(s) orally 3 times a day, As Needed   docusate sodium 100 mg oral tablet  1 tab(s) orally 2 times a day   prilosec otc 20 mg oral delayed release tablet  1 tab(s) orally once a day IN THE EVENING   miralax - oral powder for reconstitution  17 gram(s) in 8oz of fluid and drink orally once a day as needed for constipation.   protonix 40 mg oral delayed release tablet  1 tab(s) orally once a day (in the morning)   alprazolam 0.25 mg oral tablet  1 tab(s) orally every 6 hours as needed for anxiety, nervousness.   acetaminophen-hydrocodone 325 mg-5 mg oral tablet  1 tab(s) orally every 4 hours, As Needed, moderate pain (4-6/10) , As needed, moderate pain (4-6/10)    levemir 100 units/ml subcutaneous solution  20 units subcutaneous once a day (in the morning) and 6 units subcutaneous once a day (at bedtime).    hydrochlorothiazide-losartan 12.5 mg-100 mg oral tablet  1 tab(s) orally once a day    STOP TAKING THE FOLLOWING MEDICATION(S):    oxycodone 5 mg oral tablet: 1 tab(s) orally every 4 hours, As Needed b  Physician's Instructions:  Home Health? Yes   Home Health Service Physicial Therapy  Nurse   Treatments Drain pleural effsuion twice weekly.   Dressing Care A dry bandage has been applied to your wound.  Keep this bandage clean and dry.   Home Oxygen? Yes   Oxygen delivery at home: 2L  Nasal Cannula   Diet Low Sodium  Low Fat, Low Cholesterol  Carbohydrate Controlled (ADA) Diet   Dietary Supplements Glucerna   Dietary Supplements Frequency Two times per day   Activity Limitations As tolerated   Return to Work Not Applicable   Time frame for Follow Up Appointment 1-2 weeks  Dr. Belinda Block K(Ordered): [facility], [address], [place], [place], [place], [place] [address], [place] [phone]   Luan Pulling, James(Family Physician): [facility], [address], [place], [place] [address], [place] [phone]  TIME SPENT:  Total Time: Greater than 30 minutes   Electronic Signatures: Vaughan Basta (MD)  (Signed 23-Oct-15 20:29)  Authored: ADMISSION DATE AND DIAGNOSIS, CHIEF COMPLAINT/HPI, Allergies, PERTINENT PAST HISTORY, HOSPITAL COURSE, [place], PATIENT INSTRUCTIONS, Follow Up Physician, TIME SPENT   Last Updated: 23-Oct-15 20:29 by Vaughan Basta (MD)

## 2014-05-17 NOTE — Consult Note (Signed)
PATIENT NAME:  Caroline Russo, Caroline Russo MR#:  [id number] DATE OF BIRTH:  Oct 30, 1919  DATE OF CONSULTATION:  11/10/2013  REFERRING PHYSICIAN:  Vivek J. Cherlynn Kaiser, MD  CONSULTING PHYSICIAN:  Valoria Tamburri R. Sherrlyn Hock, MD  REASON FOR CONSULTATION: Lymphoma with recurrent right-sided pleural effusion.   HISTORY OF PRESENT ILLNESS: The patient is a 81 year old female patient admitted to the hospital yesterday with complaints of progressive dyspnea on minimal exertion and sometimes at rest. The patient has a known history of follicular lymphoma diagnosed in June 2015 and treated by Dr. Doylene Canning initially with a course of single agent Rituxan  and then had a PET scan in September which showed progression of lymphoma. She then received stronger treatment with cycle 1 of Treanda/Rituxan on October 12 and 13 followed by a Neulasta injection on October 14. The patient states that  she had severe aching in the back and other bones after she got the Neulasta injection and also noticed abdominal bloating, gas and discomfort. Chest x-ray showed recurrent progressive right pleural effusion. The patient's family is concerned that she has had this drained at least 4 or 5 times now in the last few months, and it is coming back very quickly and causing symptoms. The patient otherwise denies major pain issues. No fevers today. Denies definite fevers or chills at home. WBC count was elevated at 54,000 yesterday and is 48.7 today. Pleural fluid cytology has been negative for malignant cells in the past.   PAST MEDICAL/SURGICAL HISTORY:  1.  Follicular lymphoma as described above.  2.  Diabetes.  3.  Hypertension.  4.  GERD.  5.  Anxiety.   FAMILY HISTORY: Remarkable for heart disease.   SOCIAL HISTORY: Denies smoking or alcohol usage. The patient lives by herself.   HOME MEDICATIONS: Colace 100 mg b.i.d., Antivert 12.5 mg t.i.d. p.r.n., hydralazine 50 mg t.i.d. as needed, Levemir 20 units in the morning and 6 units in the evening,  losartan 100 mg daily, metoprolol tartrate 25 mg b.i.d., MiraLax daily as needed, multivitamin 1 daily, sublingual nitroglycerin as needed, Ocuvite vitamin 1 tablet daily, oxycodone 5 mg q.4 h. p.r.n. for pain, Prilosec 20 mg daily, Protonix 40 mg daily, Xanax 0.25 mg q.6 h. p.r.n.   ALLERGIES: INCLUDE AMPICILLIN, CODEINE, PENICILLIN AND PREDNISONE WHICH CAUSE AN ALTERED MENTAL STATUS.   REVIEW OF SYSTEMS:  CONSTITUTIONAL: Fatigue and dyspnea on exertion. Appetite is fair. No fevers or chills today.  HEENT: Currently denies any dizziness at rest or headaches. No epistaxis or ear or jaw pain. No sinus symptoms.  CARDIAC: No recent angina, palpitations, orthopnea or PND.  LUNGS: Having some dry cough along with the progressive dyspnea on exertion. No hemoptysis.  GASTROINTESTINAL: Chronic constipation issues. Denies any nausea, vomiting, diarrhea or blood in stools.  GENITOURINARY: No dysuria or hematuria.  SKIN: No new rashes or pruritus.  HEMATOLOGIC: No bleeding issues.  EXTREMITIES: No new swelling or pain.  MUSCULOSKELETAL: Recent bone pains after taking Neulasta injections, better today.  NEUROLOGIC: No new focal weakness, seizures or loss of consciousness.  ENDOCRINE: No polyuria or polydipsia.   PHYSICAL EXAMINATION:  GENERAL: The patient is an elderly, moderately built and nourished individual, sitting in a recliner chair on nasal cannula oxygen. Alert and oriented and converses appropriately. No acute distress or tachypnea on speaking. No icterus. Mild pallor.  VITAL SIGNS: 98, 78, 18, 165/95, 95% on 2 liters oxygen.  HEENT: Normocephalic, atraumatic. Extraocular movements intact. No oral thrush.  NECK: Negative for lymphadenopathy.  CARDIOVASCULAR: S1, S2. Regular rate  and rhythm.  LUNGS: Show bilateral diminished breath sounds overall, more so in the right lower lobe area. No crepitations or rhonchi.  ABDOMEN: Soft, mildly distended but no guarding or rigidity. Bowel sounds  normally heard. No organomegaly clinically.  EXTREMITIES: Show no major edema or cyanosis.  SKIN: Shows no generalized rashes or bruising.  NEUROLOGIC: Limited exam. Cranial nerves seem intact. Moves all extremities spontaneously.   LABORATORY RESULTS: WBC 48.7, hemoglobin 11.5, platelets 221,000 with 84% neutrophils, 4% bands, 1% metamyelocyte. Creatinine 0.78, calcium 8.4. BNP 9091. CK-MB 1.2.   CHEST X-RAY: Shows interval development of bilateral pleural effusions, moderate to large on the right and small on the left.   IMPRESSION AND RECOMMENDATIONS: The patient with a history of advanced stage IV follicular lymphoma and other multiple medical problems as described above who recently completed cycle 1 chemotherapy with Treanda/Rituxan followed by a Neulasta injection on 11/06/2013. The patient is now admitted with recurrent shortness of breath. Chest x-ray shows that right pleural effusion is again enlarging despite the fact that she has had multiple right-sided thoracenteses in the last few months. Bone pains and some of her abdominal symptoms could be secondary to the Neulasta injection and also if she does have ongoing constipation. I recommend aggressively treating this. Regarding her recurrent right pleural effusion, the patient is symptomatic, and I have discussed options including pursuing Pleurx catheter placement as a more long-term solution so that she can intermittently drain the recurrent effusions for symptom management. I have explained that if the pleural effusion is secondary to lymphoma and if it responds to chemotherapy, then it should subsequently improve. Other possibility is recurrent pleural effusion from some other etiology. The patient otherwise does not have any major pain issues at this time. I have requested radiology for CT-guided placement of right Pleurx catheter as soon as possible. Leucocytosis could be from recent Neulasta injection effect but continue to monitor for  fevers or symptoms of infection and start antibiotics accordingly. Will request her primary oncologist, Dr. Doylene Canning, to continue followup on Monday, October 19, onwards. The patient and her daughter are present at bedside and were explained the above details. They are agreeable to this plan.   Thank you for the referral. Please feel free to contact me if any additional questions    ____________________________ Maren Reamer. Sherrlyn Hock, MD srp:JT D: 11/10/2013 13:54:08 ET T: 11/10/2013 14:14:04 ET JOB#: [id number]  cc: Jashae Wiggs R. Sherrlyn Hock, MD, <Dictator> Wille Celeste MD ELECTRONICALLY SIGNED 11/11/2013 8:50

## 2014-05-17 NOTE — H&P (Signed)
PATIENT NAME:  Caroline Russo, Caroline Russo MR#:  [id number] DATE OF BIRTH:  03/02/1919  DATE OF ADMISSION:  11/09/2013  PRIMARY CARE PHYSICIAN: Dr. Larene Beach.   ONCOLOGIST: Dr. Oliva Bustard.   CHIEF COMPLAINT: Shortness of breath.   HISTORY OF PRESENT ILLNESS: This is a 81 year old female who presents to the Emergency Room due to worsening shortness of breath over the past 2 to 3 days. The patient does have a history of a follicular lymphoma with a malignant right-sided pleural effusion, which has been tapped in the past 2 weeks. She now returns with worsening shortness of breath and a chest x-ray showed a worsening right-sided pleural effusion. The hospitalist services were contacted for further treatment and evaluation. The patient also admits to poor p.o. intake, some nausea. She denies any fevers, chills, or any other associated symptoms presently. Given her worsening right-sided pleural effusion, hospitalist services were contacted for further treatment and evaluation.   REVIEW OF SYSTEMS: CONSTITUTIONAL: No documented fever. Positive 10 pound weight gain. No weight loss.  EYES: No blurred or double vision.  ENT: No tinnitus. No postnasal drip. No redness of the oropharynx.  RESPIRATORY: No cough, no wheeze. Positive shortness of breath.  CARDIOVASCULAR: No chest pain. No orthopnea. No palpitations. No syncope.  GASTROINTESTINAL: No nausea, no vomiting, diarrhea. No abdominal pain. No melena or hematochezia.  GENITOURINARY: No dysuria or hematuria.  ENDOCRINE: No polyuria or nocturia. No heat or cold intolerance.  HEMATOLOGIC: No anemia. No bruising. No bleeding.  INTEGUMENTARY: No rashes. No lesions.  MUSCULOSKELETAL: No arthritis. No swelling. No gout.  NEUROLOGIC: No numbness or tingling. No ataxia. No seizure activity.  PSYCHIATRIC: Positive anxiety. No insomnia. No ADD.   PAST MEDICAL HISTORY: Consistent with diabetes, hypertension, history of follicular lymphoma, currently undergoing  chemotherapy, GERD, anxiety.   SOCIAL HISTORY: No smoking. No alcohol abuse. No illicit drug abuse. Lives by herself.   FAMILY HISTORY: Mother and father are both deceased. Mother died from an MI at age 4. Father died from tuberculosis at age 20.   CURRENT MEDICATIONS: As follows: Antivert 12.5 mg t.i.d. as needed, Colace 100 mg b.i.d., hydralazine 50 mg t.i.d. as needed, Levemir 20 units in the morning 6 units in the evening, losartan 100 mg daily, metoprolol tartrate 25 mg b.i.d., MiraLax daily as needed, multivitamin daily, sublingual nitroglycerin as needed, Ocuvite vitamin 1 tablet daily, oxycodone 5 mg every 4 hours as needed, Prilosec 20 mg daily, Protonix 40 mg daily, Xanax 0.25 mg every 6 hours as needed for anxiety.   PHYSICAL EXAMINATION: Presently is as follows:  VITAL SIGNS: Temperature 98.3, pulse 60, respirations 19, blood pressure 157/48, saturation 100% on 3 liters nasal cannula.  GENERAL: She is a pleasant-appearing female in no apparent distress.  HEAD, EYES, EARS, NOSE, AND THROAT: Atraumatic, normocephalic. Extraocular muscles are intact. Pupils equal and reactive to light. Sclerae anicteric. No conjunctival injection. No pharyngeal erythema.  NECK: Supple. There is no jugular venous distention. No bruits, no lymphadenopathy, no thyromegaly.  HEART: Regular rate and rhythm. No murmurs, no rubs, no clicks.  LUNGS: She has poor air entry on the right midlung field downwards to the right lower lung fields. No wheezing, no rhonchi. Negative use of accessory muscles. No dullness to percussion.  ABDOMEN: Soft, flat, nontender, nondistended. Has good bowel sounds. No hepatosplenomegaly appreciated.  EXTREMITIES: No evidence of any cyanosis, clubbing, or peripheral edema. Has +2 pedal and radial pulses bilaterally.  NEUROLOGIC: The patient is alert, awake, and oriented x 3 with no focal motor  or sensory deficits appreciated bilaterally.  SKIN: Moist and warm with no rashes  appreciated.  LYMPHATIC: There is no cervical or axillary lymphadenopathy.   LABORATORY DATA: Serum glucose of 157, BUN 15, creatinine 0.8, sodium 132, potassium 4.1, chloride 94, bicarbonate 28, albumin 3.1. Otherwise, LFTs within normal limits. Troponin less than 0.02. White cell count 54.1, hemoglobin 13.7, hematocrit 42.6, platelet count 240,000.   The patient did have a chest x-ray done which showed interval development of bilateral pleural effusions, moderate to large on the right and small on the left.    ASSESSMENT AND PLAN: This is a 81 year old female with a history of diabetes, hypertension, history of follicular lymphoma, currently getting chemotherapy, gastroesophageal reflux disease, anxiety who presents to the hospital with shortness of breath and noted to have worsening right-sided pleural effusion.  1.  Shortness of breath. This is likely related to the right-sided pleural effusion, which is malignant and recurrent in nature. For now, we will continue supportive care with oxygen supplementation. The patient likely will need a therapeutic thoracentesis to be done this coming Monday.  2.  Right-sided pleural effusion. This was likely malignant and therefore recurrent in nature. The patient likely needs a therapeutic thoracentesis to be done this coming Monday. The patient also probably needs to be considered for possible pigtail catheter as this is a recurrent malignant effusion, but I will defer this to the patient's oncologist.  3.  Follicular lymphoma. The patient is currently undergoing chemotherapy, follows with Dr. Oliva Bustard. I will get an oncology consultation. We will get a palliative care consultation to discuss goals of care with the family. She remains a full code.  4.  Leukocytosis. This is likely due to recent Neulasta she received. There is no acute infectious source. I will follow her white cell count.  5.  Diabetes. Her p.o. intake is poor, therefore, I will place her on just  sliding scale insulin. Hold her Levemir for now.  6.  Gastroesophageal reflux disease. Continue Protonix.  7.  Anxiety. Continue Xanax.   CODE STATUS: The patient is a full code.   TIME SPENT ON ADMISSION: 50 minutes.    ____________________________ Belia Heman. Verdell Carmine, MD vjs:at D: 11/09/2013 18:51:46 ET T: 11/09/2013 19:54:57 ET JOB#: [id number]  cc: Belia Heman. Verdell Carmine, MD, <Dictator> Henreitta Leber MD ELECTRONICALLY SIGNED 12/02/2013 11:00

## 2014-05-22 DIAGNOSIS — C8290 Follicular lymphoma, unspecified, unspecified site: Secondary | ICD-10-CM | POA: Diagnosis not present

## 2014-05-23 LAB — MAGNESIUM: Magnesium: 1.3 mg/dL — ABNORMAL LOW

## 2014-05-25 NOTE — Op Note (Signed)
PATIENT NAME:  Caroline Russo, Caroline Russo MR#:  [id number] DATE OF BIRTH:  1919/12/10  DATE OF PROCEDURE:  02/26/2014  PREOPERATIVE DIAGNOSES:  1.  Lymphoma.  2.  Difficulties with function of right jugular Port-A-Cath.   POSTOPERATIVE DIAGNOSES:  1.  Lymphoma.  2.  Difficulties with function of right jugular Port-A-Cath.   PROCEDURE: Injection of contrast through right jugular Port-A-Cath with fluoroscopy.   SURGEON: Algernon Huxley, MD.   ANESTHESIA:  None.    ESTIMATED BLOOD LOSS: None.   INDICATION FOR PROCEDURE: A 81 year old female with lymphoma. They are having difficulties with her port and we are asked to check this.   DESCRIPTION OF PROCEDURE: The patient is brought to the vascular suite. The right chest was sterilely prepped and draped and a sterile surgical field was created. Using a Huber needle the port was accessed without difficulty and blood did withdraw and saline was used to flush the port. 5 mL of contrast were then injected through the port which showed good flow through the port with the tip terminating in the superior vena cava just above the right atrium, with brisk flow through the port and through the central venous circulation. No significant stenosis or thrombus was seen. The Huber needle was then removed after the port was flushed with heparinized saline, and the patient was taken to the recovery room in stable condition, having tolerated the procedure well.   INTERPRETATION: Normal port check with no significant stenosis or thrombosis.    ____________________________ Algernon Huxley, MD jsd:bu D: 02/26/2014 15:12:00 ET T: 02/26/2014 17:20:47 ET JOB#: [id number]  cc: Algernon Huxley, MD, <Dictator> Algernon Huxley MD ELECTRONICALLY SIGNED 03/05/2014 14:07

## 2014-05-25 NOTE — Op Note (Signed)
PATIENT NAME:  Caroline Russo, Caroline Russo MR#:  [id number] DATE OF BIRTH:  [birth year]  DATE OF PROCEDURE:  02/10/2014  SURGEON: Christia Reading E. Genevive Bi, MD   ASSISTANT: None.   PREOPERATIVE DIAGNOSIS: Metastatic carcinoma.   POSTOPERATIVE DIAGNOSIS: Metastatic carcinoma.   OPERATION PERFORMED: Ultrasound-guided right internal jugular Port-A-Cath placement.   INDICATIONS FOR PROCEDURE: [name] is a 81 year old woman, who has been undergoing chemotherapy for metastatic carcinoma. She has recently experienced tremendous difficulty in obtaining IV access, and therefore, a Port-A-Cath was indicated for her to complete her chemotherapy. The indications and risks of the procedure were explained to the patient, who gave her informed consent.   DESCRIPTION OF PROCEDURE: The patient was brought to the operating suite and placed in the supine position. Laryngeal mask airway was established. The patient was prepped and draped in the usual sterile fashion. Using the ultrasound probe, the right internal jugular vein was percutaneously catheterized. A wire was inserted through the right side of the heart into the inferior vena cava. A port site was selected on the chest wall and was created in the prepectoral fascia. The catheter was tunneled from our port site up to the neck entrance site, and was then placed through a peel-away dilator sheath. The catheter was then positioned at the junction of the right atrium and superior vena cava. It had good blood return. The catheter was assembled after it had been trimmed appropriately. It was then secured to the anterior chest wall fascia with four 3-0 Prolene sutures on each of the quadrants of the Port-A-Cath. The Port-A-Cath was flushed and fluoroscopy was again carried out. There were no untoward complications, and the catheter appeared to be in good position. The wounds were then closed in multiple layers using running absorbable sutures and nylon for the skin.   The patient  tolerated the procedure well and was discharged to the recovery room after sterile dressings were applied.    ____________________________ Lew Dawes Genevive Bi, MD teo:MT D: 02/10/2014 16:01:08 ET T: 02/10/2014 16:29:46 ET JOB#: [id number]  cc: Christia Reading E. Genevive Bi, MD, <Dictator> Louis Matte MD ELECTRONICALLY SIGNED 02/12/2014 8:46

## 2014-05-28 ENCOUNTER — Telehealth: Payer: Self-pay | Admitting: *Deleted

## 2014-05-28 DIAGNOSIS — J90 Pleural effusion, not elsewhere classified: Secondary | ICD-10-CM | POA: Diagnosis not present

## 2014-05-28 DIAGNOSIS — R208 Other disturbances of skin sensation: Secondary | ICD-10-CM | POA: Diagnosis not present

## 2014-05-28 DIAGNOSIS — I10 Essential (primary) hypertension: Secondary | ICD-10-CM | POA: Diagnosis not present

## 2014-05-28 DIAGNOSIS — C8590 Non-Hodgkin lymphoma, unspecified, unspecified site: Secondary | ICD-10-CM

## 2014-05-28 DIAGNOSIS — R279 Unspecified lack of coordination: Secondary | ICD-10-CM | POA: Diagnosis not present

## 2014-05-28 DIAGNOSIS — C8232 Follicular lymphoma grade IIIa, intrathoracic lymph nodes: Secondary | ICD-10-CM | POA: Diagnosis not present

## 2014-05-28 DIAGNOSIS — R531 Weakness: Secondary | ICD-10-CM | POA: Diagnosis not present

## 2014-05-28 NOTE — Telephone Encounter (Signed)
Asking if Caroline Russo can come in tomorrow for a mag and potassium level check, she is sluggish adn not eating again

## 2014-05-28 NOTE — Telephone Encounter (Signed)
OK for lab check and schedule for possible mag / potassium infusion per [facility]  Order entered for scheduling

## 2014-05-29 ENCOUNTER — Other Ambulatory Visit: Payer: Self-pay | Admitting: *Deleted

## 2014-05-29 ENCOUNTER — Inpatient Hospital Stay: Payer: Medicare Other

## 2014-05-29 ENCOUNTER — Inpatient Hospital Stay: Payer: Medicare Other | Attending: Oncology

## 2014-05-29 DIAGNOSIS — I10 Essential (primary) hypertension: Secondary | ICD-10-CM | POA: Insufficient documentation

## 2014-05-29 DIAGNOSIS — R531 Weakness: Secondary | ICD-10-CM | POA: Insufficient documentation

## 2014-05-29 DIAGNOSIS — Z87891 Personal history of nicotine dependence: Secondary | ICD-10-CM | POA: Insufficient documentation

## 2014-05-29 DIAGNOSIS — E780 Pure hypercholesterolemia: Secondary | ICD-10-CM | POA: Insufficient documentation

## 2014-05-29 DIAGNOSIS — Z9181 History of falling: Secondary | ICD-10-CM | POA: Diagnosis not present

## 2014-05-29 DIAGNOSIS — R0609 Other forms of dyspnea: Secondary | ICD-10-CM | POA: Insufficient documentation

## 2014-05-29 DIAGNOSIS — C8290 Follicular lymphoma, unspecified, unspecified site: Secondary | ICD-10-CM | POA: Insufficient documentation

## 2014-05-29 DIAGNOSIS — E876 Hypokalemia: Secondary | ICD-10-CM | POA: Diagnosis not present

## 2014-05-29 DIAGNOSIS — Z79899 Other long term (current) drug therapy: Secondary | ICD-10-CM | POA: Insufficient documentation

## 2014-05-29 DIAGNOSIS — R5383 Other fatigue: Secondary | ICD-10-CM | POA: Insufficient documentation

## 2014-05-29 DIAGNOSIS — Z803 Family history of malignant neoplasm of breast: Secondary | ICD-10-CM | POA: Diagnosis not present

## 2014-05-29 DIAGNOSIS — C8590 Non-Hodgkin lymphoma, unspecified, unspecified site: Secondary | ICD-10-CM

## 2014-05-29 DIAGNOSIS — M25519 Pain in unspecified shoulder: Secondary | ICD-10-CM | POA: Insufficient documentation

## 2014-05-29 DIAGNOSIS — E119 Type 2 diabetes mellitus without complications: Secondary | ICD-10-CM | POA: Diagnosis not present

## 2014-05-29 DIAGNOSIS — Z801 Family history of malignant neoplasm of trachea, bronchus and lung: Secondary | ICD-10-CM | POA: Diagnosis not present

## 2014-05-29 LAB — MAGNESIUM: MAGNESIUM: 1.6 mg/dL — AB (ref 1.7–2.4)

## 2014-05-29 LAB — POTASSIUM: Potassium: 3.5 mmol/L (ref 3.5–5.1)

## 2014-05-29 MED ORDER — MAGNESIUM SULFATE 2 GM/50ML IV SOLN
INTRAVENOUS | Status: AC
  Filled 2014-05-29: qty 50

## 2014-05-29 MED ORDER — SODIUM CHLORIDE 0.9 % IJ SOLN
10.0000 mL | INTRAMUSCULAR | Status: DC | PRN
  Administered 2014-05-29: 10 mL via INTRAVENOUS
  Filled 2014-05-29: qty 10

## 2014-05-29 MED ORDER — MAGNESIUM SULFATE 2 GM/50ML IV SOLN
2.0000 g | Freq: Once | INTRAVENOUS | Status: AC
  Administered 2014-05-29: 2 g via INTRAVENOUS

## 2014-05-29 MED ORDER — HEPARIN SOD (PORK) LOCK FLUSH 100 UNIT/ML IV SOLN
500.0000 [IU] | Freq: Once | INTRAVENOUS | Status: AC
  Administered 2014-05-29: 500 [IU] via INTRAVENOUS
  Filled 2014-05-29: qty 5

## 2014-05-29 NOTE — Addendum Note (Signed)
Addended by: Jarrett Soho C on: 05/29/2014 10:30 AM   Modules accepted: Orders

## 2014-06-02 DIAGNOSIS — J90 Pleural effusion, not elsewhere classified: Secondary | ICD-10-CM | POA: Diagnosis not present

## 2014-06-02 DIAGNOSIS — R531 Weakness: Secondary | ICD-10-CM | POA: Diagnosis not present

## 2014-06-02 DIAGNOSIS — R279 Unspecified lack of coordination: Secondary | ICD-10-CM | POA: Diagnosis not present

## 2014-06-02 DIAGNOSIS — R208 Other disturbances of skin sensation: Secondary | ICD-10-CM | POA: Diagnosis not present

## 2014-06-02 DIAGNOSIS — C8232 Follicular lymphoma grade IIIa, intrathoracic lymph nodes: Secondary | ICD-10-CM | POA: Diagnosis not present

## 2014-06-02 DIAGNOSIS — I10 Essential (primary) hypertension: Secondary | ICD-10-CM | POA: Diagnosis not present

## 2014-06-03 DIAGNOSIS — J910 Malignant pleural effusion: Secondary | ICD-10-CM | POA: Diagnosis not present

## 2014-06-06 ENCOUNTER — Inpatient Hospital Stay: Payer: Medicare Other

## 2014-06-06 ENCOUNTER — Telehealth: Payer: Self-pay | Admitting: *Deleted

## 2014-06-06 DIAGNOSIS — C8290 Follicular lymphoma, unspecified, unspecified site: Secondary | ICD-10-CM | POA: Diagnosis not present

## 2014-06-06 DIAGNOSIS — E876 Hypokalemia: Secondary | ICD-10-CM | POA: Diagnosis not present

## 2014-06-06 DIAGNOSIS — Z79899 Other long term (current) drug therapy: Secondary | ICD-10-CM | POA: Diagnosis not present

## 2014-06-06 DIAGNOSIS — M25519 Pain in unspecified shoulder: Secondary | ICD-10-CM | POA: Diagnosis not present

## 2014-06-06 DIAGNOSIS — R531 Weakness: Secondary | ICD-10-CM | POA: Diagnosis not present

## 2014-06-06 LAB — MAGNESIUM: Magnesium: 1.3 mg/dL — ABNORMAL LOW (ref 1.7–2.4)

## 2014-06-06 MED ORDER — SODIUM CHLORIDE 0.9 % IV SOLN: 2.0000 g | Freq: Once | INTRAVENOUS | Status: DC

## 2014-06-06 MED ORDER — SODIUM CHLORIDE 0.9 % IJ SOLN
10.0000 mL | Freq: Once | INTRAMUSCULAR | Status: AC
  Administered 2014-06-06: 10 mL via INTRAVENOUS
  Filled 2014-06-06: qty 10

## 2014-06-06 MED ORDER — MAGNESIUM SULFATE 2 GM/50ML IV SOLN
2.0000 g | Freq: Once | INTRAVENOUS | Status: AC
  Administered 2014-06-06: 2 g via INTRAVENOUS
  Filled 2014-06-06: qty 50

## 2014-06-06 MED ORDER — HEPARIN SOD (PORK) LOCK FLUSH 100 UNIT/ML IV SOLN
500.0000 [IU] | Freq: Once | INTRAVENOUS | Status: AC
  Administered 2014-06-06: 500 [IU] via INTRAVENOUS
  Filled 2014-06-06: qty 5

## 2014-06-06 NOTE — Telephone Encounter (Signed)
Asking if she can come in for lab check today  OK per L Herring, AGNP-C, order entered. Spoke with Lora Paula, RN in chemo who said if she can get here this morning and needs mag, she can get it. Caroline Russo said she will bring her over now

## 2014-06-09 ENCOUNTER — Emergency Department
Admission: EM | Admit: 2014-06-09 | Discharge: 2014-06-09 | Disposition: A | Discharge status: Home / Self Care | Payer: Medicare Other | Attending: Emergency Medicine | Admitting: Emergency Medicine

## 2014-06-09 ENCOUNTER — Emergency Department: Payer: Medicare Other

## 2014-06-09 ENCOUNTER — Encounter: Payer: Self-pay | Admitting: Emergency Medicine

## 2014-06-09 ENCOUNTER — Other Ambulatory Visit: Payer: Self-pay

## 2014-06-09 DIAGNOSIS — I10 Essential (primary) hypertension: Secondary | ICD-10-CM | POA: Diagnosis not present

## 2014-06-09 DIAGNOSIS — S0990XA Unspecified injury of head, initial encounter: Secondary | ICD-10-CM | POA: Diagnosis present

## 2014-06-09 DIAGNOSIS — Y9389 Activity, other specified: Secondary | ICD-10-CM | POA: Insufficient documentation

## 2014-06-09 DIAGNOSIS — Z87891 Personal history of nicotine dependence: Secondary | ICD-10-CM | POA: Insufficient documentation

## 2014-06-09 DIAGNOSIS — S0511XA Contusion of eyeball and orbital tissues, right eye, initial encounter: Secondary | ICD-10-CM | POA: Insufficient documentation

## 2014-06-09 DIAGNOSIS — Z880 Allergy status to penicillin: Secondary | ICD-10-CM | POA: Insufficient documentation

## 2014-06-09 DIAGNOSIS — Z7951 Long term (current) use of inhaled steroids: Secondary | ICD-10-CM | POA: Insufficient documentation

## 2014-06-09 DIAGNOSIS — W01198A Fall on same level from slipping, tripping and stumbling with subsequent striking against other object, initial encounter: Secondary | ICD-10-CM | POA: Diagnosis not present

## 2014-06-09 DIAGNOSIS — S0083XA Contusion of other part of head, initial encounter: Secondary | ICD-10-CM | POA: Diagnosis not present

## 2014-06-09 DIAGNOSIS — E119 Type 2 diabetes mellitus without complications: Secondary | ICD-10-CM | POA: Diagnosis not present

## 2014-06-09 DIAGNOSIS — Y998 Other external cause status: Secondary | ICD-10-CM | POA: Insufficient documentation

## 2014-06-09 DIAGNOSIS — Y92091 Bathroom in other non-institutional residence as the place of occurrence of the external cause: Secondary | ICD-10-CM | POA: Diagnosis not present

## 2014-06-09 DIAGNOSIS — Z79899 Other long term (current) drug therapy: Secondary | ICD-10-CM | POA: Insufficient documentation

## 2014-06-09 LAB — CBC
HCT: 39.9 % (ref 35.0–47.0)
HEMOGLOBIN: 13 g/dL (ref 12.0–16.0)
MCH: 30 pg (ref 26.0–34.0)
MCHC: 32.7 g/dL (ref 32.0–36.0)
MCV: 91.9 fL (ref 80.0–100.0)
Platelets: 115 10*3/uL — ABNORMAL LOW (ref 150–440)
RBC: 4.34 MIL/uL (ref 3.80–5.20)
RDW: 15.1 % — ABNORMAL HIGH (ref 11.5–14.5)
WBC: 4.5 10*3/uL (ref 3.6–11.0)

## 2014-06-09 LAB — BASIC METABOLIC PANEL
Anion gap: 7 (ref 5–15)
BUN: 9 mg/dL (ref 6–20)
CHLORIDE: 102 mmol/L (ref 101–111)
CO2: 27 mmol/L (ref 22–32)
CREATININE: 0.75 mg/dL (ref 0.44–1.00)
Calcium: 8.3 mg/dL — ABNORMAL LOW (ref 8.9–10.3)
GFR calc Af Amer: >60 mL/min (ref >60)
GFR calc non Af Amer: >60 mL/min (ref >60)
Glucose, Bld: 149 mg/dL — ABNORMAL HIGH (ref 65–99)
Potassium: 3.4 mmol/L — ABNORMAL LOW (ref 3.5–5.1)
Sodium: 136 mmol/L (ref 135–145)

## 2014-06-09 NOTE — Discharge Instructions (Signed)
Head Injury  You have a head injury. Headaches and throwing up (vomiting) are common after a head injury. It should be easy to wake up from sleeping. Sometimes you must stay in the hospital. Most problems happen within the first 24 hours. Side effects may occur up to 7-10 days after the injury.   WHAT ARE THE TYPES OF HEAD INJURIES?  Head injuries can be as minor as a bump. Some head injuries can be more severe. More severe head injuries include:  · A jarring injury to the brain (concussion).  · A bruise of the brain (contusion). This mean there is bleeding in the brain that can cause swelling.  · A cracked skull (skull fracture).  · Bleeding in the brain that collects, clots, and forms a bump (hematoma).  WHEN SHOULD I GET HELP RIGHT AWAY?   · You are confused or sleepy.  · You cannot be woken up.  · You feel sick to your stomach (nauseous) or keep throwing up (vomiting).  · Your dizziness or unsteadiness is getting worse.  · You have very bad, lasting headaches that are not helped by medicine. Take medicines only as told by your doctor.  · You cannot use your arms or legs like normal.  · You cannot walk.  · You notice changes in the black spots in the center of the colored part of your eye (pupil).  · You have clear or bloody fluid coming from your nose or ears.  · You have trouble seeing.  During the next 24 hours after the injury, you must stay with someone who can watch you. This person should get help right away (call 911 in the [place] if you start to shake and are not able to control it (have seizures), you pass out, or you are unable to wake up.  HOW CAN I PREVENT A HEAD INJURY IN THE FUTURE?  · Wear seat belts.  · Wear a helmet while bike riding and playing sports like football.  · Stay away from dangerous activities around the house.  WHEN CAN I RETURN TO NORMAL ACTIVITIES AND ATHLETICS?  See your doctor before doing these activities. You should not do normal activities or play contact sports until 1 week  after the following symptoms have stopped:  · Headache that does not go away.  · Dizziness.  · Poor attention.  · Confusion.  · Memory problems.  · Sickness to your stomach or throwing up.  · Tiredness.  · Fussiness.  · Bothered by bright lights or loud noises.  · Anxiousness or depression.  · Restless sleep.  MAKE SURE YOU:   · Understand these instructions.  · Will watch your condition.  · Will get help right away if you are not doing well or get worse.  Document Released: 12/24/2007 Document Revised: 05/27/2013 Document Reviewed: 09/17/2012  [employer]® Patient Information ©2015 [employer]. This information is not intended to replace advice given to you by your health care provider. Make sure you discuss any questions you have with your health care provider.

## 2014-06-09 NOTE — ED Provider Notes (Signed)
[facility] Emergency Department Provider Note  ____________________________________________  Time seen: Approximately 3:05 PM  I have reviewed the triage vital signs and the nursing notes.   HISTORY  Chief Complaint Fall    HPI Caroline Russo is a 81 y.o. female who tripped while using the bathroom last night. She fell and hit her head on the floor. Her daughter was able to assist her up. Today during physical therapy, they recommended she come to get evaluated because of the bruising around her eye.  She denies other injury. She denies fever or other weakness. She has been generally weak for 1 year, but this is chronic and unchanged.  There was no loss of consciousness she does not take any anticoagulants. She denies neck pain, she does have some soreness along the left side of the base of her skull but no pain in the middle of her neck.  No numbness or tingling. Does not use alcohol.  She does note a bit of an achy throbbing pain over the right side of her head. And a mild headache.   Past Medical History  Diagnosis Date  . HTN (hypertension)   . DM2 (diabetes mellitus, type 2)     insulin requiring  . Hypercholesterolemia   . Murmur     hx  . Tinnitus     chronic  . Macular degeneration   . Chronic chest pain     a. nonobs cath 2002.  Marland Kitchen Palpitations     a. 01/2011 Holter monitor showed frequent PACs, sinus bradycardia  . Non Hodgkin's lymphoma   . Anxiety   . Depression   . Irregular cardiac rhythm   . Heart murmur   . Cataract     Patient Active Problem List   Diagnosis Date Noted  . Lymphoma 12/12/2013  . Pleural effusion on right 06/19/2013  . Headache 06/18/2013  . Shortness of breath 06/18/2013  . Malaise 06/18/2013  . Skin irritation 02/08/2013  . PAC (premature atrial contraction) 12/04/2012  . Macular degeneration 09/03/2012  . Orthostatic hypotension 08/20/2012  . Palpitations   . Diabetes mellitus 09/16/2011  . Back  pain 09/16/2011  . CHEST PAIN-UNSPECIFIED 01/13/2010  . HYPERLIPIDEMIA-MIXED 09/16/2009  . ESSENTIAL HYPERTENSION, BENIGN 09/16/2009  . CARDIAC MURMUR 09/16/2009    Past Surgical History  Procedure Laterality Date  . Cardiac catheterization  2002  . Tumor removal  2012    benign neck tumor removed  . Tooth extraction    . Knee surgery Left   . Ankle surgery Left   . Partial hysterectomy    . Insertion / placement pleural catheter      Current Outpatient Rx  Name  Route  Sig  Dispense  Refill  . ALPRAZolam (XANAX) 0.25 MG tablet   Oral   Take 0.25 mg by mouth every 6 (six) hours as needed.          . benzonatate (TESSALON) 100 MG capsule   Oral   Take 100 mg by mouth 3 (three) times daily as needed.       0   . cyclobenzaprine (FLEXERIL) 10 MG tablet   Oral   Take 10 mg by mouth 2 (two) times daily as needed for muscle spasms.         Marland Kitchen docusate sodium (COLACE) 100 MG capsule   Oral   Take 100 mg by mouth 2 (two) times daily.      3   . fluticasone (FLONASE) 50 MCG/ACT nasal spray  Each Nare   Place 1 spray into both nostrils daily.         Marland Kitchen lactulose (CHRONULAC) 10 GM/15ML solution      10 g 2 (two) times daily as needed.       0   . loratadine (CLARITIN) 10 MG tablet   Oral   Take 10 mg by mouth daily.         . meclizine (ANTIVERT) 12.5 MG tablet   Oral   Take 12.5 mg by mouth 3 (three) times daily as needed.         . metoCLOPramide (REGLAN) 10 MG tablet   Oral   Take 10 mg by mouth 4 (four) times daily.         . Multiple Vitamin (MULTIVITAMIN WITH MINERALS) TABS tablet   Oral   Take 1 tablet by mouth daily.         . nitroGLYCERIN (NITROSTAT) 0.4 MG SL tablet   Sublingual   Place 0.4 mg under the tongue every 5 (five) minutes as needed.           . polyethylene glycol powder (GLYCOLAX/MIRALAX) powder   Oral   Take 17 g by mouth daily as needed.       3   . losartan-hydrochlorothiazide (HYZAAR) 100-12.5 MG per tablet    Oral   Take 1 tablet by mouth daily. Patient not taking: Reported on 06/09/2014   90 tablet   3   . metoprolol succinate (TOPROL-XL) 25 MG 24 hr tablet   Oral   Take 1 tablet (25 mg total) by mouth daily. Patient taking differently: Take 25 mg by mouth 2 (two) times daily.    90 tablet   3   . pantoprazole (PROTONIX) 40 MG tablet   Oral   Take 1 tablet (40 mg total) by mouth daily. Patient taking differently: Take 40 mg by mouth at bedtime.    90 tablet   3     Allergies Moxifloxacin; Codeine; Penicillins; Prednisone; and Unasyn  Family History  Problem Relation Age of Onset  . Heart disease Mother   . Lung cancer Brother     smoked  . Bladder Cancer Brother   . Stomach cancer Brother     Social History History  Substance Use Topics  . Smoking status: Former Smoker -- 0.25 packs/day for 7 years    Types: Cigarettes    Quit date: 01/25/1988  . Smokeless tobacco: Never Used  . Alcohol Use: No    Review of Systems Constitutional: No fever/chills Eyes: No visual changes. There is no eye pain. She has had 2 cataract surgeries previous. ENT: No sore throat. Cardiovascular: Denies chest pain. Respiratory: Denies shortness of breath. Gastrointestinal: No abdominal pain.  No nausea, no vomiting.  No diarrhea.  No constipation. Genitourinary: Negative for dysuria. No burning or increased frequency. Musculoskeletal: Negative for back pain. She feels sore over the right side of face. Skin: Negative for rash. Neurological: Negative for headaches, focal weakness or numbness.  10-point ROS otherwise negative.  ____________________________________________   PHYSICAL EXAM:  VITAL SIGNS: ED Triage Vitals  Enc Vitals Group     BP 06/09/14 1305 153/46 mmHg     Pulse Rate 06/09/14 1305 62     Resp 06/09/14 1305 18     Temp 06/09/14 1305 98.5 F (36.9 C)     Temp Source 06/09/14 1305 Oral     SpO2 06/09/14 1305 98 %     Weight 06/09/14 1305 130  lb (58.968 kg)      Height 06/09/14 1305 5\' 2"  (1.575 m)     Head Cir --      Peak Flow --      Pain Score 06/09/14 1306 10     Pain Loc --      Pain Edu? --      Excl. in [place]? --     Constitutional: Alert and oriented. Well appearing and in no acute distress. Eyes: Conjunctivae are normal. PERRL. EOMI. Head: Atraumatic except for ecchymosis over the right inferior orbit. There is no extraocular entrapment. There is no proptosis. Conjunctiva appears normal. There is no midline cervical tenderness. There is some tenderness at the base of the left occiput, but no midline pain. No step-offs. No deformity. Nose: No congestion/rhinnorhea. Mouth/Throat: Mucous membranes are moist.  Oropharynx non-erythematous. Neck: No stridor.   Cardiovascular: Normal rate, slightly irregular rhythm. Grossly normal heart sounds.  Good peripheral circulation. Respiratory: Normal respiratory effort.  No retractions. Lungs CTAB. Gastrointestinal: Soft and nontender. No distention. No abdominal bruits. No CVA tenderness. Musculoskeletal: No lower extremity tenderness nor edema.  No joint effusions. Neurologic:  Normal speech and language. No gross focal neurologic deficits are appreciated. Speech is normal. No gait instability. She moves all extremities well to command. There is no focal weakness. Skin:  Skin is warm, dry and intact. No rash noted. Psychiatric: Mood and affect are normal. Speech and behavior are normal.  ____________________________________________   LABS (all labs ordered are listed, but only abnormal results are displayed)  Labs Reviewed  CBC - Abnormal; Notable for the following:    RDW 15.1 (*)    Platelets 115 (*)    All other components within normal limits  BASIC METABOLIC PANEL - Abnormal; Notable for the following:    Potassium 3.4 (*)    Glucose, Bld 149 (*)    Calcium 8.3 (*)    All other components within normal limits   ____________________________________________  EKG  Normal sinus rhythm  with frequent PACs. This does not appear to be atrial fibrillation. I disagree with the computer read. Ventricular rate 62 QRS 78 QTc is 501. Normal axis. No acute ischemic changes. ____________________________________________  RADIOLOGY  IMPRESSION: No acute intracranial abnormality.  Atrophy and chronic microvascular ischemic change.  Right greater than left mastoid effusions and mucosal thickening left sphenoid sinus are Caroline since the most recent head CT.  ____________________________________________   PROCEDURES  Procedure(s) performed: None  Critical Care performed: No  ____________________________________________   INITIAL IMPRESSION / ASSESSMENT AND PLAN / ED COURSE  Pertinent labs & imaging results that were available during my care of the patient were reviewed by me and considered in my medical decision making (see chart for details).  Mechanical fall. Head injury. Bruising around the right orbit. Mild headache. Tympanic membranes normal. No hemotympanum.  Patient and otherwise normal state of health. Appears consistent with contusion of the lower right eye. No evidence of fracture.  We'll discharge to home. Patient lives with her daughter. She has a primary care whom she can follow-up with. Head injury and return precautions discussed. ____________________________________________   FINAL CLINICAL IMPRESSION(S) / ED DIAGNOSES  Final diagnoses:  Contusion of orbit, right, initial encounter      Delman Kitten, MD 06/09/14 1530

## 2014-06-09 NOTE — ED Notes (Signed)
Pt to ed with c/o fall yesterday while at home in bathroom.  Pt states she does not remember why she fell but had just used the bathroom and was unable to get up on her own. Pt with bruising to right eye and face.

## 2014-06-09 NOTE — ED Notes (Signed)
Patient receives physical therapy at home; pt had cancer and Pt is used to help regain strength.

## 2014-06-10 ENCOUNTER — Other Ambulatory Visit: Payer: Self-pay | Admitting: *Deleted

## 2014-06-10 DIAGNOSIS — R279 Unspecified lack of coordination: Secondary | ICD-10-CM | POA: Diagnosis not present

## 2014-06-10 DIAGNOSIS — R531 Weakness: Secondary | ICD-10-CM | POA: Diagnosis not present

## 2014-06-10 DIAGNOSIS — I10 Essential (primary) hypertension: Secondary | ICD-10-CM | POA: Diagnosis not present

## 2014-06-10 DIAGNOSIS — C8232 Follicular lymphoma grade IIIa, intrathoracic lymph nodes: Secondary | ICD-10-CM | POA: Diagnosis not present

## 2014-06-10 DIAGNOSIS — C8590 Non-Hodgkin lymphoma, unspecified, unspecified site: Secondary | ICD-10-CM

## 2014-06-10 DIAGNOSIS — R208 Other disturbances of skin sensation: Secondary | ICD-10-CM | POA: Diagnosis not present

## 2014-06-10 DIAGNOSIS — J90 Pleural effusion, not elsewhere classified: Secondary | ICD-10-CM | POA: Diagnosis not present

## 2014-06-12 ENCOUNTER — Inpatient Hospital Stay (HOSPITAL_BASED_OUTPATIENT_CLINIC_OR_DEPARTMENT_OTHER): Payer: Medicare Other | Admitting: Oncology

## 2014-06-12 ENCOUNTER — Inpatient Hospital Stay: Payer: Medicare Other

## 2014-06-12 VITALS — BP 119/71 | HR 52 | Wt 130.5 lb

## 2014-06-12 DIAGNOSIS — C8290 Follicular lymphoma, unspecified, unspecified site: Secondary | ICD-10-CM | POA: Diagnosis not present

## 2014-06-12 DIAGNOSIS — C8590 Non-Hodgkin lymphoma, unspecified, unspecified site: Secondary | ICD-10-CM

## 2014-06-12 DIAGNOSIS — R531 Weakness: Secondary | ICD-10-CM | POA: Diagnosis not present

## 2014-06-12 DIAGNOSIS — E876 Hypokalemia: Secondary | ICD-10-CM

## 2014-06-12 DIAGNOSIS — Z801 Family history of malignant neoplasm of trachea, bronchus and lung: Secondary | ICD-10-CM

## 2014-06-12 DIAGNOSIS — M25519 Pain in unspecified shoulder: Secondary | ICD-10-CM | POA: Diagnosis not present

## 2014-06-12 DIAGNOSIS — R5383 Other fatigue: Secondary | ICD-10-CM

## 2014-06-12 DIAGNOSIS — I10 Essential (primary) hypertension: Secondary | ICD-10-CM

## 2014-06-12 DIAGNOSIS — E780 Pure hypercholesterolemia: Secondary | ICD-10-CM

## 2014-06-12 DIAGNOSIS — Z79899 Other long term (current) drug therapy: Secondary | ICD-10-CM | POA: Diagnosis not present

## 2014-06-12 DIAGNOSIS — Z803 Family history of malignant neoplasm of breast: Secondary | ICD-10-CM

## 2014-06-12 DIAGNOSIS — E119 Type 2 diabetes mellitus without complications: Secondary | ICD-10-CM

## 2014-06-12 DIAGNOSIS — Z9181 History of falling: Secondary | ICD-10-CM

## 2014-06-12 DIAGNOSIS — R0609 Other forms of dyspnea: Secondary | ICD-10-CM

## 2014-06-12 LAB — CBC WITH DIFFERENTIAL/PLATELET
Basophils Absolute: 0.1 10*3/uL (ref 0–0.1)
Basophils Relative: 1 %
Eosinophils Absolute: 0.1 10*3/uL (ref 0–0.7)
Eosinophils Relative: 2 %
HCT: 38.8 % (ref 35.0–47.0)
Hemoglobin: 12.8 g/dL (ref 12.0–16.0)
LYMPHS ABS: 2 10*3/uL (ref 1.0–3.6)
LYMPHS PCT: 50 %
MCH: 29.9 pg (ref 26.0–34.0)
MCHC: 33 g/dL (ref 32.0–36.0)
MCV: 90.7 fL (ref 80.0–100.0)
Monocytes Absolute: 0.4 10*3/uL (ref 0.2–0.9)
Monocytes Relative: 11 %
NEUTROS ABS: 1.4 10*3/uL (ref 1.4–6.5)
NEUTROS PCT: 40 %
PLATELETS: 109 10*3/uL — AB (ref 150–440)
RBC: 4.28 MIL/uL (ref 3.80–5.20)
RDW: 14.9 % — ABNORMAL HIGH (ref 11.5–14.5)
WBC: 4 10*3/uL (ref 3.6–11.0)

## 2014-06-12 LAB — COMPREHENSIVE METABOLIC PANEL
ALT: 14 U/L (ref 14–54)
AST: 29 U/L (ref 15–41)
Albumin: 3.1 g/dL — ABNORMAL LOW (ref 3.5–5.0)
Alkaline Phosphatase: 104 U/L (ref 38–126)
Anion gap: 9 (ref 5–15)
BILIRUBIN TOTAL: 0.5 mg/dL (ref 0.3–1.2)
BUN: 10 mg/dL (ref 6–20)
CO2: 28 mmol/L (ref 22–32)
Calcium: 8.2 mg/dL — ABNORMAL LOW (ref 8.9–10.3)
Chloride: 101 mmol/L (ref 101–111)
Creatinine, Ser: 0.72 mg/dL (ref 0.44–1.00)
GFR calc Af Amer: >60 mL/min (ref >60)
GFR calc non Af Amer: >60 mL/min (ref >60)
Glucose, Bld: 152 mg/dL — ABNORMAL HIGH (ref 65–99)
Potassium: 3.3 mmol/L — ABNORMAL LOW (ref 3.5–5.1)
SODIUM: 138 mmol/L (ref 135–145)
Total Protein: 5.6 g/dL — ABNORMAL LOW (ref 6.5–8.1)

## 2014-06-12 LAB — MAGNESIUM: MAGNESIUM: 1.3 mg/dL — AB (ref 1.7–2.4)

## 2014-06-12 MED ORDER — MAGNESIUM CHLORIDE 64 MG PO TBEC: 1.0000 | DELAYED_RELEASE_TABLET | Freq: Two times a day (BID) | ORAL | Status: DC

## 2014-06-12 MED ORDER — POTASSIUM CHLORIDE 20 MEQ PO PACK: 20.0000 meq | PACK | Freq: Every day | ORAL | Status: DC

## 2014-06-13 ENCOUNTER — Other Ambulatory Visit: Payer: Self-pay | Admitting: Family Medicine

## 2014-06-13 ENCOUNTER — Ambulatory Visit (INDEPENDENT_AMBULATORY_CARE_PROVIDER_SITE_OTHER): Payer: Medicare Other | Admitting: Cardiovascular Disease

## 2014-06-13 ENCOUNTER — Encounter: Payer: Self-pay | Admitting: Oncology

## 2014-06-13 ENCOUNTER — Inpatient Hospital Stay: Payer: Medicare Other

## 2014-06-13 ENCOUNTER — Encounter: Payer: Self-pay | Admitting: Family Medicine

## 2014-06-13 ENCOUNTER — Telehealth: Payer: Self-pay | Admitting: *Deleted

## 2014-06-13 ENCOUNTER — Encounter: Payer: Self-pay | Admitting: Cardiovascular Disease

## 2014-06-13 VITALS — BP 160/60 | HR 56 | Ht 62.0 in | Wt 130.0 lb

## 2014-06-13 DIAGNOSIS — I491 Atrial premature depolarization: Secondary | ICD-10-CM

## 2014-06-13 DIAGNOSIS — M25519 Pain in unspecified shoulder: Secondary | ICD-10-CM | POA: Diagnosis not present

## 2014-06-13 DIAGNOSIS — R002 Palpitations: Secondary | ICD-10-CM

## 2014-06-13 DIAGNOSIS — C8590 Non-Hodgkin lymphoma, unspecified, unspecified site: Secondary | ICD-10-CM

## 2014-06-13 DIAGNOSIS — J948 Other specified pleural conditions: Secondary | ICD-10-CM

## 2014-06-13 DIAGNOSIS — J90 Pleural effusion, not elsewhere classified: Secondary | ICD-10-CM

## 2014-06-13 DIAGNOSIS — R531 Weakness: Secondary | ICD-10-CM | POA: Diagnosis not present

## 2014-06-13 DIAGNOSIS — C8290 Follicular lymphoma, unspecified, unspecified site: Secondary | ICD-10-CM | POA: Diagnosis not present

## 2014-06-13 DIAGNOSIS — E876 Hypokalemia: Secondary | ICD-10-CM | POA: Diagnosis not present

## 2014-06-13 DIAGNOSIS — I10 Essential (primary) hypertension: Secondary | ICD-10-CM | POA: Diagnosis not present

## 2014-06-13 DIAGNOSIS — R001 Bradycardia, unspecified: Secondary | ICD-10-CM

## 2014-06-13 DIAGNOSIS — Z79899 Other long term (current) drug therapy: Secondary | ICD-10-CM | POA: Diagnosis not present

## 2014-06-13 HISTORY — DX: Hypomagnesemia: E83.42

## 2014-06-13 MED ORDER — MAGNESIUM SULFATE 4 GM/100ML IV SOLN
4.0000 g | Freq: Once | INTRAVENOUS | Status: AC
  Administered 2014-06-13: 4 g via INTRAVENOUS

## 2014-06-13 MED ORDER — SODIUM CHLORIDE 0.9 % IV SOLN: 4.0000 g | Freq: Once | INTRAVENOUS | Status: DC

## 2014-06-13 MED ORDER — HEPARIN SOD (PORK) LOCK FLUSH 100 UNIT/ML IV SOLN
500.0000 [IU] | Freq: Once | INTRAVENOUS | Status: AC
  Administered 2014-06-13: 500 [IU] via INTRAVENOUS

## 2014-06-13 MED ORDER — LOSARTAN POTASSIUM 100 MG PO TABS: 100.0000 mg | ORAL_TABLET | Freq: Every day | ORAL | Status: DC

## 2014-06-13 MED ORDER — MAGNESIUM SULFATE 4 GM/100ML IV SOLN: 4.0000 g | Freq: Once | INTRAVENOUS | Status: DC

## 2014-06-13 MED ORDER — SODIUM CHLORIDE 0.9 % IJ SOLN
10.0000 mL | INTRAMUSCULAR | Status: DC | PRN
  Administered 2014-06-13: 10 mL via INTRAVENOUS
  Filled 2014-06-13: qty 10

## 2014-06-13 NOTE — Assessment & Plan Note (Signed)
Blood pressure is elevated today. We will restart losartan. Family reports this was held We will change the losartan HCTZ to losartan 100 mg daily alone given her low potassium level and her need for supplementation We'll stop the metoprolol given her bradycardia Suggested the family monitor her blood pressure at home and call us if this runs high

## 2014-06-13 NOTE — Telephone Encounter (Signed)
Opened in error

## 2014-06-13 NOTE — Assessment & Plan Note (Signed)
Minimal pain using her catheter twice per week with 3-400 removed each time per the patient

## 2014-06-13 NOTE — Patient Instructions (Addendum)
Please hold the metoprolol (this can slow heart rate and cause fatigue)  Start losartan one a day  (new script with no diuretic, no HCTZ.  This can drop potassium)  Please monitor blood pressure If blood pressure runs high (consistently >150), goal 110 to 140s) Call the office  Please call us if you have new issues that need to be addressed before your next appt.  Your physician wants you to follow-up in: 6 months.  You will receive a reminder letter in the mail two months in advance. If you don't receive a letter, please call our office to schedule the follow-up appointment.

## 2014-06-13 NOTE — Assessment & Plan Note (Signed)
She reports that chemotherapy has been stopped. She has lost significant weight, very weak. Recent fall with trauma to her right eye

## 2014-06-13 NOTE — Assessment & Plan Note (Signed)
Unclear if this is contributing to any of her fatigue symptoms. We will hold the metoprolol

## 2014-06-13 NOTE — Progress Notes (Signed)
[place] @ [facility] [phone]  Fax:([phone]) [place]: 10-31-1919  MR#: [id number]  GYB#:[id number]  Patient Care Team: Arlis Porta., MD as PCP - General (Unknown Physician Specialty)  CHIEF COMPLAINT:  Chief Complaint  Patient presents with  . Follow-up    Oncology History   1. Biopsy of para-aortic lymph node is consistent with follicular lymphoma, clinically stage as 3A Diagnosis in July of 2015. Started on rituximab on July 31, 2013 2. Started on [place]  and Rituxan on November 04, 2013 3 maintenance rituximab started from April of 2016      Lymphoma   12/12/2013 Initial Diagnosis Lymphoma    No flowsheet data found.  INTERVAL HISTORY: 81 year old lady with a history of follicular lymphoma recently fell down.  Patient did not lose any consciousness there was no history of syncopal attack or seizure activity.  Continues to feel weak.  Remains hypokalemic and hypomagnesemic.  Patient does not tolerate oral mag oxide.  Slow-Mag would be started.  Potassium supplements been planned.  No chills.  No fever.  No nausea.  REVIEW OF SYSTEMS:   Gen. status.  Close patient continues to go weak and tired.  Frequent fall.  HEENT: Head echymosis on the face following fall Lungs: Shortness of breath on exertion.  Cardiac: No chest pain.  Skin: No rash.  Multiple ecchymotic or area\neurological system: No headache.  No dizziness.  No other focal symptoms.  No lower extremity edema.  Abdomen: No abdominal pain.  No nausea.  No vomiting.  No diarrhea.  No rectal bleeding.. All other systems have been reviewed and reported to be negative  As per HPI. Otherwise, a complete review of systems is negatve.  PAST MEDICAL HISTORY: Past Medical History  Diagnosis Date  . HTN (hypertension)   . DM2 (diabetes mellitus, type 2)     insulin requiring  . Hypercholesterolemia   . Murmur     hx  . Tinnitus     chronic  . Macular degeneration   . Chronic  chest pain     a. nonobs cath 2002.  Marland Kitchen Palpitations     a. 01/2011 Holter monitor showed frequent PACs, sinus bradycardia  . Anxiety   . Depression   . Irregular cardiac rhythm   . Heart murmur   . Cataract   . Non Hodgkin's lymphoma   . Hypomagnesemia 06/13/2014    PAST SURGICAL HISTORY: Past Surgical History  Procedure Laterality Date  . Cardiac catheterization  2002  . Tumor removal  2012    benign neck tumor removed  . Tooth extraction    . Knee surgery Left   . Ankle surgery Left   . Partial hysterectomy    . Insertion / placement pleural catheter      FAMILY HISTORY Family History  Problem Relation Age of Onset  . Heart disease Mother   . Lung cancer Brother     smoked  . Bladder Cancer Brother   . Stomach cancer Brother     GYNECOLOGIC HISTORY:  No LMP recorded. Patient has had a hysterectomy.     ADVANCED DIRECTIVES:    HEALTH MAINTENANCE: History  Substance Use Topics  . Smoking status: Former Smoker -- 0.25 packs/day for 7 years    Types: Cigarettes    Quit date: 01/25/1988  . Smokeless tobacco: Never Used  . Alcohol Use: No     Colonoscopy:  PAP:  Bone density:  Lipid panel:  Allergies  Allergen Reactions  . Moxifloxacin Shortness Of Breath    hallucinations  . Codeine Other (See Comments)    unknown  . Penicillins     Itching  Rash   . Prednisone     Hallucinations  . Unasyn [Ampicillin-Sulbactam Sodium] Other (See Comments)    unknown    Current Outpatient Prescriptions  Medication Sig Dispense Refill  . ALPRAZolam (XANAX) 0.25 MG tablet Take 0.25 mg by mouth every 6 (six) hours as needed.     . benzonatate (TESSALON) 100 MG capsule Take 100 mg by mouth 3 (three) times daily as needed.   0  . cyclobenzaprine (FLEXERIL) 10 MG tablet Take 10 mg by mouth 2 (two) times daily as needed for muscle spasms.    Marland Kitchen docusate sodium (COLACE) 100 MG capsule Take 100 mg by mouth 2 (two) times daily.  3  . fluticasone (FLONASE) 50 MCG/ACT  nasal spray Place 1 spray into both nostrils daily.    Marland Kitchen lactulose (CHRONULAC) 10 GM/15ML solution 10 g 2 (two) times daily as needed.   0  . loratadine (CLARITIN) 10 MG tablet Take 10 mg by mouth daily.    . meclizine (ANTIVERT) 12.5 MG tablet Take 12.5 mg by mouth 3 (three) times daily as needed.    . metoCLOPramide (REGLAN) 10 MG tablet Take 10 mg by mouth 4 (four) times daily.    . Multiple Vitamin (MULTIVITAMIN WITH MINERALS) TABS tablet Take 1 tablet by mouth daily.    . nitroGLYCERIN (NITROSTAT) 0.4 MG SL tablet Place 0.4 mg under the tongue every 5 (five) minutes as needed.      . pantoprazole (PROTONIX) 40 MG tablet Take 1 tablet (40 mg total) by mouth daily. (Patient taking differently: Take 40 mg by mouth at bedtime. ) 90 tablet 3  . polyethylene glycol powder (GLYCOLAX/MIRALAX) powder Take 17 g by mouth daily as needed.   3  . losartan (COZAAR) 100 MG tablet Take 1 tablet (100 mg total) by mouth daily. 90 tablet 3  . magnesium chloride (SLOW-MAG) 64 MG TBEC SR tablet Take 1 tablet (64 mg total) by mouth 2 (two) times daily. 60 tablet 3  . potassium chloride (KLOR-CON) 20 MEQ packet Take 20 mEq by mouth daily. 30 packet 3   No current facility-administered medications for this visit.   Facility-Administered Medications Ordered in Other Visits  Medication Dose Route Frequency Provider Last Rate Last Dose  . sodium chloride 0.9 % injection 10 mL  10 mL Intravenous PRN Forest Gleason, MD   10 mL at 06/13/14 1335    OBJECTIVE:  Filed Vitals:   06/12/14 1406  BP: 119/71  Pulse: 52     Body mass index is 23.86 kg/(m^2).    ECOG FS:2 - Symptomatic, <50% confined to bed  PHYSICAL EXAM: Gen. status: Patient is in wheelchair.  Lymphatic system: No palpable supraclavicular cervical axillary inguinal adenopathy.  Lungs: Diminished air entry on both sides.  No rhonchi.  No rales.  Patient does have Pleurx catheter still draining well  Cardiac: Normal heart sounds.  Soft systolic  murmur.  Abdomen: Soft.  No palpable masses no tenderness no ascites liver and spleen not palpable. Lower extremity trace edema Skin: Multiple ecchymosis Neurological system: Higher functions within normal limit.  Cranial nerves are intact.  Motor and sensory systems within normal limit All other systems were examined HEENT: No abnormality  LAB RESULTS:  Appointment on 06/12/2014  Component Date Value Ref Range Status  . WBC 06/12/2014 4.0  3.6 -  11.0 K/uL Final  . RBC 06/12/2014 4.28  3.80 - 5.20 MIL/uL Final  . Hemoglobin 06/12/2014 12.8  12.0 - 16.0 g/dL Final  . HCT 06/12/2014 38.8  35.0 - 47.0 % Final  . MCV 06/12/2014 90.7  80.0 - 100.0 fL Final  . MCH 06/12/2014 29.9  26.0 - 34.0 pg Final  . MCHC 06/12/2014 33.0  32.0 - 36.0 g/dL Final  . RDW 06/12/2014 14.9* 11.5 - 14.5 % Final  . Platelets 06/12/2014 109* 150 - 440 K/uL Final  . Neutrophils Relative % 06/12/2014 40   Final  . Neutro Abs 06/12/2014 1.4  1.4 - 6.5 K/uL Final  . Lymphocytes Relative 06/12/2014 50   Final  . Lymphs Abs 06/12/2014 2.0  1.0 - 3.6 K/uL Final  . Monocytes Relative 06/12/2014 11   Final  . Monocytes Absolute 06/12/2014 0.4  0.2 - 0.9 K/uL Final  . Eosinophils Relative 06/12/2014 2   Final  . Eosinophils Absolute 06/12/2014 0.1  0 - 0.7 K/uL Final  . Basophils Relative 06/12/2014 1   Final  . Basophils Absolute 06/12/2014 0.1  0 - 0.1 K/uL Final  . Smear Review 06/12/2014 MORPHOLOGY UNREMARKABLE   Final   PLATELET CLUMPS NOTED ON SMEAR, COUNT APPEARS DECREASED  . Sodium 06/12/2014 138  135 - 145 mmol/L Final  . Potassium 06/12/2014 3.3* 3.5 - 5.1 mmol/L Final  . Chloride 06/12/2014 101  101 - 111 mmol/L Final  . CO2 06/12/2014 28  22 - 32 mmol/L Final  . Glucose, Bld 06/12/2014 152* 65 - 99 mg/dL Final  . BUN 06/12/2014 10  6 - 20 mg/dL Final  . Creatinine, Ser 06/12/2014 0.72  0.44 - 1.00 mg/dL Final  . Calcium 06/12/2014 8.2* 8.9 - 10.3 mg/dL Final  . Total Protein 06/12/2014 5.6* 6.5 - 8.1  g/dL Final  . Albumin 06/12/2014 3.1* 3.5 - 5.0 g/dL Final  . AST 06/12/2014 29  15 - 41 U/L Final  . ALT 06/12/2014 14  14 - 54 U/L Final  . Alkaline Phosphatase 06/12/2014 104  38 - 126 U/L Final  . Total Bilirubin 06/12/2014 0.5  0.3 - 1.2 mg/dL Final  . GFR calc non Af Amer 06/12/2014 >60  >60 mL/min Final  . GFR calc Af Amer 06/12/2014 >60  >60 mL/min Final   Comment: (NOTE) The eGFR has been calculated using the CKD EPI equation. This calculation has not been validated in all clinical situations. eGFR's persistently <60 mL/min signify possible Chronic Kidney Disease.   . Anion gap 06/12/2014 9  5 - 15 Final  . Magnesium 06/12/2014 1.3* 1.7 - 2.4 mg/dL Final   Comment: RESULT REPEATED AND VERIFIED KMR TO HAYLEY RHODE AT 1417 06/12/14 READ BACK PROFORMED     No results found for: LABCA2 Lab Results  Component Value Date   CA199 19 06/25/2013   Lab Results  Component Value Date   CEA 1.6 06/25/2013   No results found for: PSA Lab Results  Component Value Date   CA125 130.6* 12/30/2013     STUDIES: Ct Head Wo Contrast  06/09/2014   CLINICAL DATA:  Status post fall 06/08/2014. Bruising about the right eye and face.  EXAM: CT HEAD WITHOUT CONTRAST  TECHNIQUE: Contiguous axial images were obtained from the base of the skull through the vertex without intravenous contrast.  COMPARISON:  Head CT scan 08/11/2013 and 09/19/2007.  FINDINGS: Cortical atrophy chronic microvascular ischemic change are identified. No evidence of acute intracranial abnormality including hemorrhage, infarct, mass lesion,  mass effect, midline shift or abnormal extra-axial fluid collection is seen. There is no hydrocephalus or pneumocephalus.  The patient has bilateral mastoid effusions, larger on the right, which are new since the prior exams. A small amount of frothy secretions are seen in the left sphenoid sinus. No fracture is identified.  IMPRESSION: No acute intracranial abnormality.  Atrophy and  chronic microvascular ischemic change.  Right greater than left mastoid effusions and mucosal thickening left sphenoid sinus are new since the most recent head CT.   Electronically Signed   By: Inge Rise M.D.   On: 06/09/2014 13:39    ASSESSMENT: 81 year old lady with follicular lymphoma appears to be in remission on maintenance Rituxan therapy Hypokinemia Hypomagnesemia Poor tolerance to oral magnesium tablet  MEDICAL DECISION MAKING:  All lab data has been reviewed.  Will continue to supplement intravenous technetium.  Overall potassium has been started. Continue maintenance Rituxan Exact etiology of fall is not clear  Patient expressed understanding and was in agreement with this plan. She also understands that She can call clinic at any time with any questions, concerns, or complaints.    No matching staging information was found for the patient.  Forest Gleason, MD   06/13/2014 5:38 PM    .:  .

## 2014-06-13 NOTE — Assessment & Plan Note (Signed)
Asymptomatic APCs noted on EKG. We'll hold the beta blocker given her bradycardia

## 2014-06-13 NOTE — Progress Notes (Signed)
Patient ID: Caroline Russo, female    DOB: [birth year], 81 y.o.   MRN: [id number]  HPI Comments: Caroline Russo is a pleasant 82 year old woman with hypertension, nonobstructive coronary artery disease in 2002, diabetes, obesity who presents for routine followup of her coronary artery disease, hypertension  Previous episodes of dizziness with previous Blood pressure measurements showing orthostasis.  HCTZ was held in the past.  In follow-up today, she has been undergoing treatment for lymphoma She is weaker, using a wheelchair and walker to get around Recent fall while in the bathroom 06/09/2014, hit her right eye She reports that chemotherapy is done as recent scanning showed no progression to her lymphoma  She continues to have recurrent pleural effusion, with Pleurx catheter. Removes fluid twice per week. Weight continues to trend lower, previously 158 pounds, down to 150 pounds, now 130 pounds  Previously treated with Rituxan, then Treanda and Rituxan with steroids No significant lower extremity edema Family reports that losartan was recently held for bradycardia by oncology team  EKG shows normal sinus rhythm with rate 56 bpm, no significant ST or T-wave changes  Other past medical history In the hospital May 2015 CT scan showed moderate right pleural effusion, diffuse adenopathy CT scan of the head without metastases Hospital records from 11/11/2013 detail diagnosis of follicular lymphoma with chronic right-sided pleural effusion She was discharged from the hospital October 20, admission on October 17  Chronic problem with her vision. Hearing problem  She has macular degeneration.   Previous history of palpitations and event monitor was performed that showed occasional sinus bradycardia., Normal sinus rhythm mostly with frequent APCs. No other significant arrhythmia   Allergies  Allergen Reactions  . Moxifloxacin Shortness Of Breath    hallucinations  . Codeine Other (See  Comments)    unknown  . Penicillins     Itching  Rash   . Prednisone     Hallucinations  . Unasyn [Ampicillin-Sulbactam Sodium] Other (See Comments)    unknown    Current Outpatient Prescriptions on File Prior to Visit  Medication Sig Dispense Refill  . ALPRAZolam (XANAX) 0.25 MG tablet Take 0.25 mg by mouth every 6 (six) hours as needed.     . benzonatate (TESSALON) 100 MG capsule Take 100 mg by mouth 3 (three) times daily as needed.   0  . cyclobenzaprine (FLEXERIL) 10 MG tablet Take 10 mg by mouth 2 (two) times daily as needed for muscle spasms.    Marland Kitchen docusate sodium (COLACE) 100 MG capsule Take 100 mg by mouth 2 (two) times daily.  3  . fluticasone (FLONASE) 50 MCG/ACT nasal spray Place 1 spray into both nostrils daily.    Marland Kitchen lactulose (CHRONULAC) 10 GM/15ML solution 10 g 2 (two) times daily as needed.   0  . loratadine (CLARITIN) 10 MG tablet Take 10 mg by mouth daily.    . magnesium chloride (SLOW-MAG) 64 MG TBEC SR tablet Take 1 tablet (64 mg total) by mouth 2 (two) times daily. 60 tablet 3  . meclizine (ANTIVERT) 12.5 MG tablet Take 12.5 mg by mouth 3 (three) times daily as needed.    . metoCLOPramide (REGLAN) 10 MG tablet Take 10 mg by mouth 4 (four) times daily.    . Multiple Vitamin (MULTIVITAMIN WITH MINERALS) TABS tablet Take 1 tablet by mouth daily.    . nitroGLYCERIN (NITROSTAT) 0.4 MG SL tablet Place 0.4 mg under the tongue every 5 (five) minutes as needed.      . pantoprazole (PROTONIX) 40  MG tablet Take 1 tablet (40 mg total) by mouth daily. (Patient taking differently: Take 40 mg by mouth at bedtime. ) 90 tablet 3  . polyethylene glycol powder (GLYCOLAX/MIRALAX) powder Take 17 g by mouth daily as needed.   3  . potassium chloride (KLOR-CON) 20 MEQ packet Take 20 mEq by mouth daily. 30 packet 3   No current facility-administered medications on file prior to visit.    Past Medical History  Diagnosis Date  . HTN (hypertension)   . DM2 (diabetes mellitus, type 2)      insulin requiring  . Hypercholesterolemia   . Murmur     hx  . Tinnitus     chronic  . Macular degeneration   . Chronic chest pain     a. nonobs cath 2002.  Marland Kitchen Palpitations     a. 01/2011 Holter monitor showed frequent PACs, sinus bradycardia  . Anxiety   . Depression   . Irregular cardiac rhythm   . Heart murmur   . Cataract   . Non Hodgkin's lymphoma   . Hypomagnesemia 06/13/2014    Past Surgical History  Procedure Laterality Date  . Cardiac catheterization  2002  . Tumor removal  2012    benign neck tumor removed  . Tooth extraction    . Knee surgery Left   . Ankle surgery Left   . Partial hysterectomy    . Insertion / placement pleural catheter      Social History  reports that she quit smoking about 26 years ago. Her smoking use included Cigarettes. She has a 1.75 pack-year smoking history. She has never used smokeless tobacco. She reports that she does not drink alcohol or use illicit drugs.  Family History family history includes Bladder Cancer in her brother; Heart disease in her mother; Lung cancer in her brother; Stomach cancer in her brother.  Review of Systems  Constitutional: Positive for fatigue and unexpected weight change.  Respiratory: Negative.   Cardiovascular: Negative.   Gastrointestinal: Negative.   Musculoskeletal: Positive for gait problem.  Skin: Negative.   Allergic/Immunologic: Negative.   Neurological: Positive for weakness.  Psychiatric/Behavioral: Negative.   All other systems reviewed and are negative.   BP 160/60 mmHg  Pulse 56  Ht 5\' 2"  (1.575 m)  Wt 130 lb (58.968 kg)  BMI 23.77 kg/m2  Physical Exam  Constitutional: She is oriented to person, place, and time.  HENT:  Head: Normocephalic.  Nose: Nose normal.  Mouth/Throat: Oropharynx is clear and moist.  Eyes: Conjunctivae are normal. Pupils are equal, round, and reactive to light.  Neck: Normal range of motion. Neck supple. No JVD present.  Cardiovascular: Normal rate,  regular rhythm, S1 normal, S2 normal, normal heart sounds and intact distal pulses.  Exam reveals no gallop and no friction rub.   No murmur heard. Pulmonary/Chest: Effort normal and breath sounds normal. No respiratory distress. She has no wheezes. She has no rales. She exhibits no tenderness.  Abdominal: Soft. Bowel sounds are normal. She exhibits no distension. There is no tenderness.  Musculoskeletal: Normal range of motion. She exhibits no edema or tenderness.  Lymphadenopathy:    She has no cervical adenopathy.  Neurological: She is alert and oriented to person, place, and time. Coordination normal.  Skin: Skin is warm and dry. No rash noted. No erythema.  Ecchymoses around her right eye  Psychiatric: She has a normal mood and affect. Her behavior is normal. Judgment and thought content normal.    Assessment and Plan  Nursing note and vitals reviewed.

## 2014-06-19 ENCOUNTER — Inpatient Hospital Stay: Payer: Medicare Other

## 2014-06-19 ENCOUNTER — Inpatient Hospital Stay (HOSPITAL_BASED_OUTPATIENT_CLINIC_OR_DEPARTMENT_OTHER): Payer: Medicare Other | Admitting: Oncology

## 2014-06-19 VITALS — BP 150/74 | HR 83 | Temp 95.4°F | Wt 130.5 lb

## 2014-06-19 DIAGNOSIS — C8590 Non-Hodgkin lymphoma, unspecified, unspecified site: Secondary | ICD-10-CM

## 2014-06-19 DIAGNOSIS — Z803 Family history of malignant neoplasm of breast: Secondary | ICD-10-CM

## 2014-06-19 DIAGNOSIS — Z87891 Personal history of nicotine dependence: Secondary | ICD-10-CM

## 2014-06-19 DIAGNOSIS — M25519 Pain in unspecified shoulder: Secondary | ICD-10-CM

## 2014-06-19 DIAGNOSIS — E876 Hypokalemia: Secondary | ICD-10-CM | POA: Diagnosis not present

## 2014-06-19 DIAGNOSIS — C8290 Follicular lymphoma, unspecified, unspecified site: Secondary | ICD-10-CM

## 2014-06-19 DIAGNOSIS — E119 Type 2 diabetes mellitus without complications: Secondary | ICD-10-CM

## 2014-06-19 DIAGNOSIS — Z79899 Other long term (current) drug therapy: Secondary | ICD-10-CM

## 2014-06-19 DIAGNOSIS — R5383 Other fatigue: Secondary | ICD-10-CM

## 2014-06-19 DIAGNOSIS — R531 Weakness: Secondary | ICD-10-CM | POA: Diagnosis not present

## 2014-06-19 DIAGNOSIS — Z801 Family history of malignant neoplasm of trachea, bronchus and lung: Secondary | ICD-10-CM

## 2014-06-19 DIAGNOSIS — R0609 Other forms of dyspnea: Secondary | ICD-10-CM

## 2014-06-19 DIAGNOSIS — Z9181 History of falling: Secondary | ICD-10-CM

## 2014-06-19 DIAGNOSIS — E780 Pure hypercholesterolemia: Secondary | ICD-10-CM

## 2014-06-19 DIAGNOSIS — I10 Essential (primary) hypertension: Secondary | ICD-10-CM

## 2014-06-19 LAB — MAGNESIUM: Magnesium: 1.9 mg/dL (ref 1.7–2.4)

## 2014-06-19 LAB — POTASSIUM: Potassium: 3.7 mmol/L (ref 3.5–5.1)

## 2014-06-19 MED ORDER — KETOROLAC TROMETHAMINE 15 MG/ML IJ SOLN
15.0000 mg | Freq: Once | INTRAMUSCULAR | Status: AC
  Administered 2014-06-19: 15 mg via INTRAMUSCULAR
  Filled 2014-06-19: qty 1

## 2014-06-19 MED ORDER — KETOROLAC TROMETHAMINE 15 MG/ML IJ SOLN: 15.0000 mg | Freq: Once | INTRAMUSCULAR | Status: DC

## 2014-06-19 NOTE — Progress Notes (Signed)
Pt acute add on today for pain 8/10 between shoulder blades for past 4 days.  Pt former smoker.  Does have living will.

## 2014-06-20 ENCOUNTER — Encounter: Payer: Self-pay | Admitting: Oncology

## 2014-06-20 DIAGNOSIS — C8232 Follicular lymphoma grade IIIa, intrathoracic lymph nodes: Secondary | ICD-10-CM | POA: Diagnosis not present

## 2014-06-20 DIAGNOSIS — R279 Unspecified lack of coordination: Secondary | ICD-10-CM | POA: Diagnosis not present

## 2014-06-20 DIAGNOSIS — R531 Weakness: Secondary | ICD-10-CM | POA: Diagnosis not present

## 2014-06-20 DIAGNOSIS — R208 Other disturbances of skin sensation: Secondary | ICD-10-CM | POA: Diagnosis not present

## 2014-06-20 DIAGNOSIS — I10 Essential (primary) hypertension: Secondary | ICD-10-CM | POA: Diagnosis not present

## 2014-06-20 DIAGNOSIS — J90 Pleural effusion, not elsewhere classified: Secondary | ICD-10-CM | POA: Diagnosis not present

## 2014-06-20 NOTE — Progress Notes (Signed)
[place] @ [facility] Telephone:(336) [phone]  Fax:(336) [place]: 12-27-1919  MR#: [id number]  QAS#:[id number]  Patient Care Team: Arlis Porta., MD as PCP - General (Unknown Physician Specialty)  CHIEF COMPLAINT:  Chief Complaint  Patient presents with  . Acute Visit    Oncology History   1. Biopsy of para-aortic lymph node is consistent with follicular lymphoma, clinically stage as 3A Diagnosis in July of 2015. Started on rituximab on July 31, 2013 2. Started on [place]  and Rituxan on November 04, 2013 3 maintenance rituximab started from April of 2016      Lymphoma   12/12/2013 Initial Diagnosis Lymphoma    No flowsheet data found.  INTERVAL HISTORY: 81 year old lady with a history of follicular lymphoma recently fell down.  Patient did not lose any consciousness there was no history of syncopal attack or seizure activity.  Continues to feel weak.  Remains hypokalemic and hypomagnesemic.  Patient does not tolerate oral mag oxide.  Slow-Mag would be started.  Potassium supplements been planned.  No chills.  No fever.  No nausea. Jun 19, 2014 Patient came as an add on for evaluating intrascapular pain.  Pain is more when patient tries to cough.  No chills.  No fever.  REVIEW OF SYSTEMS:   Gen. status.  Close patient continues to go weak and tired.  Frequent fall.  HEENT: Head echymosis on the face following fall Lungs: Shortness of breath on exertion.  Cardiac: No chest pain.  Skin: No rash.  Multiple ecchymotic or area\neurological system: No headache.  No dizziness.  No other focal symptoms.  No lower extremity edema.  Abdomen: No abdominal pain.  No nausea.  No vomiting.  No diarrhea.  No rectal bleeding.. All other systems have been reviewed and reported to be negative patienr Intrascapular pain associated with breathing and coughing Patient has a Pleurx catheter and had 250 cc of pleural fluid removed As per HPI. Otherwise, a complete review  of systems is negatve.  PAST MEDICAL HISTORY: Past Medical History  Diagnosis Date  . HTN (hypertension)   . DM2 (diabetes mellitus, type 2)     insulin requiring  . Hypercholesterolemia   . Murmur     hx  . Tinnitus     chronic  . Macular degeneration   . Chronic chest pain     a. nonobs cath 2002.  Marland Kitchen Palpitations     a. 01/2011 Holter monitor showed frequent PACs, sinus bradycardia  . Anxiety   . Depression   . Irregular cardiac rhythm   . Heart murmur   . Cataract   . Non Hodgkin's lymphoma   . Hypomagnesemia 06/13/2014    PAST SURGICAL HISTORY: Past Surgical History  Procedure Laterality Date  . Cardiac catheterization  2002  . Tumor removal  2012    benign neck tumor removed  . Tooth extraction    . Knee surgery Left   . Ankle surgery Left   . Partial hysterectomy    . Insertion / placement pleural catheter      FAMILY HISTORY Family History  Problem Relation Age of Onset  . Heart disease Mother   . Lung cancer Brother     smoked  . Bladder Cancer Brother   . Stomach cancer Brother     GYNECOLOGIC HISTORY:  No LMP recorded. Patient has had a hysterectomy.     ADVANCED DIRECTIVES:    HEALTH MAINTENANCE: History  Substance Use Topics  .  Smoking status: Former Smoker -- 0.25 packs/day for 7 years    Types: Cigarettes    Quit date: 01/25/1988  . Smokeless tobacco: Never Used  . Alcohol Use: No       Allergies  Allergen Reactions  . Moxifloxacin Shortness Of Breath    hallucinations  . Codeine Other (See Comments)    unknown  . Penicillins     Itching  Rash   . Prednisone     Hallucinations  . Unasyn [Ampicillin-Sulbactam Sodium] Other (See Comments)    unknown    Current Outpatient Prescriptions  Medication Sig Dispense Refill  . ALPRAZolam (XANAX) 0.25 MG tablet Take 0.25 mg by mouth every 6 (six) hours as needed.     . benzonatate (TESSALON) 100 MG capsule Take 100 mg by mouth 3 (three) times daily as needed.   0  .  cyclobenzaprine (FLEXERIL) 10 MG tablet Take 10 mg by mouth 2 (two) times daily as needed for muscle spasms.    Marland Kitchen docusate sodium (COLACE) 100 MG capsule Take 100 mg by mouth 2 (two) times daily.  3  . fluticasone (FLONASE) 50 MCG/ACT nasal spray Place 1 spray into both nostrils daily.    Marland Kitchen lactulose (CHRONULAC) 10 GM/15ML solution 10 g 2 (two) times daily as needed.   0  . loratadine (CLARITIN) 10 MG tablet Take 10 mg by mouth daily.    Marland Kitchen losartan (COZAAR) 100 MG tablet Take 1 tablet (100 mg total) by mouth daily. 90 tablet 3  . magnesium chloride (SLOW-MAG) 64 MG TBEC SR tablet Take 1 tablet (64 mg total) by mouth 2 (two) times daily. 60 tablet 3  . meclizine (ANTIVERT) 12.5 MG tablet Take 12.5 mg by mouth 3 (three) times daily as needed.    . metoCLOPramide (REGLAN) 10 MG tablet Take 10 mg by mouth 4 (four) times daily.    . metoprolol succinate (TOPROL-XL) 25 MG 24 hr tablet   6  . Multiple Vitamin (MULTIVITAMIN WITH MINERALS) TABS tablet Take 1 tablet by mouth daily.    . nitroGLYCERIN (NITROSTAT) 0.4 MG SL tablet Place 0.4 mg under the tongue every 5 (five) minutes as needed.      . pantoprazole (PROTONIX) 40 MG tablet Take 1 tablet (40 mg total) by mouth daily. (Patient taking differently: Take 40 mg by mouth at bedtime. ) 90 tablet 3  . polyethylene glycol powder (GLYCOLAX/MIRALAX) powder Take 17 g by mouth daily as needed.   3  . potassium chloride (KLOR-CON) 20 MEQ packet Take 20 mEq by mouth daily. 30 packet 3   No current facility-administered medications for this visit.    OBJECTIVE:  Filed Vitals:   06/19/14 1443  BP: 150/74  Pulse: 83  Temp: 95.4 F (35.2 C)     Body mass index is 23.86 kg/(m^2).    ECOG FS:2 - Symptomatic, <50% confined to bed  PHYSICAL EXAM: Gen. status: Patient is in wheelchair.  Lymphatic system: No palpable supraclavicular cervical axillary inguinal adenopathy.  Lungs: Diminished air entry on both sides.  No rhonchi.  No rales.  Patient does have  Pleurx catheter still draining well pleural rub  Cardiac: Normal heart sounds.  Soft systolic murmur.  Abdomen: Soft.  No palpable masses no tenderness no ascites liver and spleen not palpable. Lower extremity trace edema Skin: Multiple ecchymosis Neurological system: Higher functions within normal limit.  Cranial nerves are intact.  Motor and sensory systems within normal limit All other systems were examined HEENT: No abnormality  LAB  RESULTS:  Appointment on 06/19/2014  Component Date Value Ref Range Status  . Magnesium 06/19/2014 1.9  1.7 - 2.4 mg/dL Final  . Potassium 06/19/2014 3.7  3.5 - 5.1 mmol/L Final    No results found for: LABCA2 Lab Results  Component Value Date   CA199 19 06/25/2013   Lab Results  Component Value Date   CEA 1.6 06/25/2013   No results found for: PSA Lab Results  Component Value Date   CA125 130.6* 12/30/2013     STUDIES: Ct Head Wo Contrast  06/09/2014   CLINICAL DATA:  Status post fall 06/08/2014. Bruising about the right eye and face.  EXAM: CT HEAD WITHOUT CONTRAST  TECHNIQUE: Contiguous axial images were obtained from the base of the skull through the vertex without intravenous contrast.  COMPARISON:  Head CT scan 08/11/2013 and 09/19/2007.  FINDINGS: Cortical atrophy chronic microvascular ischemic change are identified. No evidence of acute intracranial abnormality including hemorrhage, infarct, mass lesion, mass effect, midline shift or abnormal extra-axial fluid collection is seen. There is no hydrocephalus or pneumocephalus.  The patient has bilateral mastoid effusions, larger on the right, which are new since the prior exams. A small amount of frothy secretions are seen in the left sphenoid sinus. No fracture is identified.  IMPRESSION: No acute intracranial abnormality.  Atrophy and chronic microvascular ischemic change.  Right greater than left mastoid effusions and mucosal thickening left sphenoid sinus are new since the most recent head  CT.   Electronically Signed   By: Inge Rise M.D.   On: 06/09/2014 13:39    ASSESSMENT: 81 year old lady with follicular lymphoma appears to be in remission on maintenance Rituxan therapy Hypokinemia Hypomagnesemia Poor tolerance to oral magnesium tablet Jun 19, 2014 Interscapular pain is most likely due to Pleurx catheter rubbing against visceral pleura. Patient was advised to observe for any blisters to rule out this being pre-herpetic neuralgia  MEDICAL DECISION MAKING:  All lab data has been reviewed.  Will continue to supplement intravenous technetium.  Overall potassium has been started. Continue maintenance Rituxan Exact etiology of fall is not clear To control pain intravenous Toradol was given Hypomagnesemia and hypokalemia resolved  Patient expressed understanding and was in agreement with this plan. She also understands that She can call clinic at any time with any questions, concerns, or complaints.    No matching staging information was found for the patient.  Forest Gleason, MD   06/20/2014 2:06 PM    .:  .

## 2014-06-24 ENCOUNTER — Telehealth: Payer: Self-pay

## 2014-06-24 NOTE — Telephone Encounter (Signed)
Spoke w/ [name].  She reports that pt's BP has only been elevated today.  She usually checks BP every other day, but has checked it several times today.  Advised her that pt's may be agitated today, to check it once daily for the rest of the week and call back on Friday if it remains elevated.  She is agreeable and appreciative of the call.

## 2014-06-24 NOTE — Telephone Encounter (Signed)
Pt daughter states pt BP this am.was  159/68, at 1:30 165/67, now is 173/67. States pt has only taken the Losartan. Please call.

## 2014-06-24 NOTE — Telephone Encounter (Signed)
Pt daughter is calling, for no one called her back.  States bp has gone down but it still sound 150's  Would like to know what to do before we leave for the day.  Please advise.

## 2014-06-25 DIAGNOSIS — R208 Other disturbances of skin sensation: Secondary | ICD-10-CM | POA: Diagnosis not present

## 2014-06-25 DIAGNOSIS — I10 Essential (primary) hypertension: Secondary | ICD-10-CM | POA: Diagnosis not present

## 2014-06-25 DIAGNOSIS — R279 Unspecified lack of coordination: Secondary | ICD-10-CM | POA: Diagnosis not present

## 2014-06-25 DIAGNOSIS — C8232 Follicular lymphoma grade IIIa, intrathoracic lymph nodes: Secondary | ICD-10-CM | POA: Diagnosis not present

## 2014-06-25 DIAGNOSIS — J90 Pleural effusion, not elsewhere classified: Secondary | ICD-10-CM | POA: Diagnosis not present

## 2014-06-25 DIAGNOSIS — R531 Weakness: Secondary | ICD-10-CM | POA: Diagnosis not present

## 2014-07-02 ENCOUNTER — Telehealth: Payer: Self-pay | Admitting: *Deleted

## 2014-07-02 DIAGNOSIS — C8590 Non-Hodgkin lymphoma, unspecified, unspecified site: Secondary | ICD-10-CM

## 2014-07-02 NOTE — Telephone Encounter (Signed)
Agreed to come in @ 8:15 for lab possible IV Mg+

## 2014-07-03 ENCOUNTER — Inpatient Hospital Stay: Payer: Medicare Other

## 2014-07-03 ENCOUNTER — Inpatient Hospital Stay: Payer: Medicare Other | Attending: Oncology

## 2014-07-03 DIAGNOSIS — R5383 Other fatigue: Secondary | ICD-10-CM | POA: Diagnosis not present

## 2014-07-03 DIAGNOSIS — Z87891 Personal history of nicotine dependence: Secondary | ICD-10-CM | POA: Insufficient documentation

## 2014-07-03 DIAGNOSIS — C8290 Follicular lymphoma, unspecified, unspecified site: Secondary | ICD-10-CM | POA: Insufficient documentation

## 2014-07-03 DIAGNOSIS — E119 Type 2 diabetes mellitus without complications: Secondary | ICD-10-CM | POA: Insufficient documentation

## 2014-07-03 DIAGNOSIS — Z8052 Family history of malignant neoplasm of bladder: Secondary | ICD-10-CM | POA: Diagnosis not present

## 2014-07-03 DIAGNOSIS — F329 Major depressive disorder, single episode, unspecified: Secondary | ICD-10-CM | POA: Diagnosis not present

## 2014-07-03 DIAGNOSIS — R531 Weakness: Secondary | ICD-10-CM | POA: Diagnosis not present

## 2014-07-03 DIAGNOSIS — Z5111 Encounter for antineoplastic chemotherapy: Secondary | ICD-10-CM | POA: Insufficient documentation

## 2014-07-03 DIAGNOSIS — Z801 Family history of malignant neoplasm of trachea, bronchus and lung: Secondary | ICD-10-CM | POA: Diagnosis not present

## 2014-07-03 DIAGNOSIS — F419 Anxiety disorder, unspecified: Secondary | ICD-10-CM | POA: Diagnosis not present

## 2014-07-03 DIAGNOSIS — H9319 Tinnitus, unspecified ear: Secondary | ICD-10-CM | POA: Insufficient documentation

## 2014-07-03 DIAGNOSIS — Z800 Family history of malignant neoplasm of digestive organs: Secondary | ICD-10-CM | POA: Diagnosis not present

## 2014-07-03 DIAGNOSIS — Z79899 Other long term (current) drug therapy: Secondary | ICD-10-CM | POA: Diagnosis not present

## 2014-07-03 DIAGNOSIS — E876 Hypokalemia: Secondary | ICD-10-CM | POA: Diagnosis not present

## 2014-07-03 DIAGNOSIS — Z9181 History of falling: Secondary | ICD-10-CM | POA: Diagnosis not present

## 2014-07-03 DIAGNOSIS — I10 Essential (primary) hypertension: Secondary | ICD-10-CM | POA: Insufficient documentation

## 2014-07-03 DIAGNOSIS — G8929 Other chronic pain: Secondary | ICD-10-CM | POA: Diagnosis not present

## 2014-07-03 DIAGNOSIS — R011 Cardiac murmur, unspecified: Secondary | ICD-10-CM | POA: Diagnosis not present

## 2014-07-03 DIAGNOSIS — E780 Pure hypercholesterolemia: Secondary | ICD-10-CM | POA: Insufficient documentation

## 2014-07-03 DIAGNOSIS — C8590 Non-Hodgkin lymphoma, unspecified, unspecified site: Secondary | ICD-10-CM

## 2014-07-03 LAB — MAGNESIUM: Magnesium: 1.3 mg/dL — ABNORMAL LOW (ref 1.7–2.4)

## 2014-07-03 LAB — BASIC METABOLIC PANEL
Anion gap: 5 (ref 5–15)
BUN: 11 mg/dL (ref 6–20)
CALCIUM: 8.5 mg/dL — AB (ref 8.9–10.3)
CO2: 25 mmol/L (ref 22–32)
CREATININE: 0.8 mg/dL (ref 0.44–1.00)
Chloride: 101 mmol/L (ref 101–111)
GFR calc non Af Amer: >60 mL/min (ref >60)
GLUCOSE: 217 mg/dL — AB (ref 65–99)
POTASSIUM: 3.2 mmol/L — AB (ref 3.5–5.1)
SODIUM: 131 mmol/L — AB (ref 135–145)

## 2014-07-03 MED ORDER — MAGNESIUM SULFATE 2 GM/50ML IV SOLN
2.0000 g | Freq: Once | INTRAVENOUS | Status: DC
  Filled 2014-07-03: qty 50

## 2014-07-03 MED ORDER — MAGNESIUM SULFATE 2 GM/50ML IV SOLN
2.0000 g | Freq: Once | INTRAVENOUS | Status: AC
  Administered 2014-07-03: 2 g via INTRAVENOUS

## 2014-07-04 ENCOUNTER — Other Ambulatory Visit: Payer: Self-pay | Admitting: Oncology

## 2014-07-07 DIAGNOSIS — J910 Malignant pleural effusion: Secondary | ICD-10-CM | POA: Diagnosis not present

## 2014-07-17 ENCOUNTER — Inpatient Hospital Stay: Payer: Medicare Other

## 2014-07-17 ENCOUNTER — Encounter: Payer: Self-pay | Admitting: Oncology

## 2014-07-17 ENCOUNTER — Inpatient Hospital Stay (HOSPITAL_BASED_OUTPATIENT_CLINIC_OR_DEPARTMENT_OTHER): Payer: Medicare Other | Admitting: Oncology

## 2014-07-17 VITALS — BP 127/71 | HR 61 | Temp 97.9°F | Wt 126.1 lb

## 2014-07-17 DIAGNOSIS — F329 Major depressive disorder, single episode, unspecified: Secondary | ICD-10-CM

## 2014-07-17 DIAGNOSIS — R011 Cardiac murmur, unspecified: Secondary | ICD-10-CM

## 2014-07-17 DIAGNOSIS — R5383 Other fatigue: Secondary | ICD-10-CM

## 2014-07-17 DIAGNOSIS — E119 Type 2 diabetes mellitus without complications: Secondary | ICD-10-CM

## 2014-07-17 DIAGNOSIS — Z5111 Encounter for antineoplastic chemotherapy: Secondary | ICD-10-CM | POA: Diagnosis not present

## 2014-07-17 DIAGNOSIS — C8590 Non-Hodgkin lymphoma, unspecified, unspecified site: Secondary | ICD-10-CM

## 2014-07-17 DIAGNOSIS — E780 Pure hypercholesterolemia: Secondary | ICD-10-CM

## 2014-07-17 DIAGNOSIS — Z87891 Personal history of nicotine dependence: Secondary | ICD-10-CM

## 2014-07-17 DIAGNOSIS — Z9181 History of falling: Secondary | ICD-10-CM

## 2014-07-17 DIAGNOSIS — Z8052 Family history of malignant neoplasm of bladder: Secondary | ICD-10-CM

## 2014-07-17 DIAGNOSIS — Z801 Family history of malignant neoplasm of trachea, bronchus and lung: Secondary | ICD-10-CM

## 2014-07-17 DIAGNOSIS — E876 Hypokalemia: Secondary | ICD-10-CM | POA: Diagnosis not present

## 2014-07-17 DIAGNOSIS — C8290 Follicular lymphoma, unspecified, unspecified site: Secondary | ICD-10-CM

## 2014-07-17 DIAGNOSIS — Z800 Family history of malignant neoplasm of digestive organs: Secondary | ICD-10-CM

## 2014-07-17 DIAGNOSIS — I10 Essential (primary) hypertension: Secondary | ICD-10-CM

## 2014-07-17 DIAGNOSIS — H9319 Tinnitus, unspecified ear: Secondary | ICD-10-CM

## 2014-07-17 DIAGNOSIS — Z79899 Other long term (current) drug therapy: Secondary | ICD-10-CM

## 2014-07-17 DIAGNOSIS — R531 Weakness: Secondary | ICD-10-CM | POA: Diagnosis not present

## 2014-07-17 DIAGNOSIS — F419 Anxiety disorder, unspecified: Secondary | ICD-10-CM

## 2014-07-17 DIAGNOSIS — G8929 Other chronic pain: Secondary | ICD-10-CM

## 2014-07-17 LAB — CBC WITH DIFFERENTIAL/PLATELET
BASOS PCT: 2 %
Basophils Absolute: 0.1 10*3/uL (ref 0–0.1)
EOS PCT: 2 %
Eosinophils Absolute: 0.1 10*3/uL (ref 0–0.7)
HEMATOCRIT: 40 % (ref 35.0–47.0)
Hemoglobin: 13 g/dL (ref 12.0–16.0)
LYMPHS ABS: 1.8 10*3/uL (ref 1.0–3.6)
Lymphocytes Relative: 46 %
MCH: 28.9 pg (ref 26.0–34.0)
MCHC: 32.6 g/dL (ref 32.0–36.0)
MCV: 88.7 fL (ref 80.0–100.0)
MONOS PCT: 13 %
Monocytes Absolute: 0.5 10*3/uL (ref 0.2–0.9)
NEUTROS ABS: 1.4 10*3/uL (ref 1.4–6.5)
Neutrophils Relative %: 37 %
Platelets: 117 10*3/uL — ABNORMAL LOW (ref 150–440)
RBC: 4.51 MIL/uL (ref 3.80–5.20)
RDW: 13.2 % (ref 11.5–14.5)
WBC: 3.9 10*3/uL (ref 3.6–11.0)

## 2014-07-17 LAB — COMPREHENSIVE METABOLIC PANEL
ALT: 13 U/L — ABNORMAL LOW (ref 14–54)
AST: 24 U/L (ref 15–41)
Albumin: 3.2 g/dL — ABNORMAL LOW (ref 3.5–5.0)
Alkaline Phosphatase: 100 U/L (ref 38–126)
Anion gap: 5 (ref 5–15)
BILIRUBIN TOTAL: 0.5 mg/dL (ref 0.3–1.2)
BUN: 10 mg/dL (ref 6–20)
CALCIUM: 8 mg/dL — AB (ref 8.9–10.3)
CO2: 27 mmol/L (ref 22–32)
Chloride: 101 mmol/L (ref 101–111)
Creatinine, Ser: 0.77 mg/dL (ref 0.44–1.00)
GFR calc non Af Amer: >60 mL/min (ref >60)
GLUCOSE: 160 mg/dL — AB (ref 65–99)
Potassium: 3.2 mmol/L — ABNORMAL LOW (ref 3.5–5.1)
Sodium: 133 mmol/L — ABNORMAL LOW (ref 135–145)
Total Protein: 6 g/dL — ABNORMAL LOW (ref 6.5–8.1)

## 2014-07-17 LAB — MAGNESIUM: MAGNESIUM: 1.4 mg/dL — AB (ref 1.7–2.4)

## 2014-07-17 MED ORDER — AZITHROMYCIN 500 MG PO TABS: 500.0000 mg | ORAL_TABLET | Freq: Every day | ORAL | Status: DC

## 2014-07-17 MED ORDER — HEPARIN SOD (PORK) LOCK FLUSH 100 UNIT/ML IV SOLN
500.0000 [IU] | Freq: Once | INTRAVENOUS | Status: AC
  Administered 2014-07-17: 500 [IU] via INTRAVENOUS
  Filled 2014-07-17: qty 5

## 2014-07-17 MED ORDER — SODIUM CHLORIDE 0.9 % IJ SOLN
10.0000 mL | INTRAMUSCULAR | Status: DC | PRN
  Administered 2014-07-17: 10 mL via INTRAVENOUS
  Filled 2014-07-17: qty 10

## 2014-07-17 MED ORDER — MAGNESIUM SULFATE 50 % IJ SOLN
Freq: Once | INTRAMUSCULAR | Status: AC
  Administered 2014-07-17: 15:00:00 via INTRAVENOUS
  Filled 2014-07-17: qty 1000

## 2014-07-17 NOTE — Progress Notes (Signed)
Patient does have living will.  Former smoker.C/o pain in right ear and sore throat on right side.  States it woke her up in the night.

## 2014-07-18 MED ORDER — LIDOCAINE-PRILOCAINE 2.5-2.5 % EX CREA: 1.0000 "application " | TOPICAL_CREAM | CUTANEOUS | Status: DC | PRN

## 2014-07-23 DIAGNOSIS — K120 Recurrent oral aphthae: Secondary | ICD-10-CM | POA: Diagnosis not present

## 2014-07-23 DIAGNOSIS — B379 Candidiasis, unspecified: Secondary | ICD-10-CM | POA: Diagnosis not present

## 2014-07-23 DIAGNOSIS — J029 Acute pharyngitis, unspecified: Secondary | ICD-10-CM | POA: Diagnosis not present

## 2014-07-23 DIAGNOSIS — R070 Pain in throat: Secondary | ICD-10-CM | POA: Diagnosis not present

## 2014-07-23 DIAGNOSIS — H6521 Chronic serous otitis media, right ear: Secondary | ICD-10-CM | POA: Diagnosis not present

## 2014-07-23 DIAGNOSIS — H905 Unspecified sensorineural hearing loss: Secondary | ICD-10-CM | POA: Diagnosis not present

## 2014-07-24 ENCOUNTER — Inpatient Hospital Stay: Payer: Medicare Other

## 2014-07-24 VITALS — BP 152/76 | HR 58 | Temp 96.1°F | Resp 18

## 2014-07-24 DIAGNOSIS — Z5111 Encounter for antineoplastic chemotherapy: Secondary | ICD-10-CM | POA: Diagnosis not present

## 2014-07-24 DIAGNOSIS — C8590 Non-Hodgkin lymphoma, unspecified, unspecified site: Secondary | ICD-10-CM

## 2014-07-24 DIAGNOSIS — C8290 Follicular lymphoma, unspecified, unspecified site: Secondary | ICD-10-CM | POA: Diagnosis not present

## 2014-07-24 DIAGNOSIS — R531 Weakness: Secondary | ICD-10-CM | POA: Diagnosis not present

## 2014-07-24 DIAGNOSIS — Z79899 Other long term (current) drug therapy: Secondary | ICD-10-CM | POA: Diagnosis not present

## 2014-07-24 DIAGNOSIS — E876 Hypokalemia: Secondary | ICD-10-CM | POA: Diagnosis not present

## 2014-07-24 LAB — MAGNESIUM: Magnesium: 1.5 mg/dL — ABNORMAL LOW (ref 1.7–2.4)

## 2014-07-24 LAB — POTASSIUM: POTASSIUM: 3.5 mmol/L (ref 3.5–5.1)

## 2014-07-24 MED ORDER — MAGNESIUM SULFATE 2 GM/50ML IV SOLN
2.0000 g | Freq: Once | INTRAVENOUS | Status: AC
  Administered 2014-07-24: 2 g via INTRAVENOUS
  Filled 2014-07-24: qty 50

## 2014-07-24 MED ORDER — DIPHENHYDRAMINE HCL 25 MG PO CAPS
25.0000 mg | ORAL_CAPSULE | Freq: Once | ORAL | Status: AC
  Administered 2014-07-24: 25 mg via ORAL
  Filled 2014-07-24: qty 1

## 2014-07-24 MED ORDER — ACETAMINOPHEN 325 MG PO TABS
650.0000 mg | ORAL_TABLET | Freq: Once | ORAL | Status: AC
  Administered 2014-07-24: 650 mg via ORAL
  Filled 2014-07-24: qty 2

## 2014-07-24 MED ORDER — SODIUM CHLORIDE 0.9 % IJ SOLN
10.0000 mL | INTRAMUSCULAR | Status: DC | PRN
  Administered 2014-07-24: 10 mL
  Filled 2014-07-24: qty 10

## 2014-07-24 MED ORDER — HEPARIN SOD (PORK) LOCK FLUSH 100 UNIT/ML IV SOLN
500.0000 [IU] | Freq: Once | INTRAVENOUS | Status: AC
  Filled 2014-07-24: qty 5

## 2014-07-24 MED ORDER — SODIUM CHLORIDE 0.9 % IV SOLN
375.0000 mg/m2 | Freq: Once | INTRAVENOUS | Status: AC
  Administered 2014-07-24: 600 mg via INTRAVENOUS
  Filled 2014-07-24: qty 50

## 2014-07-24 MED ORDER — HEPARIN SOD (PORK) LOCK FLUSH 100 UNIT/ML IV SOLN
500.0000 [IU] | Freq: Once | INTRAVENOUS | Status: AC | PRN
  Administered 2014-07-24: 500 [IU]

## 2014-07-24 MED ORDER — SODIUM CHLORIDE 0.9 % IV SOLN
Freq: Once | INTRAVENOUS | Status: AC
  Administered 2014-07-24: 11:00:00 via INTRAVENOUS
  Filled 2014-07-24: qty 1000

## 2014-07-24 MED ORDER — SODIUM CHLORIDE 0.9 % IJ SOLN
10.0000 mL | INTRAMUSCULAR | Status: AC | PRN
  Administered 2014-07-24: 10 mL via INTRAVENOUS
  Filled 2014-07-24: qty 10

## 2014-07-28 ENCOUNTER — Encounter: Payer: Self-pay | Admitting: Oncology

## 2014-07-28 NOTE — Progress Notes (Signed)
[place] @ [facility] Telephone:(336) [phone]  Fax:(336) [place]: [birth year]  MR#: [id number]  AQT#:[id number]  Patient Care Team: Arlis Porta., MD as PCP - General (Unknown Physician Specialty)  CHIEF COMPLAINT:  Chief Complaint  Patient presents with  . Follow-up    Oncology History   1. Biopsy of para-aortic lymph node is consistent with follicular lymphoma, clinically stage as 3A Diagnosis in July of 2015. Started on rituximab on July 31, 2013 2. Started on [place]  and Rituxan on November 04, 2013 3 maintenance rituximab started from April of 2016      Lymphoma   12/12/2013 Initial Diagnosis Lymphoma    Oncology Flowsheet 07/24/2014  diphenhydrAMINE (BENADRYL) PO 25 mg  riTUXimab (RITUXAN) IV 375 mg/m2    INTERVAL HISTORY: 81 year old lady with a history of follicular lymphoma recently fell down.  Patient did not lose any consciousness there was no history of syncopal attack or seizure activity.  Continues to feel weak.  Remains hypokalemic and hypomagnesemic.  Patient does not tolerate oral mag oxide.  Slow-Mag would be started.  Potassium supplements been planned.  No chills.  No fever.  No nausea. Jun 19, 2014 Patient came as an add on for evaluating intrascapular pain.  Pain is more when patient tries to cough.  No chills.  No fever. July 17, 2014 Patient is here for maintenance rituximab therapy.  Feeling better.  Continues to have problem with weakness.  Continues to have 200 cc of fluid drained.  From the Pleurx catheter hypomagnesemia persist No chills.  No fever.  REVIEW OF SYSTEMS:   Gen. status.  patient continues to go weak and tired.  Frequent fall.  HEENT: Head echymosis on the face following fall Lungs: Shortness of breath on exertion.  Cardiac: No chest pain.  Skin: No rash.  Multiple ecchymotic or area\neurological system: No headache.  No dizziness.  No other focal symptoms.  No lower extremity edema.  Abdomen: No abdominal  pain.  No nausea.  No vomiting.  No diarrhea.  No rectal bleeding.. GU: No dysuria or hematuria Lower extremity no swelling. Musculoskeletal system intrascapular pain Neurological system: No occasional dizziness.  No other focal signs patienr Intrascapular pain associated with breathing and coughing Patient has a Pleurx catheter and had 200cc of pleural fluid removed As per HPI. Otherwise, a complete review of systems is negatve.  PAST MEDICAL HISTORY: Past Medical History  Diagnosis Date  . HTN (hypertension)   . DM2 (diabetes mellitus, type 2)     insulin requiring  . Hypercholesterolemia   . Murmur     hx  . Tinnitus     chronic  . Macular degeneration   . Chronic chest pain     a. nonobs cath 2002.  Marland Kitchen Palpitations     a. 01/2011 Holter monitor showed frequent PACs, sinus bradycardia  . Anxiety   . Depression   . Irregular cardiac rhythm   . Heart murmur   . Cataract   . Non Hodgkin's lymphoma   . Hypomagnesemia 06/13/2014    PAST SURGICAL HISTORY: Past Surgical History  Procedure Laterality Date  . Cardiac catheterization  2002  . Tumor removal  2012    benign neck tumor removed  . Tooth extraction    . Knee surgery Left   . Ankle surgery Left   . Partial hysterectomy    . Insertion / placement pleural catheter      FAMILY HISTORY Family History  Problem  Relation Age of Onset  . Heart disease Mother   . Lung cancer Brother     smoked  . Bladder Cancer Brother   . Stomach cancer Brother     GYNECOLOGIC HISTORY:  No LMP recorded. Patient has had a hysterectomy.     ADVANCED DIRECTIVES: Patient does have living will   HEALTH MAINTENANCE: History  Substance Use Topics  . Smoking status: Former Smoker -- 0.25 packs/day for 7 years    Types: Cigarettes    Quit date: 01/25/1988  . Smokeless tobacco: Never Used  . Alcohol Use: No       Allergies  Allergen Reactions  . Moxifloxacin Shortness Of Breath    hallucinations  . Codeine Other (See  Comments)    unknown  . Penicillins     Itching  Rash   . Prednisone     Hallucinations  . Unasyn [Ampicillin-Sulbactam Sodium] Other (See Comments)    unknown    Current Outpatient Prescriptions  Medication Sig Dispense Refill  . ALPRAZolam (XANAX) 0.25 MG tablet Take 0.25 mg by mouth every 6 (six) hours as needed.     . benzonatate (TESSALON) 100 MG capsule Take 100 mg by mouth 3 (three) times daily as needed.   0  . cyclobenzaprine (FLEXERIL) 10 MG tablet Take 10 mg by mouth 2 (two) times daily as needed for muscle spasms.    Marland Kitchen docusate sodium (COLACE) 100 MG capsule TAKE ONE CAPSULE BY MOUTH TWICE A DAY 60 capsule 3  . fluticasone (FLONASE) 50 MCG/ACT nasal spray Place 1 spray into both nostrils daily.    Marland Kitchen lactulose (CHRONULAC) 10 GM/15ML solution 10 g 2 (two) times daily as needed.   0  . loratadine (CLARITIN) 10 MG tablet Take 10 mg by mouth daily.    Marland Kitchen losartan (COZAAR) 100 MG tablet Take 1 tablet (100 mg total) by mouth daily. 90 tablet 3  . magnesium chloride (SLOW-MAG) 64 MG TBEC SR tablet Take 1 tablet (64 mg total) by mouth 2 (two) times daily. 60 tablet 3  . meclizine (ANTIVERT) 12.5 MG tablet Take 12.5 mg by mouth 3 (three) times daily as needed.    . metoCLOPramide (REGLAN) 10 MG tablet Take 10 mg by mouth 4 (four) times daily.    . metoprolol succinate (TOPROL-XL) 25 MG 24 hr tablet   6  . nitroGLYCERIN (NITROSTAT) 0.4 MG SL tablet Place 0.4 mg under the tongue every 5 (five) minutes as needed.      . pantoprazole (PROTONIX) 40 MG tablet Take 1 tablet (40 mg total) by mouth daily. (Patient taking differently: Take 40 mg by mouth at bedtime. ) 90 tablet 3  . polyethylene glycol powder (GLYCOLAX/MIRALAX) powder Take 17 g by mouth daily as needed.   3  . azithromycin (ZITHROMAX) 500 MG tablet Take 1 tablet (500 mg total) by mouth daily. 3 tablet 0  . lidocaine-prilocaine (EMLA) cream Apply 1 application topically as needed. 30 g 3  . Multiple Vitamin (MULTIVITAMIN WITH  MINERALS) TABS tablet Take 1 tablet by mouth daily.    . potassium chloride (KLOR-CON) 20 MEQ packet Take 20 mEq by mouth daily. (Patient not taking: Reported on 07/17/2014) 30 packet 3   No current facility-administered medications for this visit.   Facility-Administered Medications Ordered in Other Visits  Medication Dose Route Frequency Provider Last Rate Last Dose  . heparin lock flush 100 unit/mL  500 Units Intravenous Once Forest Gleason, MD   500 Units at 07/24/14 1630  . sodium  chloride 0.9 % injection 10 mL  10 mL Intravenous PRN Forest Gleason, MD   10 mL at 07/24/14 1020  . sodium chloride 0.9 % injection 10 mL  10 mL Intracatheter PRN Forest Gleason, MD   10 mL at 07/24/14 1020    OBJECTIVE:  Filed Vitals:   07/17/14 1410  BP: 127/71  Pulse: 61  Temp: 97.9 F (36.6 C)     Body mass index is 23.06 kg/(m^2).    ECOG FS:2 - Symptomatic, <50% confined to bed  PHYSICAL EXAM: Gen. status: Patient is in wheelchair.  Lymphatic system: No palpable supraclavicular cervical axillary inguinal adenopathy.  Lungs: Diminished air entry on both sides.  No rhonchi.  No rales.  Patient does have Pleurx catheter still draining well pleural rub  Cardiac: Normal heart sounds.  Soft systolic murmur. Lymphatic system: Supraclavicular, cervical, axillary, inguinal lymph nodes are not palpable Abdomen: Soft.  No palpable masses no tenderness no ascites liver and spleen not palpable. Lower extremity trace edema Skin: Multiple ecchymosis Neurological system: Higher functions within normal limit.  Cranial nerves are intact.  Motor and sensory systems within normal limit All other systems were examined HEENT: No abnormality  LAB RESULTS:  Infusion on 07/17/2014  Component Date Value Ref Range Status  . WBC 07/17/2014 3.9  3.6 - 11.0 K/uL Final   A-LINE DRAW  . RBC 07/17/2014 4.51  3.80 - 5.20 MIL/uL Final  . Hemoglobin 07/17/2014 13.0  12.0 - 16.0 g/dL Final  . HCT 07/17/2014 40.0  35.0 - 47.0 % Final   . MCV 07/17/2014 88.7  80.0 - 100.0 fL Final  . MCH 07/17/2014 28.9  26.0 - 34.0 pg Final  . MCHC 07/17/2014 32.6  32.0 - 36.0 g/dL Final  . RDW 07/17/2014 13.2  11.5 - 14.5 % Final  . Platelets 07/17/2014 117* 150 - 440 K/uL Final  . Neutrophils Relative % 07/17/2014 37   Final  . Lymphocytes Relative 07/17/2014 46   Final  . Monocytes Relative 07/17/2014 13   Final  . Eosinophils Relative 07/17/2014 2   Final  . Basophils Relative 07/17/2014 2   Final  . Neutro Abs 07/17/2014 1.4  1.4 - 6.5 K/uL Final  . Lymphs Abs 07/17/2014 1.8  1.0 - 3.6 K/uL Final  . Monocytes Absolute 07/17/2014 0.5  0.2 - 0.9 K/uL Final  . Eosinophils Absolute 07/17/2014 0.1  0 - 0.7 K/uL Final  . Basophils Absolute 07/17/2014 0.1  0 - 0.1 K/uL Final  . Sodium 07/17/2014 133* 135 - 145 mmol/L Final  . Potassium 07/17/2014 3.2* 3.5 - 5.1 mmol/L Final  . Chloride 07/17/2014 101  101 - 111 mmol/L Final  . CO2 07/17/2014 27  22 - 32 mmol/L Final  . Glucose, Bld 07/17/2014 160* 65 - 99 mg/dL Final  . BUN 07/17/2014 10  6 - 20 mg/dL Final  . Creatinine, Ser 07/17/2014 0.77  0.44 - 1.00 mg/dL Final  . Calcium 07/17/2014 8.0* 8.9 - 10.3 mg/dL Final  . Total Protein 07/17/2014 6.0* 6.5 - 8.1 g/dL Final  . Albumin 07/17/2014 3.2* 3.5 - 5.0 g/dL Final  . AST 07/17/2014 24  15 - 41 U/L Final  . ALT 07/17/2014 13* 14 - 54 U/L Final  . Alkaline Phosphatase 07/17/2014 100  38 - 126 U/L Final  . Total Bilirubin 07/17/2014 0.5  0.3 - 1.2 mg/dL Final  . GFR calc non Af Amer 07/17/2014 >60  >60 mL/min Final  . GFR calc Af Amer 07/17/2014 >60  >  60 mL/min Final   Comment: (NOTE) The eGFR has been calculated using the CKD EPI equation. This calculation has not been validated in all clinical situations. eGFR's persistently <60 mL/min signify possible Chronic Kidney Disease.   . Anion gap 07/17/2014 5  5 - 15 Final  . Magnesium 07/17/2014 1.4* 1.7 - 2.4 mg/dL Final   Comment: RESULT REPEATED AND VERIFIED CRITICAL RESULT  CALLED TO, READ BACK BY AND VERIFIED WITH: HAYLEY RHODE 1454 07/17/14 by LHG     No results found for: LABCA2 Lab Results  Component Value Date   CA199 19 06/25/2013   Lab Results  Component Value Date   CEA 1.6 06/25/2013   No results found for: PSA Lab Results  Component Value Date   CA125 130.6* 12/30/2013     ASSESSMENT: 81 year old lady with follicular lymphoma appears to be in remission on maintenance Rituxan therapy Hypokinemia Hypomagnesemia Will continue IV magnesium supplement as patient has poor tolerance to oral magnesium Continue maintenance rituximab therapy every 2 months  MEDICAL DECISION MAKING:  All lab data has been reviewed.  Will continue to supplement intravenous technetium.  Overall potassium has been started. Continue maintenance Rituxan Exact etiology of fall is not clear IV magnesium was given  Total duration of visit was 45 minutes.  50% or more time was spent in counseling patient and family regarding prognosis and options of treatment and available resourcesn   Patient expressed understanding and was in agreement with this plan. She also understands that She can call clinic at any time with any questions, concerns, or complaints.    No matching staging information was found for the patient.  Forest Gleason, MD   07/28/2014 7:25 PM    .:  .

## 2014-07-31 DIAGNOSIS — H6981 Other specified disorders of Eustachian tube, right ear: Secondary | ICD-10-CM | POA: Diagnosis not present

## 2014-07-31 DIAGNOSIS — H6521 Chronic serous otitis media, right ear: Secondary | ICD-10-CM | POA: Diagnosis not present

## 2014-07-31 DIAGNOSIS — H902 Conductive hearing loss, unspecified: Secondary | ICD-10-CM | POA: Diagnosis not present

## 2014-08-08 DIAGNOSIS — J910 Malignant pleural effusion: Secondary | ICD-10-CM | POA: Diagnosis not present

## 2014-08-11 ENCOUNTER — Telehealth: Payer: Self-pay | Admitting: *Deleted

## 2014-08-11 NOTE — Telephone Encounter (Signed)
Attempting to reach [place], left message to return call to schedule her for tomorrow @ 815

## 2014-08-11 NOTE — Telephone Encounter (Signed)
I called and spoke with [place] appt time for tomorrow given to her. She stated she would let [name] know

## 2014-08-12 ENCOUNTER — Other Ambulatory Visit: Payer: Medicare Other

## 2014-08-12 ENCOUNTER — Inpatient Hospital Stay: Payer: Medicare Other

## 2014-08-12 ENCOUNTER — Inpatient Hospital Stay: Payer: Medicare Other | Attending: Oncology

## 2014-08-12 DIAGNOSIS — Z79899 Other long term (current) drug therapy: Secondary | ICD-10-CM | POA: Diagnosis not present

## 2014-08-12 LAB — MAGNESIUM: MAGNESIUM: 1.5 mg/dL — AB (ref 1.7–2.4)

## 2014-08-12 MED ORDER — SODIUM CHLORIDE 0.9 % IJ SOLN
10.0000 mL | INTRAMUSCULAR | Status: AC | PRN
  Administered 2014-08-12: 10 mL via INTRAVENOUS
  Filled 2014-08-12: qty 10

## 2014-08-12 MED ORDER — SODIUM CHLORIDE 0.9 % IV SOLN
Freq: Once | INTRAVENOUS | Status: AC
  Administered 2014-08-12: 10:00:00 via INTRAVENOUS
  Filled 2014-08-12: qty 500

## 2014-08-12 MED ORDER — HEPARIN SOD (PORK) LOCK FLUSH 100 UNIT/ML IV SOLN
500.0000 [IU] | Freq: Once | INTRAVENOUS | Status: AC
  Administered 2014-08-12: 500 [IU] via INTRAVENOUS
  Filled 2014-08-12: qty 5

## 2014-08-14 ENCOUNTER — Inpatient Hospital Stay: Payer: Medicare Other

## 2014-08-14 DIAGNOSIS — Z79899 Other long term (current) drug therapy: Secondary | ICD-10-CM | POA: Diagnosis not present

## 2014-08-14 DIAGNOSIS — C8590 Non-Hodgkin lymphoma, unspecified, unspecified site: Secondary | ICD-10-CM

## 2014-08-14 LAB — POTASSIUM: Potassium: 3.1 mmol/L — ABNORMAL LOW (ref 3.5–5.1)

## 2014-08-14 LAB — MAGNESIUM: Magnesium: 1.6 mg/dL — ABNORMAL LOW (ref 1.7–2.4)

## 2014-08-14 MED ORDER — HEPARIN SOD (PORK) LOCK FLUSH 100 UNIT/ML IV SOLN
500.0000 [IU] | Freq: Once | INTRAVENOUS | Status: AC
Start: 2014-08-14 — End: 2014-08-14
  Administered 2014-08-14: 500 [IU] via INTRAVENOUS
  Filled 2014-08-14: qty 5

## 2014-08-14 MED ORDER — SODIUM CHLORIDE 0.9 % IJ SOLN
10.0000 mL | INTRAMUSCULAR | Status: DC | PRN
  Administered 2014-08-14: 10 mL via INTRAVENOUS
  Filled 2014-08-14: qty 10

## 2014-08-14 MED ORDER — SODIUM CHLORIDE 0.9 % IV SOLN
Freq: Once | INTRAVENOUS | Status: AC
  Administered 2014-08-14: 15:00:00 via INTRAVENOUS
  Filled 2014-08-14: qty 250

## 2014-08-16 ENCOUNTER — Other Ambulatory Visit: Payer: Self-pay | Admitting: Family Medicine

## 2014-08-21 IMAGING — CR DG CHEST POST BIOPSY - THORACENTESIS
1 series · 1 of 1 positions shown · non-contrast
Comparison: July 15, 2013

CLINICAL DATA: Post thoracentesis

EXAM:
CHEST XRAY 1 VIEW

[w chest pa]
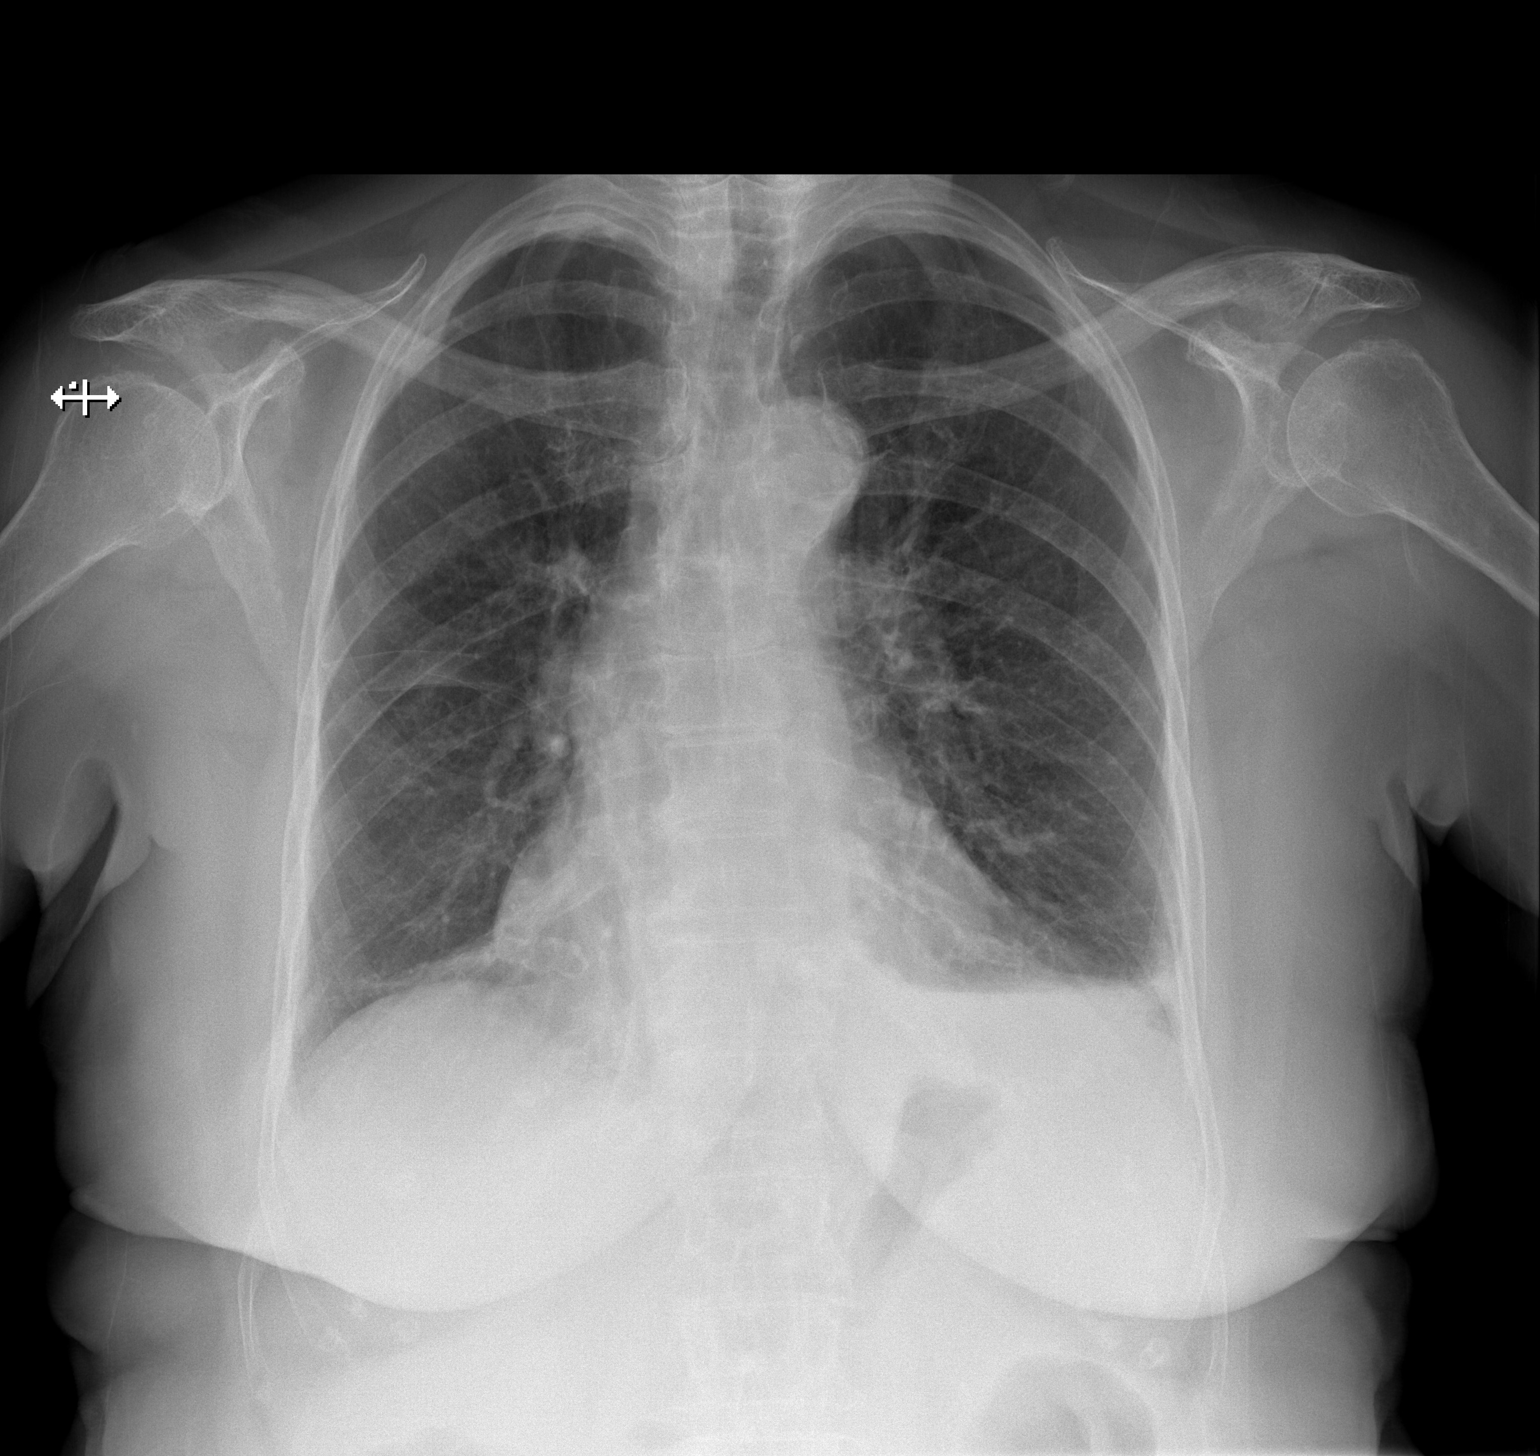

[1 of 1 positions shown; findings below may reference images not displayed]

FINDINGS: Exploration view obtained. There is no appreciable effusion post
thoracentesis the right. There is a small effusion on the left.
There is no demonstrable pneumothorax. There is no edema or
consolidation. There is mild subsegmental atelectasis in the right
base. Heart size and pulmonary vascularity are normal. No
adenopathy. There is atherosclerotic change in the aorta.
IMPRESSION: No pneumothorax. Minimal right base atelectasis but no appreciable
right effusion. Small left pleural effusion.

## 2014-08-21 IMAGING — US US GUIDE NEEDLE - US [PERSON_NAME]
1 series · 4 of 4 positions shown · non-contrast
Comparison: none

CLINICAL DATA: Lymphoma, right-sided pleural effusion

[Series 1: us guide needle - us (person_name) · 0.32mm/px · 4 of 4 slices shown]
[im 1/4]
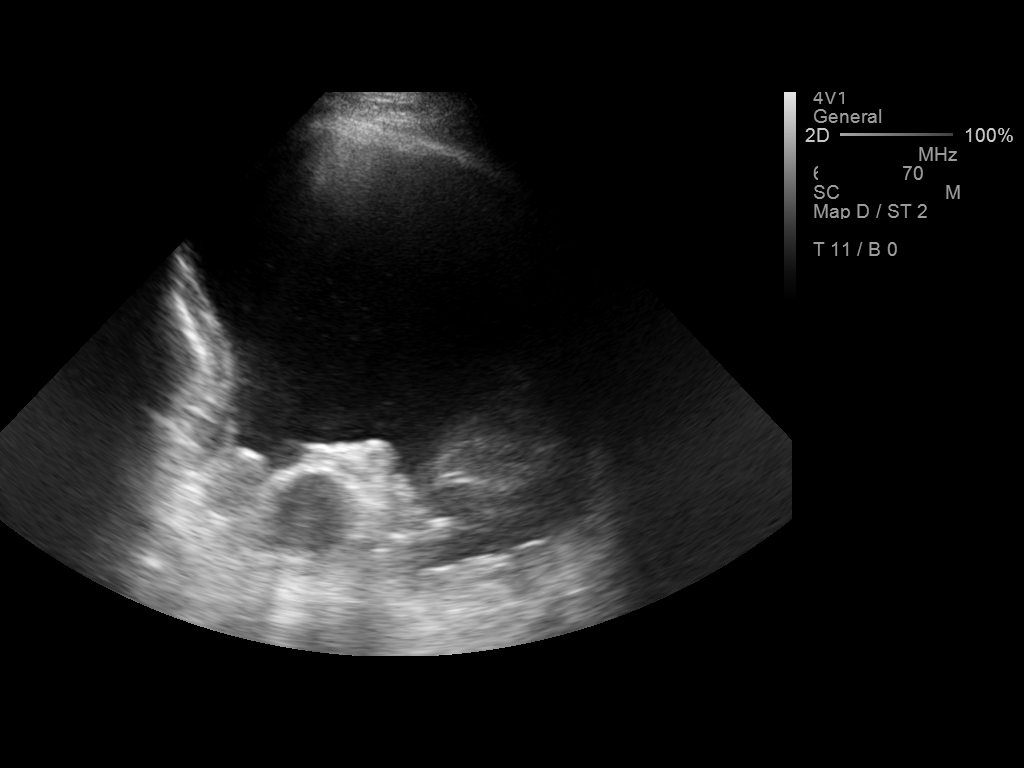
[im 2/4]
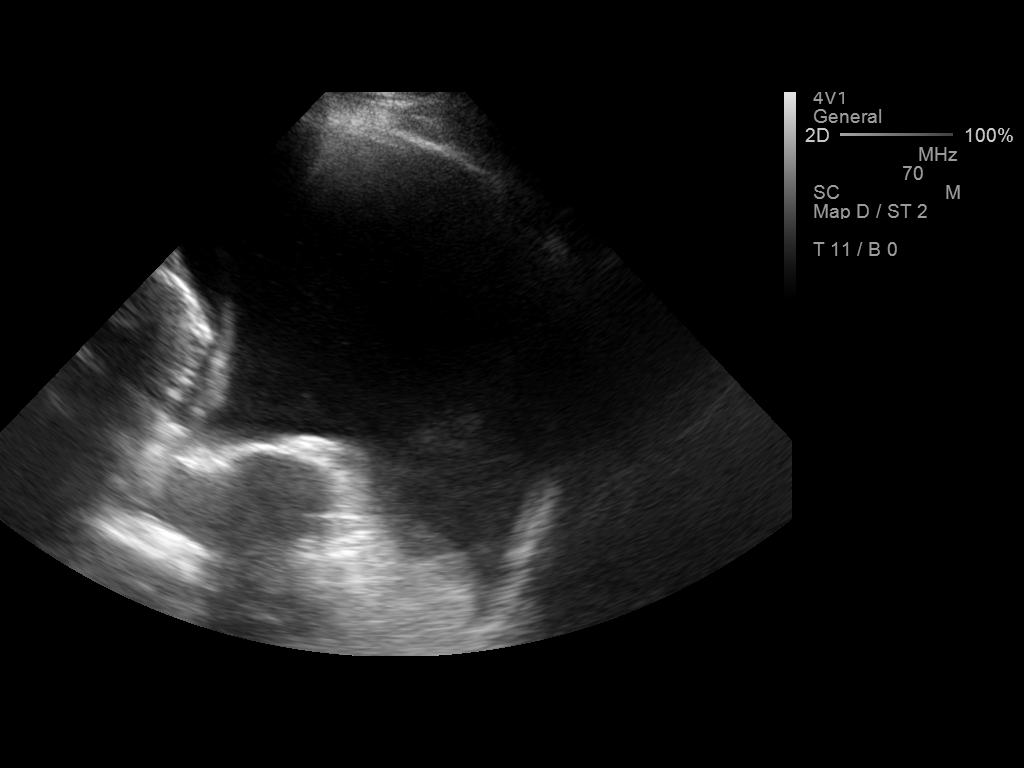
[im 3/4]
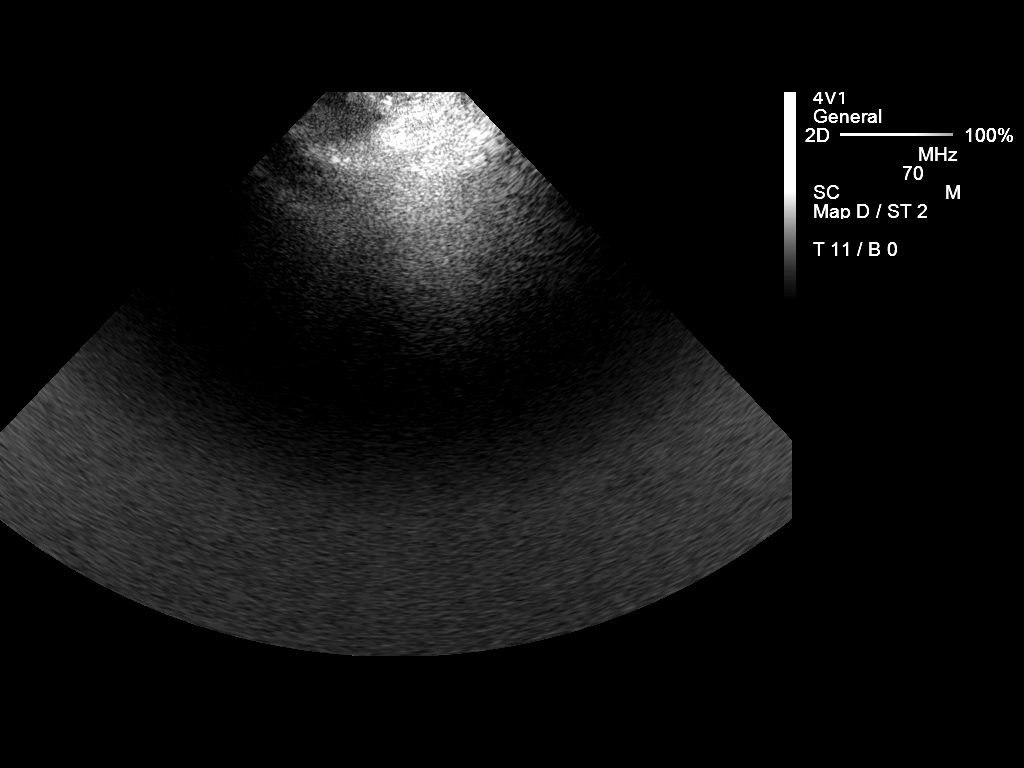
[im 4/4]
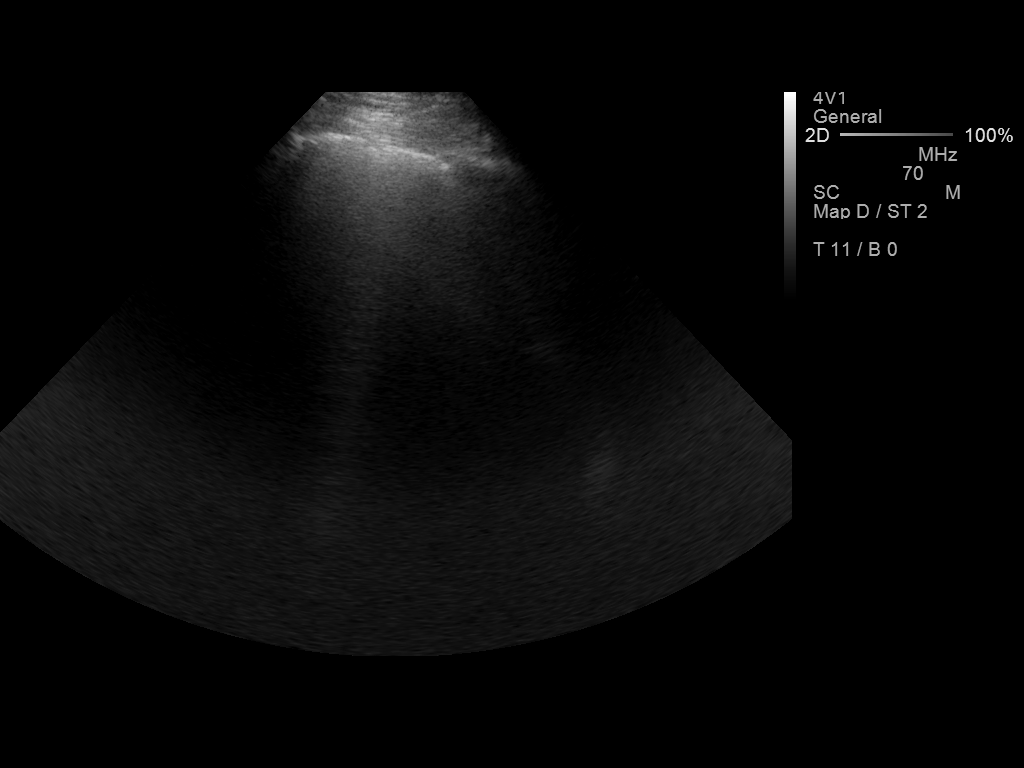

[4 of 4 positions shown; findings below may reference images not displayed]

EXAM:
ULTRASOUND GUIDED right THORACENTESIS

PROCEDURE:
An ultrasound guided thoracentesis was thoroughly discussed with the
patient and questions answered. The benefits, risks, alternatives
and complications were also discussed. The patient understands and
wishes to proceed with the procedure. Written consent was obtained.

Ultrasound was performed to localize and mark an adequate pocket of
fluid in the right chest. The area was then prepped and draped in
the normal sterile fashion. 1% Lidocaine was used for local
anesthesia. Under ultrasound guidance a 6 French thoracentesis
catheter was introduced. Thoracentesis was performed. The catheter
was removed and a dressing applied.

Complications: None
FINDINGS: A total of approximately 1.35 L of somewhat cloudy yellow fluid was
removed. A fluid sample was not sent for laboratory analysis.
IMPRESSION: Successful ultrasound guided right thoracentesis yielding 1.35 L of
pleural fluid.

## 2014-08-25 ENCOUNTER — Telehealth: Payer: Self-pay | Admitting: *Deleted

## 2014-08-25 DIAGNOSIS — H6521 Chronic serous otitis media, right ear: Secondary | ICD-10-CM | POA: Diagnosis not present

## 2014-08-25 NOTE — Telephone Encounter (Signed)
Asking to have mag checked, having sx again. Per Dr Oliva Bustard, can come in tomorrow and to schedule chair for possible Mag infusion Appt for 130 tomorrow given and agreed on

## 2014-08-26 ENCOUNTER — Inpatient Hospital Stay: Payer: Medicare Other

## 2014-08-26 ENCOUNTER — Inpatient Hospital Stay: Payer: Medicare Other | Attending: Oncology

## 2014-08-26 DIAGNOSIS — I10 Essential (primary) hypertension: Secondary | ICD-10-CM | POA: Insufficient documentation

## 2014-08-26 DIAGNOSIS — Z800 Family history of malignant neoplasm of digestive organs: Secondary | ICD-10-CM | POA: Insufficient documentation

## 2014-08-26 DIAGNOSIS — R531 Weakness: Secondary | ICD-10-CM | POA: Diagnosis not present

## 2014-08-26 DIAGNOSIS — R0602 Shortness of breath: Secondary | ICD-10-CM | POA: Insufficient documentation

## 2014-08-26 DIAGNOSIS — F419 Anxiety disorder, unspecified: Secondary | ICD-10-CM | POA: Insufficient documentation

## 2014-08-26 DIAGNOSIS — Z79899 Other long term (current) drug therapy: Secondary | ICD-10-CM | POA: Insufficient documentation

## 2014-08-26 DIAGNOSIS — F329 Major depressive disorder, single episode, unspecified: Secondary | ICD-10-CM | POA: Diagnosis not present

## 2014-08-26 DIAGNOSIS — R5383 Other fatigue: Secondary | ICD-10-CM | POA: Insufficient documentation

## 2014-08-26 DIAGNOSIS — C8290 Follicular lymphoma, unspecified, unspecified site: Secondary | ICD-10-CM | POA: Diagnosis not present

## 2014-08-26 DIAGNOSIS — H9319 Tinnitus, unspecified ear: Secondary | ICD-10-CM | POA: Insufficient documentation

## 2014-08-26 DIAGNOSIS — R011 Cardiac murmur, unspecified: Secondary | ICD-10-CM | POA: Diagnosis not present

## 2014-08-26 DIAGNOSIS — Z9181 History of falling: Secondary | ICD-10-CM | POA: Diagnosis not present

## 2014-08-26 DIAGNOSIS — E780 Pure hypercholesterolemia: Secondary | ICD-10-CM | POA: Diagnosis not present

## 2014-08-26 DIAGNOSIS — E119 Type 2 diabetes mellitus without complications: Secondary | ICD-10-CM | POA: Diagnosis not present

## 2014-08-26 LAB — MAGNESIUM: Magnesium: 1.4 mg/dL — ABNORMAL LOW (ref 1.7–2.4)

## 2014-08-26 MED ORDER — HEPARIN SOD (PORK) LOCK FLUSH 100 UNIT/ML IV SOLN
500.0000 [IU] | Freq: Once | INTRAVENOUS | Status: AC
  Administered 2014-08-26: 500 [IU] via INTRAVENOUS
  Filled 2014-08-26: qty 5

## 2014-08-26 MED ORDER — SODIUM CHLORIDE 0.9 % IJ SOLN
10.0000 mL | INTRAMUSCULAR | Status: DC | PRN
  Administered 2014-08-26: 10 mL via INTRAVENOUS
  Filled 2014-08-26: qty 10

## 2014-08-26 MED ORDER — MAGNESIUM SULFATE 2 GM/50ML IV SOLN
2.0000 g | Freq: Once | INTRAVENOUS | Status: AC
  Administered 2014-08-26: 2 g via INTRAVENOUS
  Filled 2014-08-26: qty 50

## 2014-09-01 ENCOUNTER — Telehealth: Payer: Self-pay | Admitting: *Deleted

## 2014-09-01 NOTE — Telephone Encounter (Signed)
Lmom to call our office. Time to schedule an echo (1 yr f/u).

## 2014-09-09 DIAGNOSIS — J910 Malignant pleural effusion: Secondary | ICD-10-CM | POA: Diagnosis not present

## 2014-09-11 ENCOUNTER — Telehealth: Payer: Self-pay | Admitting: *Deleted

## 2014-09-11 NOTE — Telephone Encounter (Signed)
Order entered to have lab and possible Mag infusion for next week. Left message on vm that scheduler will call with appt

## 2014-09-15 ENCOUNTER — Telehealth: Payer: Self-pay | Admitting: *Deleted

## 2014-09-15 NOTE — Telephone Encounter (Signed)
Pt having symptoms of low magnesium and would like appt on 8/25 to be moved to 8/23 to check labs and possibly get IVF. Message sent to scheduling to change appt and notify [name] with new date and time.

## 2014-09-16 ENCOUNTER — Inpatient Hospital Stay: Payer: Medicare Other

## 2014-09-16 ENCOUNTER — Other Ambulatory Visit: Payer: Medicare Other

## 2014-09-16 ENCOUNTER — Ambulatory Visit: Payer: Medicare Other | Admitting: Oncology

## 2014-09-16 ENCOUNTER — Ambulatory Visit: Payer: Medicare Other

## 2014-09-16 DIAGNOSIS — Z79899 Other long term (current) drug therapy: Secondary | ICD-10-CM | POA: Diagnosis not present

## 2014-09-16 DIAGNOSIS — C8290 Follicular lymphoma, unspecified, unspecified site: Secondary | ICD-10-CM | POA: Diagnosis not present

## 2014-09-16 DIAGNOSIS — C8590 Non-Hodgkin lymphoma, unspecified, unspecified site: Secondary | ICD-10-CM

## 2014-09-16 DIAGNOSIS — R5383 Other fatigue: Secondary | ICD-10-CM | POA: Diagnosis not present

## 2014-09-16 DIAGNOSIS — R531 Weakness: Secondary | ICD-10-CM | POA: Diagnosis not present

## 2014-09-16 DIAGNOSIS — R0602 Shortness of breath: Secondary | ICD-10-CM | POA: Diagnosis not present

## 2014-09-16 LAB — CBC WITH DIFFERENTIAL/PLATELET
BASOS ABS: 0.1 10*3/uL (ref 0–0.1)
Basophils Relative: 2 %
EOS ABS: 0.1 10*3/uL (ref 0–0.7)
Eosinophils Relative: 2 %
HCT: 39.5 % (ref 35.0–47.0)
Hemoglobin: 13 g/dL (ref 12.0–16.0)
Lymphocytes Relative: 32 %
Lymphs Abs: 1.1 10*3/uL (ref 1.0–3.6)
MCH: 28.3 pg (ref 26.0–34.0)
MCHC: 32.8 g/dL (ref 32.0–36.0)
MCV: 86.3 fL (ref 80.0–100.0)
Monocytes Absolute: 0.4 10*3/uL (ref 0.2–0.9)
Monocytes Relative: 11 %
Neutro Abs: 1.7 10*3/uL (ref 1.4–6.5)
Neutrophils Relative %: 53 %
Platelets: 122 10*3/uL — ABNORMAL LOW (ref 150–440)
RBC: 4.58 MIL/uL (ref 3.80–5.20)
RDW: 15 % — AB (ref 11.5–14.5)
WBC: 3.3 10*3/uL — AB (ref 3.6–11.0)

## 2014-09-16 LAB — COMPREHENSIVE METABOLIC PANEL
ALT: 11 U/L — AB (ref 14–54)
AST: 27 U/L (ref 15–41)
Albumin: 3.3 g/dL — ABNORMAL LOW (ref 3.5–5.0)
Alkaline Phosphatase: 81 U/L (ref 38–126)
Anion gap: 5 (ref 5–15)
BUN: 11 mg/dL (ref 6–20)
CHLORIDE: 101 mmol/L (ref 101–111)
CO2: 28 mmol/L (ref 22–32)
CREATININE: 0.83 mg/dL (ref 0.44–1.00)
Calcium: 8.2 mg/dL — ABNORMAL LOW (ref 8.9–10.3)
GFR, EST NON AFRICAN AMERICAN: 59 mL/min — AB (ref >60)
Glucose, Bld: 218 mg/dL — ABNORMAL HIGH (ref 65–99)
POTASSIUM: 3.5 mmol/L (ref 3.5–5.1)
SODIUM: 134 mmol/L — AB (ref 135–145)
Total Bilirubin: 0.5 mg/dL (ref 0.3–1.2)
Total Protein: 5.8 g/dL — ABNORMAL LOW (ref 6.5–8.1)

## 2014-09-16 LAB — MAGNESIUM: MAGNESIUM: 1.5 mg/dL — AB (ref 1.7–2.4)

## 2014-09-16 MED ORDER — HEPARIN SOD (PORK) LOCK FLUSH 100 UNIT/ML IV SOLN
500.0000 [IU] | Freq: Once | INTRAVENOUS | Status: AC
  Administered 2014-09-16: 500 [IU] via INTRAVENOUS
  Filled 2014-09-16: qty 5

## 2014-09-16 MED ORDER — SODIUM CHLORIDE 0.9 % IJ SOLN
10.0000 mL | INTRAMUSCULAR | Status: DC | PRN
  Administered 2014-09-16: 10 mL via INTRAVENOUS
  Filled 2014-09-16: qty 10

## 2014-09-16 MED ORDER — MAGNESIUM SULFATE 2 GM/50ML IV SOLN
2.0000 g | Freq: Once | INTRAVENOUS | Status: AC
  Administered 2014-09-16: 2 g via INTRAVENOUS
  Filled 2014-09-16: qty 50

## 2014-09-16 MED ORDER — SODIUM CHLORIDE 0.9 % IV SOLN
INTRAVENOUS | Status: DC
Start: 2014-09-16 — End: 2014-09-16
  Administered 2014-09-16: 11:00:00 via INTRAVENOUS
  Filled 2014-09-16: qty 1000

## 2014-09-18 ENCOUNTER — Other Ambulatory Visit: Payer: Medicare Other

## 2014-09-18 ENCOUNTER — Ambulatory Visit: Payer: Medicare Other

## 2014-09-18 ENCOUNTER — Ambulatory Visit: Payer: Medicare Other | Admitting: Oncology

## 2014-09-19 ENCOUNTER — Other Ambulatory Visit: Payer: Self-pay | Admitting: *Deleted

## 2014-09-19 DIAGNOSIS — C8590 Non-Hodgkin lymphoma, unspecified, unspecified site: Secondary | ICD-10-CM

## 2014-09-23 ENCOUNTER — Other Ambulatory Visit: Payer: Medicare Other

## 2014-09-23 ENCOUNTER — Ambulatory Visit: Payer: Medicare Other | Admitting: Oncology

## 2014-09-23 ENCOUNTER — Ambulatory Visit: Payer: Medicare Other

## 2014-09-24 ENCOUNTER — Inpatient Hospital Stay: Payer: Medicare Other

## 2014-09-24 ENCOUNTER — Inpatient Hospital Stay (HOSPITAL_BASED_OUTPATIENT_CLINIC_OR_DEPARTMENT_OTHER): Payer: Medicare Other | Admitting: Oncology

## 2014-09-24 VITALS — BP 118/69 | HR 53 | Temp 96.2°F | Wt 124.1 lb

## 2014-09-24 DIAGNOSIS — Z79899 Other long term (current) drug therapy: Secondary | ICD-10-CM

## 2014-09-24 DIAGNOSIS — C8590 Non-Hodgkin lymphoma, unspecified, unspecified site: Secondary | ICD-10-CM

## 2014-09-24 DIAGNOSIS — I10 Essential (primary) hypertension: Secondary | ICD-10-CM

## 2014-09-24 DIAGNOSIS — R531 Weakness: Secondary | ICD-10-CM | POA: Diagnosis not present

## 2014-09-24 DIAGNOSIS — E119 Type 2 diabetes mellitus without complications: Secondary | ICD-10-CM

## 2014-09-24 DIAGNOSIS — R011 Cardiac murmur, unspecified: Secondary | ICD-10-CM

## 2014-09-24 DIAGNOSIS — Z9181 History of falling: Secondary | ICD-10-CM

## 2014-09-24 DIAGNOSIS — C8290 Follicular lymphoma, unspecified, unspecified site: Secondary | ICD-10-CM | POA: Diagnosis not present

## 2014-09-24 DIAGNOSIS — E780 Pure hypercholesterolemia: Secondary | ICD-10-CM

## 2014-09-24 DIAGNOSIS — R0602 Shortness of breath: Secondary | ICD-10-CM

## 2014-09-24 DIAGNOSIS — R5383 Other fatigue: Secondary | ICD-10-CM | POA: Diagnosis not present

## 2014-09-24 DIAGNOSIS — Z800 Family history of malignant neoplasm of digestive organs: Secondary | ICD-10-CM

## 2014-09-24 DIAGNOSIS — H9319 Tinnitus, unspecified ear: Secondary | ICD-10-CM

## 2014-09-24 DIAGNOSIS — F329 Major depressive disorder, single episode, unspecified: Secondary | ICD-10-CM

## 2014-09-24 DIAGNOSIS — F419 Anxiety disorder, unspecified: Secondary | ICD-10-CM

## 2014-09-24 LAB — COMPREHENSIVE METABOLIC PANEL
ALBUMIN: 3.2 g/dL — AB (ref 3.5–5.0)
ALK PHOS: 77 U/L (ref 38–126)
ALT: 10 U/L — ABNORMAL LOW (ref 14–54)
ANION GAP: 7 (ref 5–15)
AST: 26 U/L (ref 15–41)
BILIRUBIN TOTAL: 0.6 mg/dL (ref 0.3–1.2)
BUN: 14 mg/dL (ref 6–20)
CALCIUM: 8.4 mg/dL — AB (ref 8.9–10.3)
CO2: 27 mmol/L (ref 22–32)
Chloride: 102 mmol/L (ref 101–111)
Creatinine, Ser: 0.73 mg/dL (ref 0.44–1.00)
GFR calc non Af Amer: >60 mL/min (ref >60)
GLUCOSE: 196 mg/dL — AB (ref 65–99)
POTASSIUM: 3.6 mmol/L (ref 3.5–5.1)
Sodium: 136 mmol/L (ref 135–145)
TOTAL PROTEIN: 5.7 g/dL — AB (ref 6.5–8.1)

## 2014-09-24 LAB — CBC WITH DIFFERENTIAL/PLATELET
Basophils Absolute: 0.1 10*3/uL (ref 0–0.1)
Basophils Relative: 2 %
Eosinophils Absolute: 0.1 10*3/uL (ref 0–0.7)
Eosinophils Relative: 2 %
HCT: 39.2 % (ref 35.0–47.0)
HEMOGLOBIN: 13 g/dL (ref 12.0–16.0)
LYMPHS ABS: 1 10*3/uL (ref 1.0–3.6)
MCH: 28.4 pg (ref 26.0–34.0)
MCHC: 33.2 g/dL (ref 32.0–36.0)
MCV: 85.6 fL (ref 80.0–100.0)
Monocytes Absolute: 0.3 10*3/uL (ref 0.2–0.9)
NEUTROS ABS: 1.4 10*3/uL (ref 1.4–6.5)
Platelets: 125 10*3/uL — ABNORMAL LOW (ref 150–440)
RBC: 4.58 MIL/uL (ref 3.80–5.20)
RDW: 15 % — ABNORMAL HIGH (ref 11.5–14.5)
WBC: 2.9 10*3/uL — ABNORMAL LOW (ref 3.6–11.0)

## 2014-09-24 LAB — MAGNESIUM: MAGNESIUM: 1.5 mg/dL — AB (ref 1.7–2.4)

## 2014-09-24 MED ORDER — MAGNESIUM SULFATE 2 GM/50ML IV SOLN
2.0000 g | Freq: Once | INTRAVENOUS | Status: AC
  Administered 2014-09-24: 2 g via INTRAVENOUS
  Filled 2014-09-24: qty 50

## 2014-09-24 MED ORDER — SODIUM CHLORIDE 0.9 % IV SOLN
Freq: Once | INTRAVENOUS | Status: AC
  Administered 2014-09-24: 10:00:00 via INTRAVENOUS
  Filled 2014-09-24: qty 1000

## 2014-09-24 MED ORDER — DIPHENHYDRAMINE HCL 25 MG PO CAPS
25.0000 mg | ORAL_CAPSULE | Freq: Once | ORAL | Status: AC
  Administered 2014-09-24: 25 mg via ORAL

## 2014-09-24 MED ORDER — SODIUM CHLORIDE 0.9 % IV SOLN: 375.0000 mg/m2 | Freq: Once | INTRAVENOUS | Status: DC

## 2014-09-24 MED ORDER — ACETAMINOPHEN 325 MG PO TABS
650.0000 mg | ORAL_TABLET | Freq: Once | ORAL | Status: AC
  Administered 2014-09-24: 650 mg via ORAL

## 2014-09-24 MED ORDER — ACETAMINOPHEN 325 MG PO TABS
ORAL_TABLET | ORAL | Status: AC
  Filled 2014-09-24: qty 2

## 2014-09-24 MED ORDER — SODIUM CHLORIDE 0.9 % IJ SOLN
10.0000 mL | INTRAMUSCULAR | Status: DC | PRN
  Administered 2014-09-24: 10 mL via INTRAVENOUS
  Filled 2014-09-24: qty 10

## 2014-09-24 MED ORDER — SODIUM CHLORIDE 0.9 % IV SOLN
375.0000 mg/m2 | Freq: Once | INTRAVENOUS | Status: AC
  Administered 2014-09-24: 600 mg via INTRAVENOUS
  Filled 2014-09-24: qty 50

## 2014-09-24 MED ORDER — HEPARIN SOD (PORK) LOCK FLUSH 100 UNIT/ML IV SOLN
500.0000 [IU] | Freq: Once | INTRAVENOUS | Status: AC
  Administered 2014-09-24: 500 [IU] via INTRAVENOUS
  Filled 2014-09-24: qty 5

## 2014-09-24 MED ORDER — DIPHENHYDRAMINE HCL 25 MG PO CAPS
ORAL_CAPSULE | ORAL | Status: AC
  Filled 2014-09-24: qty 1

## 2014-09-24 NOTE — Progress Notes (Signed)
Patient does have living will.  Former smoker. 

## 2014-09-26 ENCOUNTER — Other Ambulatory Visit: Payer: Self-pay | Admitting: Cardiovascular Disease

## 2014-09-26 DIAGNOSIS — I050 Rheumatic mitral stenosis: Secondary | ICD-10-CM

## 2014-09-26 DIAGNOSIS — H3531 Nonexudative age-related macular degeneration: Secondary | ICD-10-CM | POA: Diagnosis not present

## 2014-09-27 ENCOUNTER — Encounter: Payer: Self-pay | Admitting: Oncology

## 2014-09-27 NOTE — Progress Notes (Signed)
[place] @ [facility] [phone]) [phone]  Fax:([phone]) [place]: [birth year]  MR#: [id number]  ZHG#:[id number]  Patient Care Team: Arlis Porta., MD as PCP - General (Unknown Physician Specialty)  CHIEF COMPLAINT:  Chief Complaint  Patient presents with  . Follow-up    Oncology History   1. Biopsy of para-aortic lymph node is consistent with follicular lymphoma, clinically stage as 3A Diagnosis in July of 2015. Started on rituximab on July 31, 2013 2. Started on [place]  and Rituxan on November 04, 2013 3 maintenance rituximab started from April of 2016      Lymphoma   12/12/2013 Initial Diagnosis Lymphoma    Oncology Flowsheet 07/24/2014 09/24/2014  diphenhydrAMINE (BENADRYL) PO 25 mg 25 mg  riTUXimab (RITUXAN) IV 375 mg/m2 375 mg/m2    INTERVAL HISTORY: 81 year old lady with a history of follicular lymphoma recently fell down.  Patient did not lose any consciousness there was no history of syncopal attack or seizure activity.  Continues to feel weak.  Remains hypokalemic and hypomagnesemic.  Patient does not tolerate oral mag oxide.  Slow-Mag would be started.  Potassium supplements been planned.  No chills.  No fever.  No nausea. Jun 19, 2014 Patient came as an add on for evaluating intrascapular pain.  Pain is more when patient tries to cough.  No chills.  No fever. July 17, 2014 Patient is here for maintenance rituximab therapy.  Feeling better.  Continues to have problem with weakness.  Continues to have 200 cc of fluid drained.  From the Pleurx catheter hypomagnesemia persist No chills.  No fever. September 24, 2014 Patient is here for ongoing evaluation.  Mrs. of breath.  No abdominal pain.  Patient is on maintenance Rituxan therapy.  There HYPOMAGNESEMIA requiring magnesium infusion because of poor tolerance to oral magnesium tablet Thoracentesis use 300 cc of pleural drainage once a week. Patient recently had a problem with root canal and not  feeling too well because of the pain  REVIEW OF SYSTEMS:   Gen. status.  patient continues to go weak and tired.  Frequent fall.  HEENT: Head echymosis on the face following fall Lungs: Shortness of breath on exertion.  Cardiac: No chest pain.  Skin: No rash.  Multiple ecchymotic or area\neurological system: No headache.  No dizziness.  No other focal symptoms.  No lower extremity edema.  Abdomen: No abdominal pain.  No nausea.  No vomiting.  No diarrhea.  No rectal bleeding.. GU: No dysuria or hematuria Lower extremity no swelling. Musculoskeletal system intrascapular pain Neurological system: No occasional dizziness.  No other focal signs patienr Intrascapular pain associated with breathing and coughing Patient has a Pleurx catheter and had 200cc of pleural fluid removed As per HPI. Otherwise, a complete review of systems is negatve.  PAST MEDICAL HISTORY: Past Medical History  Diagnosis Date  . HTN (hypertension)   . DM2 (diabetes mellitus, type 2)     insulin requiring  . Hypercholesterolemia   . Murmur     hx  . Tinnitus     chronic  . Macular degeneration   . Chronic chest pain     a. nonobs cath 2002.  Marland Kitchen Palpitations     a. 01/2011 Holter monitor showed frequent PACs, sinus bradycardia  . Anxiety   . Depression   . Irregular cardiac rhythm   . Heart murmur   . Cataract   . Non Hodgkin's lymphoma   . Hypomagnesemia 06/13/2014    PAST  SURGICAL HISTORY: Past Surgical History  Procedure Laterality Date  . Cardiac catheterization  2002  . Tumor removal  2012    benign neck tumor removed  . Tooth extraction    . Knee surgery Left   . Ankle surgery Left   . Partial hysterectomy    . Insertion / placement pleural catheter      FAMILY HISTORY Family History  Problem Relation Age of Onset  . Heart disease Mother   . Lung cancer Brother     smoked  . Bladder Cancer Brother   . Stomach cancer Brother       ADVANCED DIRECTIVES: Patient does have living  will   HEALTH MAINTENANCE: Social History  Substance Use Topics  . Smoking status: Former Smoker -- 0.25 packs/day for 7 years    Types: Cigarettes    Quit date: 01/25/1988  . Smokeless tobacco: Never Used  . Alcohol Use: No       Allergies  Allergen Reactions  . Moxifloxacin Shortness Of Breath    hallucinations  . Codeine Other (See Comments)    unknown  . Penicillins     Itching  Rash   . Prednisone     Hallucinations  . Unasyn [Ampicillin-Sulbactam Sodium] Other (See Comments)    unknown    Current Outpatient Prescriptions  Medication Sig Dispense Refill  . ALPRAZolam (XANAX) 0.25 MG tablet Take 0.25 mg by mouth every 6 (six) hours as needed.     . benzonatate (TESSALON) 100 MG capsule Take 100 mg by mouth 3 (three) times daily as needed.   0  . cyclobenzaprine (FLEXERIL) 10 MG tablet Take 10 mg by mouth 2 (two) times daily as needed for muscle spasms.    Marland Kitchen docusate sodium (COLACE) 100 MG capsule TAKE ONE CAPSULE BY MOUTH TWICE A DAY 60 capsule 3  . fluticasone (FLONASE) 50 MCG/ACT nasal spray Place 1 spray into both nostrils daily.    Marland Kitchen lactulose (CHRONULAC) 10 GM/15ML solution 10 g 2 (two) times daily as needed.   0  . lidocaine-prilocaine (EMLA) cream Apply 1 application topically as needed. 30 g 3  . loratadine (CLARITIN) 10 MG tablet Take 10 mg by mouth daily.    Marland Kitchen losartan (COZAAR) 100 MG tablet Take 1 tablet (100 mg total) by mouth daily. 90 tablet 3  . magnesium chloride (SLOW-MAG) 64 MG TBEC SR tablet Take 1 tablet (64 mg total) by mouth 2 (two) times daily. 60 tablet 3  . meclizine (ANTIVERT) 12.5 MG tablet Take 12.5 mg by mouth 3 (three) times daily as needed.    . metoCLOPramide (REGLAN) 10 MG tablet Take 10 mg by mouth 4 (four) times daily.    . metoprolol succinate (TOPROL-XL) 25 MG 24 hr tablet   6  . Multiple Vitamin (MULTIVITAMIN WITH MINERALS) TABS tablet Take 1 tablet by mouth daily.    . nitroGLYCERIN (NITROSTAT) 0.4 MG SL tablet Place 0.4 mg  under the tongue every 5 (five) minutes as needed.      . pantoprazole (PROTONIX) 40 MG tablet Take 1 tablet (40 mg total) by mouth daily. (Patient taking differently: Take 40 mg by mouth at bedtime. ) 90 tablet 3  . polyethylene glycol powder (GLYCOLAX/MIRALAX) powder Take 17 g by mouth daily as needed.   3  . potassium chloride (KLOR-CON) 20 MEQ packet Take 20 mEq by mouth daily. 30 packet 3  . azithromycin (ZITHROMAX) 500 MG tablet Take 1 tablet (500 mg total) by mouth daily. (Patient not taking:  Reported on 09/24/2014) 3 tablet 0  . Diphenhyd-Hydrocort-Nystatin (FIRST-DUKES MOUTHWASH MT)   2  . nystatin (MYCOSTATIN) 100000 UNIT/ML suspension   98   No current facility-administered medications for this visit.   Facility-Administered Medications Ordered in Other Visits  Medication Dose Route Frequency Provider Last Rate Last Dose  . heparin lock flush 100 unit/mL  500 Units Intravenous Once Forest Gleason, MD   500 Units at 07/24/14 1630  . sodium chloride 0.9 % injection 10 mL  10 mL Intravenous PRN Forest Gleason, MD   10 mL at 07/24/14 1020  . sodium chloride 0.9 % injection 10 mL  10 mL Intracatheter PRN Forest Gleason, MD   10 mL at 07/24/14 1020  . sodium chloride 0.9 % injection 10 mL  10 mL Intravenous PRN Forest Gleason, MD   10 mL at 08/12/14 0834    OBJECTIVE:  Filed Vitals:   09/24/14 0944  BP: 118/69  Pulse: 53  Temp: 96.2 F (35.7 C)     Body mass index is 22.7 kg/(m^2).    ECOG FS:2 - Symptomatic, <50% confined to bed  PHYSICAL EXAM: Gen. status: Patient is in wheelchair.  Lymphatic system: No palpable supraclavicular cervical axillary inguinal adenopathy.  Lungs: Diminished air entry on both sides.  No rhonchi.  No rales.  Patient does have Pleurx catheter still draining well pleural rub  Cardiac: Normal heart sounds.  Soft systolic murmur. Lymphatic system: Supraclavicular, cervical, axillary, inguinal lymph nodes are not palpable Abdomen: Soft.  No palpable masses no  tenderness no ascites liver and spleen not palpable. Lower extremity trace edema Skin: Multiple ecchymosis Neurological system: Higher functions within normal limit.  Cranial nerves are intact.  Motor and sensory systems within normal limit All other systems were examined HEENT: No abnormality HEENT: No evidence of any infection in the gums or teeth.  No soreness in the mouth Cardiac: No case no irregular heart sounds   LAB RESULTS:  Infusion on 09/24/2014  Component Date Value Ref Range Status  . WBC 09/24/2014 2.9* 3.6 - 11.0 K/uL Final   A-LINE DRAW  . RBC 09/24/2014 4.58  3.80 - 5.20 MIL/uL Final  . Hemoglobin 09/24/2014 13.0  12.0 - 16.0 g/dL Final  . HCT 09/24/2014 39.2  35.0 - 47.0 % Final  . MCV 09/24/2014 85.6  80.0 - 100.0 fL Final  . MCH 09/24/2014 28.4  26.0 - 34.0 pg Final  . MCHC 09/24/2014 33.2  32.0 - 36.0 g/dL Final  . RDW 09/24/2014 15.0* 11.5 - 14.5 % Final  . Platelets 09/24/2014 125* 150 - 440 K/uL Final  . Neutrophils Relative % 09/24/2014 50%   Final  . Neutro Abs 09/24/2014 1.4  1.4 - 6.5 K/uL Final  . Lymphocytes Relative 09/24/2014 35%   Final  . Lymphs Abs 09/24/2014 1.0  1.0 - 3.6 K/uL Final  . Monocytes Relative 09/24/2014 11%   Final  . Monocytes Absolute 09/24/2014 0.3  0.2 - 0.9 K/uL Final  . Eosinophils Relative 09/24/2014 2%   Final  . Eosinophils Absolute 09/24/2014 0.1  0 - 0.7 K/uL Final  . Basophils Relative 09/24/2014 2%   Final  . Basophils Absolute 09/24/2014 0.1  0 - 0.1 K/uL Final  . Smear Review 09/24/2014 SMEAR SCANNED   Corrected  . Sodium 09/24/2014 136  135 - 145 mmol/L Final  . Potassium 09/24/2014 3.6  3.5 - 5.1 mmol/L Final  . Chloride 09/24/2014 102  101 - 111 mmol/L Final  . CO2 09/24/2014 27  22 -  32 mmol/L Final  . Glucose, Bld 09/24/2014 196* 65 - 99 mg/dL Final  . BUN 09/24/2014 14  6 - 20 mg/dL Final  . Creatinine, Ser 09/24/2014 0.73  0.44 - 1.00 mg/dL Final  . Calcium 09/24/2014 8.4* 8.9 - 10.3 mg/dL Final  . Total  Protein 09/24/2014 5.7* 6.5 - 8.1 g/dL Final  . Albumin 09/24/2014 3.2* 3.5 - 5.0 g/dL Final  . AST 09/24/2014 26  15 - 41 U/L Final  . ALT 09/24/2014 10* 14 - 54 U/L Final  . Alkaline Phosphatase 09/24/2014 77  38 - 126 U/L Final  . Total Bilirubin 09/24/2014 0.6  0.3 - 1.2 mg/dL Final  . GFR calc non Af Amer 09/24/2014 >60  >60 mL/min Final  . GFR calc Af Amer 09/24/2014 >60  >60 mL/min Final   Comment: (NOTE) The eGFR has been calculated using the CKD EPI equation. This calculation has not been validated in all clinical situations. eGFR's persistently <60 mL/min signify possible Chronic Kidney Disease.   . Anion gap 09/24/2014 7  5 - 15 Final  . Magnesium 09/24/2014 1.5* 1.7 - 2.4 mg/dL Final       ASSESSMENT: 81 year old lady with follicular lymphoma appears to be in remission on maintenance Rituxan therapy Hypokinemia Hypomagnesemia Will continue IV magnesium supplement as patient has poor tolerance to oral magnesium Continue maintenance rituximab therapy every 2 months  MEDICAL DECISION MAKING:  All lab data has been reviewed.  Will continue to supplement intravenous technetium.  Overall potassium has been started. Continue maintenance Rituxan  PT PET scan in 7 weeks for restaging Continue monitoring magnesium and if needed IV magnesium will be given  Patient expressed understanding and was in agreement with this plan. She also understands that She can call clinic at any time with any questions, concerns, or complaints.    No matching staging information was found for the patient.  Forest Gleason, MD   09/27/2014 9:49 AM    .:  .

## 2014-09-30 ENCOUNTER — Ambulatory Visit (INDEPENDENT_AMBULATORY_CARE_PROVIDER_SITE_OTHER): Payer: Medicare Other

## 2014-09-30 ENCOUNTER — Other Ambulatory Visit: Payer: Self-pay

## 2014-09-30 DIAGNOSIS — I050 Rheumatic mitral stenosis: Secondary | ICD-10-CM

## 2014-10-06 DIAGNOSIS — H538 Other visual disturbances: Secondary | ICD-10-CM | POA: Diagnosis not present

## 2014-10-07 ENCOUNTER — Telehealth: Payer: Self-pay | Admitting: *Deleted

## 2014-10-07 NOTE — Telephone Encounter (Signed)
Agrees to appt at 115 tomorrow. C/o low back pain and voiding a lot last night, denies foul smell or burning. Will wait to see how she feels tomorrow and let us know if they want her urine checked when she comes in for lab

## 2014-10-07 NOTE — Telephone Encounter (Signed)
Asking if she can have her Mg+ level checked as her appetite is down and she is weaker

## 2014-10-08 ENCOUNTER — Inpatient Hospital Stay: Payer: Medicare Other | Attending: Oncology

## 2014-10-08 ENCOUNTER — Inpatient Hospital Stay: Payer: Medicare Other

## 2014-10-08 DIAGNOSIS — Z79899 Other long term (current) drug therapy: Secondary | ICD-10-CM | POA: Diagnosis not present

## 2014-10-08 DIAGNOSIS — C8590 Non-Hodgkin lymphoma, unspecified, unspecified site: Secondary | ICD-10-CM | POA: Diagnosis not present

## 2014-10-08 LAB — MAGNESIUM: Magnesium: 1.6 mg/dL — ABNORMAL LOW (ref 1.7–2.4)

## 2014-10-08 MED ORDER — SODIUM CHLORIDE 0.9 % IJ SOLN
10.0000 mL | Freq: Once | INTRAMUSCULAR | Status: AC
  Administered 2014-10-08: 10 mL via INTRAVENOUS
  Filled 2014-10-08: qty 10

## 2014-10-08 MED ORDER — HEPARIN SOD (PORK) LOCK FLUSH 100 UNIT/ML IV SOLN
500.0000 [IU] | Freq: Once | INTRAVENOUS | Status: AC
  Administered 2014-10-08: 500 [IU] via INTRAVENOUS
  Filled 2014-10-08: qty 5

## 2014-10-08 MED ORDER — MAGNESIUM SULFATE 2 GM/50ML IV SOLN
2.0000 g | Freq: Once | INTRAVENOUS | Status: AC
  Administered 2014-10-08: 2 g via INTRAVENOUS
  Filled 2014-10-08: qty 50

## 2014-10-08 MED ORDER — SODIUM CHLORIDE 0.9 % IV SOLN
INTRAVENOUS | Status: DC
  Administered 2014-10-08: 15:00:00 via INTRAVENOUS
  Filled 2014-10-08: qty 1000

## 2014-10-13 DIAGNOSIS — J910 Malignant pleural effusion: Secondary | ICD-10-CM | POA: Diagnosis not present

## 2014-10-14 ENCOUNTER — Telehealth: Payer: Self-pay | Admitting: *Deleted

## 2014-10-14 NOTE — Telephone Encounter (Signed)
Pt daughter calling stating that pt is having bad mornings Pt c/o BP issue: STAT if pt c/o blurred vision, one-sided weakness or slurred speech  1. What are your last 5 BP readings?  10/13/14 188/72 10/11/14 114/74 10/05/14 124/52 HR   2. Are you having any other symptoms (ex. Dizziness, headache, blurred vision, passed out)?   Feels like she will pass out.   3. What is your BP issue? She wakes up pt says shes weak, they eat breakfest and then she feels like she going to pass out.  Then trys to help pt to eat but doesn't eat much, has to lay down until the weakness wears off.  Blood sugar is normal. Going on for about a week

## 2014-10-14 NOTE — Telephone Encounter (Signed)
Spoke w/ pt.  She reports that she feels weak in the morning when she wakes up and has difficulty seeing.  She does have macular degeneration, but feels that this is r/t her BP.   Pt's daughter got on the phone and reports that sx have been going on for about a week. Reports that pt can feel episode coming on and feels that she is going to pass out.  Reports that pt reports a funny feeling in her head before episode. Daughter will have her sit down and she will feel better.  She reports that pt is concerned about her ECHO findings, as her niece told her that someone died from aortic valve stenosis.   Daughter is concerned that pt's BP is dropping in the am and causing presyncopal symptoms.  Dr. Ma Hillock d/c'd metoprolol, but her heart would race in the am, so she resumed. She has felt great until last week.  She reports that pt does not eat or drink much and does not wear compression hose.   Advised her to have pt increase her fluids, lie down w/ feet elevated when she feels episode coming on.  She will have pt resume wearing TED hose. She would like to try this before making any med changes.  Asked her to call back if sx continue.

## 2014-10-21 ENCOUNTER — Telehealth: Payer: Self-pay

## 2014-10-21 NOTE — Telephone Encounter (Signed)
Pt daughter called, states pt is still experiencing dizziness, lightheadedness. Please call.

## 2014-10-21 NOTE — Telephone Encounter (Signed)
Please see previous phone note.  

## 2014-10-21 NOTE — Telephone Encounter (Signed)
Blain Pais at 10/21/2014 10:59 AM             Pt daughter called, states pt is still experiencing dizziness, lightheadedness. Please call.

## 2014-10-21 NOTE — Telephone Encounter (Signed)
Notes indicate she is on losartan 100 mg daily, metoprolol When does she take her losartan?  Perhaps try taking both medications at lunch time Need blood pressure measurements when she does not feel well in the mornings If blood pressure is within a reasonable range, unclear what could be happening

## 2014-10-22 ENCOUNTER — Inpatient Hospital Stay: Payer: Medicare Other

## 2014-10-22 ENCOUNTER — Other Ambulatory Visit: Payer: Self-pay | Admitting: Cardiovascular Disease

## 2014-10-22 DIAGNOSIS — C8590 Non-Hodgkin lymphoma, unspecified, unspecified site: Secondary | ICD-10-CM

## 2014-10-22 DIAGNOSIS — Z79899 Other long term (current) drug therapy: Secondary | ICD-10-CM | POA: Diagnosis not present

## 2014-10-22 LAB — CBC WITH DIFFERENTIAL/PLATELET
BASOS ABS: 0 10*3/uL (ref 0–0.1)
BASOS PCT: 1 %
Eosinophils Absolute: 0.1 10*3/uL (ref 0–0.7)
Eosinophils Relative: 2 %
HEMATOCRIT: 39.8 % (ref 35.0–47.0)
HEMOGLOBIN: 13.2 g/dL (ref 12.0–16.0)
LYMPHS PCT: 51 %
Lymphs Abs: 1.3 10*3/uL (ref 1.0–3.6)
MCH: 28.6 pg (ref 26.0–34.0)
MCHC: 33.1 g/dL (ref 32.0–36.0)
MCV: 86.6 fL (ref 80.0–100.0)
MONO ABS: 0.4 10*3/uL (ref 0.2–0.9)
Monocytes Relative: 16 %
NEUTROS ABS: 0.8 10*3/uL — AB (ref 1.4–6.5)
NEUTROS PCT: 30 %
Platelets: 133 10*3/uL — ABNORMAL LOW (ref 150–440)
RBC: 4.6 MIL/uL (ref 3.80–5.20)
RDW: 15.5 % — ABNORMAL HIGH (ref 11.5–14.5)
WBC: 2.5 10*3/uL — AB (ref 3.6–11.0)

## 2014-10-22 LAB — COMPREHENSIVE METABOLIC PANEL
ALBUMIN: 3.3 g/dL — AB (ref 3.5–5.0)
ALK PHOS: 79 U/L (ref 38–126)
ALT: 10 U/L — ABNORMAL LOW (ref 14–54)
ANION GAP: 7 (ref 5–15)
AST: 24 U/L (ref 15–41)
BILIRUBIN TOTAL: 0.6 mg/dL (ref 0.3–1.2)
BUN: 12 mg/dL (ref 6–20)
CALCIUM: 8.2 mg/dL — AB (ref 8.9–10.3)
CO2: 26 mmol/L (ref 22–32)
Chloride: 100 mmol/L — ABNORMAL LOW (ref 101–111)
Creatinine, Ser: 0.84 mg/dL (ref 0.44–1.00)
GFR calc Af Amer: >60 mL/min (ref >60)
GFR calc non Af Amer: 58 mL/min — ABNORMAL LOW (ref >60)
GLUCOSE: 182 mg/dL — AB (ref 65–99)
POTASSIUM: 3.7 mmol/L (ref 3.5–5.1)
SODIUM: 133 mmol/L — AB (ref 135–145)
Total Protein: 5.8 g/dL — ABNORMAL LOW (ref 6.5–8.1)

## 2014-10-22 LAB — MAGNESIUM: MAGNESIUM: 1.5 mg/dL — AB (ref 1.7–2.4)

## 2014-10-22 MED ORDER — HEPARIN SOD (PORK) LOCK FLUSH 100 UNIT/ML IV SOLN
INTRAVENOUS | Status: AC
  Filled 2014-10-22: qty 5

## 2014-10-22 MED ORDER — MAGNESIUM SULFATE 2 GM/50ML IV SOLN
2.0000 g | Freq: Once | INTRAVENOUS | Status: AC
  Administered 2014-10-22: 2 g via INTRAVENOUS

## 2014-10-22 NOTE — Telephone Encounter (Signed)
Spoke w/ pt's daughter.  Advised her of Dr. Donivan Scull recommendation.  She is concerned, as pt has a digital monitor that gives inaccurate readings due to pt's irregular heartbeat. She will go on [employer] and purchase a manual BP cuff to check pt's readings when she is symptomatic.  Asked her to call back if we can be of further assistance.

## 2014-10-24 ENCOUNTER — Telehealth: Payer: Self-pay | Admitting: *Deleted

## 2014-10-24 NOTE — Telephone Encounter (Signed)
Please see previous note.

## 2014-10-24 NOTE — Telephone Encounter (Signed)
Jonette Eva at 10/24/2014 10:37 AM             Pt daughter calling stating she was told to call us when pt has another episode Lightheaded feelings, weak feeling, mainly in AM/and some in the PM 148/108 This morning Blood sugar 114   Pt felt like she was going to pass out.  Please advise.

## 2014-10-24 NOTE — Telephone Encounter (Signed)
Pt daughter calling stating she was told to call us when pt has another episode Lightheaded feelings, weak feeling, mainly in AM/and some in the PM 148/108 This morning Blood sugar 114   Pt felt like she was going to pass out.  Please advise.

## 2014-10-27 NOTE — Telephone Encounter (Signed)
Please refer to my last note: What are her meds and when is she taking them"  losartan 100 mg daily, metoprolol (on list twice)? When does she take each?  Stop losartan for now if she is taking this Stay on metop at night

## 2014-10-28 NOTE — Telephone Encounter (Signed)
Have asked scheduling to set up appt on Monday, as there is a cancellation.

## 2014-10-28 NOTE — Telephone Encounter (Signed)
Left message for Caroline Russo to call back

## 2014-11-03 ENCOUNTER — Encounter: Payer: Self-pay | Admitting: Cardiovascular Disease

## 2014-11-03 ENCOUNTER — Ambulatory Visit (INDEPENDENT_AMBULATORY_CARE_PROVIDER_SITE_OTHER): Payer: Medicare Other | Admitting: Cardiovascular Disease

## 2014-11-03 VITALS — BP 130/50 | HR 76 | Ht 62.5 in | Wt 122.8 lb

## 2014-11-03 DIAGNOSIS — I491 Atrial premature depolarization: Secondary | ICD-10-CM

## 2014-11-03 DIAGNOSIS — I10 Essential (primary) hypertension: Secondary | ICD-10-CM | POA: Diagnosis not present

## 2014-11-03 DIAGNOSIS — I951 Orthostatic hypotension: Secondary | ICD-10-CM

## 2014-11-03 DIAGNOSIS — E119 Type 2 diabetes mellitus without complications: Secondary | ICD-10-CM

## 2014-11-03 DIAGNOSIS — Z23 Encounter for immunization: Secondary | ICD-10-CM

## 2014-11-03 DIAGNOSIS — R0602 Shortness of breath: Secondary | ICD-10-CM

## 2014-11-03 NOTE — Assessment & Plan Note (Signed)
Managed by oncology. Dramatic weight loss over the past year, down 30 pounds Anorexia, no desire TEE to. Unable to exclude depression

## 2014-11-03 NOTE — Assessment & Plan Note (Signed)
Continue metoprolol, May need to decrease losartan given weight loss, possibly low blood pressure

## 2014-11-03 NOTE — Assessment & Plan Note (Signed)
Daughter reports blood pressure is low at times. Recommended she monitor her blood pressure at home when standing. If this runs low, would decrease losartan down to 50 mg daily

## 2014-11-03 NOTE — Assessment & Plan Note (Signed)
Asymptomatic APCs noted on EKG today No changes to her medications

## 2014-11-03 NOTE — Assessment & Plan Note (Signed)
Shortness of breath symptoms have significantly improved, does not appear to have large residual pleural effusion on clinical exam ( treated with Pleurx catheter)

## 2014-11-03 NOTE — Patient Instructions (Addendum)
You are doing well. No medication changes were made.  Ok to move losartan to evening if blood pressure runs low Or 1/2 pill twice a day Call the office if BP runs low, we would cut the losartan down to 1/2 pill daily  Take potassium powder daily  Please call us if you have new issues that need to be addressed before your next appt.  Your physician wants you to follow-up in: 6 months.  You will receive a reminder letter in the mail two months in advance. If you don't receive a letter, please call our office to schedule the follow-up appointment.

## 2014-11-03 NOTE — Progress Notes (Signed)
Patient ID: Caroline Russo, female    DOB: 09/24/1919, 81 y.o.   MRN: [id number]  HPI Comments: Caroline Russo is a pleasant 81 year old woman with hypertension, nonobstructive coronary artery disease in 2002, diabetes, obesity who presents for routine followup of her coronary artery disease, hypertension  Previous episodes of dizziness with previous Blood pressure measurements showing orthostasis.  HCTZ was held in the past.  In follow-up, she presents with her daughter Chemotherapy has been completed, she is on Rituxan every 3 months Plan for PET scan in the future Daughter reports chronic nausea in the morning, possibly better on proton pump inhibitor twice a day Not eating well, losing weight. Weight down from 158 pounds, now 122 pounds Daughter concerned about problems with her tongue and jaw, she is concerned about stroke. No facial droop, no significant asymmetry, able to talk okay, no weakness with her left arm, left leg Significant bruising of her legs Blood pressure low at times, denies any dizziness  EKG on today's visit shows normal sinus rhythm with rate 76 bpm, first-degree AV block, frequent APCs  Other past medical history  fall while in the bathroom 06/09/2014, hit her right eye History ofrecurrent pleural effusion, with Pleurx catheter.  Weight continues to trend lower, previously 158 pounds, down to 150 pounds,  130 pounds, now 122 pounds  Previously treated with Rituxan, then Treanda and Rituxan with steroids  In the hospital May 2015 CT scan showed moderate right pleural effusion, diffuse adenopathy CT scan of the head without metastases Hospital records from 11/11/2013 detail diagnosis of follicular lymphoma with chronic right-sided pleural effusion She was discharged from the hospital October 20, admission on October 17  Chronic problem with her vision. Hearing problem  She has macular degeneration.   Previous history of palpitations and event monitor was  performed that showed occasional sinus bradycardia., Normal sinus rhythm mostly with frequent APCs. No other significant arrhythmia   Allergies  Allergen Reactions  . Moxifloxacin Shortness Of Breath    hallucinations  . Codeine Other (See Comments)    unknown  . Penicillins     Itching  Rash   . Prednisone     Hallucinations  . Unasyn [Ampicillin-Sulbactam Sodium] Other (See Comments)    unknown    Current Outpatient Prescriptions on File Prior to Visit  Medication Sig Dispense Refill  . ALPRAZolam (XANAX) 0.25 MG tablet Take 0.25 mg by mouth every 6 (six) hours as needed.     . benzonatate (TESSALON) 100 MG capsule Take 100 mg by mouth 3 (three) times daily as needed.   0  . cyclobenzaprine (FLEXERIL) 10 MG tablet Take 10 mg by mouth 2 (two) times daily as needed for muscle spasms.    . Diphenhyd-Hydrocort-Nystatin (FIRST-DUKES MOUTHWASH MT)   2  . docusate sodium (COLACE) 100 MG capsule TAKE ONE CAPSULE BY MOUTH TWICE A DAY 60 capsule 3  . fluticasone (FLONASE) 50 MCG/ACT nasal spray Place 1 spray into both nostrils daily.    Marland Kitchen lactulose (CHRONULAC) 10 GM/15ML solution 10 g 2 (two) times daily as needed.   0  . lidocaine-prilocaine (EMLA) cream Apply 1 application topically as needed. 30 g 3  . losartan (COZAAR) 100 MG tablet Take 1 tablet (100 mg total) by mouth daily. 90 tablet 3  . magnesium chloride (SLOW-MAG) 64 MG TBEC SR tablet Take 1 tablet (64 mg total) by mouth 2 (two) times daily. 60 tablet 3  . meclizine (ANTIVERT) 12.5 MG tablet Take 12.5 mg by mouth 3 (  three) times daily as needed.    . metoCLOPramide (REGLAN) 10 MG tablet Take 10 mg by mouth 4 (four) times daily.    . metoprolol succinate (TOPROL-XL) 25 MG 24 hr tablet TAKE 1 TABLET (25 MG TOTAL) BY MOUTH 2 (TWO) TIMES DAILY. 60 tablet 6  . Multiple Vitamin (MULTIVITAMIN WITH MINERALS) TABS tablet Take 1 tablet by mouth daily.    . nitroGLYCERIN (NITROSTAT) 0.4 MG SL tablet Place 0.4 mg under the tongue every 5  (five) minutes as needed.      . pantoprazole (PROTONIX) 40 MG tablet Take 1 tablet (40 mg total) by mouth daily. (Patient taking differently: Take 40 mg by mouth at bedtime. ) 90 tablet 3  . polyethylene glycol powder (GLYCOLAX/MIRALAX) powder Take 17 g by mouth daily as needed.   3   Current Facility-Administered Medications on File Prior to Visit  Medication Dose Route Frequency Provider Last Rate Last Dose  . heparin lock flush 100 unit/mL  500 Units Intravenous Once Forest Gleason, MD   500 Units at 07/24/14 1630  . sodium chloride 0.9 % injection 10 mL  10 mL Intravenous PRN Forest Gleason, MD   10 mL at 07/24/14 1020  . sodium chloride 0.9 % injection 10 mL  10 mL Intracatheter PRN Forest Gleason, MD   10 mL at 07/24/14 1020  . sodium chloride 0.9 % injection 10 mL  10 mL Intravenous PRN Forest Gleason, MD   10 mL at 08/12/14 6256    Past Medical History  Diagnosis Date  . HTN (hypertension)   . DM2 (diabetes mellitus, type 2) (HCC)     insulin requiring  . Hypercholesterolemia   . Murmur     hx  . Tinnitus     chronic  . Macular degeneration   . Chronic chest pain     a. nonobs cath 2002.  Marland Kitchen Palpitations     a. 01/2011 Holter monitor showed frequent PACs, sinus bradycardia  . Anxiety   . Depression   . Irregular cardiac rhythm   . Heart murmur   . Cataract   . Non Hodgkin's lymphoma ([place])   . Hypomagnesemia 06/13/2014    Past Surgical History  Procedure Laterality Date  . Cardiac catheterization  2002  . Tumor removal  2012    benign neck tumor removed  . Tooth extraction    . Knee surgery Left   . Ankle surgery Left   . Partial hysterectomy    . Insertion / placement pleural catheter      Social History  reports that she quit smoking about 26 years ago. Her smoking use included Cigarettes. She has a 1.75 pack-year smoking history. She has never used smokeless tobacco. She reports that she does not drink alcohol or use illicit drugs.  Family History family history  includes Bladder Cancer in her brother; Heart disease in her mother; Lung cancer in her brother; Stomach cancer in her brother.  Review of Systems  Constitutional: Positive for fatigue and unexpected weight change.  Respiratory: Negative.   Cardiovascular: Negative.   Gastrointestinal: Negative.   Musculoskeletal: Positive for gait problem.  Skin: Negative.   Allergic/Immunologic: Negative.   Neurological: Positive for weakness.  Psychiatric/Behavioral: Negative.   All other systems reviewed and are negative.   BP 130/50 mmHg  Pulse 76  Ht 5' 2.5" (1.588 m)  Wt 122 lb 12 oz (55.679 kg)  BMI 22.08 kg/m2  Physical Exam  Constitutional: She is oriented to person, place, and time.  HENT:  Head: Normocephalic.  Nose: Nose normal.  Mouth/Throat: Oropharynx is clear and moist.  Eyes: Conjunctivae are normal. Pupils are equal, round, and reactive to light.  Neck: Normal range of motion. Neck supple. No JVD present.  Cardiovascular: Normal rate, regular rhythm, S1 normal, S2 normal, normal heart sounds and intact distal pulses.  Exam reveals no gallop and no friction rub.   No murmur heard. Pulmonary/Chest: Effort normal and breath sounds normal. No respiratory distress. She has no wheezes. She has no rales. She exhibits no tenderness.  Abdominal: Soft. Bowel sounds are normal. She exhibits no distension. There is no tenderness.  Musculoskeletal: Normal range of motion. She exhibits no edema or tenderness.  Lymphadenopathy:    She has no cervical adenopathy.  Neurological: She is alert and oriented to person, place, and time. Coordination normal.  Skin: Skin is warm and dry. No rash noted. No erythema.  Ecchymoses around her right eye  Psychiatric: She has a normal mood and affect. Her behavior is normal. Judgment and thought content normal.    Assessment and Plan  Nursing note and vitals reviewed.

## 2014-11-03 NOTE — Assessment & Plan Note (Signed)
Glucose continues to run high even despite dramatic weight loss. Managed by primary care She is not seeing Dr. Luan Pulling in some time. Recommended she arrange follow-up

## 2014-11-05 ENCOUNTER — Other Ambulatory Visit: Payer: Self-pay | Admitting: Oncology

## 2014-11-05 MED ORDER — ALPRAZOLAM 0.25 MG PO TABS: 0.2500 mg | ORAL_TABLET | Freq: Four times a day (QID) | ORAL | Status: DC | PRN

## 2014-11-05 NOTE — Telephone Encounter (Signed)
faxed

## 2014-11-12 ENCOUNTER — Ambulatory Visit: Admission: RE | Admit: 2014-11-12 | Payer: Medicare Other | Source: Ambulatory Visit

## 2014-11-14 ENCOUNTER — Other Ambulatory Visit: Payer: Self-pay | Admitting: *Deleted

## 2014-11-14 DIAGNOSIS — C8590 Non-Hodgkin lymphoma, unspecified, unspecified site: Secondary | ICD-10-CM

## 2014-11-17 ENCOUNTER — Ambulatory Visit
Admission: RE | Admit: 2014-11-17 | Discharge: 2014-11-17 | Disposition: A | Discharge status: Home / Self Care | Payer: Medicare Other | Source: Ambulatory Visit | Attending: Oncology | Admitting: Oncology

## 2014-11-17 DIAGNOSIS — R590 Localized enlarged lymph nodes: Secondary | ICD-10-CM | POA: Insufficient documentation

## 2014-11-17 DIAGNOSIS — C8590 Non-Hodgkin lymphoma, unspecified, unspecified site: Secondary | ICD-10-CM | POA: Insufficient documentation

## 2014-11-17 DIAGNOSIS — Z08 Encounter for follow-up examination after completed treatment for malignant neoplasm: Secondary | ICD-10-CM | POA: Diagnosis not present

## 2014-11-17 MED ORDER — FLUDEOXYGLUCOSE F - 18 (FDG) INJECTION
12.1300 | Freq: Once | INTRAVENOUS | Status: DC | PRN
  Administered 2014-11-17: 12.13 via INTRAVENOUS
  Filled 2014-11-17: qty 12.13

## 2014-11-19 ENCOUNTER — Inpatient Hospital Stay: Payer: Medicare Other | Attending: Oncology | Admitting: Oncology

## 2014-11-19 ENCOUNTER — Inpatient Hospital Stay: Payer: Medicare Other

## 2014-11-19 ENCOUNTER — Encounter: Payer: Self-pay | Admitting: Oncology

## 2014-11-19 VITALS — BP 150/76 | HR 60 | Temp 97.1°F | Resp 20

## 2014-11-19 VITALS — BP 130/55 | HR 74 | Temp 95.8°F | Wt 121.0 lb

## 2014-11-19 DIAGNOSIS — R5383 Other fatigue: Secondary | ICD-10-CM | POA: Diagnosis not present

## 2014-11-19 DIAGNOSIS — R011 Cardiac murmur, unspecified: Secondary | ICD-10-CM | POA: Insufficient documentation

## 2014-11-19 DIAGNOSIS — C8290 Follicular lymphoma, unspecified, unspecified site: Secondary | ICD-10-CM | POA: Insufficient documentation

## 2014-11-19 DIAGNOSIS — Z794 Long term (current) use of insulin: Secondary | ICD-10-CM | POA: Diagnosis not present

## 2014-11-19 DIAGNOSIS — H9319 Tinnitus, unspecified ear: Secondary | ICD-10-CM | POA: Insufficient documentation

## 2014-11-19 DIAGNOSIS — F419 Anxiety disorder, unspecified: Secondary | ICD-10-CM | POA: Diagnosis not present

## 2014-11-19 DIAGNOSIS — Z87891 Personal history of nicotine dependence: Secondary | ICD-10-CM | POA: Diagnosis not present

## 2014-11-19 DIAGNOSIS — E119 Type 2 diabetes mellitus without complications: Secondary | ICD-10-CM | POA: Diagnosis not present

## 2014-11-19 DIAGNOSIS — C8293 Follicular lymphoma, unspecified, intra-abdominal lymph nodes: Secondary | ICD-10-CM

## 2014-11-19 DIAGNOSIS — Z9181 History of falling: Secondary | ICD-10-CM | POA: Diagnosis not present

## 2014-11-19 DIAGNOSIS — Z5112 Encounter for antineoplastic immunotherapy: Secondary | ICD-10-CM | POA: Diagnosis not present

## 2014-11-19 DIAGNOSIS — E876 Hypokalemia: Secondary | ICD-10-CM | POA: Diagnosis not present

## 2014-11-19 DIAGNOSIS — I10 Essential (primary) hypertension: Secondary | ICD-10-CM

## 2014-11-19 DIAGNOSIS — E7800 Pure hypercholesterolemia, unspecified: Secondary | ICD-10-CM | POA: Diagnosis not present

## 2014-11-19 DIAGNOSIS — R531 Weakness: Secondary | ICD-10-CM | POA: Diagnosis not present

## 2014-11-19 DIAGNOSIS — C8590 Non-Hodgkin lymphoma, unspecified, unspecified site: Secondary | ICD-10-CM

## 2014-11-19 DIAGNOSIS — I499 Cardiac arrhythmia, unspecified: Secondary | ICD-10-CM | POA: Insufficient documentation

## 2014-11-19 DIAGNOSIS — F329 Major depressive disorder, single episode, unspecified: Secondary | ICD-10-CM | POA: Diagnosis not present

## 2014-11-19 DIAGNOSIS — H3530 Unspecified macular degeneration: Secondary | ICD-10-CM | POA: Insufficient documentation

## 2014-11-19 DIAGNOSIS — Z79899 Other long term (current) drug therapy: Secondary | ICD-10-CM | POA: Insufficient documentation

## 2014-11-19 LAB — MAGNESIUM: MAGNESIUM: 1.6 mg/dL — AB (ref 1.7–2.4)

## 2014-11-19 LAB — CBC WITH DIFFERENTIAL/PLATELET
BASOS PCT: 3 %
Basophils Absolute: 0.1 10*3/uL (ref 0–0.1)
EOS PCT: 3 %
Eosinophils Absolute: 0.1 10*3/uL (ref 0–0.7)
HEMATOCRIT: 41 % (ref 35.0–47.0)
HEMOGLOBIN: 13.6 g/dL (ref 12.0–16.0)
Lymphocytes Relative: 37 %
Lymphs Abs: 1.2 10*3/uL (ref 1.0–3.6)
MCH: 29 pg (ref 26.0–34.0)
MCHC: 33.2 g/dL (ref 32.0–36.0)
MCV: 87.4 fL (ref 80.0–100.0)
MONOS PCT: 13 %
Monocytes Absolute: 0.4 10*3/uL (ref 0.2–0.9)
NEUTROS PCT: 44 %
Neutro Abs: 1.4 10*3/uL (ref 1.4–6.5)
Platelets: 140 10*3/uL — ABNORMAL LOW (ref 150–440)
RBC: 4.7 MIL/uL (ref 3.80–5.20)
RDW: 14.6 % — ABNORMAL HIGH (ref 11.5–14.5)
WBC: 3.2 10*3/uL — AB (ref 3.6–11.0)

## 2014-11-19 LAB — COMPREHENSIVE METABOLIC PANEL
ALK PHOS: 75 U/L (ref 38–126)
ALT: 10 U/L — AB (ref 14–54)
AST: 29 U/L (ref 15–41)
Albumin: 3.4 g/dL — ABNORMAL LOW (ref 3.5–5.0)
Anion gap: 5 (ref 5–15)
BILIRUBIN TOTAL: 0.5 mg/dL (ref 0.3–1.2)
BUN: 14 mg/dL (ref 6–20)
CALCIUM: 8.2 mg/dL — AB (ref 8.9–10.3)
CHLORIDE: 101 mmol/L (ref 101–111)
CO2: 25 mmol/L (ref 22–32)
CREATININE: 0.9 mg/dL (ref 0.44–1.00)
GFR, EST NON AFRICAN AMERICAN: 53 mL/min — AB (ref >60)
Glucose, Bld: 200 mg/dL — ABNORMAL HIGH (ref 65–99)
Potassium: 4 mmol/L (ref 3.5–5.1)
Sodium: 131 mmol/L — ABNORMAL LOW (ref 135–145)
Total Protein: 6 g/dL — ABNORMAL LOW (ref 6.5–8.1)

## 2014-11-19 MED ORDER — MAGNESIUM SULFATE 4 GM/100ML IV SOLN: 4.0000 g | Freq: Once | INTRAVENOUS | Status: DC

## 2014-11-19 MED ORDER — HEPARIN SOD (PORK) LOCK FLUSH 100 UNIT/ML IV SOLN
500.0000 [IU] | Freq: Once | INTRAVENOUS | Status: AC | PRN
  Administered 2014-11-19: 500 [IU]
  Filled 2014-11-19: qty 5

## 2014-11-19 MED ORDER — ACETAMINOPHEN 325 MG PO TABS
650.0000 mg | ORAL_TABLET | Freq: Once | ORAL | Status: AC
  Administered 2014-11-19: 650 mg via ORAL
  Filled 2014-11-19: qty 2

## 2014-11-19 MED ORDER — RITUXIMAB CHEMO INJECTION 500 MG/50ML
375.0000 mg/m2 | Freq: Once | INTRAVENOUS | Status: DC
Start: 2014-11-19 — End: 2014-11-19

## 2014-11-19 MED ORDER — MAGNESIUM SULFATE 2 GM/50ML IV SOLN
2.0000 g | Freq: Once | INTRAVENOUS | Status: AC
  Administered 2014-11-19: 2 g via INTRAVENOUS
  Filled 2014-11-19: qty 50

## 2014-11-19 MED ORDER — DIPHENHYDRAMINE HCL 25 MG PO CAPS
25.0000 mg | ORAL_CAPSULE | Freq: Once | ORAL | Status: AC
  Administered 2014-11-19: 25 mg via ORAL
  Filled 2014-11-19: qty 1

## 2014-11-19 MED ORDER — SODIUM CHLORIDE 0.9 % IV SOLN
375.0000 mg/m2 | Freq: Once | INTRAVENOUS | Status: AC
  Administered 2014-11-19: 600 mg via INTRAVENOUS
  Filled 2014-11-19: qty 50

## 2014-11-19 MED ORDER — SODIUM CHLORIDE 0.9 % IV SOLN
Freq: Once | INTRAVENOUS | Status: AC
  Administered 2014-11-19: 10:00:00 via INTRAVENOUS
  Filled 2014-11-19: qty 1000

## 2014-11-19 NOTE — Progress Notes (Signed)
[place] @ [facility] Telephone:(336) [phone]  Fax:(336) [place]: Aug 08, 1919  MR#: [id number]  KGU#:[id number]  Patient Care Team: Arlis Porta., MD as PCP - General (Unknown Physician Specialty)  CHIEF COMPLAINT:  Chief Complaint  Patient presents with  . OTHER    Oncology History   1. Biopsy of para-aortic lymph node is consistent with follicular lymphoma, clinically stage as 3A Diagnosis in July of 2015. Started on rituximab on July 31, 2013 2. Started on [place]  and Rituxan on November 04, 2013 3 maintenance rituximab started from April of 2016      Lymphoma [facility])   12/12/2013 Initial Diagnosis Lymphoma    Oncology Flowsheet 07/24/2014 09/24/2014 11/19/2014  diphenhydrAMINE (BENADRYL) PO 25 mg 25 mg 25 mg  riTUXimab (RITUXAN) IV 375 mg/m2 375 mg/m2 375 mg/m2    INTERVAL HISTORY: 81 year old lady with a history of follicular lymphoma recently fell down.  Patient did not lose any consciousness there was no history of syncopal attack or seizure activity.  Continues to feel weak.  Remains hypokalemic and hypomagnesemic.  Patient does not tolerate oral mag oxide.  Slow-Mag would be started.  Potassium supplements been planned.  No chills.  No fever.  No nausea. Patient is here for further evaluation regarding lymphoma.  Had a PET scan done.  Patient remains pain-free.  Rule or drainage also's decreased. Continues to a problem with the teeth.  REVIEW OF SYSTEMS:   Gen. status.  patient continues to go weak and tired.  Frequent fall.  HEENT: Head echymosis on the face following fall Lungs: Shortness of breath on exertion.  Cardiac: No chest pain.  Skin: No rash.  Multiple ecchymotic or area\neurological system: No headache.  No dizziness.  No other focal symptoms.  No lower extremity edema.  Abdomen: No abdominal pain.  No nausea.  No vomiting.  No diarrhea.  No rectal bleeding.. GU: No dysuria or hematuria Lower extremity no swelling. Musculoskeletal  system intrascapular pain Neurological system: No occasional dizziness.  No other focal signs patienr Intrascapular pain associated with breathing and coughing Patient has a Pleurx catheter and had 200cc of pleural fluid removed As per HPI. Otherwise, a complete review of systems is negatve.  PAST MEDICAL HISTORY: Past Medical History  Diagnosis Date  . HTN (hypertension)   . DM2 (diabetes mellitus, type 2) (HCC)     insulin requiring  . Hypercholesterolemia   . Murmur     hx  . Tinnitus     chronic  . Macular degeneration   . Chronic chest pain     a. nonobs cath 2002.  Marland Kitchen Palpitations     a. 01/2011 Holter monitor showed frequent PACs, sinus bradycardia  . Anxiety   . Depression   . Irregular cardiac rhythm   . Heart murmur   . Cataract   . Non Hodgkin's lymphoma ([place])   . Hypomagnesemia 06/13/2014    PAST SURGICAL HISTORY: Past Surgical History  Procedure Laterality Date  . Cardiac catheterization  2002  . Tumor removal  2012    benign neck tumor removed  . Tooth extraction    . Knee surgery Left   . Ankle surgery Left   . Partial hysterectomy    . Insertion / placement pleural catheter      FAMILY HISTORY Family History  Problem Relation Age of Onset  . Heart disease Mother   . Lung cancer Brother     smoked  . Bladder Cancer Brother   .  Stomach cancer Brother       ADVANCED DIRECTIVES: Patient does have living will   HEALTH MAINTENANCE: Social History  Substance Use Topics  . Smoking status: Former Smoker -- 0.25 packs/day for 7 years    Types: Cigarettes    Quit date: 01/25/1988  . Smokeless tobacco: Never Used  . Alcohol Use: No       Allergies  Allergen Reactions  . Moxifloxacin Shortness Of Breath    hallucinations  . Codeine Other (See Comments)    unknown  . Penicillins     Itching  Rash   . Prednisone     Hallucinations  . Unasyn [Ampicillin-Sulbactam Sodium] Other (See Comments)    unknown    Current Outpatient  Prescriptions  Medication Sig Dispense Refill  . ALPRAZolam (XANAX) 0.25 MG tablet Take 1 tablet (0.25 mg total) by mouth every 6 (six) hours as needed. 30 tablet 1  . benzonatate (TESSALON) 100 MG capsule Take 100 mg by mouth 3 (three) times daily as needed.   0  . cyclobenzaprine (FLEXERIL) 10 MG tablet Take 10 mg by mouth 2 (two) times daily as needed for muscle spasms.    . Diphenhyd-Hydrocort-Nystatin (FIRST-DUKES MOUTHWASH MT)   2  . docusate sodium (COLACE) 100 MG capsule TAKE ONE CAPSULE BY MOUTH TWICE A DAY 60 capsule 3  . fluticasone (FLONASE) 50 MCG/ACT nasal spray Place 1 spray into both nostrils daily.    Marland Kitchen lactulose (CHRONULAC) 10 GM/15ML solution 10 g 2 (two) times daily as needed.   0  . lidocaine-prilocaine (EMLA) cream Apply 1 application topically as needed. 30 g 3  . losartan (COZAAR) 100 MG tablet Take 1 tablet (100 mg total) by mouth daily. (Patient taking differently: Take 50 mg by mouth daily. ) 90 tablet 3  . magnesium chloride (SLOW-MAG) 64 MG TBEC SR tablet Take 1 tablet (64 mg total) by mouth 2 (two) times daily. 60 tablet 3  . meclizine (ANTIVERT) 12.5 MG tablet Take 12.5 mg by mouth 3 (three) times daily as needed.    . metoCLOPramide (REGLAN) 10 MG tablet Take 10 mg by mouth 4 (four) times daily.    . metoprolol succinate (TOPROL-XL) 25 MG 24 hr tablet TAKE 1 TABLET (25 MG TOTAL) BY MOUTH 2 (TWO) TIMES DAILY. 60 tablet 6  . Multiple Vitamin (MULTIVITAMIN WITH MINERALS) TABS tablet Take 1 tablet by mouth daily.    . nitroGLYCERIN (NITROSTAT) 0.4 MG SL tablet Place 0.4 mg under the tongue every 5 (five) minutes as needed.      . pantoprazole (PROTONIX) 40 MG tablet Take 1 tablet (40 mg total) by mouth daily. (Patient taking differently: Take 40 mg by mouth at bedtime. ) 90 tablet 3  . polyethylene glycol powder (GLYCOLAX/MIRALAX) powder Take 17 g by mouth daily as needed.   3   No current facility-administered medications for this visit.   Facility-Administered  Medications Ordered in Other Visits  Medication Dose Route Frequency Provider Last Rate Last Dose  . heparin lock flush 100 unit/mL  500 Units Intravenous Once Forest Gleason, MD   500 Units at 07/24/14 1630  . sodium chloride 0.9 % injection 10 mL  10 mL Intravenous PRN Forest Gleason, MD   10 mL at 07/24/14 1020  . sodium chloride 0.9 % injection 10 mL  10 mL Intracatheter PRN Forest Gleason, MD   10 mL at 07/24/14 1020  . sodium chloride 0.9 % injection 10 mL  10 mL Intravenous PRN Forest Gleason, MD  10 mL at 08/12/14 0834    OBJECTIVE:  Filed Vitals:   11/19/14 0906  BP: 130/55  Pulse: 74  Temp: 95.8 F (35.4 C)     Body mass index is 21.77 kg/(m^2).    ECOG FS:2 - Symptomatic, <50% confined to bed  PHYSICAL EXAM: Gen. status: Patient is in wheelchair.  Lymphatic system: No palpable supraclavicular cervical axillary inguinal adenopathy.  Lungs: Diminished air entry on both sides.  No rhonchi.  No rales.  Patient does have Pleurx catheter still draining well pleural rub  Cardiac: Normal heart sounds.  Soft systolic murmur. Lymphatic system: Supraclavicular, cervical, axillary, inguinal lymph nodes are not palpable Abdomen: Soft.  No palpable masses no tenderness no ascites liver and spleen not palpable. Lower extremity trace edema Skin: Multiple ecchymosis Neurological system: Higher functions within normal limit.  Cranial nerves are intact.  Motor and sensory systems within normal limit All other systems were examined HEENT: No abnormality HEENT: No evidence of any infection in the gums or teeth.  No soreness in the mouth Cardiac: No case no irregular heart sounds   LAB RESULTS:  Infusion on 11/19/2014  Component Date Value Ref Range Status  . WBC 11/19/2014 3.2* 3.6 - 11.0 K/uL Final   A-LINE DRAW  . RBC 11/19/2014 4.70  3.80 - 5.20 MIL/uL Final  . Hemoglobin 11/19/2014 13.6  12.0 - 16.0 g/dL Final  . HCT 11/19/2014 41.0  35.0 - 47.0 % Final  . MCV 11/19/2014 87.4  80.0 - 100.0  fL Final  . MCH 11/19/2014 29.0  26.0 - 34.0 pg Final  . MCHC 11/19/2014 33.2  32.0 - 36.0 g/dL Final  . RDW 11/19/2014 14.6* 11.5 - 14.5 % Final  . Platelets 11/19/2014 140* 150 - 440 K/uL Final  . Neutrophils Relative % 11/19/2014 44   Final  . Lymphocytes Relative 11/19/2014 37   Final  . Monocytes Relative 11/19/2014 13   Final  . Eosinophils Relative 11/19/2014 3   Final  . Basophils Relative 11/19/2014 3   Final  . Neutro Abs 11/19/2014 1.4  1.4 - 6.5 K/uL Final  . Lymphs Abs 11/19/2014 1.2  1.0 - 3.6 K/uL Final  . Monocytes Absolute 11/19/2014 0.4  0.2 - 0.9 K/uL Final  . Eosinophils Absolute 11/19/2014 0.1  0 - 0.7 K/uL Final  . Basophils Absolute 11/19/2014 0.1  0 - 0.1 K/uL Final  . Smear Review 11/19/2014 SMEAR SCANNED    Final  . Sodium 11/19/2014 131* 135 - 145 mmol/L Final  . Potassium 11/19/2014 4.0  3.5 - 5.1 mmol/L Final  . Chloride 11/19/2014 101  101 - 111 mmol/L Final  . CO2 11/19/2014 25  22 - 32 mmol/L Final  . Glucose, Bld 11/19/2014 200* 65 - 99 mg/dL Final  . BUN 11/19/2014 14  6 - 20 mg/dL Final  . Creatinine, Ser 11/19/2014 0.90  0.44 - 1.00 mg/dL Final  . Calcium 11/19/2014 8.2* 8.9 - 10.3 mg/dL Final  . Total Protein 11/19/2014 6.0* 6.5 - 8.1 g/dL Final  . Albumin 11/19/2014 3.4* 3.5 - 5.0 g/dL Final  . AST 11/19/2014 29  15 - 41 U/L Final  . ALT 11/19/2014 10* 14 - 54 U/L Final  . Alkaline Phosphatase 11/19/2014 75  38 - 126 U/L Final  . Total Bilirubin 11/19/2014 0.5  0.3 - 1.2 mg/dL Final  . GFR calc non Af Amer 11/19/2014 53* >60 mL/min Final  . GFR calc Af Amer 11/19/2014 >60  >60 mL/min Final   Comment: (  NOTE) The eGFR has been calculated using the CKD EPI equation. This calculation has not been validated in all clinical situations. eGFR's persistently <60 mL/min signify possible Chronic Kidney Disease.   . Anion gap 11/19/2014 5  5 - 15 Final  . Magnesium 11/19/2014 1.6* 1.7 - 2.4 mg/dL Final   IMPRESSION: New 8 mm hypermetabolic right  external iliac lymph node. No other metabolically active disease identified.   Electronically Signed  By: Earle Gell M.D.  On: 11/17/2014 13:02    ASSESSMENT: 81 year old lady with follicular lymphoma appears to be in remission on maintenance Rituxan therapy Hypokinemia is improved.  Potassium is 4.0 Hypomagnesemia Continue IV magnesium syrup supplement every 2 weeks. Hip magnesium is between 1.4 and 1.6 patient will receive 2 g of intravenous magnesium.  If it is less than 1.4 than 4 g intravenously. Will continue IV magnesium supplement as patient has poor tolerance to oral magnesium Continue maintenance rituximab therapy every 2 months  MEDICAL DECISION MAKING:  All lab data has been reviewed.  Will continue to supplement intravenous magnesium.  Overall potassium has been started. Continue maintenance Rituxan  At scan has been reviewed independently shows significant response except for small 8 mm lymph node.  Retroperitoneal lymph node has resolved completely Continue monitoring magnesium and if needed IV magnesium will be given Patient was advised to decrease the approved or drainage only once every 2 weeks and if it drops below 100 cc then once a month.  Reevaluation in next 2 months possibility of removing Pleurx catheter would be considered.  Patient expressed understanding and was in agreement with this plan. She also understands that She can call clinic at any time with any questions, concerns, or complaints.    No matching staging information was found for the patient.  Forest Gleason, MD   11/23/2014 9:14 AM    .:  .

## 2014-11-21 DIAGNOSIS — J910 Malignant pleural effusion: Secondary | ICD-10-CM | POA: Diagnosis not present

## 2014-11-23 ENCOUNTER — Encounter: Payer: Self-pay | Admitting: Oncology

## 2014-12-03 ENCOUNTER — Inpatient Hospital Stay: Payer: Medicare Other

## 2014-12-03 ENCOUNTER — Inpatient Hospital Stay: Payer: Medicare Other | Attending: Oncology

## 2014-12-03 DIAGNOSIS — C8293 Follicular lymphoma, unspecified, intra-abdominal lymph nodes: Secondary | ICD-10-CM | POA: Insufficient documentation

## 2014-12-04 ENCOUNTER — Telehealth: Payer: Self-pay | Admitting: *Deleted

## 2014-12-04 MED ORDER — METOPROLOL SUCCINATE ER 25 MG PO TB24: 25.0000 mg | ORAL_TABLET | Freq: Every day | ORAL | Status: DC

## 2014-12-04 NOTE — Telephone Encounter (Signed)
Spoke w/ Caroline Russo.  Advised her that I spoke w/ Dr. Rockey Situ.  He recommends that pt decrease her metoprolol succinate 25 mg back to once daily.  Discussed w/ her the timing, if BP is typically high on waking, give pill at night; if elevated more often in evening, give pill in am.  She verbalizes understanding and is appreciative of the call.  Asked her to continue to monitor and call back if readings/symptoms do not improve.

## 2014-12-04 NOTE — Telephone Encounter (Signed)
Pt c/o BP issue: STAT if pt c/o blurred vision, one-sided weakness or slurred speech  1. What are your last 5 BP readings? Taken in morning 12/04/14 85/48 12/03/14 100/50 12/02/14 180/50 can't remember excat 12/01/14 125/40 11/29/14 125/40  2. Are you having any other symptoms (ex. Dizziness, headache, blurred vision, passed out)?  No just lightheadedness, every morning, she states she feels llike she will pass out.   3. What is your BP issue? Usually takes BP in morning for problems start around then.  Has also been checking Blood sugar, states it is doing well.

## 2014-12-04 NOTE — Telephone Encounter (Signed)
Spoke w/ [name].  She reports that pt's BP has been low recently, pt feels dizzy and near syncope each am. Reports that pt has skipped beats, so her digital monitor does not accurately read her BP. She purchased a manual cuff and got the previous readings. She does not check HR manually, but does have some readings of 51, 54, 48, reports highest HR last week of 61. Pt recently decreased losartan to 50 mg daily, has even been giving every other day. Pt takes metoprolol succinate 25mg  BID, she reports that she has taken this way for years.  Pt has not yet taken am meds, advised her to hold until I speak w/ Dr. Rockey Situ.  She is appreciative and requests that I call her cell phone, as she has to go out and pt will have a Actuary.

## 2014-12-08 ENCOUNTER — Telehealth: Payer: Self-pay | Admitting: *Deleted

## 2014-12-08 NOTE — Telephone Encounter (Signed)
Jonette Eva at 12/08/2014 9:19 AM             Pt daughter calling giving [place] an update  Today:  117/42 Hr 42 Pt did have a dizzy spell. Did not give metoprolol yesterday And only gave half Losartan yesterday Would like some advise on this. For this is been an ongoing issues  Please advise

## 2014-12-08 NOTE — Telephone Encounter (Signed)
Please see previous phone note.  

## 2014-12-08 NOTE — Telephone Encounter (Signed)
Would hold metoprolol for now, but continue losartan in the morning after breakfast

## 2014-12-08 NOTE — Telephone Encounter (Signed)
Pt daughter calling giving [place] an update  Today:  117/42 Hr 42 Pt did have a dizzy spell. Did not give metoprolol yesterday And only gave half Losartan yesterday Would like some advise on this. For this is been an ongoing issues  Please advise

## 2014-12-09 NOTE — Telephone Encounter (Signed)
Spoke w/ [name].  Advised her of Dr. Donivan Scull recommendation.  She is agreeable and will continue to monitor pt & call if sx do not improve.

## 2014-12-10 ENCOUNTER — Telehealth: Payer: Self-pay | Admitting: *Deleted

## 2014-12-10 ENCOUNTER — Ambulatory Visit: Payer: Medicare Other | Admitting: Cardiovascular Disease

## 2014-12-10 NOTE — Telephone Encounter (Signed)
Called to report that she missed her lab appt last week and wants her to come in for a mg+ check today

## 2014-12-10 NOTE — Telephone Encounter (Signed)
[name] will call to reschedule pt

## 2014-12-12 ENCOUNTER — Inpatient Hospital Stay: Payer: Medicare Other

## 2014-12-12 ENCOUNTER — Inpatient Hospital Stay: Payer: Medicare Other | Attending: Oncology

## 2014-12-12 DIAGNOSIS — C8293 Follicular lymphoma, unspecified, intra-abdominal lymph nodes: Secondary | ICD-10-CM

## 2014-12-12 DIAGNOSIS — C801 Malignant (primary) neoplasm, unspecified: Secondary | ICD-10-CM

## 2014-12-12 LAB — MAGNESIUM: Magnesium: 1.8 mg/dL (ref 1.7–2.4)

## 2014-12-12 MED ORDER — SODIUM CHLORIDE 0.9 % IJ SOLN
10.0000 mL | Freq: Once | INTRAMUSCULAR | Status: AC
  Administered 2014-12-12: 10 mL via INTRAVENOUS
  Filled 2014-12-12: qty 10

## 2014-12-12 MED ORDER — HEPARIN SOD (PORK) LOCK FLUSH 100 UNIT/ML IV SOLN
500.0000 [IU] | Freq: Once | INTRAVENOUS | Status: AC
  Administered 2014-12-12: 500 [IU] via INTRAVENOUS

## 2014-12-15 ENCOUNTER — Telehealth: Payer: Self-pay | Admitting: *Deleted

## 2014-12-15 NOTE — Telephone Encounter (Signed)
Pt daughter calling wanting to give [place] an update She states pt hr is a bit low  Last 3 days  12/12/14 180/60  12/13/14 120/40 hr 34 saturday 12/14/14 130/50 hr 36 yesterday 12/15/14 150/50 hr 48 today  Pt is still having some dizziness and pt is not feeling well. But later in the afternoons pt feels a lot better. Not sure what could be causing the mornings to be so hard on pt.  Please advise

## 2014-12-16 ENCOUNTER — Telehealth: Payer: Self-pay | Admitting: Family Medicine

## 2014-12-16 ENCOUNTER — Encounter: Payer: Self-pay | Admitting: Nurse Practitioner

## 2014-12-16 ENCOUNTER — Ambulatory Visit (INDEPENDENT_AMBULATORY_CARE_PROVIDER_SITE_OTHER): Payer: Medicare Other | Admitting: Nurse Practitioner

## 2014-12-16 VITALS — BP 142/60 | HR 77 | Ht 62.5 in | Wt 120.8 lb

## 2014-12-16 DIAGNOSIS — I951 Orthostatic hypotension: Secondary | ICD-10-CM

## 2014-12-16 DIAGNOSIS — I10 Essential (primary) hypertension: Secondary | ICD-10-CM | POA: Insufficient documentation

## 2014-12-16 DIAGNOSIS — R001 Bradycardia, unspecified: Secondary | ICD-10-CM

## 2014-12-16 DIAGNOSIS — E119 Type 2 diabetes mellitus without complications: Secondary | ICD-10-CM | POA: Insufficient documentation

## 2014-12-16 DIAGNOSIS — R42 Dizziness and giddiness: Secondary | ICD-10-CM | POA: Diagnosis not present

## 2014-12-16 MED ORDER — LOSARTAN POTASSIUM 50 MG PO TABS: 50.0000 mg | ORAL_TABLET | Freq: Every day | ORAL | Status: DC

## 2014-12-16 NOTE — Telephone Encounter (Signed)
Cancer center calling stating pt daughter calling them asking about below.  She is stating to them she is concerned now about her HR being this low. Please advise.

## 2014-12-16 NOTE — Telephone Encounter (Signed)
Spoke w/ Caroline Russo.  Advised her that Dr. Rockey Situ does not want her to go to ED for a med change. Asked her to come here and see Ignacia Bayley, NP @ 4:00. She is agreeable and will have Caroline Russo bring her over.  Left message on Cheryl's vm w/ this info. Asked her to call back w/ any questions or concerns.

## 2014-12-16 NOTE — Progress Notes (Signed)
Patient Name: Caroline Russo Date of Encounter: 12/16/2014  Primary Care Provider:  Dicky Doe, MD Primary Cardiologist:  Johnny Bridge, MD   Chief Complaint  81 y/o female with a history of hypertension, nonobstructive CAD, and orthostasis who presents for follow-up today related to ongoing postprandial lightheadedness and questionable bradycardia.  Past Medical History   Past Medical History  Diagnosis Date  . HTN (hypertension)   . DM2 (diabetes mellitus, type 2) (HCC)     insulin requiring  . Hypercholesterolemia   . Murmur     hx  . Tinnitus     chronic  . Macular degeneration   . Chronic chest pain     a. nonobs cath 2002.  Marland Kitchen Palpitations     a. 01/2011 Holter monitor showed frequent PACs, sinus bradycardia  . Anxiety   . Depression   . Irregular cardiac rhythm   . Heart murmur     a. 09/2014 Echo: EF 55-60%, nl wm, mod MS, mild MR, mod dil LA, mild-mod TR.  . Cataract   . Non Hodgkin's lymphoma ([place])   . Hypomagnesemia 06/13/2014  . Non-obstructive CAD   . Orthostatic hypotension    Past Surgical History  Procedure Laterality Date  . Cardiac catheterization  2002  . Tumor removal  2012    benign neck tumor removed  . Tooth extraction    . Knee surgery Left   . Ankle surgery Left   . Partial hysterectomy    . Insertion / placement pleural catheter      Allergies  Allergies  Allergen Reactions  . Moxifloxacin Shortness Of Breath    hallucinations  . Codeine Other (See Comments)    unknown  . Penicillins     Itching  Rash   . Prednisone     Hallucinations  . Unasyn [Ampicillin-Sulbactam Sodium] Other (See Comments)    unknown    HPI  81 year old female with the above accomplished, list. She was last seen by Dr. Rockey Situ a few weeks ago at which time she complained of lightheadedness. She was advised to move her losartan dose to after her morning meal and it was felt that she may require discontinuation of beta blocker therapy. Since that  visit, she is continued to have lightheadedness that occurs almost exclusively while eating breakfast associated with a sensation that she may lose consciousness. She does not have chest pain or dyspnea. She has never actually had syncope. She has to stop eating midway through her meal and then lie down for a few hours before feeling better. Her daughter has checked her blood pressure during these episodes and it has generally been normal in the 120s to 130 range. Her heart rates returned on the blood pressure machine in the 30s to 40s. They were advised to discontinue beta blocker therapy and an appointment was made for today. She did have an episode while eating breakfast this morning. She denies PND, orthopnea, dizziness, syncope, edema, or early satiety.  Home Medications  Prior to Admission medications   Medication Sig Start Date End Date Taking? Authorizing Provider  ALPRAZolam (XANAX) 0.25 MG tablet Take 1 tablet (0.25 mg total) by mouth every 6 (six) hours as needed. 11/05/14  Yes Forest Gleason, MD  benzonatate (TESSALON) 100 MG capsule Take 100 mg by mouth 3 (three) times daily as needed.  10/10/13  Yes Historical Provider, MD  cyclobenzaprine (FLEXERIL) 10 MG tablet Take 10 mg by mouth 2 (two) times daily as needed for muscle spasms.  Yes Historical Provider, MD  Diphenhyd-Hydrocort-Nystatin (FIRST-DUKES MOUTHWASH MT)  07/23/14  Yes Historical Provider, MD  docusate sodium (COLACE) 100 MG capsule TAKE ONE CAPSULE BY MOUTH TWICE A DAY 11/05/14  Yes Forest Gleason, MD  fluticasone (FLONASE) 50 MCG/ACT nasal spray Place 1 spray into both nostrils daily.   Yes Historical Provider, MD  lactulose (CHRONULAC) 10 GM/15ML solution 10 g 2 (two) times daily as needed.  09/17/13  Yes Historical Provider, MD  lidocaine-prilocaine (EMLA) cream Apply 1 application topically as needed. 07/18/14  Yes Forest Gleason, MD  magnesium chloride (SLOW-MAG) 64 MG TBEC SR tablet Take 1 tablet (64 mg total) by mouth 2 (two)  times daily. 06/12/14  Yes Forest Gleason, MD  meclizine (ANTIVERT) 12.5 MG tablet Take 12.5 mg by mouth 3 (three) times daily as needed.   Yes Historical Provider, MD  metoCLOPramide (REGLAN) 10 MG tablet Take 10 mg by mouth 4 (four) times daily.   Yes Historical Provider, MD  Multiple Vitamin (MULTIVITAMIN WITH MINERALS) TABS tablet Take 1 tablet by mouth daily.   Yes Historical Provider, MD  nitroGLYCERIN (NITROSTAT) 0.4 MG SL tablet Place 0.4 mg under the tongue every 5 (five) minutes as needed.     Yes Historical Provider, MD  pantoprazole (PROTONIX) 40 MG tablet Take 1 tablet (40 mg total) by mouth daily. Patient taking differently: Take 40 mg by mouth at bedtime.  12/12/13  Yes Minna Merritts, MD  polyethylene glycol powder (GLYCOLAX/MIRALAX) powder Take 17 g by mouth daily as needed.  09/17/13  Yes Historical Provider, MD  losartan (COZAAR) 50 MG tablet Take 1 tablet (50 mg total) by mouth daily. Take after breakfast 12/16/14   Rogelia Mire, NP    Review of Systems  Postprandial presyncope as outlined above.  All other systems reviewed and are otherwise negative except as noted above.  Physical Exam  VS:  BP 142/60 mmHg  Pulse 77  Ht 5' 2.5" (1.588 m)  Wt 120 lb 12 oz (54.772 kg)  BMI 21.72 kg/m2 , BMI Body mass index is 21.72 kg/(m^2).   Orthostatic vital signs: Blood pressure lying 207/65, heart rate 73 Blood pressure sitting 140/60, heart rate 53 Blood pressure standing 130/62 heart rate 72 Blood pressure standing 3 minutes 160/70, heart rate 65 GEN: Well nourished, well developed, in no acute distress. HEENT: normal. Neck: Supple, no JVD, carotid bruits, or masses. Cardiac: RRR, no rubs, or gallops. Soft systolic murmur at the right upper sternal border. No clubbing, cyanosis, edema.  Radials/DP/PT 2+ and equal bilaterally.  Respiratory:  Respirations regular and unlabored, clear to auscultation bilaterally. GI: Soft, nontender, nondistended, BS + x 4. MS: no  deformity or atrophy. Skin: warm and dry, no rash. Neuro:  Strength and sensation are intact. Psych: Normal affect.  Accessory Clinical Findings  ECG - ECG reviewed with Dr. Rockey Situ. She appears to have a wandering atrial pacemaker with evidence of PR lengthening and premature junctional beats with retrograde P waves. There are no acute ST or T changes.  Assessment & Plan  1.  Orthostatic hypotension/lightheadedness: The patient has been experiencing postprandial lightheadedness and presyncope while sitting. This is been occurring daily and fairly predictably over the past several weeks. She is orthostatic on exam today dropping her blood pressure from 207 while lying to 130 while standing. She was symptomatic with that change in position. I reviewed her case with Dr. Rockey Situ today and have recommended that she reduce her losartan to 50 mg daily to be taken after her  morning meal. I've also encouraged her to wear her compression stockings, which apparently helped with the symptoms in the past. She says that she has trouble putting them on and thus when she's not been wearing them. She and her daughter plan to be sure that she wears them in the future.  If these measures do not abate her Ss, we may need to consider adding low-dose midodrine, though it will be a delicate balance between HTN and hypoTN. As there has been a report of bradycardia during these episodes, we will place a 48-hour Holter monitor though I suspect that the blood pressure cuff is not picking up the PACs and sees that she is likely experiencing.  2. Essential hypertension: See above.  3. Disposition: Follow-up in 2-3 weeks or sooner if necessary. Family will call for ongoing issues.     Murray Hodgkins, NP 12/16/2014, 5:21 PM

## 2014-12-16 NOTE — Telephone Encounter (Signed)
She needs to be taken to [facility] for evaluation of dangerously low heart rate.-jh

## 2014-12-16 NOTE — Telephone Encounter (Signed)
Left message for Cheryl to call back

## 2014-12-16 NOTE — Telephone Encounter (Signed)
Pt coming here to [facility] today @ 4:00 to see Ignacia Bayley, NP.

## 2014-12-16 NOTE — Telephone Encounter (Signed)
Pt daughter called  [name]) states that pt  Cancer [name] Dr. Was not available need advise what to do BP for 3-4 weeks was normal  Pulse was running  34-36 (today 48)  Pt was on  Losartan  100 mg . [name] call back  # is  [phone]

## 2014-12-16 NOTE — Telephone Encounter (Signed)
Pt calling back asking about below. Please advise. Pt and daughter are a bit anxious

## 2014-12-16 NOTE — Patient Instructions (Addendum)
Medication Instructions:  Your physician has recommended you make the following change in your medication:  TAKE losartan 50mg  once per day AFTER BREAKFAST   Labwork: none  Testing/Procedures: Your physician has recommended that you wear a 48 hour  holter monitor. Holter monitors are medical devices that record the heart's electrical activity. Doctors most often use these monitors to diagnose arrhythmias. Arrhythmias are problems with the speed or rhythm of the heartbeat. The monitor is a small, portable device. You can wear one while you do your normal daily activities. This is usually used to diagnose what is causing palpitations/syncope (passing out).    Follow-Up: Your physician recommends that you schedule a follow-up appointment in: 2 weeks with Christell Faith, PA-C or Dr. Rockey Situ   Any Other Special Instructions Will Be Listed Below (If Applicable). Wear support hose and change positions slowly     If you need a refill on your cardiac medications before your next appointment, please call your pharmacy.  Cardiac Event Monitoring A cardiac event monitor is a small recording device used to help detect abnormal heart rhythms (arrhythmias). The monitor is used to record heart rhythm when noticeable symptoms such as the following occur:  Fast heartbeats (palpitations), such as heart racing or fluttering.  Dizziness.  Fainting or light-headedness.  Unexplained weakness. The monitor is wired to two electrodes placed on your chest. Electrodes are flat, sticky disks that attach to your skin. The monitor can be worn for up to 30 days. You will wear the monitor at all times, except when bathing.  HOW TO USE YOUR CARDIAC EVENT MONITOR A technician will prepare your chest for the electrode placement. The technician will show you how to place the electrodes, how to work the monitor, and how to replace the batteries. Take time to practice using the monitor before you leave the office. Make sure  you understand how to send the information from the monitor to your health care provider. This requires a telephone with a landline, not a cell phone. You need to:  Wear your monitor at all times, except when you are in water:  Do not get the monitor wet.  Take the monitor off when bathing. Do not swim or use a hot tub with it on.  Keep your skin clean. Do not put body lotion or moisturizer on your chest.  Change the electrodes daily or any time they stop sticking to your skin. You might need to use tape to keep them on.  It is possible that your skin under the electrodes could become irritated. To keep this from happening, try to put the electrodes in slightly different places on your chest. However, they must remain in the area under your left breast and in the upper right section of your chest.  Make sure the monitor is safely clipped to your clothing or in a location close to your body that your health care provider recommends.  Press the button to record when you feel symptoms of heart trouble, such as dizziness, weakness, light-headedness, palpitations, thumping, shortness of breath, unexplained weakness, or a fluttering or racing heart. The monitor is always on and records what happened slightly before you pressed the button, so do not worry about being too late to get good information.  Keep a diary of your activities, such as walking, doing chores, and taking medicine. It is especially important to note what you were doing when you pushed the button to record your symptoms. This will help your health care provider determine  what might be contributing to your symptoms. The information stored in your monitor will be reviewed by your health care provider alongside your diary entries.  Send the recorded information as recommended by your health care provider. It is important to understand that it will take some time for your health care provider to process the results.  Change the  batteries as recommended by your health care provider. SEEK IMMEDIATE MEDICAL CARE IF:   You have chest pain.  You have extreme difficulty breathing or shortness of breath.  You develop a very fast heartbeat that persists.  You develop dizziness that does not go away.  You faint or constantly feel you are about to faint.   This information is not intended to replace advice given to you by your health care provider. Make sure you discuss any questions you have with your health care provider.   Document Released: 10/20/2007 Document Revised: 01/31/2014 Document Reviewed: 07/09/2012 Elsevier Interactive Patient Education [employer].

## 2014-12-17 ENCOUNTER — Inpatient Hospital Stay: Payer: Medicare Other

## 2014-12-17 DIAGNOSIS — C8293 Follicular lymphoma, unspecified, intra-abdominal lymph nodes: Secondary | ICD-10-CM | POA: Diagnosis not present

## 2014-12-17 LAB — MAGNESIUM: MAGNESIUM: 1.7 mg/dL (ref 1.7–2.4)

## 2014-12-17 MED ORDER — HEPARIN SOD (PORK) LOCK FLUSH 100 UNIT/ML IV SOLN
500.0000 [IU] | Freq: Once | INTRAVENOUS | Status: AC
  Administered 2014-12-17: 500 [IU] via INTRAVENOUS
  Filled 2014-12-17: qty 5

## 2014-12-17 MED ORDER — SODIUM CHLORIDE 0.9 % IJ SOLN
10.0000 mL | Freq: Once | INTRAMUSCULAR | Status: AC
  Administered 2014-12-17: 10 mL via INTRAVENOUS
  Filled 2014-12-17: qty 10

## 2014-12-21 ENCOUNTER — Other Ambulatory Visit: Payer: Self-pay | Admitting: Cardiovascular Disease

## 2014-12-22 ENCOUNTER — Ambulatory Visit (INDEPENDENT_AMBULATORY_CARE_PROVIDER_SITE_OTHER): Payer: Medicare Other | Admitting: Family Medicine

## 2014-12-22 ENCOUNTER — Encounter: Payer: Self-pay | Admitting: Family Medicine

## 2014-12-22 VITALS — BP 153/74 | HR 81 | Ht 63.0 in | Wt 122.2 lb

## 2014-12-22 DIAGNOSIS — I491 Atrial premature depolarization: Secondary | ICD-10-CM | POA: Diagnosis not present

## 2014-12-22 DIAGNOSIS — I10 Essential (primary) hypertension: Secondary | ICD-10-CM

## 2014-12-22 DIAGNOSIS — E119 Type 2 diabetes mellitus without complications: Secondary | ICD-10-CM | POA: Diagnosis not present

## 2014-12-22 DIAGNOSIS — C8590 Non-Hodgkin lymphoma, unspecified, unspecified site: Secondary | ICD-10-CM

## 2014-12-22 DIAGNOSIS — Z23 Encounter for immunization: Secondary | ICD-10-CM

## 2014-12-22 LAB — POCT GLYCOSYLATED HEMOGLOBIN (HGB A1C): HEMOGLOBIN A1C: 6.2

## 2014-12-22 LAB — MICROALBUMIN, URINE: Microalb, Ur: NEGATIVE

## 2014-12-22 LAB — POCT UA - MICROALBUMIN: MICROALBUMIN (UR) POC: 20 mg/L

## 2014-12-22 NOTE — Progress Notes (Signed)
Name: Caroline Russo   MRN: [id number]    DOB: 08-30-1919   Date:12/22/2014       Progress Note  Subjective  Chief Complaint  Chief Complaint  Patient presents with  . Hypertension    Left leg burn X 4 Days    HPI Here for f/u of HBP.  Has diet controlled DM, Lymphoma, card.arhythmia, anxiety.  She seees Dr. Jeb Levering, Dr. Rockey Situ , and Dr. Gabriel Carina for her other problems.  Doing well overall.  Dr. Jeb Levering pleased with her progress with cancer.  No problem-specific assessment & plan notes found for this encounter.   Past Medical History  Diagnosis Date  . HTN (hypertension)   . DM2 (diabetes mellitus, type 2) (HCC)     insulin requiring  . Hypercholesterolemia   . Murmur     hx  . Tinnitus     chronic  . Macular degeneration   . Chronic chest pain     a. nonobs cath 2002.  Marland Kitchen Palpitations     a. 01/2011 Holter monitor showed frequent PACs, sinus bradycardia  . Anxiety   . Depression   . Irregular cardiac rhythm   . Heart murmur     a. 09/2014 Echo: EF 55-60%, nl wm, mod MS, mild MR, mod dil LA, mild-mod TR.  . Cataract   . Non Hodgkin's lymphoma ([place])   . Hypomagnesemia 06/13/2014  . Non-obstructive CAD   . Orthostatic hypotension     Past Surgical History  Procedure Laterality Date  . Cardiac catheterization  2002  . Tumor removal  2012    benign neck tumor removed  . Tooth extraction    . Knee surgery Left   . Ankle surgery Left   . Partial hysterectomy    . Insertion / placement pleural catheter      Family History  Problem Relation Age of Onset  . Heart disease Mother   . Lung cancer Brother     smoked  . Bladder Cancer Brother   . Stomach cancer Brother     Social History   Social History  . Marital Status: Widowed    Spouse Name: N/A  . Number of Children: N/A  . Years of Education: N/A   Occupational History  . Retired    Social History Main Topics  . Smoking status: Former Smoker -- 0.25 packs/day for 7 years    Types: Cigarettes    Quit  date: 01/25/1988  . Smokeless tobacco: Never Used  . Alcohol Use: No  . Drug Use: No  . Sexual Activity: Not on file   Other Topics Concern  . Not on file   Social History Narrative   Retired. Widowed. 3 children, 7 grandchildren, 2 great- grandchildren.      Current outpatient prescriptions:  .  ALPRAZolam (XANAX) 0.25 MG tablet, Take 1 tablet (0.25 mg total) by mouth every 6 (six) hours as needed., Disp: 30 tablet, Rfl: 1 .  benzonatate (TESSALON) 100 MG capsule, Take 100 mg by mouth 3 (three) times daily as needed. , Disp: , Rfl: 0 .  cyclobenzaprine (FLEXERIL) 10 MG tablet, Take 10 mg by mouth 2 (two) times daily as needed for muscle spasms., Disp: , Rfl:  .  Diphenhyd-Hydrocort-Nystatin (FIRST-DUKES MOUTHWASH MT), , Disp: , Rfl: 2 .  docusate sodium (COLACE) 100 MG capsule, TAKE ONE CAPSULE BY MOUTH TWICE A DAY, Disp: 60 capsule, Rfl: 3 .  fluticasone (FLONASE) 50 MCG/ACT nasal spray, Place 1 spray into both nostrils daily., Disp: ,  Rfl:  .  lidocaine-prilocaine (EMLA) cream, Apply 1 application topically as needed., Disp: 30 g, Rfl: 3 .  losartan (COZAAR) 50 MG tablet, Take 1 tablet (50 mg total) by mouth daily. Take after breakfast, Disp: 30 tablet, Rfl: 3 .  magnesium chloride (SLOW-MAG) 64 MG TBEC SR tablet, Take 1 tablet (64 mg total) by mouth 2 (two) times daily., Disp: 60 tablet, Rfl: 3 .  meclizine (ANTIVERT) 12.5 MG tablet, Take 12.5 mg by mouth 3 (three) times daily as needed., Disp: , Rfl:  .  Multiple Vitamin (MULTIVITAMIN WITH MINERALS) TABS tablet, Take 1 tablet by mouth daily., Disp: , Rfl:  .  nitroGLYCERIN (NITROSTAT) 0.4 MG SL tablet, Place 0.4 mg under the tongue every 5 (five) minutes as needed.  , Disp: , Rfl:  .  pantoprazole (PROTONIX) 40 MG tablet, Take 1 tablet (40 mg total) by mouth daily. (Patient taking differently: Take 40 mg by mouth at bedtime. ), Disp: 90 tablet, Rfl: 3 .  polyethylene glycol powder (GLYCOLAX/MIRALAX) powder, Take 17 g by mouth daily  as needed. , Disp: , Rfl: 3 No current facility-administered medications for this visit.  Facility-Administered Medications Ordered in Other Visits:  .  heparin lock flush 100 unit/mL, 500 Units, Intravenous, Once, Forest Gleason, MD, 500 Units at 07/24/14 1630 .  sodium chloride 0.9 % injection 10 mL, 10 mL, Intravenous, PRN, Forest Gleason, MD, 10 mL at 07/24/14 1020 .  sodium chloride 0.9 % injection 10 mL, 10 mL, Intracatheter, PRN, Forest Gleason, MD, 10 mL at 07/24/14 1020 .  sodium chloride 0.9 % injection 10 mL, 10 mL, Intravenous, PRN, Forest Gleason, MD, 10 mL at 08/12/14 0834  Allergies  Allergen Reactions  . Moxifloxacin Shortness Of Breath    hallucinations  . Codeine Other (See Comments)    unknown  . Penicillins     Itching  Rash   . Prednisone     Hallucinations  . Unasyn [Ampicillin-Sulbactam Sodium] Other (See Comments)    unknown     Review of Systems  Constitutional: Positive for malaise/fatigue. Negative for fever, chills and weight loss.  HENT: Negative for hearing loss.   Eyes: Negative for blurred vision and double vision.  Respiratory: Negative for cough, shortness of breath and wheezing.   Cardiovascular: Positive for palpitations. Negative for chest pain and leg swelling.  Gastrointestinal: Negative for heartburn, abdominal pain and blood in stool.  Genitourinary: Positive for frequency (nocturia x 1). Negative for dysuria and urgency.  Musculoskeletal: Negative for myalgias and joint pain.  Skin: Negative for rash.  Neurological: Positive for dizziness and weakness. Negative for tremors and headaches.      Objective  Filed Vitals:   12/22/14 1044  BP: 153/74  Pulse: 81  Height: 5' 3"  (1.6 m)  Weight: 122 lb 3.2 oz (55.43 kg)    Physical Exam  Constitutional: She is oriented to person, place, and time and well-developed, well-nourished, and in no distress. No distress.  HENT:  Head: Normocephalic and atraumatic.  Eyes: Conjunctivae and EOM are  normal. Pupils are equal, round, and reactive to light. No scleral icterus.  Neck: Normal range of motion. Neck supple. No thyromegaly present.  Cardiovascular: Normal rate.  An irregularly irregular rhythm present. Exam reveals no gallop and no friction rub.   Murmur heard.  Systolic murmur is present with a grade of 2/6  Throughout.  Pulmonary/Chest: Effort normal and breath sounds normal. No respiratory distress. She has no wheezes. She has no rales. She exhibits no tenderness.  Abdominal: Soft. Bowel sounds are normal. She exhibits no distension and no mass. There is no tenderness.  Musculoskeletal: She exhibits no edema.  Lymphadenopathy:    She has no cervical adenopathy.  Neurological: She is alert and oriented to person, place, and time.  Skin:  Flap laceration of L lower ant. Leg.  Healing.       Recent Results (from the past 2160 hour(s))  CBC with Differential     Status: Abnormal   Collection Time: 09/24/14  9:06 AM  Result Value Ref Range   WBC 2.9 (L) 3.6 - 11.0 K/uL    Comment: A-LINE DRAW   RBC 4.58 3.80 - 5.20 MIL/uL   Hemoglobin 13.0 12.0 - 16.0 g/dL   HCT 39.2 35.0 - 47.0 %   MCV 85.6 80.0 - 100.0 fL   MCH 28.4 26.0 - 34.0 pg   MCHC 33.2 32.0 - 36.0 g/dL   RDW 15.0 (H) 11.5 - 14.5 %   Platelets 125 (L) 150 - 440 K/uL   Neutrophils Relative % 50% %   Neutro Abs 1.4 1.4 - 6.5 K/uL   Lymphocytes Relative 35% %   Lymphs Abs 1.0 1.0 - 3.6 K/uL   Monocytes Relative 11% %   Monocytes Absolute 0.3 0.2 - 0.9 K/uL   Eosinophils Relative 2% %   Eosinophils Absolute 0.1 0 - 0.7 K/uL   Basophils Relative 2% %   Basophils Absolute 0.1 0 - 0.1 K/uL   Smear Review SMEAR SCANNED   Comprehensive metabolic panel     Status: Abnormal   Collection Time: 09/24/14  9:06 AM  Result Value Ref Range   Sodium 136 135 - 145 mmol/L   Potassium 3.6 3.5 - 5.1 mmol/L   Chloride 102 101 - 111 mmol/L   CO2 27 22 - 32 mmol/L   Glucose, Bld 196 (H) 65 - 99 mg/dL   BUN 14 6 - 20  mg/dL   Creatinine, Ser 0.73 0.44 - 1.00 mg/dL   Calcium 8.4 (L) 8.9 - 10.3 mg/dL   Total Protein 5.7 (L) 6.5 - 8.1 g/dL   Albumin 3.2 (L) 3.5 - 5.0 g/dL   AST 26 15 - 41 U/L   ALT 10 (L) 14 - 54 U/L   Alkaline Phosphatase 77 38 - 126 U/L   Total Bilirubin 0.6 0.3 - 1.2 mg/dL   GFR calc non Af Amer >60 >60 mL/min   GFR calc Af Amer >60 >60 mL/min    Comment: (NOTE) The eGFR has been calculated using the CKD EPI equation. This calculation has not been validated in all clinical situations. eGFR's persistently <60 mL/min signify possible Chronic Kidney Disease.    Anion gap 7 5 - 15  Magnesium     Status: Abnormal   Collection Time: 09/24/14  9:06 AM  Result Value Ref Range   Magnesium 1.5 (L) 1.7 - 2.4 mg/dL  Magnesium     Status: Abnormal   Collection Time: 10/08/14  1:26 PM  Result Value Ref Range   Magnesium 1.6 (L) 1.7 - 2.4 mg/dL  Magnesium     Status: Abnormal   Collection Time: 10/22/14  2:39 PM  Result Value Ref Range   Magnesium 1.5 (L) 1.7 - 2.4 mg/dL  CBC with Differential     Status: Abnormal   Collection Time: 10/22/14  2:39 PM  Result Value Ref Range   WBC 2.5 (L) 3.6 - 11.0 K/uL   RBC 4.60 3.80 - 5.20 MIL/uL   Hemoglobin 13.2 12.0 -  16.0 g/dL   HCT 39.8 35.0 - 47.0 %   MCV 86.6 80.0 - 100.0 fL   MCH 28.6 26.0 - 34.0 pg   MCHC 33.1 32.0 - 36.0 g/dL   RDW 15.5 (H) 11.5 - 14.5 %   Platelets 133 (L) 150 - 440 K/uL   Neutrophils Relative % 30 %   Neutro Abs 0.8 (L) 1.4 - 6.5 K/uL   Lymphocytes Relative 51 %   Lymphs Abs 1.3 1.0 - 3.6 K/uL   Monocytes Relative 16 %   Monocytes Absolute 0.4 0.2 - 0.9 K/uL   Eosinophils Relative 2 %   Eosinophils Absolute 0.1 0 - 0.7 K/uL   Basophils Relative 1 %   Basophils Absolute 0.0 0 - 0.1 K/uL   Smear Review SMEAR SCANNED   Comprehensive metabolic panel     Status: Abnormal   Collection Time: 10/22/14  2:39 PM  Result Value Ref Range   Sodium 133 (L) 135 - 145 mmol/L   Potassium 3.7 3.5 - 5.1 mmol/L   Chloride 100  (L) 101 - 111 mmol/L   CO2 26 22 - 32 mmol/L   Glucose, Bld 182 (H) 65 - 99 mg/dL   BUN 12 6 - 20 mg/dL   Creatinine, Ser 0.84 0.44 - 1.00 mg/dL   Calcium 8.2 (L) 8.9 - 10.3 mg/dL   Total Protein 5.8 (L) 6.5 - 8.1 g/dL   Albumin 3.3 (L) 3.5 - 5.0 g/dL   AST 24 15 - 41 U/L   ALT 10 (L) 14 - 54 U/L   Alkaline Phosphatase 79 38 - 126 U/L   Total Bilirubin 0.6 0.3 - 1.2 mg/dL   GFR calc non Af Amer 58 (L) >60 mL/min   GFR calc Af Amer >60 >60 mL/min    Comment: (NOTE) The eGFR has been calculated using the CKD EPI equation. This calculation has not been validated in all clinical situations. eGFR's persistently <60 mL/min signify possible Chronic Kidney Disease.    Anion gap 7 5 - 15  CBC with Differential     Status: Abnormal   Collection Time: 11/19/14  8:38 AM  Result Value Ref Range   WBC 3.2 (L) 3.6 - 11.0 K/uL    Comment: A-LINE DRAW   RBC 4.70 3.80 - 5.20 MIL/uL   Hemoglobin 13.6 12.0 - 16.0 g/dL   HCT 41.0 35.0 - 47.0 %   MCV 87.4 80.0 - 100.0 fL   MCH 29.0 26.0 - 34.0 pg   MCHC 33.2 32.0 - 36.0 g/dL   RDW 14.6 (H) 11.5 - 14.5 %   Platelets 140 (L) 150 - 440 K/uL   Neutrophils Relative % 44 %   Lymphocytes Relative 37 %   Monocytes Relative 13 %   Eosinophils Relative 3 %   Basophils Relative 3 %   Neutro Abs 1.4 1.4 - 6.5 K/uL   Lymphs Abs 1.2 1.0 - 3.6 K/uL   Monocytes Absolute 0.4 0.2 - 0.9 K/uL   Eosinophils Absolute 0.1 0 - 0.7 K/uL   Basophils Absolute 0.1 0 - 0.1 K/uL   Smear Review SMEAR SCANNED    Comprehensive metabolic panel     Status: Abnormal   Collection Time: 11/19/14  8:38 AM  Result Value Ref Range   Sodium 131 (L) 135 - 145 mmol/L   Potassium 4.0 3.5 - 5.1 mmol/L   Chloride 101 101 - 111 mmol/L   CO2 25 22 - 32 mmol/L   Glucose, Bld 200 (H) 65 - 99  mg/dL   BUN 14 6 - 20 mg/dL   Creatinine, Ser 0.90 0.44 - 1.00 mg/dL   Calcium 8.2 (L) 8.9 - 10.3 mg/dL   Total Protein 6.0 (L) 6.5 - 8.1 g/dL   Albumin 3.4 (L) 3.5 - 5.0 g/dL   AST 29 15 - 41  U/L   ALT 10 (L) 14 - 54 U/L   Alkaline Phosphatase 75 38 - 126 U/L   Total Bilirubin 0.5 0.3 - 1.2 mg/dL   GFR calc non Af Amer 53 (L) >60 mL/min   GFR calc Af Amer >60 >60 mL/min    Comment: (NOTE) The eGFR has been calculated using the CKD EPI equation. This calculation has not been validated in all clinical situations. eGFR's persistently <60 mL/min signify possible Chronic Kidney Disease.    Anion gap 5 5 - 15  Magnesium     Status: Abnormal   Collection Time: 11/19/14  8:38 AM  Result Value Ref Range   Magnesium 1.6 (L) 1.7 - 2.4 mg/dL  Magnesium     Status: None   Collection Time: 12/12/14  1:11 PM  Result Value Ref Range   Magnesium 1.8 1.7 - 2.4 mg/dL  Magnesium     Status: None   Collection Time: 12/17/14  1:40 PM  Result Value Ref Range   Magnesium 1.7 1.7 - 2.4 mg/dL  POCT UA - Microalbumin     Status: Abnormal   Collection Time: 12/22/14 10:30 AM  Result Value Ref Range   Microalbumin Ur, POC 20 mg/L    Comment: 20   Creatinine, POC  mg/dL   Albumin/Creatinine Ratio, Urine, POC    POCT HgB A1C     Status: Abnormal   Collection Time: 12/22/14 10:56 AM  Result Value Ref Range   Hemoglobin A1C 6.2      Assessment & Plan  Problem List Items Addressed This Visit      Cardiovascular and Mediastinum   PAC (premature atrial contraction)   HTN (hypertension)     Endocrine   Diabetes mellitus ([place]) - Primary   Relevant Orders   POCT UA - Microalbumin (Completed)   POCT HgB A1C (Completed)     Other   Lymphoma (HCC) (Chronic)    Other Visit Diagnoses    Immunization due        Relevant Orders    Pneumococcal conjugate vaccine 13-valent       Meds ordered this encounter  Medications  . DISCONTD: metoprolol succinate (TOPROL-XL) 25 MG 24 hr tablet    Sig:     Refill:  5   1. Type 2 diabetes mellitus without complication, without long-term current use of insulin (HCC)  - POCT UA - Microalbumin-20 - POCT HgB A1C-6.2 2. Essential  hypertension   3. PAC (premature atrial contraction)   4. Lymphoma, unspecified body region, unspecified lymphoma type ([place])   5. Immunization due  - Pneumococcal conjugate vaccine 13-valent

## 2014-12-22 NOTE — Patient Instructions (Signed)
Continue to take all current meds and keep appointments with her specialists.

## 2014-12-23 ENCOUNTER — Ambulatory Visit (INDEPENDENT_AMBULATORY_CARE_PROVIDER_SITE_OTHER): Payer: Medicare Other

## 2014-12-23 ENCOUNTER — Ambulatory Visit
Admission: RE | Admit: 2014-12-23 | Discharge: 2014-12-23 | Disposition: A | Discharge status: Home / Self Care | Payer: Medicare Other | Source: Ambulatory Visit | Attending: Cardiovascular Disease | Admitting: Cardiovascular Disease

## 2014-12-23 DIAGNOSIS — E7800 Pure hypercholesterolemia, unspecified: Secondary | ICD-10-CM | POA: Diagnosis not present

## 2014-12-23 DIAGNOSIS — R42 Dizziness and giddiness: Secondary | ICD-10-CM | POA: Diagnosis not present

## 2014-12-23 DIAGNOSIS — Z8679 Personal history of other diseases of the circulatory system: Secondary | ICD-10-CM | POA: Diagnosis not present

## 2014-12-23 DIAGNOSIS — I2510 Atherosclerotic heart disease of native coronary artery without angina pectoris: Secondary | ICD-10-CM | POA: Insufficient documentation

## 2014-12-23 DIAGNOSIS — R002 Palpitations: Secondary | ICD-10-CM | POA: Diagnosis not present

## 2014-12-23 DIAGNOSIS — I10 Essential (primary) hypertension: Secondary | ICD-10-CM | POA: Diagnosis not present

## 2014-12-23 DIAGNOSIS — F329 Major depressive disorder, single episode, unspecified: Secondary | ICD-10-CM | POA: Insufficient documentation

## 2014-12-23 DIAGNOSIS — E119 Type 2 diabetes mellitus without complications: Secondary | ICD-10-CM | POA: Diagnosis not present

## 2014-12-30 ENCOUNTER — Encounter: Payer: Self-pay | Admitting: Cardiovascular Disease

## 2014-12-30 ENCOUNTER — Ambulatory Visit: Payer: Medicare Other | Admitting: Cardiovascular Disease

## 2014-12-30 ENCOUNTER — Ambulatory Visit (INDEPENDENT_AMBULATORY_CARE_PROVIDER_SITE_OTHER): Payer: Medicare Other | Admitting: Cardiovascular Disease

## 2014-12-30 VITALS — BP 160/60 | HR 60 | Ht 62.5 in | Wt 122.0 lb

## 2014-12-30 DIAGNOSIS — I10 Essential (primary) hypertension: Secondary | ICD-10-CM | POA: Diagnosis not present

## 2014-12-30 DIAGNOSIS — R001 Bradycardia, unspecified: Secondary | ICD-10-CM

## 2014-12-30 DIAGNOSIS — C8590 Non-Hodgkin lymphoma, unspecified, unspecified site: Secondary | ICD-10-CM | POA: Diagnosis not present

## 2014-12-30 DIAGNOSIS — R5381 Other malaise: Secondary | ICD-10-CM

## 2014-12-30 DIAGNOSIS — E119 Type 2 diabetes mellitus without complications: Secondary | ICD-10-CM | POA: Diagnosis not present

## 2014-12-30 DIAGNOSIS — E785 Hyperlipidemia, unspecified: Secondary | ICD-10-CM | POA: Diagnosis not present

## 2014-12-30 NOTE — Assessment & Plan Note (Signed)
Hemoglobin A1c 6.2. She has lost significant weight, avoiding large breakfast given symptoms of nausea, dizziness

## 2014-12-30 NOTE — Assessment & Plan Note (Signed)
Heart rate 60 on today's visit. We made several phone calls inquiring about the Holter monitor. This has not been processed yet. Discussed this in detail with the daughter, I suspect that bradycardia is not a major issue as she is maintaining a good blood pressure during her episodes of dizziness as detailed above. We'll follow up on the Holter soon as this becomes available and call a patient with results

## 2014-12-30 NOTE — Progress Notes (Signed)
Patient ID: Caroline Russo, female    DOB: 16-Sep-1919, 81 y.o.   MRN: [id number]  HPI Comments: Caroline Russo is a pleasant 81 year old woman with hypertension, nonobstructive coronary artery disease in 2002, diabetes, obesity who presents for routine followup of her coronary artery disease, hypertension  Diagnosis of lymphoma, Chemotherapy has been completed, she is on Rituxan every 3 months Chronic dizziness,  with previous Blood pressure measurements showing orthostasis.  HCTZ was held in the past. In follow up, losartan dose was decreased.   In follow-up, she presents with her daughter She is not checking blood pressure at home, overall feels weak, not doing her exercises, very sedentary Still having dizziness and chronic nausea after she eats 1/2 her breakfast, no sx with lunch of dinner. Takes reglan in the AM before breakfast (takes this QID). Chronic constipation an issue She is walking with a cane, no recent falls  Holter monitor was ordered, this is not available to evaluate on today's visit, it is still being processed  Other past medical history  fall while in the bathroom 06/09/2014, hit her right eye History ofrecurrent pleural effusion, with Pleurx catheter.  Weight continues to trend lower, previously 158 pounds, down to 150 pounds,  130 pounds, now 122 pounds  Previously treated with Rituxan, then Treanda and Rituxan with steroids  In the hospital May 2015 CT scan showed moderate right pleural effusion, diffuse adenopathy CT scan of the head without metastases Hospital records from 11/11/2013 detail diagnosis of follicular lymphoma with chronic right-sided pleural effusion She was discharged from the hospital October 20, admission on October 17  Chronic problem with her vision. Hearing problem  She has macular degeneration.   Previous history of palpitations and event monitor was performed that showed occasional sinus bradycardia., Normal sinus rhythm mostly with  frequent APCs. No other significant arrhythmia   Allergies  Allergen Reactions  . Moxifloxacin Shortness Of Breath    hallucinations  . Codeine Other (See Comments)    unknown  . Penicillins     Itching  Rash   . Prednisone     Hallucinations  . Unasyn [Ampicillin-Sulbactam Sodium] Other (See Comments)    unknown    Current Outpatient Prescriptions on File Prior to Visit  Medication Sig Dispense Refill  . ALPRAZolam (XANAX) 0.25 MG tablet Take 1 tablet (0.25 mg total) by mouth every 6 (six) hours as needed. 30 tablet 1  . benzonatate (TESSALON) 100 MG capsule Take 100 mg by mouth 3 (three) times daily as needed.   0  . cyclobenzaprine (FLEXERIL) 10 MG tablet Take 10 mg by mouth 2 (two) times daily as needed for muscle spasms.    . Diphenhyd-Hydrocort-Nystatin (FIRST-DUKES MOUTHWASH MT)   2  . docusate sodium (COLACE) 100 MG capsule TAKE ONE CAPSULE BY MOUTH TWICE A DAY 60 capsule 3  . fluticasone (FLONASE) 50 MCG/ACT nasal spray Place 1 spray into both nostrils daily.    Marland Kitchen lidocaine-prilocaine (EMLA) cream Apply 1 application topically as needed. 30 g 3  . losartan (COZAAR) 50 MG tablet Take 1 tablet (50 mg total) by mouth daily. Take after breakfast 30 tablet 3  . magnesium chloride (SLOW-MAG) 64 MG TBEC SR tablet Take 1 tablet (64 mg total) by mouth 2 (two) times daily. 60 tablet 3  . meclizine (ANTIVERT) 12.5 MG tablet Take 12.5 mg by mouth 3 (three) times daily as needed.    . Multiple Vitamin (MULTIVITAMIN WITH MINERALS) TABS tablet Take 1 tablet by mouth daily.    Marland Kitchen  nitroGLYCERIN (NITROSTAT) 0.4 MG SL tablet Place 0.4 mg under the tongue every 5 (five) minutes as needed.      . pantoprazole (PROTONIX) 40 MG tablet TAKE 1 TABLET (40 MG TOTAL) BY MOUTH DAILY. 90 tablet 3  . polyethylene glycol powder (GLYCOLAX/MIRALAX) powder Take 17 g by mouth daily as needed.   3   Current Facility-Administered Medications on File Prior to Visit  Medication Dose Route Frequency Provider Last  Rate Last Dose  . heparin lock flush 100 unit/mL  500 Units Intravenous Once Forest Gleason, MD   500 Units at 07/24/14 1630  . sodium chloride 0.9 % injection 10 mL  10 mL Intravenous PRN Forest Gleason, MD   10 mL at 07/24/14 1020  . sodium chloride 0.9 % injection 10 mL  10 mL Intracatheter PRN Forest Gleason, MD   10 mL at 07/24/14 1020  . sodium chloride 0.9 % injection 10 mL  10 mL Intravenous PRN Forest Gleason, MD   10 mL at 08/12/14 [id number]    Past Medical History  Diagnosis Date  . HTN (hypertension)   . DM2 (diabetes mellitus, type 2) (HCC)     insulin requiring  . Hypercholesterolemia   . Murmur     hx  . Tinnitus     chronic  . Macular degeneration   . Chronic chest pain     a. nonobs cath 2002.  Marland Kitchen Palpitations     a. 01/2011 Holter monitor showed frequent PACs, sinus bradycardia  . Anxiety   . Depression   . Irregular cardiac rhythm   . Heart murmur     a. 09/2014 Echo: EF 55-60%, nl wm, mod MS, mild MR, mod dil LA, mild-mod TR.  . Cataract   . Non Hodgkin's lymphoma ([place])   . Hypomagnesemia 06/13/2014  . Non-obstructive CAD   . Orthostatic hypotension     Past Surgical History  Procedure Laterality Date  . Cardiac catheterization  2002  . Tumor removal  2012    benign neck tumor removed  . Tooth extraction    . Knee surgery Left   . Ankle surgery Left   . Partial hysterectomy    . Insertion / placement pleural catheter      Social History  reports that she quit smoking about 26 years ago. Her smoking use included Cigarettes. She has a 1.75 pack-year smoking history. She has never used smokeless tobacco. She reports that she does not drink alcohol or use illicit drugs.  Family History family history includes Bladder Cancer in her brother; Heart disease in her mother; Lung cancer in her brother; Stomach cancer in her brother.  Review of Systems  Constitutional: Positive for fatigue.  Respiratory: Negative.   Cardiovascular: Negative.   Gastrointestinal:  Negative.   Musculoskeletal: Positive for gait problem.  Skin: Negative.   Allergic/Immunologic: Negative.   Neurological: Positive for dizziness and weakness.  Psychiatric/Behavioral: Negative.   All other systems reviewed and are negative.   BP 160/60 mmHg  Pulse 60  Ht 5' 2.5" (1.588 m)  Wt 122 lb (55.339 kg)  BMI 21.94 kg/m2  Physical Exam  Constitutional: She is oriented to person, place, and time. She appears well-nourished.  Thin woman, percent in a wheelchair, no distress  HENT:  Head: Normocephalic.  Nose: Nose normal.  Mouth/Throat: Oropharynx is clear and moist.  Eyes: Conjunctivae are normal. Pupils are equal, round, and reactive to light.  Neck: Normal range of motion. Neck supple. No JVD present.  Cardiovascular: Normal rate,  regular rhythm, S1 normal, S2 normal and intact distal pulses.  Exam reveals no gallop and no friction rub.   Murmur heard.  Systolic murmur is present with a grade of 1/6  Pulmonary/Chest: Effort normal and breath sounds normal. No respiratory distress. She has no wheezes. She has no rales. She exhibits no tenderness.  Abdominal: Soft. Bowel sounds are normal. She exhibits no distension. There is no tenderness.  Musculoskeletal: Normal range of motion. She exhibits no edema or tenderness.  Lymphadenopathy:    She has no cervical adenopathy.  Neurological: She is alert and oriented to person, place, and time. Coordination normal.  Skin: Skin is warm and dry. No rash noted. No erythema.  Psychiatric: She has a normal mood and affect. Her behavior is normal. Judgment and thought content normal.    Assessment and Plan  Nursing note and vitals reviewed.

## 2014-12-30 NOTE — Patient Instructions (Signed)
You are doing well. No medication changes were made.  I will followup on the holter monitor Consider cutting back on the reglan to check for side effects  Monitor blood pressure Goal 130 to 150s top number  Exercise daily for energy!  Please call us if you have new issues that need to be addressed before your next appt.  Your physician wants you to follow-up in: 6 months.  You will receive a reminder letter in the mail two months in advance. If you don't receive a letter, please call our office to schedule the follow-up appointment.

## 2014-12-30 NOTE — Assessment & Plan Note (Signed)
She has undergone chemotherapy. Managed by oncology Dramatic weight loss

## 2014-12-30 NOTE — Assessment & Plan Note (Signed)
Recent lipid panel not available. Likely not an issue given her dramatic weight loss

## 2014-12-30 NOTE — Assessment & Plan Note (Signed)
She reports having malaise, dizziness after eating breakfast Blood pressure typically normal, [id number] systolic per the daughter Recommended that they continue to monitor her pressure at home as blood pressure is now running high Recommended she restart a regular exercise program. She has been very sedentary, getting weaker

## 2014-12-30 NOTE — Assessment & Plan Note (Signed)
Blood pressure running high on today's visit. Medications are slowly being weaned down given symptoms of dizziness. Daughter reports symptoms of dizziness after eating breakfast may be mildly improved. Recommended she check blood pressure at home

## 2014-12-31 ENCOUNTER — Inpatient Hospital Stay: Payer: Medicare Other | Attending: Oncology

## 2014-12-31 ENCOUNTER — Inpatient Hospital Stay: Payer: Medicare Other

## 2014-12-31 DIAGNOSIS — C8293 Follicular lymphoma, unspecified, intra-abdominal lymph nodes: Secondary | ICD-10-CM

## 2014-12-31 LAB — MAGNESIUM: Magnesium: 1.8 mg/dL (ref 1.7–2.4)

## 2014-12-31 MED ORDER — SODIUM CHLORIDE 0.9 % IJ SOLN
10.0000 mL | Freq: Once | INTRAMUSCULAR | Status: AC
Start: 2014-12-31 — End: 2014-12-31
  Administered 2014-12-31: 10 mL via INTRAVENOUS
  Filled 2014-12-31: qty 10

## 2014-12-31 MED ORDER — HEPARIN SOD (PORK) LOCK FLUSH 100 UNIT/ML IV SOLN
500.0000 [IU] | Freq: Once | INTRAVENOUS | Status: AC
  Administered 2014-12-31: 500 [IU] via INTRAVENOUS

## 2014-12-31 MED ORDER — HEPARIN SOD (PORK) LOCK FLUSH 100 UNIT/ML IV SOLN
INTRAVENOUS | Status: AC
  Filled 2014-12-31: qty 5

## 2015-01-02 ENCOUNTER — Other Ambulatory Visit: Payer: Self-pay | Admitting: Oncology

## 2015-01-12 DIAGNOSIS — J910 Malignant pleural effusion: Secondary | ICD-10-CM | POA: Diagnosis not present

## 2015-01-14 ENCOUNTER — Inpatient Hospital Stay: Payer: Medicare Other | Admitting: Oncology

## 2015-01-14 ENCOUNTER — Inpatient Hospital Stay: Payer: Medicare Other

## 2015-01-27 ENCOUNTER — Telehealth: Payer: Self-pay | Admitting: *Deleted

## 2015-01-27 NOTE — Telephone Encounter (Signed)
Monitor shows normal sinus rhythm with lots of ectopy Rare extra beats from the bottom, frequent extra beats from the top, sometimes short little runs 1 to 6 beats at a time These are not particularly dangerous, can be bothersome

## 2015-01-27 NOTE — Telephone Encounter (Signed)
Pt wore monitor on 12/23/14, results were not available at [facility] 12/30/14. Pt's daughter would like results.

## 2015-01-27 NOTE — Telephone Encounter (Signed)
Left message for [facility] w/ Dr. Donivan Scull recommendation. Asked her to call back tomorrow w/ any questions, as it is currently after office hours.

## 2015-01-27 NOTE — Telephone Encounter (Signed)
Patient's daughter called wanting her heart monitor results. Please call daughter [name].

## 2015-01-30 ENCOUNTER — Telehealth: Payer: Self-pay | Admitting: *Deleted

## 2015-01-30 NOTE — Telephone Encounter (Signed)
Dr Rockey Situ did not want to order Reglan so she is requesting we fill it. After discussing with L Herring, AGNP-C it was decided not to fill it , but to discontinue it since she has completed chemo. Pt informed of this and thanked me for calling

## 2015-01-30 NOTE — Telephone Encounter (Signed)
I do not show pt is on Reglan and [name] states cardiologist has told pt to only take 1 tab daily and not qid. I asked her to contact cardiologist for refill since he has changed her dose. She stated she will do that

## 2015-02-03 ENCOUNTER — Inpatient Hospital Stay: Payer: Medicare Other

## 2015-02-03 ENCOUNTER — Inpatient Hospital Stay: Payer: Medicare Other | Attending: Family Medicine | Admitting: Family Medicine

## 2015-02-03 ENCOUNTER — Encounter: Payer: Self-pay | Admitting: Oncology

## 2015-02-03 DIAGNOSIS — Z87891 Personal history of nicotine dependence: Secondary | ICD-10-CM

## 2015-02-03 DIAGNOSIS — E119 Type 2 diabetes mellitus without complications: Secondary | ICD-10-CM | POA: Diagnosis not present

## 2015-02-03 DIAGNOSIS — Z801 Family history of malignant neoplasm of trachea, bronchus and lung: Secondary | ICD-10-CM

## 2015-02-03 DIAGNOSIS — Z79899 Other long term (current) drug therapy: Secondary | ICD-10-CM | POA: Diagnosis not present

## 2015-02-03 DIAGNOSIS — R079 Chest pain, unspecified: Secondary | ICD-10-CM

## 2015-02-03 DIAGNOSIS — I10 Essential (primary) hypertension: Secondary | ICD-10-CM

## 2015-02-03 DIAGNOSIS — H3530 Unspecified macular degeneration: Secondary | ICD-10-CM | POA: Diagnosis not present

## 2015-02-03 DIAGNOSIS — I951 Orthostatic hypotension: Secondary | ICD-10-CM | POA: Diagnosis not present

## 2015-02-03 DIAGNOSIS — Z800 Family history of malignant neoplasm of digestive organs: Secondary | ICD-10-CM | POA: Diagnosis not present

## 2015-02-03 DIAGNOSIS — F329 Major depressive disorder, single episode, unspecified: Secondary | ICD-10-CM | POA: Diagnosis not present

## 2015-02-03 DIAGNOSIS — I959 Hypotension, unspecified: Secondary | ICD-10-CM

## 2015-02-03 DIAGNOSIS — C8230 Follicular lymphoma grade IIIa, unspecified site: Secondary | ICD-10-CM

## 2015-02-03 DIAGNOSIS — R011 Cardiac murmur, unspecified: Secondary | ICD-10-CM | POA: Diagnosis not present

## 2015-02-03 DIAGNOSIS — H9319 Tinnitus, unspecified ear: Secondary | ICD-10-CM

## 2015-02-03 DIAGNOSIS — E7800 Pure hypercholesterolemia, unspecified: Secondary | ICD-10-CM | POA: Diagnosis not present

## 2015-02-03 DIAGNOSIS — Z5112 Encounter for antineoplastic immunotherapy: Secondary | ICD-10-CM | POA: Insufficient documentation

## 2015-02-03 DIAGNOSIS — I2510 Atherosclerotic heart disease of native coronary artery without angina pectoris: Secondary | ICD-10-CM

## 2015-02-03 DIAGNOSIS — Z8052 Family history of malignant neoplasm of bladder: Secondary | ICD-10-CM

## 2015-02-03 DIAGNOSIS — C8293 Follicular lymphoma, unspecified, intra-abdominal lymph nodes: Secondary | ICD-10-CM

## 2015-02-03 DIAGNOSIS — C8590 Non-Hodgkin lymphoma, unspecified, unspecified site: Secondary | ICD-10-CM

## 2015-02-03 LAB — CBC WITH DIFFERENTIAL/PLATELET
Basophils Absolute: 0.1 10*3/uL (ref 0–0.1)
Basophils Relative: 3 %
EOS PCT: 4 %
Eosinophils Absolute: 0.1 10*3/uL (ref 0–0.7)
HEMATOCRIT: 37.4 % (ref 35.0–47.0)
Hemoglobin: 12.5 g/dL (ref 12.0–16.0)
LYMPHS ABS: 0.8 10*3/uL — AB (ref 1.0–3.6)
Lymphocytes Relative: 42 %
MCH: 29.4 pg (ref 26.0–34.0)
MCHC: 33.5 g/dL (ref 32.0–36.0)
MCV: 87.6 fL (ref 80.0–100.0)
MONOS PCT: 19 %
Monocytes Absolute: 0.4 10*3/uL (ref 0.2–0.9)
Neutro Abs: 0.7 10*3/uL — ABNORMAL LOW (ref 1.4–6.5)
Neutrophils Relative %: 32 %
Platelets: 161 10*3/uL (ref 150–440)
RBC: 4.27 MIL/uL (ref 3.80–5.20)
RDW: 14.5 % (ref 11.5–14.5)
WBC: 2.1 10*3/uL — ABNORMAL LOW (ref 3.6–11.0)

## 2015-02-03 LAB — COMPREHENSIVE METABOLIC PANEL
ALK PHOS: 87 U/L (ref 38–126)
ALT: 10 U/L — AB (ref 14–54)
AST: 19 U/L (ref 15–41)
Albumin: 3.4 g/dL — ABNORMAL LOW (ref 3.5–5.0)
Anion gap: 5 (ref 5–15)
BILIRUBIN TOTAL: 0.5 mg/dL (ref 0.3–1.2)
BUN: 16 mg/dL (ref 6–20)
CALCIUM: 8.8 mg/dL — AB (ref 8.9–10.3)
CO2: 25 mmol/L (ref 22–32)
CREATININE: 0.92 mg/dL (ref 0.44–1.00)
Chloride: 105 mmol/L (ref 101–111)
GFR calc Af Amer: >60 mL/min (ref >60)
GFR, EST NON AFRICAN AMERICAN: 52 mL/min — AB (ref >60)
Glucose, Bld: 139 mg/dL — ABNORMAL HIGH (ref 65–99)
Potassium: 3.6 mmol/L (ref 3.5–5.1)
Sodium: 135 mmol/L (ref 135–145)
TOTAL PROTEIN: 6.3 g/dL — AB (ref 6.5–8.1)

## 2015-02-03 LAB — MAGNESIUM: MAGNESIUM: 1.5 mg/dL — AB (ref 1.7–2.4)

## 2015-02-03 MED ORDER — ACETAMINOPHEN 325 MG PO TABS
650.0000 mg | ORAL_TABLET | Freq: Once | ORAL | Status: AC
  Administered 2015-02-03: 650 mg via ORAL
  Filled 2015-02-03: qty 2

## 2015-02-03 MED ORDER — SODIUM CHLORIDE 0.9 % IV SOLN: 2.0000 g | Freq: Once | INTRAVENOUS | Status: DC

## 2015-02-03 MED ORDER — SODIUM CHLORIDE 0.9 % IJ SOLN
10.0000 mL | INTRAMUSCULAR | Status: DC | PRN
  Administered 2015-02-03: 10 mL via INTRAVENOUS
  Filled 2015-02-03: qty 10

## 2015-02-03 MED ORDER — SODIUM CHLORIDE 0.9 % IV SOLN: 375.0000 mg/m2 | Freq: Once | INTRAVENOUS | Status: DC

## 2015-02-03 MED ORDER — MAGNESIUM SULFATE 2 GM/50ML IV SOLN
2.0000 g | Freq: Once | INTRAVENOUS | Status: AC
  Administered 2015-02-03: 2 g via INTRAVENOUS
  Filled 2015-02-03: qty 50

## 2015-02-03 MED ORDER — DIPHENHYDRAMINE HCL 25 MG PO CAPS
25.0000 mg | ORAL_CAPSULE | Freq: Once | ORAL | Status: AC
  Administered 2015-02-03: 25 mg via ORAL
  Filled 2015-02-03: qty 1

## 2015-02-03 MED ORDER — HEPARIN SOD (PORK) LOCK FLUSH 100 UNIT/ML IV SOLN
500.0000 [IU] | Freq: Once | INTRAVENOUS | Status: DC
  Filled 2015-02-03: qty 5

## 2015-02-03 MED ORDER — SODIUM CHLORIDE 0.9 % IV SOLN
Freq: Once | INTRAVENOUS | Status: AC
  Administered 2015-02-03: 11:00:00 via INTRAVENOUS
  Filled 2015-02-03: qty 1000

## 2015-02-03 MED ORDER — SODIUM CHLORIDE 0.9 % IV SOLN
375.0000 mg/m2 | Freq: Once | INTRAVENOUS | Status: AC
  Administered 2015-02-03: 600 mg via INTRAVENOUS
  Filled 2015-02-03: qty 50

## 2015-02-03 MED ORDER — HEPARIN SOD (PORK) LOCK FLUSH 100 UNIT/ML IV SOLN
500.0000 [IU] | Freq: Once | INTRAVENOUS | Status: AC | PRN
  Administered 2015-02-03: 500 [IU]

## 2015-02-03 NOTE — Progress Notes (Signed)
[place] @ [facility] Telephone:([phone]  Fax:([phone]) [place]: 1919-05-26  MR#: [id number]  MHW#:[id number]  Patient Care Team: Arlis Porta., MD as PCP - General (Unknown Physician Specialty)  CHIEF COMPLAINT:  Chief Complaint  Patient presents with  . Lymphoma    Oncology History   1. Biopsy of para-aortic lymph node is consistent with follicular lymphoma, clinically stage as 3A Diagnosis in July of 2015. Started on rituximab on July 31, 2013 2. Started on [place]  and Rituxan on November 04, 2013 3 maintenance rituximab started from April of 2016      Lymphoma [facility])   12/12/2013 Initial Diagnosis Lymphoma    Oncology Flowsheet 07/24/2014 09/24/2014 11/19/2014  diphenhydrAMINE (BENADRYL) PO 25 mg 25 mg 25 mg  riTUXimab (RITUXAN) IV 375 mg/m2 375 mg/m2 375 mg/m2    INTERVAL HISTORY:  Patient is here for further follow-up and treatment consideration regarding history of follicular lymphoma. Patient is scheduled for Herceptin which she receives every 2 months. Continues to feel weak.  Remains hypomagnesemic. Overall reports feeling fairly well. Continues with some weakness and fatigue with long periods of ambulation. She denies any fever, night sweats, decrease in appetite, or weight loss.  REVIEW OF SYSTEMS:   Gen. status.  patient continues to go weak and tired.  Lungs: Shortness of breath on exertion.   Cardiac: No chest pain.   Skin: No rash.  neurological system: No headache.  No dizziness.  No other focal symptoms.  No lower extremity edema.   Abdomen: No abdominal pain.  No nausea.  No vomiting.  No diarrhea.  No rectal bleeding.. GU: No dysuria or hematuria Lower extremity no swelling.  PAST MEDICAL HISTORY: Past Medical History  Diagnosis Date  . HTN (hypertension)   . DM2 (diabetes mellitus, type 2) (HCC)     insulin requiring  . Hypercholesterolemia   . Murmur     hx  . Tinnitus     chronic  . Macular degeneration   .  Chronic chest pain     a. nonobs cath 2002.  Marland Kitchen Palpitations     a. 01/2011 Holter monitor showed frequent PACs, sinus bradycardia  . Anxiety   . Depression   . Irregular cardiac rhythm   . Heart murmur     a. 09/2014 Echo: EF 55-60%, nl wm, mod MS, mild MR, mod dil LA, mild-mod TR.  . Cataract   . Non Hodgkin's lymphoma ([place])   . Hypomagnesemia 06/13/2014  . Non-obstructive CAD   . Orthostatic hypotension     PAST SURGICAL HISTORY: Past Surgical History  Procedure Laterality Date  . Cardiac catheterization  2002  . Tumor removal  2012    benign neck tumor removed  . Tooth extraction    . Knee surgery Left   . Ankle surgery Left   . Partial hysterectomy    . Insertion / placement pleural catheter      FAMILY HISTORY Family History  Problem Relation Age of Onset  . Heart disease Mother   . Lung cancer Brother     smoked  . Bladder Cancer Brother   . Stomach cancer Brother       ADVANCED DIRECTIVES: Patient does have living will   HEALTH MAINTENANCE: Social History  Substance Use Topics  . Smoking status: Former Smoker -- 0.25 packs/day for 7 years    Types: Cigarettes    Quit date: 01/25/1988  . Smokeless tobacco: Never Used  . Alcohol Use:  No       Allergies  Allergen Reactions  . Moxifloxacin Shortness Of Breath    hallucinations  . Codeine Other (See Comments)    unknown  . Penicillins     Itching  Rash   . Prednisone     Hallucinations  . Unasyn [Ampicillin-Sulbactam Sodium] Other (See Comments)    unknown    Current Outpatient Prescriptions  Medication Sig Dispense Refill  . ALPRAZolam (XANAX) 0.25 MG tablet Take 1 tablet (0.25 mg total) by mouth every 6 (six) hours as needed. 30 tablet 1  . benzonatate (TESSALON) 100 MG capsule Take 100 mg by mouth 3 (three) times daily as needed.   0  . cyclobenzaprine (FLEXERIL) 10 MG tablet Take 10 mg by mouth 2 (two) times daily as needed for muscle spasms.    . Diphenhyd-Hydrocort-Nystatin (FIRST-DUKES  MOUTHWASH MT)   2  . docusate sodium (COLACE) 100 MG capsule TAKE ONE CAPSULE BY MOUTH TWICE A DAY 60 capsule 3  . fluticasone (FLONASE) 50 MCG/ACT nasal spray Place 1 spray into both nostrils daily.    Marland Kitchen lidocaine-prilocaine (EMLA) cream Apply 1 application topically as needed. 30 g 3  . losartan (COZAAR) 50 MG tablet Take 1 tablet (50 mg total) by mouth daily. Take after breakfast 30 tablet 3  . meclizine (ANTIVERT) 12.5 MG tablet Take 12.5 mg by mouth 3 (three) times daily as needed.    . Multiple Vitamin (MULTIVITAMIN WITH MINERALS) TABS tablet Take 1 tablet by mouth daily.    . nitroGLYCERIN (NITROSTAT) 0.4 MG SL tablet Place 0.4 mg under the tongue every 5 (five) minutes as needed.      . pantoprazole (PROTONIX) 40 MG tablet TAKE 1 TABLET (40 MG TOTAL) BY MOUTH DAILY. 90 tablet 3  . polyethylene glycol powder (GLYCOLAX/MIRALAX) powder Take 17 g by mouth daily as needed.   3  . SLOW-MAG 71.5-119 MG TBEC SR tablet TAKE 1 TABLET (64 MG TOTAL) BY MOUTH 2 (TWO) TIMES DAILY. 60 tablet 3   Current Facility-Administered Medications  Medication Dose Route Frequency Provider Last Rate Last Dose  . magnesium sulfate IVPB 2 g 50 mL  2 g Intravenous Once Forest Gleason, MD       Facility-Administered Medications Ordered in Other Visits  Medication Dose Route Frequency Provider Last Rate Last Dose  . heparin lock flush 100 unit/mL  500 Units Intravenous Once Forest Gleason, MD   500 Units at 07/24/14 1630  . heparin lock flush 100 unit/mL  500 Units Intravenous Once Forest Gleason, MD      . sodium chloride 0.9 % injection 10 mL  10 mL Intravenous PRN Forest Gleason, MD   10 mL at 07/24/14 1020  . sodium chloride 0.9 % injection 10 mL  10 mL Intracatheter PRN Forest Gleason, MD   10 mL at 07/24/14 1020  . sodium chloride 0.9 % injection 10 mL  10 mL Intravenous PRN Forest Gleason, MD   10 mL at 08/12/14 0834  . sodium chloride 0.9 % injection 10 mL  10 mL Intravenous PRN Forest Gleason, MD   10 mL at 02/03/15 0858      OBJECTIVE:  Filed Vitals:   02/03/15 0920  BP: 153/71  Pulse: 63  Temp: 96.4 F (35.8 C)     Body mass index is 21.94 kg/(m^2).    ECOG FS:2 - Symptomatic, <50% confined to bed  PHYSICAL EXAM: Gen. status: Patient is in wheelchair.  Lymphatic system: No palpable supraclavicular, cervical, axillary, or inguinal  adenopathy.  Lungs: Diminished air entry on both sides.  No rhonchi.  No rales.  Patient does have Pleurx catheter still draining Cardiac: Normal heart sounds.  Soft systolic murmur. Abdomen: Soft.  No palpable masses no tenderness no ascites liver and spleen not palpable. Lower extremity trace edema Skin: Multiple ecchymosis Neurological system: Higher functions within normal limit.  Cranial nerves are intact.  Motor and sensory systems within normal limit   LAB RESULTS:  Infusion on 02/03/2015  Component Date Value Ref Range Status  . WBC 02/03/2015 2.1* 3.6 - 11.0 K/uL Final  . RBC 02/03/2015 4.27  3.80 - 5.20 MIL/uL Final  . Hemoglobin 02/03/2015 12.5  12.0 - 16.0 g/dL Final  . HCT 02/03/2015 37.4  35.0 - 47.0 % Final  . MCV 02/03/2015 87.6  80.0 - 100.0 fL Final  . MCH 02/03/2015 29.4  26.0 - 34.0 pg Final  . MCHC 02/03/2015 33.5  32.0 - 36.0 g/dL Final  . RDW 02/03/2015 14.5  11.5 - 14.5 % Final  . Platelets 02/03/2015 161  150 - 440 K/uL Final  . Neutrophils Relative % 02/03/2015 32.000000   Final  . Lymphocytes Relative 02/03/2015 42.000000   Final  . Monocytes Relative 02/03/2015 19.000000   Final  . Eosinophils Relative 02/03/2015 4.000000   Final  . Basophils Relative 02/03/2015 3.000000   Final  . Neutro Abs 02/03/2015 0.7* 1.4 - 6.5 K/uL Final  . Lymphs Abs 02/03/2015 0.8* 1.0 - 3.6 K/uL Final  . Monocytes Absolute 02/03/2015 0.4  0.2 - 0.9 K/uL Final  . Eosinophils Absolute 02/03/2015 0.1  0 - 0.7 K/uL Final  . Basophils Absolute 02/03/2015 0.1  0 - 0.1 K/uL Final  . Smear Review 02/03/2015 SMEAR SCANNED   Final  . Sodium 02/03/2015 135  135 -  145 mmol/L Final  . Potassium 02/03/2015 3.6  3.5 - 5.1 mmol/L Final  . Chloride 02/03/2015 105  101 - 111 mmol/L Final  . CO2 02/03/2015 25  22 - 32 mmol/L Final  . Glucose, Bld 02/03/2015 139* 65 - 99 mg/dL Final  . BUN 02/03/2015 16  6 - 20 mg/dL Final  . Creatinine, Ser 02/03/2015 0.92  0.44 - 1.00 mg/dL Final  . Calcium 02/03/2015 8.8* 8.9 - 10.3 mg/dL Final  . Total Protein 02/03/2015 6.3* 6.5 - 8.1 g/dL Final  . Albumin 02/03/2015 3.4* 3.5 - 5.0 g/dL Final  . AST 02/03/2015 19  15 - 41 U/L Final  . ALT 02/03/2015 10* 14 - 54 U/L Final  . Alkaline Phosphatase 02/03/2015 87  38 - 126 U/L Final  . Total Bilirubin 02/03/2015 0.5  0.3 - 1.2 mg/dL Final  . GFR calc non Af Amer 02/03/2015 52* >60 mL/min Final  . GFR calc Af Amer 02/03/2015 >60  >60 mL/min Final   Comment: (NOTE) The eGFR has been calculated using the CKD EPI equation. This calculation has not been validated in all clinical situations. eGFR's persistently <60 mL/min signify possible Chronic Kidney Disease.   . Anion gap 02/03/2015 5  5 - 15 Final  . Magnesium 02/03/2015 1.5* 1.7 - 2.4 mg/dL Final   IMPRESSION: New 8 mm hypermetabolic right external iliac lymph node. No other metabolically active disease identified.   Electronically Signed  By: Earle Gell M.D.  On: 11/17/2014 13:02    ASSESSMENT: 1. Follicular lymphoma. Clinically appears to be in remission on maintenance Rituxan therapy. Continue maintenance rituximab therapy every 2 months. 2. Hypomagnesemia. Magnesium 1.5 today, will receive 2 g IV. If  magnesium is between 1.4 and 1.6 patient will receive 2 g of intravenous magnesium.  If it is less than 1.4 than 4 g intravenously. We'll continue with monthly magnesium checks in lab.  Patient expressed understanding and was in agreement with this plan. She also understands that She can call clinic at any time with any questions, concerns, or complaints.    Evlyn Kanner, NP   02/03/2015 10:28  AM

## 2015-02-09 ENCOUNTER — Telehealth: Payer: Self-pay | Admitting: *Deleted

## 2015-02-09 MED ORDER — DOCUSATE SODIUM 100 MG PO CAPS: 100.0000 mg | ORAL_CAPSULE | Freq: Two times a day (BID) | ORAL | Status: DC

## 2015-02-09 NOTE — Telephone Encounter (Signed)
Escribed

## 2015-02-17 ENCOUNTER — Telehealth: Payer: Self-pay | Admitting: *Deleted

## 2015-02-17 MED ORDER — POLYETHYLENE GLYCOL 3350 17 GM/SCOOP PO POWD: 17.0000 g | Freq: Every day | ORAL | Status: AC | PRN

## 2015-02-17 NOTE — Telephone Encounter (Signed)
Escribed

## 2015-03-03 ENCOUNTER — Inpatient Hospital Stay: Payer: Medicare Other

## 2015-03-05 DIAGNOSIS — J910 Malignant pleural effusion: Secondary | ICD-10-CM | POA: Diagnosis not present

## 2015-03-06 DIAGNOSIS — H6981 Other specified disorders of Eustachian tube, right ear: Secondary | ICD-10-CM | POA: Diagnosis not present

## 2015-03-09 ENCOUNTER — Inpatient Hospital Stay: Payer: Medicare Other | Attending: Oncology

## 2015-03-09 DIAGNOSIS — C8230 Follicular lymphoma grade IIIa, unspecified site: Secondary | ICD-10-CM | POA: Insufficient documentation

## 2015-03-09 LAB — COMPREHENSIVE METABOLIC PANEL
ALK PHOS: 83 U/L (ref 38–126)
ALT: 11 U/L — ABNORMAL LOW (ref 14–54)
ANION GAP: 7 (ref 5–15)
AST: 26 U/L (ref 15–41)
Albumin: 3.6 g/dL (ref 3.5–5.0)
BUN: 13 mg/dL (ref 6–20)
CALCIUM: 9 mg/dL (ref 8.9–10.3)
CO2: 25 mmol/L (ref 22–32)
Chloride: 105 mmol/L (ref 101–111)
Creatinine, Ser: 1 mg/dL (ref 0.44–1.00)
GFR, EST AFRICAN AMERICAN: 55 mL/min — AB (ref >60)
GFR, EST NON AFRICAN AMERICAN: 47 mL/min — AB (ref >60)
Glucose, Bld: 155 mg/dL — ABNORMAL HIGH (ref 65–99)
Potassium: 4.2 mmol/L (ref 3.5–5.1)
SODIUM: 137 mmol/L (ref 135–145)
Total Bilirubin: 0.7 mg/dL (ref 0.3–1.2)
Total Protein: 6.5 g/dL (ref 6.5–8.1)

## 2015-03-09 LAB — CBC WITH DIFFERENTIAL/PLATELET
BASOS ABS: 0.1 10*3/uL (ref 0–0.1)
Basophils Relative: 3 %
EOS ABS: 0 10*3/uL (ref 0–0.7)
Eosinophils Relative: 2 %
HEMATOCRIT: 39.1 % (ref 35.0–47.0)
Hemoglobin: 13 g/dL (ref 12.0–16.0)
LYMPHS ABS: 0.8 10*3/uL — AB (ref 1.0–3.6)
Lymphocytes Relative: 41 %
MCH: 28.6 pg (ref 26.0–34.0)
MCHC: 33.2 g/dL (ref 32.0–36.0)
MCV: 86.2 fL (ref 80.0–100.0)
Monocytes Absolute: 0.3 10*3/uL (ref 0.2–0.9)
Monocytes Relative: 17 %
NEUTROS ABS: 0.7 10*3/uL — AB (ref 1.4–6.5)
Neutrophils Relative %: 37 %
Platelets: 182 10*3/uL (ref 150–440)
RBC: 4.54 MIL/uL (ref 3.80–5.20)
RDW: 14 % (ref 11.5–14.5)
WBC: 1.9 10*3/uL — ABNORMAL LOW (ref 3.6–11.0)

## 2015-03-09 LAB — MAGNESIUM: MAGNESIUM: 1.6 mg/dL — AB (ref 1.7–2.4)

## 2015-03-17 ENCOUNTER — Encounter: Payer: Self-pay | Admitting: Family Medicine

## 2015-03-17 ENCOUNTER — Ambulatory Visit (INDEPENDENT_AMBULATORY_CARE_PROVIDER_SITE_OTHER): Payer: Medicare Other | Admitting: Family Medicine

## 2015-03-17 VITALS — BP 122/70 | HR 59 | Temp 98.3°F | Resp 16 | Wt 119.3 lb

## 2015-03-17 DIAGNOSIS — R300 Dysuria: Secondary | ICD-10-CM | POA: Diagnosis not present

## 2015-03-17 DIAGNOSIS — R350 Frequency of micturition: Secondary | ICD-10-CM

## 2015-03-17 LAB — POCT URINALYSIS DIPSTICK
BILIRUBIN UA: NEGATIVE
GLUCOSE UA: NEGATIVE
Ketones, UA: 5
Leukocytes, UA: NEGATIVE
NITRITE UA: NEGATIVE
Protein, UA: NEGATIVE
RBC UA: NEGATIVE
SPEC GRAV UA: 1.025
Urobilinogen, UA: 0.2
pH, UA: 5

## 2015-03-17 NOTE — Progress Notes (Signed)
Subjective:    Patient ID: Caroline Russo, female    DOB: [birth year], 82 y.o.   MRN: [id number]  HPI: Caroline Russo is a 82 y.o. female presenting on 03/17/2015 for Urinary Tract Infection   HPI  Pt presents for possible urinary tract infection. Pt reports urinary frequency- voiding about every 20 minutes x2 weeks. No burning. No pelvic pain or low back pain. No fevers. No blood in urine. No recent medication. Drinking coffee and OJ for breakfast. Drinks mostly water or sweet tea. No Falls. No AMS or confusion reported by caregiver. Pt does report feeling occasionally nervous- is wondering if that is the cause.   Past Medical History  Diagnosis Date  . HTN (hypertension)   . DM2 (diabetes mellitus, type 2) (HCC)     insulin requiring  . Hypercholesterolemia   . Murmur     hx  . Tinnitus     chronic  . Macular degeneration   . Chronic chest pain     a. nonobs cath 2002.  Marland Kitchen Palpitations     a. 01/2011 Holter monitor showed frequent PACs, sinus bradycardia  . Anxiety   . Depression   . Irregular cardiac rhythm   . Heart murmur     a. 09/2014 Echo: EF 55-60%, nl wm, mod MS, mild MR, mod dil LA, mild-mod TR.  . Cataract   . Non Hodgkin's lymphoma ([place])   . Hypomagnesemia 06/13/2014  . Non-obstructive CAD   . Orthostatic hypotension     Current Outpatient Prescriptions on File Prior to Visit  Medication Sig  . ALPRAZolam (XANAX) 0.25 MG tablet Take 1 tablet (0.25 mg total) by mouth every 6 (six) hours as needed.  . cyclobenzaprine (FLEXERIL) 10 MG tablet Take 10 mg by mouth 2 (two) times daily as needed for muscle spasms.  . Diphenhyd-Hydrocort-Nystatin (FIRST-DUKES MOUTHWASH MT)   . fluticasone (FLONASE) 50 MCG/ACT nasal spray Place 1 spray into both nostrils daily.  Marland Kitchen losartan (COZAAR) 50 MG tablet Take 1 tablet (50 mg total) by mouth daily. Take after breakfast  . meclizine (ANTIVERT) 12.5 MG tablet Take 12.5 mg by mouth 3 (three) times daily as needed.  . nitroGLYCERIN  (NITROSTAT) 0.4 MG SL tablet Place 0.4 mg under the tongue every 5 (five) minutes as needed.    Marland Kitchen SLOW-MAG 71.5-119 MG TBEC SR tablet TAKE 1 TABLET (64 MG TOTAL) BY MOUTH 2 (TWO) TIMES DAILY.  Marland Kitchen lidocaine-prilocaine (EMLA) cream Apply 1 application topically as needed. (Patient not taking: Reported on 03/17/2015)  . Multiple Vitamin (MULTIVITAMIN WITH MINERALS) TABS tablet Take 1 tablet by mouth daily.  . pantoprazole (PROTONIX) 40 MG tablet TAKE 1 TABLET (40 MG TOTAL) BY MOUTH DAILY. (Patient not taking: Reported on 03/17/2015)  . polyethylene glycol powder (GLYCOLAX/MIRALAX) powder Take 17 g by mouth daily as needed. (Patient not taking: Reported on 03/17/2015)   Current Facility-Administered Medications on File Prior to Visit  Medication  . heparin lock flush 100 unit/mL  . sodium chloride 0.9 % injection 10 mL  . sodium chloride 0.9 % injection 10 mL  . sodium chloride 0.9 % injection 10 mL    Review of Systems  Constitutional: Negative for fever and chills.  HENT: Negative.   Respiratory: Negative for cough, chest tightness and wheezing.   Cardiovascular: Negative for chest pain and leg swelling.  Gastrointestinal: Negative for nausea, vomiting, abdominal pain, diarrhea and constipation.  Endocrine: Negative.  Negative for cold intolerance, heat intolerance, polydipsia, polyphagia and polyuria.  Genitourinary: Positive  for frequency. Negative for dysuria, urgency, flank pain, decreased urine volume, difficulty urinating and pelvic pain.  Musculoskeletal: Negative.   Neurological: Negative for dizziness, light-headedness and numbness.  Psychiatric/Behavioral: Negative.    Per HPI unless specifically indicated above     Objective:    BP 122/70 mmHg  Pulse 59  Temp(Src) 98.3 F (36.8 C) (Oral)  Resp 16  Wt 119 lb 4.8 oz (54.114 kg)  Wt Readings from Last 3 Encounters:  03/17/15 119 lb 4.8 oz (54.114 kg)  02/03/15 122 lb (55.339 kg)  12/30/14 122 lb (55.339 kg)    Physical  Exam  Constitutional: She is oriented to person, place, and time. She appears well-developed and well-nourished.  HENT:  Head: Normocephalic and atraumatic.  Neck: Neck supple.  Cardiovascular: Normal rate, regular rhythm and normal heart sounds.  Exam reveals no gallop and no friction rub.   No murmur heard. Pulmonary/Chest: Effort normal and breath sounds normal. She has no wheezes. She exhibits no tenderness.  Abdominal: Soft. Normal appearance and bowel sounds are normal. She exhibits no distension and no mass. There is no tenderness. There is no rebound, no guarding and no CVA tenderness.  Musculoskeletal: Normal range of motion. She exhibits no edema or tenderness.  Lymphadenopathy:    She has no cervical adenopathy.  Neurological: She is alert and oriented to person, place, and time.  Skin: Skin is warm and dry.   Results for orders placed or performed in visit on 03/17/15  POCT urinalysis dipstick  Result Value Ref Range   Color, UA straw    Clarity, UA clear.    Glucose, UA neg    Bilirubin, UA neg    Ketones, UA 5    Spec Grav, UA 1.025    Blood, UA neg    pH, UA 5.0    Protein, UA neg    Urobilinogen, UA 0.2    Nitrite, UA neg    Leukocytes, UA Negative Negative      Assessment & Plan:   Problem List Items Addressed This Visit    None    Visit Diagnoses    Urinary frequency    -  Primary    UA is clear. UC to r/o infection. No basis for empiric treatment due to clean UA. Likley 2/2 bladder irrtants or nerves. Counseled pt pt drink only water for a days avoid caffeine. Alarm symptoms reviewed with patient and caregiver. Return if not improving.     Relevant Orders    POCT urinalysis dipstick (Completed)    CULTURE, URINE COMPREHENSIVE       No orders of the defined types were placed in this encounter.      Follow up plan: Return if symptoms worsen or fail to improve.

## 2015-03-17 NOTE — Patient Instructions (Signed)
I don't think you have an infection in your bladder today. I think it is related to what you are drinking. Try drinking only water for a few days. We will culture your urine to ensure there is no infection.  If you develop altered mental status, confusion, or excessive sleepiness, severe flank pain, blood in the urine, fever, nausea or vomiting, please seek immediate medical attention in the ER.

## 2015-03-19 ENCOUNTER — Telehealth: Payer: Self-pay | Admitting: *Deleted

## 2015-03-19 LAB — CULTURE, URINE COMPREHENSIVE

## 2015-03-19 MED ORDER — MIRTAZAPINE 15 MG PO TABS: 15.0000 mg | ORAL_TABLET | Freq: Every day | ORAL | Status: DC

## 2015-03-19 NOTE — Telephone Encounter (Signed)
Remeron 15 mg q hs ordered and left msg on vm that it was sent to [facility] in [place]

## 2015-03-19 NOTE — Telephone Encounter (Signed)
Caroline Russo went to see her PCP and was discussing her mother with him. It was suggested that perhaps her mother would benefit form being started on Remeron for depression and poor appetite. She reports that she hardly eats anything and that everything she says is a negative. Please advise

## 2015-03-25 DIAGNOSIS — H353133 Nonexudative age-related macular degeneration, bilateral, advanced atrophic without subfoveal involvement: Secondary | ICD-10-CM | POA: Diagnosis not present

## 2015-03-25 DIAGNOSIS — H353190 Nonexudative age-related macular degeneration, unspecified eye, stage unspecified: Secondary | ICD-10-CM | POA: Diagnosis not present

## 2015-03-31 ENCOUNTER — Inpatient Hospital Stay: Payer: Medicare Other

## 2015-03-31 ENCOUNTER — Encounter: Payer: Self-pay | Admitting: Oncology

## 2015-03-31 ENCOUNTER — Inpatient Hospital Stay: Payer: Medicare Other | Attending: Oncology | Admitting: Oncology

## 2015-03-31 VITALS — BP 152/75 | HR 62 | Temp 97.2°F | Resp 18 | Wt 124.0 lb

## 2015-03-31 DIAGNOSIS — Z5112 Encounter for antineoplastic immunotherapy: Secondary | ICD-10-CM | POA: Diagnosis not present

## 2015-03-31 DIAGNOSIS — C8290 Follicular lymphoma, unspecified, unspecified site: Secondary | ICD-10-CM | POA: Diagnosis not present

## 2015-03-31 DIAGNOSIS — C8590 Non-Hodgkin lymphoma, unspecified, unspecified site: Secondary | ICD-10-CM

## 2015-03-31 DIAGNOSIS — Z87891 Personal history of nicotine dependence: Secondary | ICD-10-CM

## 2015-03-31 DIAGNOSIS — E7800 Pure hypercholesterolemia, unspecified: Secondary | ICD-10-CM | POA: Diagnosis not present

## 2015-03-31 DIAGNOSIS — I499 Cardiac arrhythmia, unspecified: Secondary | ICD-10-CM

## 2015-03-31 DIAGNOSIS — R011 Cardiac murmur, unspecified: Secondary | ICD-10-CM

## 2015-03-31 DIAGNOSIS — Z79899 Other long term (current) drug therapy: Secondary | ICD-10-CM | POA: Diagnosis not present

## 2015-03-31 DIAGNOSIS — R001 Bradycardia, unspecified: Secondary | ICD-10-CM

## 2015-03-31 DIAGNOSIS — E119 Type 2 diabetes mellitus without complications: Secondary | ICD-10-CM | POA: Diagnosis not present

## 2015-03-31 DIAGNOSIS — F418 Other specified anxiety disorders: Secondary | ICD-10-CM

## 2015-03-31 DIAGNOSIS — D709 Neutropenia, unspecified: Secondary | ICD-10-CM

## 2015-03-31 DIAGNOSIS — C8230 Follicular lymphoma grade IIIa, unspecified site: Secondary | ICD-10-CM

## 2015-03-31 DIAGNOSIS — I10 Essential (primary) hypertension: Secondary | ICD-10-CM

## 2015-03-31 LAB — CBC WITH DIFFERENTIAL/PLATELET
BASOS ABS: 0 10*3/uL (ref 0–0.1)
EOS ABS: 0.1 10*3/uL (ref 0–0.7)
Eosinophils Relative: 4 %
HCT: 34.2 % — ABNORMAL LOW (ref 35.0–47.0)
Hemoglobin: 11.6 g/dL — ABNORMAL LOW (ref 12.0–16.0)
Lymphocytes Relative: 44 %
Lymphs Abs: 0.8 10*3/uL — ABNORMAL LOW (ref 1.0–3.6)
MCH: 29.3 pg (ref 26.0–34.0)
MCHC: 33.9 g/dL (ref 32.0–36.0)
MCV: 86.5 fL (ref 80.0–100.0)
MONO ABS: 0.4 10*3/uL (ref 0.2–0.9)
NEUTROS ABS: 0.6 10*3/uL — AB (ref 1.4–6.5)
Neutrophils Relative %: 30 %
PLATELETS: 115 10*3/uL — AB (ref 150–440)
RBC: 3.96 MIL/uL (ref 3.80–5.20)
RDW: 14 % (ref 11.5–14.5)
WBC: 1.9 10*3/uL — ABNORMAL LOW (ref 3.6–11.0)

## 2015-03-31 LAB — COMPREHENSIVE METABOLIC PANEL
ALBUMIN: 3.3 g/dL — AB (ref 3.5–5.0)
ALT: 9 U/L — ABNORMAL LOW (ref 14–54)
ANION GAP: 5 (ref 5–15)
AST: 18 U/L (ref 15–41)
Alkaline Phosphatase: 86 U/L (ref 38–126)
BUN: 14 mg/dL (ref 6–20)
CALCIUM: 8.3 mg/dL — AB (ref 8.9–10.3)
CHLORIDE: 103 mmol/L (ref 101–111)
CO2: 25 mmol/L (ref 22–32)
Creatinine, Ser: 0.73 mg/dL (ref 0.44–1.00)
GFR calc non Af Amer: >60 mL/min (ref >60)
GLUCOSE: 157 mg/dL — AB (ref 65–99)
POTASSIUM: 3.6 mmol/L (ref 3.5–5.1)
SODIUM: 133 mmol/L — AB (ref 135–145)
Total Bilirubin: 0.5 mg/dL (ref 0.3–1.2)
Total Protein: 5.8 g/dL — ABNORMAL LOW (ref 6.5–8.1)

## 2015-03-31 LAB — MAGNESIUM: MAGNESIUM: 1.4 mg/dL — AB (ref 1.7–2.4)

## 2015-03-31 LAB — VITAMIN B12: VITAMIN B 12: 875 pg/mL (ref 180–914)

## 2015-03-31 LAB — FOLATE: Folate: 9.3 ng/mL (ref >5.9)

## 2015-03-31 MED ORDER — ACETAMINOPHEN 325 MG PO TABS
650.0000 mg | ORAL_TABLET | Freq: Once | ORAL | Status: AC
Start: 2015-03-31 — End: 2015-03-31
  Administered 2015-03-31: 650 mg via ORAL
  Filled 2015-03-31: qty 2

## 2015-03-31 MED ORDER — HEPARIN SOD (PORK) LOCK FLUSH 100 UNIT/ML IV SOLN
500.0000 [IU] | Freq: Once | INTRAVENOUS | Status: AC | PRN
  Administered 2015-03-31: 500 [IU]

## 2015-03-31 MED ORDER — DIPHENHYDRAMINE HCL 25 MG PO CAPS
25.0000 mg | ORAL_CAPSULE | Freq: Once | ORAL | Status: AC
  Administered 2015-03-31: 25 mg via ORAL
  Filled 2015-03-31: qty 1

## 2015-03-31 MED ORDER — SODIUM CHLORIDE 0.9 % IV SOLN
375.0000 mg/m2 | Freq: Once | INTRAVENOUS | Status: AC
  Administered 2015-03-31: 600 mg via INTRAVENOUS
  Filled 2015-03-31: qty 50

## 2015-03-31 MED ORDER — HEPARIN SOD (PORK) LOCK FLUSH 100 UNIT/ML IV SOLN
500.0000 [IU] | Freq: Once | INTRAVENOUS | Status: DC
  Filled 2015-03-31: qty 5

## 2015-03-31 MED ORDER — SODIUM CHLORIDE 0.9 % IV SOLN: 375.0000 mg/m2 | Freq: Once | INTRAVENOUS | Status: DC

## 2015-03-31 MED ORDER — SODIUM CHLORIDE 0.9% FLUSH
10.0000 mL | Freq: Once | INTRAVENOUS | Status: AC
  Administered 2015-03-31: 10 mL via INTRAVENOUS
  Filled 2015-03-31: qty 10

## 2015-03-31 MED ORDER — SODIUM CHLORIDE 0.9 % IV SOLN
Freq: Once | INTRAVENOUS | Status: AC
  Administered 2015-03-31: 11:00:00 via INTRAVENOUS
  Filled 2015-03-31: qty 1000

## 2015-03-31 MED ORDER — MAGNESIUM SULFATE 2 GM/50ML IV SOLN
2.0000 g | Freq: Once | INTRAVENOUS | Status: AC
  Administered 2015-03-31: 2 g via INTRAVENOUS
  Filled 2015-03-31: qty 50

## 2015-03-31 NOTE — Progress Notes (Signed)
[place] @ [facility] [phone]  Fax:([phone]) [place]: 1919/05/16  MR#: [id number]  LKJ#:[id number]  Patient Care Team: Arlis Porta., MD as PCP - General (Unknown Physician Specialty)  CHIEF COMPLAINT:  Chief Complaint  Patient presents with  . Follicular lymphoma    Oncology History   1. Biopsy of para-aortic lymph node is consistent with follicular lymphoma, clinically stage as 3A Diagnosis in July of 2015. Started on rituximab on July 31, 2013 2. Started on [place]  and Rituxan on November 04, 2013 3 maintenance rituximab started from April of 2016      Lymphoma [facility])   12/12/2013 Initial Diagnosis Lymphoma    Oncology Flowsheet 07/24/2014 09/24/2014 11/19/2014 02/03/2015  diphenhydrAMINE (BENADRYL) PO 25 mg 25 mg 25 mg 25 mg  riTUXimab (RITUXAN) IV 375 mg/m2 375 mg/m2 375 mg/m2 375 mg/m2    INTERVAL HISTORY: 82 year old lady with a history of follicular lymphoma recently fell down.  Patient is gradually improving.  Has gained weight. No chills fever appetite has been improving.  Patient had dental treatment done with all teeth removed following that patient can eat better.  No nausea no vomiting no diarrhea.  Family reports once a week Pleurx fluid drainage going up to 75 cc at that time. No chest pain.  No nausea.  No chills or fever  REVIEW OF SYSTEMS:   Gen. status.  General condition is somewhat improved.  At home patient is ambulating.  Lungs: Shortness of breath on exertion.  Cardiac: No chest pain.  Skin: No rash.  Multiple ecchymotic or area\neurological system: No headache.  No dizziness.  No other focal symptoms.  No lower extremity edema.  Abdomen: No abdominal pain.  No nausea.  No vomiting.  No diarrhea.  No rectal bleeding.. GU: No dysuria or hematuria Lower extremity no swelling.  After all teeth removed and getting new denture patient is eating somewhat better Musculoskeletal system intrascapular pain Neurological  system: No occasional dizziness.  No other focal signs patienr Intrascapular pain associated with breathing and coughing Patient has a Pleurx catheter and had 200cc of pleural fluid removed As per HPI. Otherwise, a complete review of systems is negatve.  PAST MEDICAL HISTORY: Past Medical History  Diagnosis Date  . HTN (hypertension)   . DM2 (diabetes mellitus, type 2) (HCC)     insulin requiring  . Hypercholesterolemia   . Murmur     hx  . Tinnitus     chronic  . Macular degeneration   . Chronic chest pain     a. nonobs cath 2002.  Marland Kitchen Palpitations     a. 01/2011 Holter monitor showed frequent PACs, sinus bradycardia  . Anxiety   . Depression   . Irregular cardiac rhythm   . Heart murmur     a. 09/2014 Echo: EF 55-60%, nl wm, mod MS, mild MR, mod dil LA, mild-mod TR.  . Cataract   . Non Hodgkin's lymphoma ([place])   . Hypomagnesemia 06/13/2014  . Non-obstructive CAD   . Orthostatic hypotension     PAST SURGICAL HISTORY: Past Surgical History  Procedure Laterality Date  . Cardiac catheterization  2002  . Tumor removal  2012    benign neck tumor removed  . Tooth extraction    . Knee surgery Left   . Ankle surgery Left   . Partial hysterectomy    . Insertion / placement pleural catheter      FAMILY HISTORY Family History  Problem Relation  Age of Onset  . Heart disease Mother   . Lung cancer Brother     smoked  . Bladder Cancer Brother   . Stomach cancer Brother       ADVANCED DIRECTIVES: Patient does have living will   HEALTH MAINTENANCE: Social History  Substance Use Topics  . Smoking status: Former Smoker -- 0.25 packs/day for 7 years    Types: Cigarettes    Quit date: 01/25/1988  . Smokeless tobacco: Never Used  . Alcohol Use: No       Allergies  Allergen Reactions  . Moxifloxacin Shortness Of Breath    hallucinations  . Codeine Other (See Comments)    unknown  . Penicillins     Itching  Rash   . Prednisone     Hallucinations  . Unasyn  [Ampicillin-Sulbactam Sodium] Other (See Comments)    unknown    Current Outpatient Prescriptions  Medication Sig Dispense Refill  . ALPRAZolam (XANAX) 0.25 MG tablet Take 1 tablet (0.25 mg total) by mouth every 6 (six) hours as needed. 30 tablet 1  . cyclobenzaprine (FLEXERIL) 10 MG tablet Take 10 mg by mouth 2 (two) times daily as needed for muscle spasms.    . Diphenhyd-Hydrocort-Nystatin (FIRST-DUKES MOUTHWASH MT)   2  . fluticasone (FLONASE) 50 MCG/ACT nasal spray Place 1 spray into both nostrils daily.    Marland Kitchen lidocaine-prilocaine (EMLA) cream Apply 1 application topically as needed. 30 g 3  . losartan (COZAAR) 50 MG tablet Take 1 tablet (50 mg total) by mouth daily. Take after breakfast 30 tablet 3  . meclizine (ANTIVERT) 12.5 MG tablet Take 12.5 mg by mouth 3 (three) times daily as needed.    . mirtazapine (REMERON) 15 MG tablet Take 1 tablet (15 mg total) by mouth at bedtime. 15 tablet 0  . Multiple Vitamin (MULTIVITAMIN WITH MINERALS) TABS tablet Take 1 tablet by mouth daily.    . nitroGLYCERIN (NITROSTAT) 0.4 MG SL tablet Place 0.4 mg under the tongue every 5 (five) minutes as needed.      . pantoprazole (PROTONIX) 40 MG tablet TAKE 1 TABLET (40 MG TOTAL) BY MOUTH DAILY. 90 tablet 3  . polyethylene glycol powder (GLYCOLAX/MIRALAX) powder Take 17 g by mouth daily as needed. 3350 g 0  . SLOW-MAG 71.5-119 MG TBEC SR tablet TAKE 1 TABLET (64 MG TOTAL) BY MOUTH 2 (TWO) TIMES DAILY. 60 tablet 3  . VICODIN 5-300 MG TABS TK 1 OR 2 TS PO Q 4-6 H PRF DISCOMFORT  0   No current facility-administered medications for this visit.   Facility-Administered Medications Ordered in Other Visits  Medication Dose Route Frequency Provider Last Rate Last Dose  . heparin lock flush 100 unit/mL  500 Units Intravenous Once Forest Gleason, MD   500 Units at 07/24/14 1630  . heparin lock flush 100 unit/mL  500 Units Intravenous Once Forest Gleason, MD      . sodium chloride 0.9 % injection 10 mL  10 mL Intravenous  PRN Forest Gleason, MD   10 mL at 07/24/14 1020  . sodium chloride 0.9 % injection 10 mL  10 mL Intracatheter PRN Forest Gleason, MD   10 mL at 07/24/14 1020  . sodium chloride 0.9 % injection 10 mL  10 mL Intravenous PRN Forest Gleason, MD   10 mL at 08/12/14 0834    OBJECTIVE:  Filed Vitals:   03/31/15 0957  BP: 152/75  Pulse: 62  Temp: 97.2 F (36.2 C)  Resp: 18  Body mass index is 22.3 kg/(m^2).    ECOG FS:2 - Symptomatic, <50% confined to bed  PHYSICAL EXAM: Gen. status: Patient is in wheelchair.  Lymphatic system: No palpable supraclavicular cervical axillary inguinal adenopathy.  Lungs: Diminished air entry on both sides.  No rhonchi.  No rales.  Patient does have Pleurx catheter still draining well pleural rub  Cardiac: Normal heart sounds.  Soft systolic murmur. Lymphatic system: Supraclavicular, cervical, axillary, inguinal lymph nodes are not palpable Abdomen: Soft.  No palpable masses no tenderness no ascites liver and spleen not palpable. Lower extremity trace edema Skin: Multiple ecchymosis Neurological system: Higher functions within normal limit.  Cranial nerves are intact.  Motor and sensory systems within normal limit All other systems were examined HEENT: No abnormality HEENT: No evidence of any infection in the gums or teeth.  No soreness in the mouth Cardiac: No case no irregular heart sounds   LAB RESULTS:  Infusion on 03/31/2015  Component Date Value Ref Range Status  . WBC 03/31/2015 1.9* 3.6 - 11.0 K/uL Final  . RBC 03/31/2015 3.96  3.80 - 5.20 MIL/uL Final  . Hemoglobin 03/31/2015 11.6* 12.0 - 16.0 g/dL Final  . HCT 03/31/2015 34.2* 35.0 - 47.0 % Final  . MCV 03/31/2015 86.5  80.0 - 100.0 fL Final  . MCH 03/31/2015 29.3  26.0 - 34.0 pg Final  . MCHC 03/31/2015 33.9  32.0 - 36.0 g/dL Final  . RDW 03/31/2015 14.0  11.5 - 14.5 % Final  . Platelets 03/31/2015 115* 150 - 440 K/uL Final  . Neutrophils Relative % 03/31/2015 30%   Final  . Neutro Abs  03/31/2015 0.6* 1.4 - 6.5 K/uL Final  . Lymphocytes Relative 03/31/2015 44%   Final  . Lymphs Abs 03/31/2015 0.8* 1.0 - 3.6 K/uL Final  . Monocytes Relative 03/31/2015 20%   Final  . Monocytes Absolute 03/31/2015 0.4  0.2 - 0.9 K/uL Final  . Eosinophils Relative 03/31/2015 4%   Final  . Eosinophils Absolute 03/31/2015 0.1  0 - 0.7 K/uL Final  . Basophils Relative 03/31/2015 2%   Final  . Basophils Absolute 03/31/2015 0.0  0 - 0.1 K/uL Final  . Magnesium 03/31/2015 1.4* 1.7 - 2.4 mg/dL Final  . Sodium 03/31/2015 133* 135 - 145 mmol/L Final  . Potassium 03/31/2015 3.6  3.5 - 5.1 mmol/L Final  . Chloride 03/31/2015 103  101 - 111 mmol/L Final  . CO2 03/31/2015 25  22 - 32 mmol/L Final  . Glucose, Bld 03/31/2015 157* 65 - 99 mg/dL Final  . BUN 03/31/2015 14  6 - 20 mg/dL Final  . Creatinine, Ser 03/31/2015 0.73  0.44 - 1.00 mg/dL Final  . Calcium 03/31/2015 8.3* 8.9 - 10.3 mg/dL Final  . Total Protein 03/31/2015 5.8* 6.5 - 8.1 g/dL Final  . Albumin 03/31/2015 3.3* 3.5 - 5.0 g/dL Final  . AST 03/31/2015 18  15 - 41 U/L Final  . ALT 03/31/2015 9* 14 - 54 U/L Final  . Alkaline Phosphatase 03/31/2015 86  38 - 126 U/L Final  . Total Bilirubin 03/31/2015 0.5  0.3 - 1.2 mg/dL Final  . GFR calc non Af Amer 03/31/2015 >60  >60 mL/min Final  . GFR calc Af Amer 03/31/2015 >60  >60 mL/min Final   Comment: (NOTE) The eGFR has been calculated using the CKD EPI equation. This calculation has not been validated in all clinical situations. eGFR's persistently <60 mL/min signify possible Chronic Kidney Disease.   . Anion gap 03/31/2015 5  5 -  73 Final       ASSESSMENT: 63.  82 year old lady with follicular lymphoma appears to be in remission on maintenance Rituxan therapy,  Will continue maintenance rituximab therapy and repeat PET scan prior to next appointment  On clinical grounds patient does not have any evidence of recurrent or progressive disease/.  2.  Electrolyte imbalance Hypokinemia  is improved.  Potassium is 3.6 Hypomagnesemia technetium labeled is 1.4 She will receive 2 g of intravenous magnesium IV 3.    pleurex   catheter drainage  Gradually decreasing.  Family  was advised to now drain fluid every 2 weeks apart as long as this less than 200 cc  4.  Neutropenia exact etiology is not clear Has remained stable over.  Of several months  If it continues to be low, we will check bone marrow aspiration biopsy and may consider discontinuing rituximab therapy  Particularly rituximab can be discontinued if next PET scan shows no evidence of recurrent disease  Will also check vitamin A56 and folic acid regarding neutropenia. She was instructed to call me if spikes any high fever   This all instruction has been discussed with patient's primary caregiver who is daughter.  All of the lab data has been reviewed      No matching staging information was found for the patient.  Forest Gleason, MD   03/31/2015 10:20 AM    .:  .

## 2015-04-29 ENCOUNTER — Telehealth: Payer: Self-pay | Admitting: *Deleted

## 2015-04-29 NOTE — Telephone Encounter (Signed)
Pt called to ask if needed to have port flush before appt 5/9. Informed pt that can wait until appt on 5/9 to get port flushed. Pt verbalized understanding.

## 2015-05-04 ENCOUNTER — Other Ambulatory Visit: Payer: Self-pay | Admitting: Nurse Practitioner

## 2015-05-07 ENCOUNTER — Ambulatory Visit (INDEPENDENT_AMBULATORY_CARE_PROVIDER_SITE_OTHER): Payer: Medicare Other | Admitting: Family Medicine

## 2015-05-07 ENCOUNTER — Encounter: Payer: Self-pay | Admitting: Family Medicine

## 2015-05-07 VITALS — BP 131/70 | HR 76 | Temp 98.7°F | Resp 16 | Ht 63.5 in | Wt 121.6 lb

## 2015-05-07 DIAGNOSIS — J0140 Acute pansinusitis, unspecified: Secondary | ICD-10-CM | POA: Diagnosis not present

## 2015-05-07 MED ORDER — CLINDAMYCIN HCL 300 MG PO CAPS: 300.0000 mg | ORAL_CAPSULE | Freq: Three times a day (TID) | ORAL | Status: AC

## 2015-05-07 MED ORDER — FLUTICASONE PROPIONATE 50 MCG/ACT NA SUSP: 1.0000 | Freq: Every day | NASAL | Status: AC

## 2015-05-07 NOTE — Patient Instructions (Signed)
Continue Alavert daily

## 2015-05-07 NOTE — Progress Notes (Signed)
Name: Caroline Russo   MRN: [id number]    DOB: [birth year]   Date:05/07/2015       Progress Note  Subjective  Chief Complaint  Chief Complaint  Patient presents with  . Sinus Problem    Drainage, taking Alavert; pt says it doesn't seem to be helping; drainage has an odor    HPI Here c/o pnd and chokinmg on thick mucus.  Mucus has a bad odor.  No fever.  Cough is throaty  No problem-specific assessment & plan notes found for this encounter.   Past Medical History  Diagnosis Date  . HTN (hypertension)   . DM2 (diabetes mellitus, type 2) (HCC)     insulin requiring  . Hypercholesterolemia   . Murmur     hx  . Tinnitus     chronic  . Macular degeneration   . Chronic chest pain     a. nonobs cath 2002.  Marland Kitchen Palpitations     a. 01/2011 Holter monitor showed frequent PACs, sinus bradycardia  . Anxiety   . Depression   . Irregular cardiac rhythm   . Heart murmur     a. 09/2014 Echo: EF 55-60%, nl wm, mod MS, mild MR, mod dil LA, mild-mod TR.  . Cataract   . Non Hodgkin's lymphoma ([place])   . Hypomagnesemia 06/13/2014  . Non-obstructive CAD   . Orthostatic hypotension     Social History  Substance Use Topics  . Smoking status: Former Smoker -- 0.25 packs/day for 7 years    Types: Cigarettes    Quit date: 01/25/1988  . Smokeless tobacco: Never Used  . Alcohol Use: No     Current outpatient prescriptions:  .  ALPRAZolam (XANAX) 0.25 MG tablet, Take 1 tablet (0.25 mg total) by mouth every 6 (six) hours as needed., Disp: 30 tablet, Rfl: 1 .  cyclobenzaprine (FLEXERIL) 10 MG tablet, Take 10 mg by mouth 2 (two) times daily as needed for muscle spasms., Disp: , Rfl:  .  Diphenhyd-Hydrocort-Nystatin (FIRST-DUKES MOUTHWASH MT), , Disp: , Rfl: 2 .  fluticasone (FLONASE) 50 MCG/ACT nasal spray, Place 1 spray into both nostrils daily., Disp: , Rfl:  .  lidocaine-prilocaine (EMLA) cream, Apply 1 application topically as needed., Disp: 30 g, Rfl: 3 .  losartan (COZAAR) 50 MG tablet,  TAKE 1 TABLET BY MOUTH DAILY AFTER BREAKFAST., Disp: 90 tablet, Rfl: 3 .  meclizine (ANTIVERT) 12.5 MG tablet, Take 12.5 mg by mouth 3 (three) times daily as needed., Disp: , Rfl:  .  mirtazapine (REMERON) 15 MG tablet, Take 1 tablet (15 mg total) by mouth at bedtime., Disp: 15 tablet, Rfl: 0 .  Multiple Vitamin (MULTIVITAMIN WITH MINERALS) TABS tablet, Take 1 tablet by mouth daily., Disp: , Rfl:  .  nitroGLYCERIN (NITROSTAT) 0.4 MG SL tablet, Place 0.4 mg under the tongue every 5 (five) minutes as needed.  , Disp: , Rfl:  .  pantoprazole (PROTONIX) 40 MG tablet, TAKE 1 TABLET (40 MG TOTAL) BY MOUTH DAILY., Disp: 90 tablet, Rfl: 3 .  polyethylene glycol powder (GLYCOLAX/MIRALAX) powder, Take 17 g by mouth daily as needed., Disp: 3350 g, Rfl: 0 .  SLOW-MAG 71.5-119 MG TBEC SR tablet, TAKE 1 TABLET (64 MG TOTAL) BY MOUTH 2 (TWO) TIMES DAILY., Disp: 60 tablet, Rfl: 3 .  VICODIN 5-300 MG TABS, TK 1 OR 2 TS PO Q 4-6 H PRF DISCOMFORT, Disp: , Rfl: 0 No current facility-administered medications for this visit.  Facility-Administered Medications Ordered in Other Visits:  .  heparin lock flush 100 unit/mL, 500 Units, Intravenous, Once, Forest Gleason, MD, 500 Units at 07/24/14 1630 .  sodium chloride 0.9 % injection 10 mL, 10 mL, Intravenous, PRN, Forest Gleason, MD, 10 mL at 07/24/14 1020 .  sodium chloride 0.9 % injection 10 mL, 10 mL, Intracatheter, PRN, Forest Gleason, MD, 10 mL at 07/24/14 1020 .  sodium chloride 0.9 % injection 10 mL, 10 mL, Intravenous, PRN, Forest Gleason, MD, 10 mL at 08/12/14 0834  Allergies  Allergen Reactions  . Moxifloxacin Shortness Of Breath    hallucinations  . Codeine Other (See Comments)    unknown  . Penicillins     Itching  Rash   . Prednisone     Hallucinations  . Unasyn [Ampicillin-Sulbactam Sodium] Other (See Comments)    unknown    Review of Systems  Constitutional: Negative for fever, chills, weight loss and malaise/fatigue.  HENT: Positive for congestion.  Negative for hearing loss and sore throat.   Eyes: Negative for blurred vision and double vision.  Respiratory: Positive for cough. Negative for shortness of breath and wheezing.   Cardiovascular: Negative for chest pain, palpitations and leg swelling.  Gastrointestinal: Negative for heartburn, abdominal pain and blood in stool.  Genitourinary: Negative for dysuria, urgency and frequency.  Skin: Negative for rash.  Neurological: Negative for weakness and headaches.      Objective  Filed Vitals:   05/07/15 1339  BP: 131/70  Pulse: 76  Temp: 98.7 F (37.1 C)  TempSrc: Oral  Resp: 16  Height: 5' 3.5" (1.613 m)  Weight: 121 lb 9.6 oz (55.157 kg)     Physical Exam  Constitutional: She is oriented to person, place, and time. No distress.  HENT:  Head: Normocephalic and atraumatic.  Right Ear: External ear normal.  Left Ear: External ear normal.  Ears:  Nose: Rhinorrhea present. Right sinus exhibits no maxillary sinus tenderness and no frontal sinus tenderness. Left sinus exhibits no maxillary sinus tenderness and no frontal sinus tenderness.  Mouth/Throat: Oropharynx is clear and moist.  Cardiovascular: Normal rate, regular rhythm and normal heart sounds.   Pulmonary/Chest: Effort normal and breath sounds normal. No respiratory distress. She has no wheezes. She has no rales.  Neurological: She is alert and oriented to person, place, and time.      Recent Results (from the past 2160 hour(s))  CBC with Differential/Platelet     Status: Abnormal   Collection Time: 03/09/15 11:32 AM  Result Value Ref Range   WBC 1.9 (L) 3.6 - 11.0 K/uL   RBC 4.54 3.80 - 5.20 MIL/uL   Hemoglobin 13.0 12.0 - 16.0 g/dL   HCT 39.1 35.0 - 47.0 %   MCV 86.2 80.0 - 100.0 fL   MCH 28.6 26.0 - 34.0 pg   MCHC 33.2 32.0 - 36.0 g/dL   RDW 14.0 11.5 - 14.5 %   Platelets 182 150 - 440 K/uL   Neutrophils Relative % 37 %   Lymphocytes Relative 41 %   Monocytes Relative 17 %   Eosinophils Relative 2  %   Basophils Relative 3 %   Neutro Abs 0.7 (L) 1.4 - 6.5 K/uL   Lymphs Abs 0.8 (L) 1.0 - 3.6 K/uL   Monocytes Absolute 0.3 0.2 - 0.9 K/uL   Eosinophils Absolute 0.0 0 - 0.7 K/uL   Basophils Absolute 0.1 0 - 0.1 K/uL   Smear Review MORPHOLOGY UNREMARKABLE   Magnesium     Status: Abnormal   Collection Time: 03/09/15 11:32 AM  Result  Value Ref Range   Magnesium 1.6 (L) 1.7 - 2.4 mg/dL  Comprehensive metabolic panel     Status: Abnormal   Collection Time: 03/09/15 11:32 AM  Result Value Ref Range   Sodium 137 135 - 145 mmol/L   Potassium 4.2 3.5 - 5.1 mmol/L   Chloride 105 101 - 111 mmol/L   CO2 25 22 - 32 mmol/L   Glucose, Bld 155 (H) 65 - 99 mg/dL   BUN 13 6 - 20 mg/dL   Creatinine, Ser 1.00 0.44 - 1.00 mg/dL   Calcium 9.0 8.9 - 10.3 mg/dL   Total Protein 6.5 6.5 - 8.1 g/dL   Albumin 3.6 3.5 - 5.0 g/dL   AST 26 15 - 41 U/L   ALT 11 (L) 14 - 54 U/L   Alkaline Phosphatase 83 38 - 126 U/L   Total Bilirubin 0.7 0.3 - 1.2 mg/dL   GFR calc non Af Amer 47 (L) >60 mL/min   GFR calc Af Amer 55 (L) >60 mL/min    Comment: (NOTE) The eGFR has been calculated using the CKD EPI equation. This calculation has not been validated in all clinical situations. eGFR's persistently <60 mL/min signify possible Chronic Kidney Disease.    Anion gap 7 5 - 15  CULTURE, URINE COMPREHENSIVE     Status: None   Collection Time: 03/17/15 12:00 AM  Result Value Ref Range   Urine Culture, Comprehensive Final report    Result 1 Comment     Comment: Mixed urogenital flora 10,000-25,000 colony forming units per mL   POCT urinalysis dipstick     Status: Normal   Collection Time: 03/17/15  1:39 PM  Result Value Ref Range   Color, UA straw    Clarity, UA clear.    Glucose, UA neg    Bilirubin, UA neg    Ketones, UA 5    Spec Grav, UA 1.025    Blood, UA neg    pH, UA 5.0    Protein, UA neg    Urobilinogen, UA 0.2    Nitrite, UA neg    Leukocytes, UA Negative Negative  CBC with  Differential/Platelet     Status: Abnormal   Collection Time: 03/31/15  9:34 AM  Result Value Ref Range   WBC 1.9 (L) 3.6 - 11.0 K/uL   RBC 3.96 3.80 - 5.20 MIL/uL   Hemoglobin 11.6 (L) 12.0 - 16.0 g/dL   HCT 34.2 (L) 35.0 - 47.0 %   MCV 86.5 80.0 - 100.0 fL   MCH 29.3 26.0 - 34.0 pg   MCHC 33.9 32.0 - 36.0 g/dL   RDW 14.0 11.5 - 14.5 %   Platelets 115 (L) 150 - 440 K/uL   Neutrophils Relative % 30% %   Neutro Abs 0.6 (L) 1.4 - 6.5 K/uL   Lymphocytes Relative 44% %   Lymphs Abs 0.8 (L) 1.0 - 3.6 K/uL   Monocytes Relative 20% %   Monocytes Absolute 0.4 0.2 - 0.9 K/uL   Eosinophils Relative 4% %   Eosinophils Absolute 0.1 0 - 0.7 K/uL   Basophils Relative 2% %   Basophils Absolute 0.0 0 - 0.1 K/uL  Magnesium     Status: Abnormal   Collection Time: 03/31/15  9:34 AM  Result Value Ref Range   Magnesium 1.4 (L) 1.7 - 2.4 mg/dL  Comprehensive metabolic panel     Status: Abnormal   Collection Time: 03/31/15  9:34 AM  Result Value Ref Range   Sodium 133 (L) 135 -  145 mmol/L   Potassium 3.6 3.5 - 5.1 mmol/L   Chloride 103 101 - 111 mmol/L   CO2 25 22 - 32 mmol/L   Glucose, Bld 157 (H) 65 - 99 mg/dL   BUN 14 6 - 20 mg/dL   Creatinine, Ser 0.73 0.44 - 1.00 mg/dL   Calcium 8.3 (L) 8.9 - 10.3 mg/dL   Total Protein 5.8 (L) 6.5 - 8.1 g/dL   Albumin 3.3 (L) 3.5 - 5.0 g/dL   AST 18 15 - 41 U/L   ALT 9 (L) 14 - 54 U/L   Alkaline Phosphatase 86 38 - 126 U/L   Total Bilirubin 0.5 0.3 - 1.2 mg/dL   GFR calc non Af Amer >60 >60 mL/min   GFR calc Af Amer >60 >60 mL/min    Comment: (NOTE) The eGFR has been calculated using the CKD EPI equation. This calculation has not been validated in all clinical situations. eGFR's persistently <60 mL/min signify possible Chronic Kidney Disease.    Anion gap 5 5 - 15  Vitamin B12     Status: None   Collection Time: 03/31/15 10:30 AM  Result Value Ref Range   Vitamin B-12 875 180 - 914 pg/mL    Comment: (NOTE) This assay is not validated for  testing neonatal or myeloproliferative syndrome specimens for Vitamin B12 levels. Performed at [facility]   Folate     Status: None   Collection Time: 03/31/15 10:30 AM  Result Value Ref Range   Folate 9.3 >5.9 ng/mL     Assessment & Plan  1. Acute pansinusitis, recurrence not specified  - fluticasone (FLONASE) 50 MCG/ACT nasal spray; Place 1 spray into both nostrils daily.  Dispense: 16 g; Refill: 6 - clindamycin (CLEOCIN) 300 MG capsule; Take 1 capsule (300 mg total) by mouth 3 (three) times daily.  Dispense: 30 capsule; Refill: 0

## 2015-05-19 ENCOUNTER — Telehealth: Payer: Self-pay | Admitting: *Deleted

## 2015-05-19 ENCOUNTER — Other Ambulatory Visit: Payer: Self-pay | Admitting: *Deleted

## 2015-05-19 ENCOUNTER — Inpatient Hospital Stay: Payer: Medicare Other | Attending: Oncology

## 2015-05-19 DIAGNOSIS — Z79899 Other long term (current) drug therapy: Secondary | ICD-10-CM | POA: Insufficient documentation

## 2015-05-19 DIAGNOSIS — C8230 Follicular lymphoma grade IIIa, unspecified site: Secondary | ICD-10-CM

## 2015-05-19 LAB — CBC WITH DIFFERENTIAL/PLATELET
Basophils Absolute: 0 10*3/uL (ref 0–0.1)
Basophils Relative: 3 %
EOS ABS: 0 10*3/uL (ref 0–0.7)
HCT: 39.4 % (ref 35.0–47.0)
Hemoglobin: 13.4 g/dL (ref 12.0–16.0)
LYMPHS ABS: 0.5 10*3/uL — AB (ref 1.0–3.6)
Lymphocytes Relative: 43 %
MCH: 28.5 pg (ref 26.0–34.0)
MCHC: 33.9 g/dL (ref 32.0–36.0)
MCV: 84.1 fL (ref 80.0–100.0)
MONO ABS: 0.1 10*3/uL — AB (ref 0.2–0.9)
Neutro Abs: 0.4 10*3/uL — ABNORMAL LOW (ref 1.4–6.5)
PLATELETS: 117 10*3/uL — AB (ref 150–440)
RBC: 4.69 MIL/uL (ref 3.80–5.20)
RDW: 15 % — ABNORMAL HIGH (ref 11.5–14.5)
WBC: 1.1 10*3/uL — CL (ref 3.6–11.0)

## 2015-05-19 LAB — COMPREHENSIVE METABOLIC PANEL
ALT: 12 U/L — AB (ref 14–54)
AST: 28 U/L (ref 15–41)
Albumin: 3.5 g/dL (ref 3.5–5.0)
Alkaline Phosphatase: 93 U/L (ref 38–126)
Anion gap: 13 (ref 5–15)
BUN: 15 mg/dL (ref 6–20)
CALCIUM: 9 mg/dL (ref 8.9–10.3)
CHLORIDE: 100 mmol/L — AB (ref 101–111)
CO2: 21 mmol/L — ABNORMAL LOW (ref 22–32)
CREATININE: 0.89 mg/dL (ref 0.44–1.00)
GFR, EST NON AFRICAN AMERICAN: 54 mL/min — AB (ref >60)
Glucose, Bld: 162 mg/dL — ABNORMAL HIGH (ref 65–99)
Potassium: 4.2 mmol/L (ref 3.5–5.1)
Sodium: 134 mmol/L — ABNORMAL LOW (ref 135–145)
Total Bilirubin: 0.4 mg/dL (ref 0.3–1.2)
Total Protein: 6.3 g/dL — ABNORMAL LOW (ref 6.5–8.1)

## 2015-05-19 LAB — MAGNESIUM: MAGNESIUM: 1.4 mg/dL — AB (ref 1.7–2.4)

## 2015-05-19 NOTE — Telephone Encounter (Signed)
Called pt's daughter, [name], to review labs from today. Pt scheduled for Mag infusion on 4/26 at 2:15pm. Also lab/md appt scheduled on 5/3 at 2:15pm. Instructed Cheryl to call back to further discuss labs and neutropenic precautions.

## 2015-05-19 NOTE — Telephone Encounter (Signed)
Opened in error

## 2015-05-19 NOTE — Telephone Encounter (Signed)
Per Dr Oliva Bustard, come in for labs and wait for results. [name] agrees to have pt here at 115 today

## 2015-05-19 NOTE — Telephone Encounter (Signed)
Called to request that she come in for lab check. She is weak and not eating. She was recently on Clindamycin for a sinus infection per Dr Luan Pulling. THis made her see things after 4 days of taking it and it continued for 3 days after stopping it. Asking if we can help her

## 2015-05-20 ENCOUNTER — Other Ambulatory Visit: Payer: Self-pay | Admitting: Family Medicine

## 2015-05-20 ENCOUNTER — Inpatient Hospital Stay: Payer: Medicare Other

## 2015-05-20 DIAGNOSIS — M5489 Other dorsalgia: Secondary | ICD-10-CM

## 2015-05-20 DIAGNOSIS — Z79899 Other long term (current) drug therapy: Secondary | ICD-10-CM | POA: Diagnosis not present

## 2015-05-20 DIAGNOSIS — C8599 Non-Hodgkin lymphoma, unspecified, extranodal and solid organ sites: Secondary | ICD-10-CM

## 2015-05-20 DIAGNOSIS — C8590 Non-Hodgkin lymphoma, unspecified, unspecified site: Secondary | ICD-10-CM

## 2015-05-20 LAB — URINALYSIS COMPLETE WITH MICROSCOPIC (ARMC ONLY)
BILIRUBIN URINE: NEGATIVE
Bacteria, UA: NONE SEEN
Glucose, UA: NEGATIVE mg/dL
Hgb urine dipstick: NEGATIVE
KETONES UR: NEGATIVE mg/dL
Leukocytes, UA: NEGATIVE
NITRITE: NEGATIVE
PH: 6 (ref 5.0–8.0)
PROTEIN: NEGATIVE mg/dL
SPECIFIC GRAVITY, URINE: 1.014 (ref 1.005–1.030)

## 2015-05-20 MED ORDER — MAGNESIUM SULFATE 2 GM/50ML IV SOLN
2.0000 g | Freq: Once | INTRAVENOUS | Status: AC
  Administered 2015-05-20: 2 g via INTRAVENOUS
  Filled 2015-05-20: qty 50

## 2015-05-20 MED ORDER — HEPARIN SOD (PORK) LOCK FLUSH 100 UNIT/ML IV SOLN
500.0000 [IU] | Freq: Once | INTRAVENOUS | Status: AC
  Administered 2015-05-20: 500 [IU] via INTRAVENOUS
  Filled 2015-05-20: qty 5

## 2015-05-20 MED ORDER — SODIUM CHLORIDE 0.9% FLUSH
10.0000 mL | INTRAVENOUS | Status: DC | PRN
Start: 2015-05-20 — End: 2015-05-20
  Filled 2015-05-20: qty 10

## 2015-05-20 MED ORDER — CEPHALEXIN 500 MG PO CAPS: 500.0000 mg | ORAL_CAPSULE | Freq: Two times a day (BID) | ORAL | Status: DC

## 2015-05-20 MED ORDER — SODIUM CHLORIDE 0.9 % IV SOLN
INTRAVENOUS | Status: DC
  Administered 2015-05-20: 14:00:00 via INTRAVENOUS
  Filled 2015-05-20: qty 1000

## 2015-05-21 ENCOUNTER — Telehealth: Payer: Self-pay | Admitting: Family Medicine

## 2015-05-21 ENCOUNTER — Other Ambulatory Visit: Payer: Self-pay | Admitting: Family Medicine

## 2015-05-21 NOTE — Telephone Encounter (Signed)
Informed of UA results. Advised to stop antibiotics.

## 2015-05-22 DIAGNOSIS — J910 Malignant pleural effusion: Secondary | ICD-10-CM | POA: Diagnosis not present

## 2015-05-22 LAB — URINE CULTURE

## 2015-05-25 ENCOUNTER — Encounter
Admission: RE | Admit: 2015-05-25 | Discharge: 2015-05-25 | Disposition: A | Discharge status: Home / Self Care | Payer: Medicare Other | Source: Ambulatory Visit | Attending: Oncology | Admitting: Oncology

## 2015-05-25 DIAGNOSIS — D709 Neutropenia, unspecified: Secondary | ICD-10-CM

## 2015-05-25 DIAGNOSIS — C8230 Follicular lymphoma grade IIIa, unspecified site: Secondary | ICD-10-CM | POA: Insufficient documentation

## 2015-05-26 ENCOUNTER — Encounter
Admission: RE | Admit: 2015-05-26 | Discharge: 2015-05-26 | Disposition: A | Discharge status: Home / Self Care | Payer: Medicare Other | Source: Ambulatory Visit | Attending: Oncology | Admitting: Oncology

## 2015-05-26 DIAGNOSIS — D709 Neutropenia, unspecified: Secondary | ICD-10-CM | POA: Insufficient documentation

## 2015-05-26 DIAGNOSIS — C8230 Follicular lymphoma grade IIIa, unspecified site: Secondary | ICD-10-CM | POA: Diagnosis not present

## 2015-05-26 DIAGNOSIS — C8590 Non-Hodgkin lymphoma, unspecified, unspecified site: Secondary | ICD-10-CM | POA: Diagnosis not present

## 2015-05-26 LAB — GLUCOSE, CAPILLARY: GLUCOSE-CAPILLARY: 108 mg/dL — AB (ref 65–99)

## 2015-05-26 MED ORDER — FLUDEOXYGLUCOSE F - 18 (FDG) INJECTION
12.8100 | Freq: Once | INTRAVENOUS | Status: AC | PRN
  Administered 2015-05-26: 12.81 via INTRAVENOUS

## 2015-05-27 ENCOUNTER — Inpatient Hospital Stay (HOSPITAL_BASED_OUTPATIENT_CLINIC_OR_DEPARTMENT_OTHER): Payer: Medicare Other | Admitting: Oncology

## 2015-05-27 ENCOUNTER — Inpatient Hospital Stay: Payer: Medicare Other | Attending: Oncology

## 2015-05-27 ENCOUNTER — Encounter: Payer: Self-pay | Admitting: Oncology

## 2015-05-27 VITALS — BP 118/64 | HR 40 | Temp 96.7°F | Resp 18 | Wt 120.5 lb

## 2015-05-27 DIAGNOSIS — R0789 Other chest pain: Secondary | ICD-10-CM | POA: Diagnosis not present

## 2015-05-27 DIAGNOSIS — I10 Essential (primary) hypertension: Secondary | ICD-10-CM | POA: Diagnosis not present

## 2015-05-27 DIAGNOSIS — R0602 Shortness of breath: Secondary | ICD-10-CM | POA: Insufficient documentation

## 2015-05-27 DIAGNOSIS — Z9181 History of falling: Secondary | ICD-10-CM | POA: Insufficient documentation

## 2015-05-27 DIAGNOSIS — Z800 Family history of malignant neoplasm of digestive organs: Secondary | ICD-10-CM | POA: Insufficient documentation

## 2015-05-27 DIAGNOSIS — C8230 Follicular lymphoma grade IIIa, unspecified site: Secondary | ICD-10-CM

## 2015-05-27 DIAGNOSIS — D709 Neutropenia, unspecified: Secondary | ICD-10-CM

## 2015-05-27 DIAGNOSIS — Z87891 Personal history of nicotine dependence: Secondary | ICD-10-CM | POA: Insufficient documentation

## 2015-05-27 DIAGNOSIS — Z8052 Family history of malignant neoplasm of bladder: Secondary | ICD-10-CM

## 2015-05-27 DIAGNOSIS — F418 Other specified anxiety disorders: Secondary | ICD-10-CM | POA: Diagnosis not present

## 2015-05-27 DIAGNOSIS — N390 Urinary tract infection, site not specified: Secondary | ICD-10-CM

## 2015-05-27 DIAGNOSIS — Z79899 Other long term (current) drug therapy: Secondary | ICD-10-CM | POA: Diagnosis not present

## 2015-05-27 DIAGNOSIS — C8290 Follicular lymphoma, unspecified, unspecified site: Secondary | ICD-10-CM | POA: Insufficient documentation

## 2015-05-27 DIAGNOSIS — M549 Dorsalgia, unspecified: Secondary | ICD-10-CM | POA: Diagnosis not present

## 2015-05-27 DIAGNOSIS — Z801 Family history of malignant neoplasm of trachea, bronchus and lung: Secondary | ICD-10-CM | POA: Insufficient documentation

## 2015-05-27 DIAGNOSIS — Z9225 Personal history of immunosupression therapy: Secondary | ICD-10-CM | POA: Diagnosis not present

## 2015-05-27 DIAGNOSIS — E119 Type 2 diabetes mellitus without complications: Secondary | ICD-10-CM

## 2015-05-27 DIAGNOSIS — J90 Pleural effusion, not elsewhere classified: Secondary | ICD-10-CM | POA: Insufficient documentation

## 2015-05-27 DIAGNOSIS — E7800 Pure hypercholesterolemia, unspecified: Secondary | ICD-10-CM | POA: Insufficient documentation

## 2015-05-27 DIAGNOSIS — C8213 Follicular lymphoma grade II, intra-abdominal lymph nodes: Secondary | ICD-10-CM

## 2015-05-27 DIAGNOSIS — K219 Gastro-esophageal reflux disease without esophagitis: Secondary | ICD-10-CM | POA: Insufficient documentation

## 2015-05-27 LAB — COMPREHENSIVE METABOLIC PANEL
ALK PHOS: 87 U/L (ref 38–126)
ALT: 12 U/L — AB (ref 14–54)
ANION GAP: 9 (ref 5–15)
AST: 22 U/L (ref 15–41)
Albumin: 3.5 g/dL (ref 3.5–5.0)
BILIRUBIN TOTAL: 0.5 mg/dL (ref 0.3–1.2)
BUN: 14 mg/dL (ref 6–20)
CALCIUM: 8.7 mg/dL — AB (ref 8.9–10.3)
CO2: 24 mmol/L (ref 22–32)
CREATININE: 0.79 mg/dL (ref 0.44–1.00)
Chloride: 104 mmol/L (ref 101–111)
Glucose, Bld: 134 mg/dL — ABNORMAL HIGH (ref 65–99)
Potassium: 3.8 mmol/L (ref 3.5–5.1)
SODIUM: 137 mmol/L (ref 135–145)
TOTAL PROTEIN: 6.2 g/dL — AB (ref 6.5–8.1)

## 2015-05-27 LAB — CBC WITH DIFFERENTIAL/PLATELET
Basophils Absolute: 0 10*3/uL (ref 0–0.1)
EOS ABS: 0.2 10*3/uL (ref 0–0.7)
Eosinophils Relative: 4 %
HCT: 35 % (ref 35.0–47.0)
HEMOGLOBIN: 11.9 g/dL — AB (ref 12.0–16.0)
LYMPHS ABS: 2.3 10*3/uL (ref 1.0–3.6)
Lymphocytes Relative: 56 %
MCH: 28.4 pg (ref 26.0–34.0)
MCHC: 33.9 g/dL (ref 32.0–36.0)
MCV: 83.7 fL (ref 80.0–100.0)
MONO ABS: 0.7 10*3/uL (ref 0.2–0.9)
NEUTROS ABS: 0.9 10*3/uL — AB (ref 1.4–6.5)
Platelets: 202 10*3/uL (ref 150–440)
RBC: 4.18 MIL/uL (ref 3.80–5.20)
RDW: 14.8 % — ABNORMAL HIGH (ref 11.5–14.5)
WBC: 4 10*3/uL (ref 3.6–11.0)

## 2015-05-27 LAB — MAGNESIUM: MAGNESIUM: 1.6 mg/dL — AB (ref 1.7–2.4)

## 2015-05-27 NOTE — Progress Notes (Signed)
Patient c/o burning in her chest and down her left arm.

## 2015-05-27 NOTE — Progress Notes (Signed)
[place] @ [facility] [phone]) [phone]  Fax:(336) [place]: 1919/10/29  MR#: [id number]  IWO#:[id number]  Patient Care Team: Arlis Porta., MD as PCP - General (Unknown Physician Specialty)  CHIEF COMPLAINT:  Chief Complaint  Patient presents with  . Follicular lymphoma    Oncology History   1. Biopsy of para-aortic lymph node is consistent with follicular lymphoma, clinically stage as 3A Diagnosis in July of 2015. Started on rituximab on July 31, 2013 2. Started on [place]  and Rituxan on November 04, 2013 3 maintenance rituximab started from April of 2016      Lymphoma [facility]   12/12/2013 Initial Diagnosis Lymphoma    Oncology Flowsheet 07/24/2014 09/24/2014 11/19/2014 02/03/2015 03/31/2015  diphenhydrAMINE (BENADRYL) PO 25 mg 25 mg 25 mg 25 mg 25 mg  riTUXimab (RITUXAN) IV 375 mg/m2 375 mg/m2 375 mg/m2 375 mg/m2 375 mg/m2    INTERVAL HISTORY: 82 year old lady with a history of follicular lymphoma recently fell down.  Patient is gradually improving.  Has gained weight. No chills fever appetite has been improving.  Patient had dental treatment done with all teeth removed following that patient can eat better.  No nausea no vomiting no diarrhea.  Patient's condition has been declining.  As increasing back pain. Recent PET scan is been it reviewed independently  and shows interval increase in the size and metabolic activity of single right external iliac lymph node.  Patient had moderate right pleural effusion with drainage catheter in place  REVIEW OF SYSTEMS:   Gen. status.  General condition is somewhat improved.  At home patient is ambulating.  Lungs: Shortness of breath on exertion.  Cardiac: No chest pain.  Skin: No rash.  Multiple ecchymotic or area\neurological system: No headache.  No dizziness.  No other focal symptoms.  No lower extremity edema.  Abdomen: No abdominal pain.  No nausea.  No vomiting.  No diarrhea.  No rectal bleeding.. GU:  No dysuria or hematuria Lower extremity no swelling.  After all teeth removed and getting new denture patient is eating somewhat better Musculoskeletal system intrascapular pain Neurological system: No occasional dizziness.  No other focal signs patienr Intrascapular pain associated with breathing and coughing Patient has a Pleurx catheter and had 200cc of pleural fluid removed As per HPI. Otherwise, a complete review of systems is negatve.  PAST MEDICAL HISTORY: Past Medical History  Diagnosis Date  . HTN (hypertension)   . DM2 (diabetes mellitus, type 2) (HCC)     insulin requiring  . Hypercholesterolemia   . Murmur     hx  . Tinnitus     chronic  . Macular degeneration   . Chronic chest pain     a. nonobs cath 2002.  Marland Kitchen Palpitations     a. 01/2011 Holter monitor showed frequent PACs, sinus bradycardia  . Anxiety   . Depression   . Irregular cardiac rhythm   . Heart murmur     a. 09/2014 Echo: EF 55-60%, nl wm, mod MS, mild MR, mod dil LA, mild-mod TR.  . Cataract   . Non Hodgkin's lymphoma ([place])   . Hypomagnesemia 06/13/2014  . Non-obstructive CAD   . Orthostatic hypotension     PAST SURGICAL HISTORY: Past Surgical History  Procedure Laterality Date  . Cardiac catheterization  2002  . Tumor removal  2012    benign neck tumor removed  . Tooth extraction    . Knee surgery Left   . Ankle surgery Left   .  Partial hysterectomy    . Insertion / placement pleural catheter      FAMILY HISTORY Family History  Problem Relation Age of Onset  . Heart disease Mother   . Lung cancer Brother     smoked  . Bladder Cancer Brother   . Stomach cancer Brother       ADVANCED DIRECTIVES: Patient does have living will   HEALTH MAINTENANCE: Social History  Substance Use Topics  . Smoking status: Former Smoker -- 0.25 packs/day for 7 years    Types: Cigarettes    Quit date: 01/25/1988  . Smokeless tobacco: Never Used  . Alcohol Use: No       Allergies  Allergen  Reactions  . Moxifloxacin Shortness Of Breath    hallucinations  . Codeine Other (See Comments)    unknown  . Penicillins     Itching  Rash   . Prednisone     Hallucinations  . Unasyn [Ampicillin-Sulbactam Sodium] Other (See Comments)    unknown    Current Outpatient Prescriptions  Medication Sig Dispense Refill  . ALPRAZolam (XANAX) 0.25 MG tablet Take 1 tablet (0.25 mg total) by mouth every 6 (six) hours as needed. 30 tablet 1  . cyclobenzaprine (FLEXERIL) 10 MG tablet Take 10 mg by mouth 2 (two) times daily as needed for muscle spasms.    . Diphenhyd-Hydrocort-Nystatin (FIRST-DUKES MOUTHWASH MT)   2  . fluticasone (FLONASE) 50 MCG/ACT nasal spray Place 1 spray into both nostrils daily. 16 g 6  . lidocaine-prilocaine (EMLA) cream Apply 1 application topically as needed. 30 g 3  . losartan (COZAAR) 50 MG tablet TAKE 1 TABLET BY MOUTH DAILY AFTER BREAKFAST. 90 tablet 3  . meclizine (ANTIVERT) 12.5 MG tablet Take 12.5 mg by mouth 3 (three) times daily as needed.    . mirtazapine (REMERON) 15 MG tablet Take 1 tablet (15 mg total) by mouth at bedtime. 15 tablet 0  . Multiple Vitamin (MULTIVITAMIN WITH MINERALS) TABS tablet Take 1 tablet by mouth daily.    . nitroGLYCERIN (NITROSTAT) 0.4 MG SL tablet Place 0.4 mg under the tongue every 5 (five) minutes as needed.      . pantoprazole (PROTONIX) 40 MG tablet TAKE 1 TABLET (40 MG TOTAL) BY MOUTH DAILY. 90 tablet 3  . polyethylene glycol powder (GLYCOLAX/MIRALAX) powder Take 17 g by mouth daily as needed. 3350 g 0  . SLOW-MAG 71.5-119 MG TBEC SR tablet TAKE 1 TABLET (64 MG TOTAL) BY MOUTH 2 (TWO) TIMES DAILY. 60 tablet 3  . VICODIN 5-300 MG TABS TK 1 OR 2 TS PO Q 4-6 H PRF DISCOMFORT  0   No current facility-administered medications for this visit.   Facility-Administered Medications Ordered in Other Visits  Medication Dose Route Frequency Provider Last Rate Last Dose  . heparin lock flush 100 unit/mL  500 Units Intravenous Once Forest Gleason, MD   500 Units at 07/24/14 1630  . sodium chloride 0.9 % injection 10 mL  10 mL Intravenous PRN Forest Gleason, MD   10 mL at 07/24/14 1020  . sodium chloride 0.9 % injection 10 mL  10 mL Intracatheter PRN Forest Gleason, MD   10 mL at 07/24/14 1020  . sodium chloride 0.9 % injection 10 mL  10 mL Intravenous PRN Forest Gleason, MD   10 mL at 08/12/14 0834    OBJECTIVE:  Filed Vitals:   05/27/15 1458  BP: 118/64  Pulse: 40  Temp: 96.7 F (35.9 C)  Resp: 18  Body mass index is 21.01 kg/(m^2).    ECOG FS:2 - Symptomatic, <50% confined to bed  PHYSICAL EXAM: Gen. status: Patient is in wheelchair.  Lymphatic system: No palpable supraclavicular cervical axillary inguinal adenopathy.  Lungs: Diminished air entry on both sides.  No rhonchi.  No rales.  Patient does have Pleurx catheter still draining well pleural rub  Cardiac: Normal heart sounds.  Soft systolic murmur. Lymphatic system: Supraclavicular, cervical, axillary, inguinal lymph nodes are not palpable Abdomen: Soft.  No palpable masses no tenderness no ascites liver and spleen not palpable. Lower extremity trace edema Skin: Multiple ecchymosis Neurological system: Higher functions within normal limit.  Cranial nerves are intact.  Motor and sensory systems within normal limit All other systems were examined HEENT: No abnormality HEENT: No evidence of any infection in the gums or teeth.  No soreness in the mouth Cardiac: No case no irregular heart sounds   LAB RESULTS:  Appointment on 05/27/2015  Component Date Value Ref Range Status  . WBC 05/27/2015 4.0  3.6 - 11.0 K/uL Final  . RBC 05/27/2015 4.18  3.80 - 5.20 MIL/uL Final  . Hemoglobin 05/27/2015 11.9* 12.0 - 16.0 g/dL Final  . HCT 05/27/2015 35.0  35.0 - 47.0 % Final  . MCV 05/27/2015 83.7  80.0 - 100.0 fL Final  . MCH 05/27/2015 28.4  26.0 - 34.0 pg Final  . MCHC 05/27/2015 33.9  32.0 - 36.0 g/dL Final  . RDW 05/27/2015 14.8* 11.5 - 14.5 % Final  . Platelets  05/27/2015 202  150 - 440 K/uL Final  . Neutrophils Relative % 05/27/2015 21%   Final  . Neutro Abs 05/27/2015 0.9* 1.4 - 6.5 K/uL Final  . Lymphocytes Relative 05/27/2015 56%   Final  . Lymphs Abs 05/27/2015 2.3  1.0 - 3.6 K/uL Final  . Monocytes Relative 05/27/2015 18%   Final  . Monocytes Absolute 05/27/2015 0.7  0.2 - 0.9 K/uL Final  . Eosinophils Relative 05/27/2015 4%   Final  . Eosinophils Absolute 05/27/2015 0.2  0 - 0.7 K/uL Final  . Basophils Relative 05/27/2015 1%   Final  . Basophils Absolute 05/27/2015 0.0  0 - 0.1 K/uL Final  . Smear Review 05/27/2015 SMEAR SCANNED   Corrected  . Sodium 05/27/2015 137  135 - 145 mmol/L Final  . Potassium 05/27/2015 3.8  3.5 - 5.1 mmol/L Final  . Chloride 05/27/2015 104  101 - 111 mmol/L Final  . CO2 05/27/2015 24  22 - 32 mmol/L Final  . Glucose, Bld 05/27/2015 134* 65 - 99 mg/dL Final  . BUN 05/27/2015 14  6 - 20 mg/dL Final  . Creatinine, Ser 05/27/2015 0.79  0.44 - 1.00 mg/dL Final  . Calcium 05/27/2015 8.7* 8.9 - 10.3 mg/dL Final  . Total Protein 05/27/2015 6.2* 6.5 - 8.1 g/dL Final  . Albumin 05/27/2015 3.5  3.5 - 5.0 g/dL Final  . AST 05/27/2015 22  15 - 41 U/L Final  . ALT 05/27/2015 12* 14 - 54 U/L Final  . Alkaline Phosphatase 05/27/2015 87  38 - 126 U/L Final  . Total Bilirubin 05/27/2015 0.5  0.3 - 1.2 mg/dL Final  . GFR calc non Af Amer 05/27/2015 >60  >60 mL/min Final  . GFR calc Af Amer 05/27/2015 >60  >60 mL/min Final   Comment: (NOTE) The eGFR has been calculated using the CKD EPI equation. This calculation has not been validated in all clinical situations. eGFR's persistently <60 mL/min signify possible Chronic Kidney Disease.   Georgiann Hahn  gap 05/27/2015 9  5 - 15 Final  . Magnesium 05/27/2015 1.6* 1.7 - 2.4 mg/dL Final  Hospital Outpatient Visit on 05/26/2015  Component Date Value Ref Range Status  . Glucose-Capillary 05/26/2015 108* 65 - 99 mg/dL Final       ASSESSMENT: 61.  82 year old lady with follicular  lymphoma  Patient recently had a urinary tract infection for which patient is recovering.  PET scan has been reviewed and shows progressive disease in the right external iliac lymph node on the right side patient's pain has been localized on the left side/  There is more fluid accumulation Patient continues to have hypomagnesemia As CT scan shows continuing reaccumulation of fluid family was advised to go back on once a week Pleurx catheter drainage PET scan has been reviewed independently  And   reviewed with the patient Re: Progressing disease patient's general condition is declining is not a candidate for any further aggressive chemotherapy.  We will hold off any further maintenance rituximab therapy. Possibility of observation or palliative radiation therapy however patient has more pain on the left side in the left hip rather than right where there is a increased uptake in the lymph node  I had a prolonged discussion with patient and her family regarding various option.  Considering patient's old age it would be prudent to wait and see how many of the symptoms are related to lymphoma.  Possibility of intermittent prednisone can be considered. Total duration of visit was 45 minutes.  50% or more time was spent in counseling patient and family regarding prognosis and options of treatment and available resources        No matching staging information was found for the patient.  Forest Gleason, MD   05/27/2015 3:41 PM    .:  .

## 2015-06-02 ENCOUNTER — Inpatient Hospital Stay: Payer: Medicare Other

## 2015-06-02 ENCOUNTER — Encounter: Payer: Self-pay | Admitting: Oncology

## 2015-06-02 ENCOUNTER — Inpatient Hospital Stay: Payer: Medicare Other | Admitting: Oncology

## 2015-06-03 ENCOUNTER — Other Ambulatory Visit: Payer: Self-pay | Admitting: Oncology

## 2015-06-15 ENCOUNTER — Telehealth: Payer: Self-pay | Admitting: *Deleted

## 2015-06-15 NOTE — Telephone Encounter (Signed)
Asking to be seen, having to drain pleurex every 3 days getting approximately 250 cc each time. Also she is complianing of back pain in the area of the tube.

## 2015-06-15 NOTE — Telephone Encounter (Signed)
Per Magda Paganini, 250 cc is not bad, see Doctor Choksi this week I left a message for [name] regarding an appt 5/24 @ 930

## 2015-06-17 ENCOUNTER — Inpatient Hospital Stay (HOSPITAL_BASED_OUTPATIENT_CLINIC_OR_DEPARTMENT_OTHER): Payer: Medicare Other | Admitting: Oncology

## 2015-06-17 ENCOUNTER — Encounter: Payer: Self-pay | Admitting: Oncology

## 2015-06-17 VITALS — BP 165/60 | HR 60 | Temp 96.2°F | Resp 18 | Wt 120.2 lb

## 2015-06-17 DIAGNOSIS — Z9181 History of falling: Secondary | ICD-10-CM

## 2015-06-17 DIAGNOSIS — J90 Pleural effusion, not elsewhere classified: Secondary | ICD-10-CM

## 2015-06-17 DIAGNOSIS — E7800 Pure hypercholesterolemia, unspecified: Secondary | ICD-10-CM

## 2015-06-17 DIAGNOSIS — K219 Gastro-esophageal reflux disease without esophagitis: Secondary | ICD-10-CM

## 2015-06-17 DIAGNOSIS — M549 Dorsalgia, unspecified: Secondary | ICD-10-CM

## 2015-06-17 DIAGNOSIS — Z9225 Personal history of immunosupression therapy: Secondary | ICD-10-CM

## 2015-06-17 DIAGNOSIS — Z801 Family history of malignant neoplasm of trachea, bronchus and lung: Secondary | ICD-10-CM

## 2015-06-17 DIAGNOSIS — Z79899 Other long term (current) drug therapy: Secondary | ICD-10-CM

## 2015-06-17 DIAGNOSIS — R0789 Other chest pain: Secondary | ICD-10-CM | POA: Diagnosis not present

## 2015-06-17 DIAGNOSIS — R0602 Shortness of breath: Secondary | ICD-10-CM

## 2015-06-17 DIAGNOSIS — Z800 Family history of malignant neoplasm of digestive organs: Secondary | ICD-10-CM

## 2015-06-17 DIAGNOSIS — N390 Urinary tract infection, site not specified: Secondary | ICD-10-CM | POA: Diagnosis not present

## 2015-06-17 DIAGNOSIS — C8290 Follicular lymphoma, unspecified, unspecified site: Secondary | ICD-10-CM | POA: Diagnosis not present

## 2015-06-17 DIAGNOSIS — F418 Other specified anxiety disorders: Secondary | ICD-10-CM

## 2015-06-17 DIAGNOSIS — Z8052 Family history of malignant neoplasm of bladder: Secondary | ICD-10-CM

## 2015-06-17 DIAGNOSIS — I10 Essential (primary) hypertension: Secondary | ICD-10-CM

## 2015-06-17 DIAGNOSIS — C8230 Follicular lymphoma grade IIIa, unspecified site: Secondary | ICD-10-CM

## 2015-06-17 DIAGNOSIS — E119 Type 2 diabetes mellitus without complications: Secondary | ICD-10-CM

## 2015-06-17 DIAGNOSIS — Z87891 Personal history of nicotine dependence: Secondary | ICD-10-CM

## 2015-06-17 MED ORDER — TRAMADOL HCL 50 MG PO TABS: ORAL_TABLET | ORAL | Status: AC

## 2015-06-17 MED ORDER — PANTOPRAZOLE SODIUM 40 MG PO TBEC: 40.0000 mg | DELAYED_RELEASE_TABLET | Freq: Every day | ORAL | Status: DC

## 2015-06-17 NOTE — Progress Notes (Signed)
Patient is having a lot of reflux.  Takes prilosec but it does not seem to be working well for her anymore.

## 2015-06-22 ENCOUNTER — Encounter: Payer: Self-pay | Admitting: Oncology

## 2015-06-22 NOTE — Progress Notes (Signed)
[place] @ [facility] Telephone:(336) [phone]  Fax:(336) [place]: [birth year]  MR#: [id number]  OYD#:[id number]  Patient Care Team: Arlis Porta., MD as PCP - General (Unknown Physician Specialty) Minna Merritts, MD as Consulting Physician (Cardiology)  CHIEF COMPLAINT:  Chief Complaint  Patient presents with  . Follicular lymphoma    Oncology History   1. Biopsy of para-aortic lymph node is consistent with follicular lymphoma, clinically stage as 3A Diagnosis in July of 2015. Started on rituximab on July 31, 2013 2. Started on [place]  and Rituxan on November 04, 2013 3 maintenance rituximab started from April of 2016      Lymphoma [facility])   12/12/2013 Initial Diagnosis Lymphoma      INTERVAL HISTORY: 82 year old lady with a history of follicular lymphoma recently fell down.  Patient is gradually improving.  Has gained weight. No chills fever appetite has been improving.  Patient had dental treatment done with all teeth removed following that patient can eat better.  No nausea no vomiting no diarrhea.  Patient's condition has been declining.  As increasing back pain. Recent PET scan is been it reviewed independently  and shows interval increase in the size and metabolic activity of single right external iliac lymph node.  Patient had moderate right pleural effusion with drainage catheter in place  Patient has significant chest wall pain continues DULL aching.  Pleural fluid being removed frequently. Lining performance status  REVIEW OF SYSTEMS:   Gen. status.  General condition is somewhat improved.  At home patient is ambulating.  Lungs: Shortness of breath on exertion.  Cardiac: No chest pain.  Skin: No rash.  Multiple ecchymotic or area\neurological system: No headache.  No dizziness.  No other focal symptoms.  No lower extremity edema.  Abdomen: No abdominal pain.  No nausea.  No vomiting.  No diarrhea.  No rectal bleeding.. GU: No dysuria or  hematuria Lower extremity no swelling.  After all teeth removed and getting new denture patient is eating somewhat better Musculoskeletal system intrascapular pain Neurological system: No occasional dizziness.  No other focal signs patienr Intrascapular pain associated with breathing and coughing Patient has a Pleurx catheter and had 200cc of pleural fluid removed As per HPI. Otherwise, a complete review of systems is negatve.  PAST MEDICAL HISTORY: Past Medical History  Diagnosis Date  . HTN (hypertension)   . DM2 (diabetes mellitus, type 2) (HCC)     insulin requiring  . Hypercholesterolemia   . Murmur     hx  . Tinnitus     chronic  . Macular degeneration   . Chronic chest pain     a. nonobs cath 2002.  Marland Kitchen Palpitations     a. 01/2011 Holter monitor showed frequent PACs, sinus bradycardia  . Anxiety   . Depression   . Irregular cardiac rhythm   . Heart murmur     a. 09/2014 Echo: EF 55-60%, nl wm, mod MS, mild MR, mod dil LA, mild-mod TR.  . Cataract   . Non Hodgkin's lymphoma ([place])   . Hypomagnesemia 06/13/2014  . Non-obstructive CAD   . Orthostatic hypotension     PAST SURGICAL HISTORY: Past Surgical History  Procedure Laterality Date  . Cardiac catheterization  2002  . Tumor removal  2012    benign neck tumor removed  . Tooth extraction    . Knee surgery Left   . Ankle surgery Left   . Partial hysterectomy    .  Insertion / placement pleural catheter      FAMILY HISTORY Family History  Problem Relation Age of Onset  . Heart disease Mother   . Lung cancer Brother     smoked  . Bladder Cancer Brother   . Stomach cancer Brother       ADVANCED DIRECTIVES: Patient does have living will   HEALTH MAINTENANCE: Social History  Substance Use Topics  . Smoking status: Former Smoker -- 0.25 packs/day for 7 years    Types: Cigarettes    Quit date: 01/25/1988  . Smokeless tobacco: Never Used  . Alcohol Use: No       Allergies  Allergen Reactions  .  Moxifloxacin Shortness Of Breath    hallucinations  . Codeine Other (See Comments)    unknown  . Penicillins     Itching  Rash   . Prednisone     Hallucinations  . Unasyn [Ampicillin-Sulbactam Sodium] Other (See Comments)    unknown    Current Outpatient Prescriptions  Medication Sig Dispense Refill  . ALPRAZolam (XANAX) 0.25 MG tablet Take 1 tablet (0.25 mg total) by mouth every 6 (six) hours as needed. 30 tablet 1  . cyclobenzaprine (FLEXERIL) 10 MG tablet Take 10 mg by mouth 2 (two) times daily as needed for muscle spasms.    . Diphenhyd-Hydrocort-Nystatin (FIRST-DUKES MOUTHWASH MT)   2  . DOK 100 MG capsule TAKE 1 CAPSULE(100 MG) BY MOUTH TWICE DAILY 60 capsule 0  . fluticasone (FLONASE) 50 MCG/ACT nasal spray Place 1 spray into both nostrils daily. 16 g 6  . lidocaine-prilocaine (EMLA) cream Apply 1 application topically as needed. 30 g 3  . losartan (COZAAR) 50 MG tablet TAKE 1 TABLET BY MOUTH DAILY AFTER BREAKFAST. 90 tablet 3  . meclizine (ANTIVERT) 12.5 MG tablet Take 12.5 mg by mouth 3 (three) times daily as needed.    . mirtazapine (REMERON) 15 MG tablet Take 1 tablet (15 mg total) by mouth at bedtime. 15 tablet 0  . Multiple Vitamin (MULTIVITAMIN WITH MINERALS) TABS tablet Take 1 tablet by mouth daily.    . nitroGLYCERIN (NITROSTAT) 0.4 MG SL tablet Place 0.4 mg under the tongue every 5 (five) minutes as needed.      . pantoprazole (PROTONIX) 40 MG tablet Take 1 tablet (40 mg total) by mouth daily. 30 tablet 6  . polyethylene glycol powder (GLYCOLAX/MIRALAX) powder Take 17 g by mouth daily as needed. 3350 g 0  . SLOW-MAG 71.5-119 MG TBEC SR tablet TAKE 1 TABLET (64 MG TOTAL) BY MOUTH 2 (TWO) TIMES DAILY. 60 tablet 3  . traMADol (ULTRAM) 50 MG tablet Take 1 tablet by mouth every 4-6 hours as needed for pain 90 tablet 0   No current facility-administered medications for this visit.   Facility-Administered Medications Ordered in Other Visits  Medication Dose Route  Frequency Provider Last Rate Last Dose  . heparin lock flush 100 unit/mL  500 Units Intravenous Once Forest Gleason, MD   500 Units at 07/24/14 1630  . sodium chloride 0.9 % injection 10 mL  10 mL Intravenous PRN Forest Gleason, MD   10 mL at 07/24/14 1020  . sodium chloride 0.9 % injection 10 mL  10 mL Intracatheter PRN Forest Gleason, MD   10 mL at 07/24/14 1020  . sodium chloride 0.9 % injection 10 mL  10 mL Intravenous PRN Forest Gleason, MD   10 mL at 08/12/14 0834    OBJECTIVE:  Filed Vitals:   06/17/15 0949  BP: 165/60  Pulse: 60  Temp: 96.2 F (35.7 C)  Resp: 18     Body mass index is 20.95 kg/(m^2).    ECOG FS:2 - Symptomatic, <50% confined to bed  PHYSICAL EXAM: Gen. status: Patient is in wheelchair.  Lymphatic system: No palpable supraclavicular cervical axillary inguinal adenopathy.  Lungs: Diminished air entry on both sides.  No rhonchi.  No rales.  Patient does have Pleurx catheter still draining well pleural rub  Cardiac: Normal heart sounds.  Soft systolic murmur. Lymphatic system: Supraclavicular, cervical, axillary, inguinal lymph nodes are not palpable Abdomen: Soft.  No palpable masses no tenderness no ascites liver and spleen not palpable. Lower extremity trace edema Skin: Multiple ecchymosis Neurological system: Higher functions within normal limit.  Cranial nerves are intact.  Motor and sensory systems within normal limit All other systems were examined HEENT: No abnormality HEENT: No evidence of any infection in the gums or teeth.  No soreness in the mouth Cardiac: No case no irregular heart sounds   LAB RESULTS:  No visits with results within 3 Day(s) from this visit. Latest known visit with results is:  Appointment on 05/27/2015  Component Date Value Ref Range Status  . WBC 05/27/2015 4.0  3.6 - 11.0 K/uL Final  . RBC 05/27/2015 4.18  3.80 - 5.20 MIL/uL Final  . Hemoglobin 05/27/2015 11.9* 12.0 - 16.0 g/dL Final  . HCT 05/27/2015 35.0  35.0 - 47.0 % Final  .  MCV 05/27/2015 83.7  80.0 - 100.0 fL Final  . MCH 05/27/2015 28.4  26.0 - 34.0 pg Final  . MCHC 05/27/2015 33.9  32.0 - 36.0 g/dL Final  . RDW 05/27/2015 14.8* 11.5 - 14.5 % Final  . Platelets 05/27/2015 202  150 - 440 K/uL Final  . Neutrophils Relative % 05/27/2015 21%   Final  . Neutro Abs 05/27/2015 0.9* 1.4 - 6.5 K/uL Final  . Lymphocytes Relative 05/27/2015 56%   Final  . Lymphs Abs 05/27/2015 2.3  1.0 - 3.6 K/uL Final  . Monocytes Relative 05/27/2015 18%   Final  . Monocytes Absolute 05/27/2015 0.7  0.2 - 0.9 K/uL Final  . Eosinophils Relative 05/27/2015 4%   Final  . Eosinophils Absolute 05/27/2015 0.2  0 - 0.7 K/uL Final  . Basophils Relative 05/27/2015 1%   Final  . Basophils Absolute 05/27/2015 0.0  0 - 0.1 K/uL Final  . Smear Review 05/27/2015 SMEAR SCANNED   Corrected  . Sodium 05/27/2015 137  135 - 145 mmol/L Final  . Potassium 05/27/2015 3.8  3.5 - 5.1 mmol/L Final  . Chloride 05/27/2015 104  101 - 111 mmol/L Final  . CO2 05/27/2015 24  22 - 32 mmol/L Final  . Glucose, Bld 05/27/2015 134* 65 - 99 mg/dL Final  . BUN 05/27/2015 14  6 - 20 mg/dL Final  . Creatinine, Ser 05/27/2015 0.79  0.44 - 1.00 mg/dL Final  . Calcium 05/27/2015 8.7* 8.9 - 10.3 mg/dL Final  . Total Protein 05/27/2015 6.2* 6.5 - 8.1 g/dL Final  . Albumin 05/27/2015 3.5  3.5 - 5.0 g/dL Final  . AST 05/27/2015 22  15 - 41 U/L Final  . ALT 05/27/2015 12* 14 - 54 U/L Final  . Alkaline Phosphatase 05/27/2015 87  38 - 126 U/L Final  . Total Bilirubin 05/27/2015 0.5  0.3 - 1.2 mg/dL Final  . GFR calc non Af Amer 05/27/2015 >60  >60 mL/min Final  . GFR calc Af Amer 05/27/2015 >60  >60 mL/min Final   Comment: (NOTE) The  eGFR has been calculated using the CKD EPI equation. This calculation has not been validated in all clinical situations. eGFR's persistently <60 mL/min signify possible Chronic Kidney Disease.   . Anion gap 05/27/2015 9  5 - 15 Final  . Magnesium 05/27/2015 1.6* 1.7 - 2.4 mg/dL Final        ASSESSMENT: 90.  82 year old lady with follicular lymphoma  Patient recently had a urinary tract infection for which patient is recovering.  PET scan has been reviewed and shows progressive disease in the right external iliac lymph node on the right side patient's pain has been localized on the left side/  Family was advised not to draw any more fluid can receive further pain which appears to   PLEURITIC  in nature is resolved.  Patient does not want any narcotic pain medication tremor and all in combination of tramadol and Tylenol was advised.  Litty of chemotherapy with Cytoxan [place] and prednisone can be considered as most likely pain is secondary to progressive lymphoma      Patient is having a lot of reflux. Takes prilosec but it does not seem to be working well for her anymore.    She was advised to increase Protonix twice a day. Advised to use Mylanta   OFF AND ON    We also discussed hospice and palliative care  No matching staging information was found for the patient.  Forest Gleason, MD   06/22/2015 4:11 PM    .:  .

## 2015-06-23 ENCOUNTER — Ambulatory Visit: Payer: Self-pay | Admitting: Family Medicine

## 2015-06-24 ENCOUNTER — Inpatient Hospital Stay (HOSPITAL_BASED_OUTPATIENT_CLINIC_OR_DEPARTMENT_OTHER): Payer: Medicare Other | Admitting: Oncology

## 2015-06-24 ENCOUNTER — Encounter: Payer: Self-pay | Admitting: Oncology

## 2015-06-24 VITALS — BP 121/49 | HR 63 | Temp 97.6°F | Resp 18 | Wt 119.0 lb

## 2015-06-24 DIAGNOSIS — Z800 Family history of malignant neoplasm of digestive organs: Secondary | ICD-10-CM

## 2015-06-24 DIAGNOSIS — R0602 Shortness of breath: Secondary | ICD-10-CM

## 2015-06-24 DIAGNOSIS — N390 Urinary tract infection, site not specified: Secondary | ICD-10-CM | POA: Diagnosis not present

## 2015-06-24 DIAGNOSIS — J90 Pleural effusion, not elsewhere classified: Secondary | ICD-10-CM | POA: Diagnosis not present

## 2015-06-24 DIAGNOSIS — E119 Type 2 diabetes mellitus without complications: Secondary | ICD-10-CM

## 2015-06-24 DIAGNOSIS — Z801 Family history of malignant neoplasm of trachea, bronchus and lung: Secondary | ICD-10-CM

## 2015-06-24 DIAGNOSIS — K219 Gastro-esophageal reflux disease without esophagitis: Secondary | ICD-10-CM

## 2015-06-24 DIAGNOSIS — E7800 Pure hypercholesterolemia, unspecified: Secondary | ICD-10-CM

## 2015-06-24 DIAGNOSIS — C8290 Follicular lymphoma, unspecified, unspecified site: Secondary | ICD-10-CM | POA: Diagnosis not present

## 2015-06-24 DIAGNOSIS — Z9225 Personal history of immunosupression therapy: Secondary | ICD-10-CM

## 2015-06-24 DIAGNOSIS — Z9181 History of falling: Secondary | ICD-10-CM

## 2015-06-24 DIAGNOSIS — Z87891 Personal history of nicotine dependence: Secondary | ICD-10-CM

## 2015-06-24 DIAGNOSIS — R0789 Other chest pain: Secondary | ICD-10-CM | POA: Diagnosis not present

## 2015-06-24 DIAGNOSIS — F418 Other specified anxiety disorders: Secondary | ICD-10-CM

## 2015-06-24 DIAGNOSIS — M549 Dorsalgia, unspecified: Secondary | ICD-10-CM

## 2015-06-24 DIAGNOSIS — Z79899 Other long term (current) drug therapy: Secondary | ICD-10-CM

## 2015-06-24 DIAGNOSIS — C8230 Follicular lymphoma grade IIIa, unspecified site: Secondary | ICD-10-CM

## 2015-06-24 DIAGNOSIS — I10 Essential (primary) hypertension: Secondary | ICD-10-CM

## 2015-06-24 DIAGNOSIS — Z8052 Family history of malignant neoplasm of bladder: Secondary | ICD-10-CM

## 2015-06-24 NOTE — Progress Notes (Signed)
Patient ID: Caroline Russo, female   DOB: 22-Dec-1919, 82 y.o.   MRN: [id number] Cardiology Office Note  Date:  06/25/2015   ID:  Caroline Russo, DOB [birth year], MRN [id number]  PCP:  Dicky Doe, MD   Chief complaint, cough, shortness of breath  HPI:  Caroline Russo is a pleasant 82 year old woman with hypertension, nonobstructive coronary artery disease in 2002, diabetes, obesity who presents for routine followup of her coronary artery disease, hypertension  Diagnosis of lymphoma, Chemotherapy has been completed, she is on Rituxan every 3 months Chronic dizziness,  with previous Blood pressure measurements showing orthostasis.  HCTZ was held in the past, losartan dose was decreased.   In follow-up, she presents with her daughter Weight continues to drop, down 4 pounds from her prior clinic visit Denies having as much as dizziness and nausea as before Coughing Symptoms seem to have improved after thoracentesis with Pleurx catheter placed, draining once per week 250 cc recently followed by oncology Trying to do some exercises at home, currently living with her daughter Chronic constipation an issue She is walking with a cane, no recent falls Daughter is concerned about weight loss, wonders if she should restart Remeron  Previous Holter monitor showing APCs, PVCs, rare short runs of atrial tachycardia  EKG on today's visit shows normal sinus rhythm with rate 65 bpm, rare APC him a low voltage in the limb leads  Other past medical history  fall while in the bathroom 06/09/2014, hit her right eye History ofrecurrent pleural effusion, with Pleurx catheter.  Weight continues to trend lower, previously 158 pounds, down to 150 pounds,  130 pounds, now 122 pounds  Previously treated with Rituxan, then Treanda and Rituxan with steroids  In the hospital May 2015 CT scan showed moderate right pleural effusion, diffuse adenopathy CT scan of the head without metastases Hospital records from  11/11/2013 detail diagnosis of follicular lymphoma with chronic right-sided pleural effusion She was discharged from the hospital October 20, admission on October 17  Chronic problem with her vision. Hearing problem  She has macular degeneration.   Previous history of palpitations and event monitor was performed that showed occasional sinus bradycardia., Normal sinus rhythm mostly with frequent APCs. No other significant arrhythmia   PMH:   has a past medical history of HTN (hypertension); DM2 (diabetes mellitus, type 2) ([place]); Hypercholesterolemia; Murmur; Tinnitus; Macular degeneration; Chronic chest pain; Palpitations; Anxiety; Depression; Irregular cardiac rhythm; Heart murmur; Cataract; Non Hodgkin's lymphoma ([place]); Hypomagnesemia (06/13/2014); Non-obstructive CAD; and Orthostatic hypotension.  PSH:    Past Surgical History  Procedure Laterality Date  . Cardiac catheterization  2002  . Tumor removal  2012    benign neck tumor removed  . Tooth extraction    . Knee surgery Left   . Ankle surgery Left   . Partial hysterectomy    . Insertion / placement pleural catheter      Current Outpatient Prescriptions  Medication Sig Dispense Refill  . ALPRAZolam (XANAX) 0.25 MG tablet Take 1 tablet (0.25 mg total) by mouth every 6 (six) hours as needed. 30 tablet 1  . cyclobenzaprine (FLEXERIL) 10 MG tablet Take 10 mg by mouth 2 (two) times daily as needed for muscle spasms.    . Diphenhyd-Hydrocort-Nystatin (FIRST-DUKES MOUTHWASH MT)   2  . DOK 100 MG capsule TAKE 1 CAPSULE(100 MG) BY MOUTH TWICE DAILY 60 capsule 0  . fluticasone (FLONASE) 50 MCG/ACT nasal spray Place 1 spray into both nostrils daily. 16 g 6  . lidocaine-prilocaine (  EMLA) cream Apply 1 application topically as needed. 30 g 3  . losartan (COZAAR) 50 MG tablet TAKE 1 TABLET BY MOUTH DAILY AFTER BREAKFAST. 90 tablet 3  . meclizine (ANTIVERT) 12.5 MG tablet Take 12.5 mg by mouth 3 (three) times daily as needed.    . Multiple  Vitamin (MULTIVITAMIN WITH MINERALS) TABS tablet Take 1 tablet by mouth daily.    . nitroGLYCERIN (NITROSTAT) 0.4 MG SL tablet Place 0.4 mg under the tongue every 5 (five) minutes as needed.      . pantoprazole (PROTONIX) 40 MG tablet Take 1 tablet (40 mg total) by mouth daily. 30 tablet 6  . polyethylene glycol powder (GLYCOLAX/MIRALAX) powder Take 17 g by mouth daily as needed. 3350 g 0  . SLOW-MAG 71.5-119 MG TBEC SR tablet TAKE 1 TABLET (64 MG TOTAL) BY MOUTH 2 (TWO) TIMES DAILY. 60 tablet 3  . traMADol (ULTRAM) 50 MG tablet Take 1 tablet by mouth every 4-6 hours as needed for pain 90 tablet 0   No current facility-administered medications for this visit.   Facility-Administered Medications Ordered in Other Visits  Medication Dose Route Frequency Provider Last Rate Last Dose  . heparin lock flush 100 unit/mL  500 Units Intravenous Once Forest Gleason, MD   500 Units at 07/24/14 1630  . sodium chloride 0.9 % injection 10 mL  10 mL Intravenous PRN Forest Gleason, MD   10 mL at 07/24/14 1020  . sodium chloride 0.9 % injection 10 mL  10 mL Intracatheter PRN Forest Gleason, MD   10 mL at 07/24/14 1020  . sodium chloride 0.9 % injection 10 mL  10 mL Intravenous PRN Forest Gleason, MD   10 mL at 08/12/14 [id number]     Allergies:   Moxifloxacin; Codeine; Penicillins; Prednisone; and Unasyn   Social History:  The patient  reports that she quit smoking about 27 years ago. Her smoking use included Cigarettes. She has a 1.75 pack-year smoking history. She has never used smokeless tobacco. She reports that she does not drink alcohol or use illicit drugs.   Family History:   family history includes Bladder Cancer in her brother; Heart disease in her mother; Lung cancer in her brother; Stomach cancer in her brother.    Review of Systems: Review of Systems  Constitutional: Positive for malaise/fatigue.       Gait instability  Respiratory: Negative.   Cardiovascular: Negative.   Gastrointestinal: Negative.    Musculoskeletal: Negative.   Neurological: Positive for weakness.  Psychiatric/Behavioral: Negative.   All other systems reviewed and are negative.    PHYSICAL EXAM: VS:  BP 122/62 mmHg  Pulse 65  Ht 5\' 2"  (1.575 m)  Wt 118 lb (53.524 kg)  BMI 21.58 kg/m2 , BMI Body mass index is 21.58 kg/(m^2). GEN: Well nourished, well developed, in no acute distress HEENT: normal Neck: no JVD, carotid bruits, or masses Cardiac: RRR; no murmurs, rubs, or gallops,no edema  Respiratory:  clear to auscultation bilaterally, normal work of breathing, Pleurx catheter in place on the right GI: soft, nontender, nondistended, + BS MS: no deformity or atrophy Skin: warm and dry, no rash Neuro:  Strength and sensation are intact Psych: euthymic mood, full affect    Recent Labs: 05/27/2015: ALT 12*; BUN 14; Creatinine, Ser 0.79; Hemoglobin 11.9*; Magnesium 1.6*; Platelets 202; Potassium 3.8; Sodium 137    Lipid Panel No results found for: CHOL, HDL, LDLCALC, TRIG    Wt Readings from Last 3 Encounters:  06/25/15 118 lb (53.524 kg)  06/24/15 119 lb (53.978 kg)  06/17/15 120 lb 3 oz (54.517 kg)       ASSESSMENT AND PLAN:  PAC (premature atrial contraction) - Plan: EKG 12-Lead Asymptomatic, seen on Holter and on EKG today We'll monitor for now  Essential hypertension - Plan: EKG 12-Lead Blood pressure well controlled, if blood pressure does start to run low would decrease losartan in half  Orthostatic hypotension Currently with no symptoms of orthostasis. Discussed with daughter what to look for She will measure blood pressures with standing at home  Hyperlipidemia No recent lipid panel available.  Follicular lymphoma grade IIIa, unspecified body region [facility]) Managed by oncology Pleurx catheter in place for chronic pleural effusion  Other back pain Chronic back pain, worse at nighttime when supine in bed, relieved by laying in a recliner, sometimes with burning down her left  arm Suspect she may have a back issue, skeletal skeletal. Unable to exclude GERD Discussed various strategies what to do, could try hot pack, could try Pepcid/Tums Less likely cardiac ischemia  Pleural effusion on right Daughter doing good job draining once per week Cough dramatically improved   Disposition:   F/U  6 months   Total encounter time more than 25 minutes  Greater than 50% was spent in counseling and coordination of care with the patient   Orders Placed This Encounter  Procedures  . EKG 12-Lead     Signed, Esmond Plants, M.D., Ph.D. 06/25/2015  [place], [place]

## 2015-06-25 ENCOUNTER — Ambulatory Visit (INDEPENDENT_AMBULATORY_CARE_PROVIDER_SITE_OTHER): Payer: Medicare Other | Admitting: Cardiovascular Disease

## 2015-06-25 ENCOUNTER — Encounter: Payer: Self-pay | Admitting: Cardiovascular Disease

## 2015-06-25 ENCOUNTER — Encounter (INDEPENDENT_AMBULATORY_CARE_PROVIDER_SITE_OTHER): Payer: Self-pay

## 2015-06-25 VITALS — BP 122/62 | HR 65 | Ht 62.0 in | Wt 118.0 lb

## 2015-06-25 DIAGNOSIS — I951 Orthostatic hypotension: Secondary | ICD-10-CM

## 2015-06-25 DIAGNOSIS — M5489 Other dorsalgia: Secondary | ICD-10-CM

## 2015-06-25 DIAGNOSIS — I10 Essential (primary) hypertension: Secondary | ICD-10-CM | POA: Diagnosis not present

## 2015-06-25 DIAGNOSIS — J948 Other specified pleural conditions: Secondary | ICD-10-CM

## 2015-06-25 DIAGNOSIS — J90 Pleural effusion, not elsewhere classified: Secondary | ICD-10-CM

## 2015-06-25 DIAGNOSIS — E785 Hyperlipidemia, unspecified: Secondary | ICD-10-CM | POA: Diagnosis not present

## 2015-06-25 DIAGNOSIS — I491 Atrial premature depolarization: Secondary | ICD-10-CM

## 2015-06-25 DIAGNOSIS — C8230 Follicular lymphoma grade IIIa, unspecified site: Secondary | ICD-10-CM

## 2015-06-25 NOTE — Patient Instructions (Addendum)
You are doing well.  Monitor blood pressure  If running low, cut the losartan in 1/2 daily  Please call us if you have new issues that need to be addressed before your next appt.  Your physician wants you to follow-up in: 6 months.  You will receive a reminder letter in the mail two months in advance. If you don't receive a letter, please call our office to schedule the follow-up appointment.

## 2015-06-27 ENCOUNTER — Encounter: Payer: Self-pay | Admitting: Oncology

## 2015-06-27 NOTE — Progress Notes (Signed)
[place] @ [facility] [phone]  Fax:([phone]) [place]: 1919-12-31  MR#: [id number]  YOV#:[id number]  Patient Care Team: Arlis Porta., MD as PCP - General (Unknown Physician Specialty) Minna Merritts, MD as Consulting Physician (Cardiology)  CHIEF COMPLAINT:  Chief Complaint  Patient presents with  . Foliccular lymphoma    Oncology History   1. Biopsy of para-aortic lymph node is consistent with follicular lymphoma, clinically stage as 3A Diagnosis in July of 2015. Started on rituximab on July 31, 2013 2. Started on [place]  and Rituxan on November 04, 2013 3 maintenance rituximab started from April of 2016      Lymphoma [facility])   12/12/2013 Initial Diagnosis Lymphoma      INTERVAL HISTORY: 82 year old lady with a history of follicular lymphoma recently fell down.  Patient is gradually improving.  Has gained weight. No chills fever appetite has been improving.  Patient had dental treatment done with all teeth removed following that patient can eat better.  No nausea no vomiting no diarrhea.  Patient's condition has been declining.  As increasing back pain. Recent PET scan is been it reviewed independently  and shows interval increase in the size and metabolic activity of single right external iliac lymph node.  Patient had moderate right pleural effusion with drainage catheter in place  Patient has significant chest wall pain continues DULL aching.  Pleural fluid being removed frequently. Declining performance status Pain is improved. Patient just had 250 cc of fluid removed  REVIEW OF SYSTEMS:   Gen. status.  General condition is somewhat improved.  At home patient is ambulating.  Lungs: Shortness of breath on exertion.  Cardiac: No chest pain.  Skin: No rash.  Multiple ecchymotic or area\neurological system: No headache.  No dizziness.  No other focal symptoms.  No lower extremity edema.  Abdomen: No abdominal pain.  No nausea.  No  vomiting.  No diarrhea.  No rectal bleeding.. GU: No dysuria or hematuria Lower extremity no swelling.  After all teeth removed and getting new denture patient is eating somewhat better Musculoskeletal system intrascapular pain Neurological system: No occasional dizziness.  No other focal signs patienr Intrascapular pain associated with breathing and coughing Patient has a Pleurx catheter and had 200cc of pleural fluid removed As per HPI. Otherwise, a complete review of systems is negatve.  PAST MEDICAL HISTORY: Past Medical History  Diagnosis Date  . HTN (hypertension)   . DM2 (diabetes mellitus, type 2) ([facility])     insulin requiring  . Hypercholesterolemia   . Murmur     hx  . Tinnitus     chronic  . Macular degeneration   . Chronic chest pain     a. nonobs cath 2002.  Marland Kitchen Palpitations     a. 01/2011 Holter monitor showed frequent PACs, sinus bradycardia  . Anxiety   . Depression   . Irregular cardiac rhythm   . Heart murmur     a. 09/2014 Echo: EF 55-60%, nl wm, mod MS, mild MR, mod dil LA, mild-mod TR.  . Cataract   . Non Hodgkin's lymphoma ([place])   . Hypomagnesemia 06/13/2014  . Non-obstructive CAD   . Orthostatic hypotension     PAST SURGICAL HISTORY: Past Surgical History  Procedure Laterality Date  . Cardiac catheterization  2002  . Tumor removal  2012    benign neck tumor removed  . Tooth extraction    . Knee surgery Left   . Ankle  surgery Left   . Partial hysterectomy    . Insertion / placement pleural catheter      FAMILY HISTORY Family History  Problem Relation Age of Onset  . Heart disease Mother   . Lung cancer Brother     smoked  . Bladder Cancer Brother   . Stomach cancer Brother       ADVANCED DIRECTIVES: Patient does have living will   HEALTH MAINTENANCE: Social History  Substance Use Topics  . Smoking status: Former Smoker -- 0.25 packs/day for 7 years    Types: Cigarettes    Quit date: 01/25/1988  . Smokeless tobacco: Never Used  .  Alcohol Use: No       Allergies  Allergen Reactions  . Moxifloxacin Shortness Of Breath    hallucinations  . Codeine Other (See Comments)    unknown  . Penicillins     Itching  Rash   . Prednisone     Hallucinations  . Unasyn [Ampicillin-Sulbactam Sodium] Other (See Comments)    unknown    Current Outpatient Prescriptions  Medication Sig Dispense Refill  . ALPRAZolam (XANAX) 0.25 MG tablet Take 1 tablet (0.25 mg total) by mouth every 6 (six) hours as needed. 30 tablet 1  . cyclobenzaprine (FLEXERIL) 10 MG tablet Take 10 mg by mouth 2 (two) times daily as needed for muscle spasms.    . Diphenhyd-Hydrocort-Nystatin (FIRST-DUKES MOUTHWASH MT)   2  . DOK 100 MG capsule TAKE 1 CAPSULE(100 MG) BY MOUTH TWICE DAILY 60 capsule 0  . fluticasone (FLONASE) 50 MCG/ACT nasal spray Place 1 spray into both nostrils daily. 16 g 6  . lidocaine-prilocaine (EMLA) cream Apply 1 application topically as needed. 30 g 3  . losartan (COZAAR) 50 MG tablet TAKE 1 TABLET BY MOUTH DAILY AFTER BREAKFAST. 90 tablet 3  . meclizine (ANTIVERT) 12.5 MG tablet Take 12.5 mg by mouth 3 (three) times daily as needed.    . Multiple Vitamin (MULTIVITAMIN WITH MINERALS) TABS tablet Take 1 tablet by mouth daily.    . nitroGLYCERIN (NITROSTAT) 0.4 MG SL tablet Place 0.4 mg under the tongue every 5 (five) minutes as needed.      . pantoprazole (PROTONIX) 40 MG tablet Take 1 tablet (40 mg total) by mouth daily. 30 tablet 6  . polyethylene glycol powder (GLYCOLAX/MIRALAX) powder Take 17 g by mouth daily as needed. 3350 g 0  . SLOW-MAG 71.5-119 MG TBEC SR tablet TAKE 1 TABLET (64 MG TOTAL) BY MOUTH 2 (TWO) TIMES DAILY. 60 tablet 3  . traMADol (ULTRAM) 50 MG tablet Take 1 tablet by mouth every 4-6 hours as needed for pain 90 tablet 0   No current facility-administered medications for this visit.   Facility-Administered Medications Ordered in Other Visits  Medication Dose Route Frequency Provider Last Rate Last Dose  .  heparin lock flush 100 unit/mL  500 Units Intravenous Once Forest Gleason, MD   500 Units at 07/24/14 1630  . sodium chloride 0.9 % injection 10 mL  10 mL Intravenous PRN Forest Gleason, MD   10 mL at 07/24/14 1020  . sodium chloride 0.9 % injection 10 mL  10 mL Intracatheter PRN Forest Gleason, MD   10 mL at 07/24/14 1020  . sodium chloride 0.9 % injection 10 mL  10 mL Intravenous PRN Forest Gleason, MD   10 mL at 08/12/14 0834    OBJECTIVE:  Filed Vitals:   06/24/15 1033  BP: 121/49  Pulse: 63  Temp: 97.6 F (36.4 C)  Resp: 18     Body mass index is 20.75 kg/(m^2).    ECOG FS:2 - Symptomatic, <50% confined to bed  PHYSICAL EXAM: Gen. status: Patient is in wheelchair.  Lymphatic system: No palpable supraclavicular cervical axillary inguinal adenopathy.  Lungs: Diminished air entry on both sides.  No rhonchi.  No rales.  Patient does have Pleurx catheter still draining well pleural rub  Cardiac: Normal heart sounds.  Soft systolic murmur. Lymphatic system: Supraclavicular, cervical, axillary, inguinal lymph nodes are not palpable Abdomen: Soft.  No palpable masses no tenderness no ascites liver and spleen not palpable. Lower extremity trace edema Skin: Multiple ecchymosis Neurological system: Higher functions within normal limit.  Cranial nerves are intact.  Motor and sensory systems within normal limit All other systems were examined HEENT: No abnormality HEENT: No evidence of any infection in the gums or teeth.  No soreness in the mouth Cardiac: No case no irregular heart sounds   LAB RESULTS:  No visits with results within 3 Day(s) from this visit. Latest known visit with results is:  Appointment on 05/27/2015  Component Date Value Ref Range Status  . WBC 05/27/2015 4.0  3.6 - 11.0 K/uL Final  . RBC 05/27/2015 4.18  3.80 - 5.20 MIL/uL Final  . Hemoglobin 05/27/2015 11.9* 12.0 - 16.0 g/dL Final  . HCT 05/27/2015 35.0  35.0 - 47.0 % Final  . MCV 05/27/2015 83.7  80.0 - 100.0 fL Final   . MCH 05/27/2015 28.4  26.0 - 34.0 pg Final  . MCHC 05/27/2015 33.9  32.0 - 36.0 g/dL Final  . RDW 05/27/2015 14.8* 11.5 - 14.5 % Final  . Platelets 05/27/2015 202  150 - 440 K/uL Final  . Neutrophils Relative % 05/27/2015 21%   Final  . Neutro Abs 05/27/2015 0.9* 1.4 - 6.5 K/uL Final  . Lymphocytes Relative 05/27/2015 56%   Final  . Lymphs Abs 05/27/2015 2.3  1.0 - 3.6 K/uL Final  . Monocytes Relative 05/27/2015 18%   Final  . Monocytes Absolute 05/27/2015 0.7  0.2 - 0.9 K/uL Final  . Eosinophils Relative 05/27/2015 4%   Final  . Eosinophils Absolute 05/27/2015 0.2  0 - 0.7 K/uL Final  . Basophils Relative 05/27/2015 1%   Final  . Basophils Absolute 05/27/2015 0.0  0 - 0.1 K/uL Final  . Smear Review 05/27/2015 SMEAR SCANNED   Corrected  . Sodium 05/27/2015 137  135 - 145 mmol/L Final  . Potassium 05/27/2015 3.8  3.5 - 5.1 mmol/L Final  . Chloride 05/27/2015 104  101 - 111 mmol/L Final  . CO2 05/27/2015 24  22 - 32 mmol/L Final  . Glucose, Bld 05/27/2015 134* 65 - 99 mg/dL Final  . BUN 05/27/2015 14  6 - 20 mg/dL Final  . Creatinine, Ser 05/27/2015 0.79  0.44 - 1.00 mg/dL Final  . Calcium 05/27/2015 8.7* 8.9 - 10.3 mg/dL Final  . Total Protein 05/27/2015 6.2* 6.5 - 8.1 g/dL Final  . Albumin 05/27/2015 3.5  3.5 - 5.0 g/dL Final  . AST 05/27/2015 22  15 - 41 U/L Final  . ALT 05/27/2015 12* 14 - 54 U/L Final  . Alkaline Phosphatase 05/27/2015 87  38 - 126 U/L Final  . Total Bilirubin 05/27/2015 0.5  0.3 - 1.2 mg/dL Final  . GFR calc non Af Amer 05/27/2015 >60  >60 mL/min Final  . GFR calc Af Amer 05/27/2015 >60  >60 mL/min Final   Comment: (NOTE) The eGFR has been calculated using the CKD EPI equation.  This calculation has not been validated in all clinical situations. eGFR's persistently <60 mL/min signify possible Chronic Kidney Disease.   . Anion gap 05/27/2015 9  5 - 15 Final  . Magnesium 05/27/2015 1.6* 1.7 - 2.4 mg/dL Final       ASSESSMENT: 30.  82 year old lady with  follicular lymphoma  Patient recently had a urinary tract infection for which patient is recovering.  PET scan has been reviewed and shows progressive disease in the right external iliac lymph node on the right side patient's pain has been localized on the left side/  Family was advised not to draw any more fluid can receive further pain which appears to   PLEURITIC  in nature is resolved.  Patient does not want any narcotic pain medication tremor and all in combination of tramadol and Tylenol was advised.  Trial of  chemotherapy with Cytoxan Oncovin and prednisone can be considered as most likely pain is secondary to progressive lymphoma   Gastroesophageal reflux has improved after patient started taking H2 blocker or bone pump inhibitor Using Mylanta time between Back pain is improved Continue follow-up without intervention need being continues a repeat PET scan can be considered.   We also discussed hospice and palliative care  No matching staging information was found for the patient.  Forest Gleason, MD   06/27/2015 4:18 PM    .:  .

## 2015-06-29 ENCOUNTER — Telehealth: Payer: Self-pay | Admitting: *Deleted

## 2015-06-29 NOTE — Telephone Encounter (Signed)
Has cold with cough with clear mucous no fever. Using Mucinex and Tessalon Perles Asking if this is appropriate tx. I called back and told Ms Garraway that if she starts to run fever or coughing up sputum with color to call back and that mucinex and tessalon is fine to use

## 2015-07-02 ENCOUNTER — Inpatient Hospital Stay: Payer: Medicare Other | Attending: Internal Medicine

## 2015-07-02 DIAGNOSIS — Z9221 Personal history of antineoplastic chemotherapy: Secondary | ICD-10-CM | POA: Diagnosis not present

## 2015-07-02 DIAGNOSIS — R002 Palpitations: Secondary | ICD-10-CM | POA: Diagnosis not present

## 2015-07-02 DIAGNOSIS — F418 Other specified anxiety disorders: Secondary | ICD-10-CM | POA: Diagnosis not present

## 2015-07-02 DIAGNOSIS — Z801 Family history of malignant neoplasm of trachea, bronchus and lung: Secondary | ICD-10-CM | POA: Diagnosis not present

## 2015-07-02 DIAGNOSIS — Z794 Long term (current) use of insulin: Secondary | ICD-10-CM | POA: Insufficient documentation

## 2015-07-02 DIAGNOSIS — Z87891 Personal history of nicotine dependence: Secondary | ICD-10-CM | POA: Diagnosis not present

## 2015-07-02 DIAGNOSIS — Z8052 Family history of malignant neoplasm of bladder: Secondary | ICD-10-CM | POA: Insufficient documentation

## 2015-07-02 DIAGNOSIS — Z79899 Other long term (current) drug therapy: Secondary | ICD-10-CM | POA: Insufficient documentation

## 2015-07-02 DIAGNOSIS — I499 Cardiac arrhythmia, unspecified: Secondary | ICD-10-CM | POA: Diagnosis not present

## 2015-07-02 DIAGNOSIS — Z800 Family history of malignant neoplasm of digestive organs: Secondary | ICD-10-CM | POA: Insufficient documentation

## 2015-07-02 DIAGNOSIS — J069 Acute upper respiratory infection, unspecified: Secondary | ICD-10-CM | POA: Diagnosis not present

## 2015-07-02 DIAGNOSIS — R630 Anorexia: Secondary | ICD-10-CM | POA: Insufficient documentation

## 2015-07-02 DIAGNOSIS — E7800 Pure hypercholesterolemia, unspecified: Secondary | ICD-10-CM | POA: Insufficient documentation

## 2015-07-02 DIAGNOSIS — J90 Pleural effusion, not elsewhere classified: Secondary | ICD-10-CM | POA: Insufficient documentation

## 2015-07-02 DIAGNOSIS — D709 Neutropenia, unspecified: Secondary | ICD-10-CM | POA: Insufficient documentation

## 2015-07-02 DIAGNOSIS — R109 Unspecified abdominal pain: Secondary | ICD-10-CM | POA: Insufficient documentation

## 2015-07-02 DIAGNOSIS — C8232 Follicular lymphoma grade IIIa, intrathoracic lymph nodes: Secondary | ICD-10-CM | POA: Insufficient documentation

## 2015-07-02 DIAGNOSIS — H3530 Unspecified macular degeneration: Secondary | ICD-10-CM | POA: Diagnosis not present

## 2015-07-02 DIAGNOSIS — I10 Essential (primary) hypertension: Secondary | ICD-10-CM | POA: Insufficient documentation

## 2015-07-02 DIAGNOSIS — Z452 Encounter for adjustment and management of vascular access device: Secondary | ICD-10-CM | POA: Diagnosis not present

## 2015-07-02 DIAGNOSIS — E119 Type 2 diabetes mellitus without complications: Secondary | ICD-10-CM | POA: Diagnosis not present

## 2015-07-02 DIAGNOSIS — Z95828 Presence of other vascular implants and grafts: Secondary | ICD-10-CM

## 2015-07-02 DIAGNOSIS — R634 Abnormal weight loss: Secondary | ICD-10-CM | POA: Diagnosis not present

## 2015-07-02 DIAGNOSIS — R011 Cardiac murmur, unspecified: Secondary | ICD-10-CM | POA: Insufficient documentation

## 2015-07-02 MED ORDER — SODIUM CHLORIDE 0.9% FLUSH
10.0000 mL | INTRAVENOUS | Status: DC | PRN
  Administered 2015-07-02: 10 mL via INTRAVENOUS
  Filled 2015-07-02: qty 10

## 2015-07-02 MED ORDER — HEPARIN SOD (PORK) LOCK FLUSH 100 UNIT/ML IV SOLN
500.0000 [IU] | Freq: Once | INTRAVENOUS | Status: AC
  Administered 2015-07-02: 500 [IU] via INTRAVENOUS

## 2015-07-03 ENCOUNTER — Other Ambulatory Visit: Payer: Self-pay | Admitting: Oncology

## 2015-07-06 ENCOUNTER — Ambulatory Visit: Payer: Medicare Other | Admitting: Internal Medicine

## 2015-07-06 ENCOUNTER — Telehealth: Payer: Self-pay | Admitting: *Deleted

## 2015-07-06 ENCOUNTER — Ambulatory Visit
Admission: RE | Admit: 2015-07-06 | Discharge: 2015-07-06 | Disposition: A | Discharge status: Home / Self Care | Payer: Medicare Other | Source: Ambulatory Visit | Attending: Internal Medicine | Admitting: Internal Medicine

## 2015-07-06 DIAGNOSIS — J9811 Atelectasis: Secondary | ICD-10-CM | POA: Insufficient documentation

## 2015-07-06 DIAGNOSIS — M5489 Other dorsalgia: Secondary | ICD-10-CM | POA: Insufficient documentation

## 2015-07-06 DIAGNOSIS — J90 Pleural effusion, not elsewhere classified: Secondary | ICD-10-CM | POA: Insufficient documentation

## 2015-07-06 DIAGNOSIS — C8230 Follicular lymphoma grade IIIa, unspecified site: Secondary | ICD-10-CM | POA: Diagnosis not present

## 2015-07-06 DIAGNOSIS — R05 Cough: Secondary | ICD-10-CM | POA: Diagnosis not present

## 2015-07-06 NOTE — Telephone Encounter (Signed)
Per Dr Rogue Bussing come in for CXR, CBC, CMP, Mg+ Agrees to come in for CXR at 1 and lab afterwards and will wait for results

## 2015-07-06 NOTE — Telephone Encounter (Signed)
Called to report that she has a cough that will not go away. No fever, Also reports that she is having pain in her back at the Pleurex cath site which is stabbing in nature. States that she is so weak that she fell last night. Asking if she can be seen by Dr B this morning

## 2015-07-07 ENCOUNTER — Inpatient Hospital Stay (HOSPITAL_BASED_OUTPATIENT_CLINIC_OR_DEPARTMENT_OTHER): Payer: Medicare Other | Admitting: Family Medicine

## 2015-07-07 ENCOUNTER — Inpatient Hospital Stay: Payer: Medicare Other

## 2015-07-07 VITALS — BP 116/69 | HR 66 | Temp 99.7°F | Resp 20 | Wt 117.6 lb

## 2015-07-07 DIAGNOSIS — Z8052 Family history of malignant neoplasm of bladder: Secondary | ICD-10-CM

## 2015-07-07 DIAGNOSIS — Z87891 Personal history of nicotine dependence: Secondary | ICD-10-CM

## 2015-07-07 DIAGNOSIS — Z801 Family history of malignant neoplasm of trachea, bronchus and lung: Secondary | ICD-10-CM

## 2015-07-07 DIAGNOSIS — R011 Cardiac murmur, unspecified: Secondary | ICD-10-CM

## 2015-07-07 DIAGNOSIS — F418 Other specified anxiety disorders: Secondary | ICD-10-CM

## 2015-07-07 DIAGNOSIS — E7800 Pure hypercholesterolemia, unspecified: Secondary | ICD-10-CM

## 2015-07-07 DIAGNOSIS — C8232 Follicular lymphoma grade IIIa, intrathoracic lymph nodes: Secondary | ICD-10-CM

## 2015-07-07 DIAGNOSIS — Z794 Long term (current) use of insulin: Secondary | ICD-10-CM

## 2015-07-07 DIAGNOSIS — Z9221 Personal history of antineoplastic chemotherapy: Secondary | ICD-10-CM

## 2015-07-07 DIAGNOSIS — R109 Unspecified abdominal pain: Secondary | ICD-10-CM | POA: Diagnosis not present

## 2015-07-07 DIAGNOSIS — Z452 Encounter for adjustment and management of vascular access device: Secondary | ICD-10-CM | POA: Diagnosis not present

## 2015-07-07 DIAGNOSIS — R630 Anorexia: Secondary | ICD-10-CM | POA: Diagnosis not present

## 2015-07-07 DIAGNOSIS — I10 Essential (primary) hypertension: Secondary | ICD-10-CM | POA: Diagnosis not present

## 2015-07-07 DIAGNOSIS — M5489 Other dorsalgia: Secondary | ICD-10-CM

## 2015-07-07 DIAGNOSIS — J069 Acute upper respiratory infection, unspecified: Secondary | ICD-10-CM | POA: Diagnosis not present

## 2015-07-07 DIAGNOSIS — H3530 Unspecified macular degeneration: Secondary | ICD-10-CM

## 2015-07-07 DIAGNOSIS — Z800 Family history of malignant neoplasm of digestive organs: Secondary | ICD-10-CM

## 2015-07-07 DIAGNOSIS — E119 Type 2 diabetes mellitus without complications: Secondary | ICD-10-CM

## 2015-07-07 DIAGNOSIS — R002 Palpitations: Secondary | ICD-10-CM

## 2015-07-07 DIAGNOSIS — I499 Cardiac arrhythmia, unspecified: Secondary | ICD-10-CM

## 2015-07-07 DIAGNOSIS — C8230 Follicular lymphoma grade IIIa, unspecified site: Secondary | ICD-10-CM

## 2015-07-07 DIAGNOSIS — R634 Abnormal weight loss: Secondary | ICD-10-CM | POA: Diagnosis not present

## 2015-07-07 DIAGNOSIS — Z79899 Other long term (current) drug therapy: Secondary | ICD-10-CM

## 2015-07-07 DIAGNOSIS — J90 Pleural effusion, not elsewhere classified: Secondary | ICD-10-CM | POA: Diagnosis not present

## 2015-07-07 LAB — CBC WITH DIFFERENTIAL/PLATELET
BASOS PCT: 1 %
Basophils Absolute: 0 10*3/uL (ref 0–0.1)
EOS ABS: 0 10*3/uL (ref 0–0.7)
Eosinophils Relative: 1 %
HCT: 36.7 % (ref 35.0–47.0)
Hemoglobin: 12.3 g/dL (ref 12.0–16.0)
Lymphocytes Relative: 34 %
Lymphs Abs: 0.7 10*3/uL — ABNORMAL LOW (ref 1.0–3.6)
MCH: 28.4 pg (ref 26.0–34.0)
MCHC: 33.5 g/dL (ref 32.0–36.0)
MCV: 85 fL (ref 80.0–100.0)
Monocytes Absolute: 0.5 10*3/uL (ref 0.2–0.9)
Monocytes Relative: 28 %
NEUTROS ABS: 0.7 10*3/uL — AB (ref 1.4–6.5)
NEUTROS PCT: 36 %
PLATELETS: 164 10*3/uL (ref 150–440)
RBC: 4.32 MIL/uL (ref 3.80–5.20)
RDW: 15.8 % — ABNORMAL HIGH (ref 11.5–14.5)
WBC: 1.9 10*3/uL — AB (ref 3.6–11.0)

## 2015-07-07 LAB — COMPREHENSIVE METABOLIC PANEL
ALK PHOS: 93 U/L (ref 38–126)
ALT: 10 U/L — ABNORMAL LOW (ref 14–54)
ANION GAP: 8 (ref 5–15)
AST: 23 U/L (ref 15–41)
Albumin: 3.3 g/dL — ABNORMAL LOW (ref 3.5–5.0)
BILIRUBIN TOTAL: 0.7 mg/dL (ref 0.3–1.2)
BUN: 10 mg/dL (ref 6–20)
CALCIUM: 8.3 mg/dL — AB (ref 8.9–10.3)
CO2: 26 mmol/L (ref 22–32)
Chloride: 97 mmol/L — ABNORMAL LOW (ref 101–111)
Creatinine, Ser: 0.88 mg/dL (ref 0.44–1.00)
GFR calc non Af Amer: 55 mL/min — ABNORMAL LOW (ref >60)
Glucose, Bld: 219 mg/dL — ABNORMAL HIGH (ref 65–99)
POTASSIUM: 3.5 mmol/L (ref 3.5–5.1)
SODIUM: 131 mmol/L — AB (ref 135–145)
TOTAL PROTEIN: 6.6 g/dL (ref 6.5–8.1)

## 2015-07-07 LAB — MAGNESIUM: Magnesium: 1.3 mg/dL — ABNORMAL LOW (ref 1.7–2.4)

## 2015-07-07 MED ORDER — BENZONATATE 100 MG PO CAPS: 100.0000 mg | ORAL_CAPSULE | Freq: Three times a day (TID) | ORAL | Status: DC | PRN

## 2015-07-07 MED ORDER — CEPHALEXIN 500 MG PO CAPS: 500.0000 mg | ORAL_CAPSULE | Freq: Two times a day (BID) | ORAL | Status: DC

## 2015-07-07 NOTE — Progress Notes (Signed)
[place]  Telephone:(336) [phone]  Fax:(336) [phone]     Caroline Russo DOB: [birth year]  MR#: [id number]  PNT#:[id number]  Patient Care Team: Arlis Porta., MD as PCP - General (Unknown Physician Specialty) Minna Merritts, MD as Consulting Physician (Cardiology)  CHIEF COMPLAINT:  Chief Complaint  Patient presents with  . follicular lymphoma  . sick visit    INTERVAL HISTORY: Patient is being seen as an acute add on today. Patient was here for labs this morning but requested to be seen due to increasing cough, chest congestion, clear sputum production. She reports not having checked her temperature at home so she is unsure about any fever. She states that symptoms have been progressively getting worse for the last 10 days or so. She was evaluated in this office approximately 2 weeks ago and was instructed to take Mucinex and [employer], but she never received any Tessalon Perles.  REVIEW OF SYSTEMS:   Review of Systems  Constitutional: Positive for fever and malaise/fatigue. Negative for chills, weight loss and diaphoresis.  HENT: Positive for congestion.   Eyes: Negative.   Respiratory: Positive for cough and sputum production. Negative for hemoptysis, shortness of breath and wheezing.   Cardiovascular: Negative for chest pain, palpitations, orthopnea, claudication, leg swelling and PND.  Gastrointestinal: Negative for heartburn, nausea, vomiting, abdominal pain, diarrhea, constipation, blood in stool and melena.  Genitourinary: Negative.   Musculoskeletal: Negative.   Skin: Negative.   Neurological: Negative for dizziness, tingling, focal weakness, seizures and weakness.  Endo/Heme/Allergies: Does not bruise/bleed easily.  Psychiatric/Behavioral: Negative for depression. The patient is not nervous/anxious and does not have insomnia.     As per HPI. Otherwise, a complete review of systems is negatve.  ONCOLOGY HISTORY: Oncology History   1.  Biopsy of para-aortic lymph node is consistent with follicular lymphoma, clinically stage as 3A Diagnosis in July of 2015. Started on rituximab on July 31, 2013 2. Started on [place]  and Rituxan on November 04, 2013 3 maintenance rituximab started from April of 2016      Lymphoma [facility])   12/12/2013 Initial Diagnosis Lymphoma    PAST MEDICAL HISTORY: Past Medical History  Diagnosis Date  . HTN (hypertension)   . DM2 (diabetes mellitus, type 2) (HCC)     insulin requiring  . Hypercholesterolemia   . Murmur     hx  . Tinnitus     chronic  . Macular degeneration   . Chronic chest pain     a. nonobs cath 2002.  Marland Kitchen Palpitations     a. 01/2011 Holter monitor showed frequent PACs, sinus bradycardia  . Anxiety   . Depression   . Irregular cardiac rhythm   . Heart murmur     a. 09/2014 Echo: EF 55-60%, nl wm, mod MS, mild MR, mod dil LA, mild-mod TR.  . Cataract   . Non Hodgkin's lymphoma ([place])   . Hypomagnesemia 06/13/2014  . Non-obstructive CAD   . Orthostatic hypotension     PAST SURGICAL HISTORY: Past Surgical History  Procedure Laterality Date  . Cardiac catheterization  2002  . Tumor removal  2012    benign neck tumor removed  . Tooth extraction    . Knee surgery Left   . Ankle surgery Left   . Partial hysterectomy    . Insertion / placement pleural catheter      FAMILY HISTORY Family History  Problem Relation Age of Onset  . Heart disease Mother   . Lung  cancer Brother     smoked  . Bladder Cancer Brother   . Stomach cancer Brother     GYNECOLOGIC HISTORY:  No LMP recorded. Patient has had a hysterectomy.     ADVANCED DIRECTIVES:    HEALTH MAINTENANCE: Social History  Substance Use Topics  . Smoking status: Former Smoker -- 0.25 packs/day for 7 years    Types: Cigarettes    Quit date: 01/25/1988  . Smokeless tobacco: Never Used  . Alcohol Use: No     Allergies  Allergen Reactions  . Moxifloxacin Shortness Of Breath    hallucinations  .  Codeine Other (See Comments)    unknown  . Penicillins     Itching  Rash   . Prednisone     Hallucinations  . Unasyn [Ampicillin-Sulbactam Sodium] Other (See Comments)    unknown    Current Outpatient Prescriptions  Medication Sig Dispense Refill  . ALPRAZolam (XANAX) 0.25 MG tablet Take 1 tablet (0.25 mg total) by mouth every 6 (six) hours as needed. 30 tablet 1  . cyclobenzaprine (FLEXERIL) 10 MG tablet Take 10 mg by mouth 2 (two) times daily as needed for muscle spasms.    . Diphenhyd-Hydrocort-Nystatin (FIRST-DUKES MOUTHWASH MT)   2  . DOK 100 MG capsule TAKE 1 CAPSULE(100 MG) BY MOUTH TWICE DAILY 60 capsule 0  . fluticasone (FLONASE) 50 MCG/ACT nasal spray Place 1 spray into both nostrils daily. 16 g 6  . lidocaine-prilocaine (EMLA) cream Apply 1 application topically as needed. 30 g 3  . losartan (COZAAR) 50 MG tablet TAKE 1 TABLET BY MOUTH DAILY AFTER BREAKFAST. 90 tablet 3  . meclizine (ANTIVERT) 12.5 MG tablet Take 12.5 mg by mouth 3 (three) times daily as needed.    . Multiple Vitamin (MULTIVITAMIN WITH MINERALS) TABS tablet Take 1 tablet by mouth daily.    . nitroGLYCERIN (NITROSTAT) 0.4 MG SL tablet Place 0.4 mg under the tongue every 5 (five) minutes as needed.      . pantoprazole (PROTONIX) 40 MG tablet Take 1 tablet (40 mg total) by mouth daily. 30 tablet 6  . polyethylene glycol powder (GLYCOLAX/MIRALAX) powder Take 17 g by mouth daily as needed. 3350 g 0  . SLOW-MAG 71.5-119 MG TBEC SR tablet TAKE 1 TABLET (64 MG TOTAL) BY MOUTH 2 (TWO) TIMES DAILY. 60 tablet 3  . traMADol (ULTRAM) 50 MG tablet Take 1 tablet by mouth every 4-6 hours as needed for pain 90 tablet 0  . benzonatate (TESSALON) 100 MG capsule Take 1 capsule (100 mg total) by mouth 3 (three) times daily as needed for cough. 30 capsule 0  . cephALEXin (KEFLEX) 500 MG capsule Take 1 capsule (500 mg total) by mouth 2 (two) times daily. 30 capsule 0   No current facility-administered medications for this visit.     Facility-Administered Medications Ordered in Other Visits  Medication Dose Route Frequency Provider Last Rate Last Dose  . heparin lock flush 100 unit/mL  500 Units Intravenous Once Forest Gleason, MD   500 Units at 07/24/14 1630  . sodium chloride 0.9 % injection 10 mL  10 mL Intravenous PRN Forest Gleason, MD   10 mL at 07/24/14 1020  . sodium chloride 0.9 % injection 10 mL  10 mL Intracatheter PRN Forest Gleason, MD   10 mL at 07/24/14 1020  . sodium chloride 0.9 % injection 10 mL  10 mL Intravenous PRN Forest Gleason, MD   10 mL at 08/12/14 0834    OBJECTIVE: Temp(Src) 99.1 F (37.3  C) (Tympanic)  Wt 117 lb 9.6 oz (53.343 kg)  SpO2 95%   Body mass index is 21.5 kg/(m^2).    ECOG FS:3 - Symptomatic, >50% confined to bed  General: Well-developed, well-nourished, no acute distress. Eyes: Pink conjunctiva, anicteric sclera. HEENT: Normocephalic, moist mucous membranes, clear oropharnyx. Lungs: Scattered rhonchi with occasional wheezing. Heart: Regular rate and rhythm. No rubs, murmurs, or gallops. Musculoskeletal: No edema, cyanosis, or clubbing. Neuro: Alert, answering all questions appropriately. Cranial nerves grossly intact. Skin: No rashes or petechiae noted. Psych: Normal affect.   LAB RESULTS:  Appointment on 07/07/2015  Component Date Value Ref Range Status  . Sodium 07/07/2015 131* 135 - 145 mmol/L Final  . Potassium 07/07/2015 3.5  3.5 - 5.1 mmol/L Final  . Chloride 07/07/2015 97* 101 - 111 mmol/L Final  . CO2 07/07/2015 26  22 - 32 mmol/L Final  . Glucose, Bld 07/07/2015 219* 65 - 99 mg/dL Final  . BUN 07/07/2015 10  6 - 20 mg/dL Final  . Creatinine, Ser 07/07/2015 0.88  0.44 - 1.00 mg/dL Final  . Calcium 07/07/2015 8.3* 8.9 - 10.3 mg/dL Final  . Total Protein 07/07/2015 6.6  6.5 - 8.1 g/dL Final  . Albumin 07/07/2015 3.3* 3.5 - 5.0 g/dL Final  . AST 07/07/2015 23  15 - 41 U/L Final  . ALT 07/07/2015 10* 14 - 54 U/L Final  . Alkaline Phosphatase 07/07/2015 93  38 - 126  U/L Final  . Total Bilirubin 07/07/2015 0.7  0.3 - 1.2 mg/dL Final  . GFR calc non Af Amer 07/07/2015 55* >60 mL/min Final  . GFR calc Af Amer 07/07/2015 >60  >60 mL/min Final   Comment: (NOTE) The eGFR has been calculated using the CKD EPI equation. This calculation has not been validated in all clinical situations. eGFR's persistently <60 mL/min signify possible Chronic Kidney Disease.   . Anion gap 07/07/2015 8  5 - 15 Final  . Magnesium 07/07/2015 1.3* 1.7 - 2.4 mg/dL Final  . WBC 07/07/2015 1.9* 3.6 - 11.0 K/uL Final  . RBC 07/07/2015 4.32  3.80 - 5.20 MIL/uL Final  . Hemoglobin 07/07/2015 12.3  12.0 - 16.0 g/dL Final  . HCT 07/07/2015 36.7  35.0 - 47.0 % Final  . MCV 07/07/2015 85.0  80.0 - 100.0 fL Final  . MCH 07/07/2015 28.4  26.0 - 34.0 pg Final  . MCHC 07/07/2015 33.5  32.0 - 36.0 g/dL Final  . RDW 07/07/2015 15.8* 11.5 - 14.5 % Final  . Platelets 07/07/2015 164  150 - 440 K/uL Final  . Neutrophils Relative % 07/07/2015 36   Final  . Neutro Abs 07/07/2015 0.7* 1.4 - 6.5 K/uL Final  . Lymphocytes Relative 07/07/2015 34   Final  . Lymphs Abs 07/07/2015 0.7* 1.0 - 3.6 K/uL Final  . Monocytes Relative 07/07/2015 28   Final  . Monocytes Absolute 07/07/2015 0.5  0.2 - 0.9 K/uL Final  . Eosinophils Relative 07/07/2015 1   Final  . Eosinophils Absolute 07/07/2015 0.0  0 - 0.7 K/uL Final  . Basophils Relative 07/07/2015 1   Final  . Basophils Absolute 07/07/2015 0.0  0 - 0.1 K/uL Final  . Smear Review 07/07/2015 SMEAR SCANNED   Corrected    STUDIES: Dg Chest 2 View  07/06/2015  CLINICAL DATA:  Chest congestion and productive cough for 3 weeks. EXAM: CHEST  2 VIEW COMPARISON:  Single view of the chest 02/10/2014. PET CT scan 05/26/2015. FINDINGS: Small bilateral pleural effusions are seen, greater on the  right. Pleural drainage catheter in the right chest is noted. Mild basilar atelectasis is seen. The lungs are otherwise clear. No pneumothorax. Heart size is normal. Port-A-Cath  is in place. IMPRESSION: Small bilateral pleural effusions and mild basilar atelectasis. Pleural drainage catheter on the right is noted. Electronically Signed   By: Inge Rise M.D.   On: 07/06/2015 13:55    ASSESSMENT:  Upper respiratory tract infection.  PLAN:  1. URI. Chest x-ray showed small bilateral pleural effusions and mild basilar atelectasis. Patient does have a Pleurx catheter in place on the right side. We will start Keflex 500 mg twice daily as patient has hallucinations to fluoroquinolones as well as prednisone. Also advised patient's daughter to pick up and continue more Mucinex. Tessalon Perles prescription was sent to the pharmacy today.  Advised patient to contact us if there was any worsening symptoms, spiking fevers, or worsening color of sputum.  Patient expressed understanding and was in agreement with this plan. She also understands that She can call clinic at any time with any questions, concerns, or complaints.   Dr. Rogue Bussing was available for consultation and review of plan of care for this patient.   Evlyn Kanner, NP   07/07/2015 3:23 PM

## 2015-07-07 NOTE — Progress Notes (Signed)
Patient here for sick visit.

## 2015-07-08 ENCOUNTER — Inpatient Hospital Stay: Payer: Medicare Other

## 2015-07-09 DIAGNOSIS — J910 Malignant pleural effusion: Secondary | ICD-10-CM | POA: Diagnosis not present

## 2015-07-14 ENCOUNTER — Telehealth: Payer: Self-pay | Admitting: *Deleted

## 2015-07-14 ENCOUNTER — Other Ambulatory Visit: Payer: Self-pay | Admitting: *Deleted

## 2015-07-14 DIAGNOSIS — C8230 Follicular lymphoma grade IIIa, unspecified site: Secondary | ICD-10-CM

## 2015-07-14 NOTE — Telephone Encounter (Signed)
Daughter called stating that she is concerned about her potassium, not eating and she has fallen.  She would like a call back [phone].

## 2015-07-14 NOTE — Telephone Encounter (Signed)
Spoke with daughter, [name] to inform her that NP can see her mother tomorrow.  Asked her to call scheduling at the [facility] for the appointment time.  Verbalized understanding.

## 2015-07-15 ENCOUNTER — Inpatient Hospital Stay (HOSPITAL_BASED_OUTPATIENT_CLINIC_OR_DEPARTMENT_OTHER): Payer: Medicare Other | Admitting: Family Medicine

## 2015-07-15 ENCOUNTER — Inpatient Hospital Stay: Payer: Medicare Other

## 2015-07-15 ENCOUNTER — Other Ambulatory Visit: Payer: Self-pay | Admitting: Family Medicine

## 2015-07-15 VITALS — BP 97/58 | HR 69 | Temp 96.8°F | Wt 113.0 lb

## 2015-07-15 DIAGNOSIS — I499 Cardiac arrhythmia, unspecified: Secondary | ICD-10-CM

## 2015-07-15 DIAGNOSIS — Z801 Family history of malignant neoplasm of trachea, bronchus and lung: Secondary | ICD-10-CM

## 2015-07-15 DIAGNOSIS — R634 Abnormal weight loss: Secondary | ICD-10-CM | POA: Diagnosis not present

## 2015-07-15 DIAGNOSIS — Z87891 Personal history of nicotine dependence: Secondary | ICD-10-CM

## 2015-07-15 DIAGNOSIS — J069 Acute upper respiratory infection, unspecified: Secondary | ICD-10-CM

## 2015-07-15 DIAGNOSIS — J90 Pleural effusion, not elsewhere classified: Secondary | ICD-10-CM | POA: Diagnosis not present

## 2015-07-15 DIAGNOSIS — R011 Cardiac murmur, unspecified: Secondary | ICD-10-CM

## 2015-07-15 DIAGNOSIS — C8110 Nodular sclerosis classical Hodgkin lymphoma, unspecified site: Secondary | ICD-10-CM

## 2015-07-15 DIAGNOSIS — Z8052 Family history of malignant neoplasm of bladder: Secondary | ICD-10-CM

## 2015-07-15 DIAGNOSIS — I10 Essential (primary) hypertension: Secondary | ICD-10-CM

## 2015-07-15 DIAGNOSIS — Z800 Family history of malignant neoplasm of digestive organs: Secondary | ICD-10-CM

## 2015-07-15 DIAGNOSIS — Z794 Long term (current) use of insulin: Secondary | ICD-10-CM

## 2015-07-15 DIAGNOSIS — Z79899 Other long term (current) drug therapy: Secondary | ICD-10-CM

## 2015-07-15 DIAGNOSIS — Z9221 Personal history of antineoplastic chemotherapy: Secondary | ICD-10-CM

## 2015-07-15 DIAGNOSIS — E119 Type 2 diabetes mellitus without complications: Secondary | ICD-10-CM

## 2015-07-15 DIAGNOSIS — R109 Unspecified abdominal pain: Secondary | ICD-10-CM

## 2015-07-15 DIAGNOSIS — R630 Anorexia: Secondary | ICD-10-CM

## 2015-07-15 DIAGNOSIS — R002 Palpitations: Secondary | ICD-10-CM

## 2015-07-15 DIAGNOSIS — C8232 Follicular lymphoma grade IIIa, intrathoracic lymph nodes: Secondary | ICD-10-CM

## 2015-07-15 DIAGNOSIS — C8230 Follicular lymphoma grade IIIa, unspecified site: Secondary | ICD-10-CM

## 2015-07-15 DIAGNOSIS — F418 Other specified anxiety disorders: Secondary | ICD-10-CM

## 2015-07-15 DIAGNOSIS — H3530 Unspecified macular degeneration: Secondary | ICD-10-CM

## 2015-07-15 DIAGNOSIS — E7800 Pure hypercholesterolemia, unspecified: Secondary | ICD-10-CM

## 2015-07-15 LAB — COMPREHENSIVE METABOLIC PANEL
ALBUMIN: 2.9 g/dL — AB (ref 3.5–5.0)
ALT: 61 U/L — ABNORMAL HIGH (ref 14–54)
AST: 79 U/L — AB (ref 15–41)
Alkaline Phosphatase: 129 U/L — ABNORMAL HIGH (ref 38–126)
Anion gap: 9 (ref 5–15)
BUN: 16 mg/dL (ref 6–20)
CHLORIDE: 95 mmol/L — AB (ref 101–111)
CO2: 26 mmol/L (ref 22–32)
Calcium: 8.4 mg/dL — ABNORMAL LOW (ref 8.9–10.3)
Creatinine, Ser: 1 mg/dL (ref 0.44–1.00)
GFR calc Af Amer: 54 mL/min — ABNORMAL LOW (ref >60)
GFR calc non Af Amer: 47 mL/min — ABNORMAL LOW (ref >60)
GLUCOSE: 188 mg/dL — AB (ref 65–99)
POTASSIUM: 3.9 mmol/L (ref 3.5–5.1)
SODIUM: 130 mmol/L — AB (ref 135–145)
Total Bilirubin: 0.7 mg/dL (ref 0.3–1.2)
Total Protein: 6.4 g/dL — ABNORMAL LOW (ref 6.5–8.1)

## 2015-07-15 LAB — CBC WITH DIFFERENTIAL/PLATELET
BASOS ABS: 0 10*3/uL (ref 0–0.1)
BASOS PCT: 1 %
Eosinophils Absolute: 0 10*3/uL (ref 0–0.7)
Eosinophils Relative: 1 %
HCT: 35.1 % (ref 35.0–47.0)
Hemoglobin: 11.6 g/dL — ABNORMAL LOW (ref 12.0–16.0)
LYMPHS PCT: 22 %
Lymphs Abs: 0.7 10*3/uL — ABNORMAL LOW (ref 1.0–3.6)
MCH: 28 pg (ref 26.0–34.0)
MCHC: 33.2 g/dL (ref 32.0–36.0)
MCV: 84.3 fL (ref 80.0–100.0)
MONO ABS: 0.4 10*3/uL (ref 0.2–0.9)
Monocytes Relative: 12 %
NEUTROS ABS: 1.9 10*3/uL (ref 1.4–6.5)
Neutrophils Relative %: 64 %
PLATELETS: 258 10*3/uL (ref 150–440)
RBC: 4.16 MIL/uL (ref 3.80–5.20)
RDW: 15.3 % — AB (ref 11.5–14.5)
WBC: 3 10*3/uL — AB (ref 3.6–11.0)

## 2015-07-15 LAB — MAGNESIUM: MAGNESIUM: 1.6 mg/dL — AB (ref 1.7–2.4)

## 2015-07-15 MED ORDER — HEPARIN SOD (PORK) LOCK FLUSH 100 UNIT/ML IV SOLN
500.0000 [IU] | Freq: Once | INTRAVENOUS | Status: AC
  Administered 2015-07-15: 500 [IU] via INTRAVENOUS
  Filled 2015-07-15: qty 5

## 2015-07-15 MED ORDER — SODIUM CHLORIDE 0.9% FLUSH
10.0000 mL | Freq: Once | INTRAVENOUS | Status: AC
  Administered 2015-07-15: 10 mL via INTRAVENOUS
  Filled 2015-07-15: qty 10

## 2015-07-15 MED ORDER — DEXAMETHASONE SODIUM PHOSPHATE 10 MG/ML IJ SOLN: 4.0000 mg | Freq: Once | INTRAMUSCULAR | Status: DC

## 2015-07-15 MED ORDER — SODIUM CHLORIDE 0.9 % IV SOLN
4.0000 mg | Freq: Once | INTRAVENOUS | Status: AC
  Administered 2015-07-15: 4 mg via INTRAVENOUS
  Filled 2015-07-15: qty 0.4

## 2015-07-15 MED ORDER — SODIUM CHLORIDE 0.9 % IV SOLN
Freq: Once | INTRAVENOUS | Status: AC
  Administered 2015-07-15: 15:00:00 via INTRAVENOUS
  Filled 2015-07-15: qty 1000

## 2015-07-15 MED ORDER — KETOROLAC TROMETHAMINE 15 MG/ML IJ SOLN
15.0000 mg | Freq: Once | INTRAMUSCULAR | Status: AC
  Administered 2015-07-15: 15 mg via INTRAVENOUS
  Filled 2015-07-15: qty 1

## 2015-07-15 MED ORDER — MIRTAZAPINE 15 MG PO TABS: 15.0000 mg | ORAL_TABLET | Freq: Every day | ORAL | Status: DC

## 2015-07-15 NOTE — Progress Notes (Signed)
Patient brought to exam room 18 via wheelchair, accompanied by her sitter.  Patient c/o of lower right abdominal pain, 6 oof 10 after plurex cath was drained.  Medication record updated, information provided by patient and sitter.  Dr. Ree Kida

## 2015-07-15 NOTE — Progress Notes (Signed)
[place]  Telephone:(336) [phone]  Fax:(336) [phone]     Caroline Russo DOB: 01/14/1920  MR#: [id number]  XVQ#:[id number]  Patient Care Team: Arlis Porta., MD as PCP - General (Unknown Physician Specialty) Minna Merritts, MD as Consulting Physician (Cardiology)  CHIEF COMPLAINT:  Chief Complaint  Patient presents with  . Follow-up    INTERVAL HISTORY: Patient is being seen as an acute add on today regarding pain in the abdomen following Pleurx catheter drainage this morning.  She was evaluated in this office approximately 1 week ago and was instructed to take Mucinex and Tessalon Perles, patient was also started on Keflex. Patient continues to have issues with cough but reports that congestion has improved some. She is having more difficulty sleeping at night and also daughter reports appetite has gone downhill.  REVIEW OF SYSTEMS:   Review of Systems  Constitutional: Positive for malaise/fatigue. Negative for fever, chills, weight loss and diaphoresis.       Poor appetite  HENT: Positive for congestion.   Eyes: Negative.   Respiratory: Positive for cough. Negative for hemoptysis, sputum production, shortness of breath and wheezing.   Cardiovascular: Negative for chest pain, palpitations, orthopnea, claudication, leg swelling and PND.  Gastrointestinal: Negative for heartburn, nausea, vomiting, abdominal pain, diarrhea, constipation, blood in stool and melena.  Genitourinary: Negative.   Musculoskeletal: Negative.   Skin: Negative.   Neurological: Negative for dizziness, tingling, focal weakness, seizures and weakness.  Endo/Heme/Allergies: Does not bruise/bleed easily.  Psychiatric/Behavioral: Negative for depression. The patient is not nervous/anxious and does not have insomnia.     As per HPI. Otherwise, a complete review of systems is negatve.  ONCOLOGY HISTORY: Oncology History   1. Biopsy of para-aortic lymph node is consistent with  follicular lymphoma, clinically stage as 3A Diagnosis in July of 2015. Started on rituximab on July 31, 2013 2. Started on [place]  and Rituxan on November 04, 2013 3 maintenance rituximab started from April of 2016      Lymphoma [facility])   12/12/2013 Initial Diagnosis Lymphoma    PAST MEDICAL HISTORY: Past Medical History  Diagnosis Date  . HTN (hypertension)   . DM2 (diabetes mellitus, type 2) (HCC)     insulin requiring  . Hypercholesterolemia   . Murmur     hx  . Tinnitus     chronic  . Macular degeneration   . Chronic chest pain     a. nonobs cath 2002.  Marland Kitchen Palpitations     a. 01/2011 Holter monitor showed frequent PACs, sinus bradycardia  . Anxiety   . Depression   . Irregular cardiac rhythm   . Heart murmur     a. 09/2014 Echo: EF 55-60%, nl wm, mod MS, mild MR, mod dil LA, mild-mod TR.  . Cataract   . Non Hodgkin's lymphoma ([place])   . Hypomagnesemia 06/13/2014  . Non-obstructive CAD   . Orthostatic hypotension     PAST SURGICAL HISTORY: Past Surgical History  Procedure Laterality Date  . Cardiac catheterization  2002  . Tumor removal  2012    benign neck tumor removed  . Tooth extraction    . Knee surgery Left   . Ankle surgery Left   . Partial hysterectomy    . Insertion / placement pleural catheter      FAMILY HISTORY Family History  Problem Relation Age of Onset  . Heart disease Mother   . Lung cancer Brother     smoked  . Bladder Cancer  Brother   . Stomach cancer Brother     GYNECOLOGIC HISTORY:  No LMP recorded. Patient has had a hysterectomy.     ADVANCED DIRECTIVES:    HEALTH MAINTENANCE: Social History  Substance Use Topics  . Smoking status: Former Smoker -- 0.25 packs/day for 7 years    Types: Cigarettes    Quit date: 01/25/1988  . Smokeless tobacco: Never Used  . Alcohol Use: No     Allergies  Allergen Reactions  . Moxifloxacin Shortness Of Breath    hallucinations  . Codeine Other (See Comments)    unknown  . Penicillins      Itching  Rash   . Prednisone     Hallucinations  . Unasyn [Ampicillin-Sulbactam Sodium] Other (See Comments)    unknown    Current Outpatient Prescriptions  Medication Sig Dispense Refill  . ALPRAZolam (XANAX) 0.25 MG tablet Take 1 tablet (0.25 mg total) by mouth every 6 (six) hours as needed. 30 tablet 1  . benzonatate (TESSALON) 100 MG capsule Take 1 capsule (100 mg total) by mouth 3 (three) times daily as needed for cough. 30 capsule 0  . fluticasone (FLONASE) 50 MCG/ACT nasal spray Place 1 spray into both nostrils daily. 16 g 6  . lidocaine-prilocaine (EMLA) cream Apply 1 application topically as needed. 30 g 3  . losartan (COZAAR) 50 MG tablet TAKE 1 TABLET BY MOUTH DAILY AFTER BREAKFAST. 90 tablet 3  . meclizine (ANTIVERT) 12.5 MG tablet Take 12.5 mg by mouth 3 (three) times daily as needed.    . Multiple Vitamin (MULTIVITAMIN WITH MINERALS) TABS tablet Take 1 tablet by mouth daily.    . pantoprazole (PROTONIX) 40 MG tablet Take 1 tablet (40 mg total) by mouth daily. 30 tablet 6  . polyethylene glycol powder (GLYCOLAX/MIRALAX) powder Take 17 g by mouth daily as needed. 3350 g 0  . SLOW-MAG 71.5-119 MG TBEC SR tablet TAKE 1 TABLET (64 MG TOTAL) BY MOUTH 2 (TWO) TIMES DAILY. 60 tablet 3  . traMADol (ULTRAM) 50 MG tablet Take 1 tablet by mouth every 4-6 hours as needed for pain 90 tablet 0  . cyclobenzaprine (FLEXERIL) 10 MG tablet Take 10 mg by mouth 2 (two) times daily as needed for muscle spasms. Reported on 07/15/2015    . DOK 100 MG capsule TAKE 1 CAPSULE(100 MG) BY MOUTH TWICE DAILY 60 capsule 0  . nitroGLYCERIN (NITROSTAT) 0.4 MG SL tablet Place 0.4 mg under the tongue every 5 (five) minutes as needed. Reported on 07/15/2015     Current Facility-Administered Medications  Medication Dose Route Frequency Provider Last Rate Last Dose  . 0.9 %  sodium chloride infusion   Intravenous Once Evlyn Kanner, NP      . dexamethasone (DECADRON) injection 4 mg  4 mg Intravenous Once  Evlyn Kanner, NP      . ketorolac (TORADOL) 15 MG/ML injection 15 mg  15 mg Intravenous Once Evlyn Kanner, NP       Facility-Administered Medications Ordered in Other Visits  Medication Dose Route Frequency Provider Last Rate Last Dose  . heparin lock flush 100 unit/mL  500 Units Intravenous Once Forest Gleason, MD   500 Units at 07/24/14 1630  . sodium chloride 0.9 % injection 10 mL  10 mL Intravenous PRN Forest Gleason, MD   10 mL at 07/24/14 1020  . sodium chloride 0.9 % injection 10 mL  10 mL Intracatheter PRN Forest Gleason, MD   10 mL at 07/24/14 1020  . sodium  chloride 0.9 % injection 10 mL  10 mL Intravenous PRN Forest Gleason, MD   10 mL at 08/12/14 0834    OBJECTIVE: BP 97/58 mmHg  Pulse 69  Temp(Src) 96.8 F (36 C) (Tympanic)  Wt 113 lb (51.256 kg)   Body mass index is 20.66 kg/(m^2).    ECOG FS:3 - Symptomatic, >50% confined to bed  General: Well-developed, no acute distress. Sitting in wheelchair Eyes: Pink conjunctiva, anicteric sclera. HEENT: Normocephalic, moist mucous membranes, clear oropharnyx. Lungs: Diminished throughout, otherwise clear to auscultation.  Heart: Regular rate and rhythm. No rubs, murmurs, or gallops. Musculoskeletal: No edema, cyanosis, or clubbing. Neuro: Alert, answering all questions appropriately. Cranial nerves grossly intact. Skin: No rashes or petechiae noted. Psych: Normal affect.   LAB RESULTS:  Appointment on 07/15/2015  Component Date Value Ref Range Status  . WBC 07/15/2015 3.0* 3.6 - 11.0 K/uL Final  . RBC 07/15/2015 4.16  3.80 - 5.20 MIL/uL Final  . Hemoglobin 07/15/2015 11.6* 12.0 - 16.0 g/dL Final  . HCT 07/15/2015 35.1  35.0 - 47.0 % Final  . MCV 07/15/2015 84.3  80.0 - 100.0 fL Final  . MCH 07/15/2015 28.0  26.0 - 34.0 pg Final  . MCHC 07/15/2015 33.2  32.0 - 36.0 g/dL Final  . RDW 07/15/2015 15.3* 11.5 - 14.5 % Final  . Platelets 07/15/2015 258  150 - 440 K/uL Final  . Neutrophils Relative % 07/15/2015 64   Final  .  Neutro Abs 07/15/2015 1.9  1.4 - 6.5 K/uL Final  . Lymphocytes Relative 07/15/2015 22   Final  . Lymphs Abs 07/15/2015 0.7* 1.0 - 3.6 K/uL Final  . Monocytes Relative 07/15/2015 12   Final  . Monocytes Absolute 07/15/2015 0.4  0.2 - 0.9 K/uL Final  . Eosinophils Relative 07/15/2015 1   Final  . Eosinophils Absolute 07/15/2015 0.0  0 - 0.7 K/uL Final  . Basophils Relative 07/15/2015 1   Final  . Basophils Absolute 07/15/2015 0.0  0 - 0.1 K/uL Final  . Sodium 07/15/2015 130* 135 - 145 mmol/L Final  . Potassium 07/15/2015 3.9  3.5 - 5.1 mmol/L Final  . Chloride 07/15/2015 95* 101 - 111 mmol/L Final  . CO2 07/15/2015 26  22 - 32 mmol/L Final  . Glucose, Bld 07/15/2015 188* 65 - 99 mg/dL Final  . BUN 07/15/2015 16  6 - 20 mg/dL Final  . Creatinine, Ser 07/15/2015 1.00  0.44 - 1.00 mg/dL Final  . Calcium 07/15/2015 8.4* 8.9 - 10.3 mg/dL Final  . Total Protein 07/15/2015 6.4* 6.5 - 8.1 g/dL Final  . Albumin 07/15/2015 2.9* 3.5 - 5.0 g/dL Final  . AST 07/15/2015 79* 15 - 41 U/L Final  . ALT 07/15/2015 61* 14 - 54 U/L Final  . Alkaline Phosphatase 07/15/2015 129* 38 - 126 U/L Final  . Total Bilirubin 07/15/2015 0.7  0.3 - 1.2 mg/dL Final  . GFR calc non Af Amer 07/15/2015 47* >60 mL/min Final  . GFR calc Af Amer 07/15/2015 54* >60 mL/min Final   Comment: (NOTE) The eGFR has been calculated using the CKD EPI equation. This calculation has not been validated in all clinical situations. eGFR's persistently <60 mL/min signify possible Chronic Kidney Disease.   . Anion gap 07/15/2015 9  5 - 15 Final  . Magnesium 07/15/2015 1.6* 1.7 - 2.4 mg/dL Final    STUDIES: No results found.  ASSESSMENT:  Abdominal pain.  PLAN:  1. Abdominal pain. Patient reports new onset of abdominal pain in the right  lower quadrant following Pleurx catheter drainage this morning. She reports draining 425 mL off patient's abdomen this morning and pain started immediately after. Most likely pain is related to  withdrawal of fluid and then internal Pleurx drainage tube rubbing or hitting abdominal wall. Advised patient that we would give Toradol 15 mg IV today in clinic. 2. Decreased appetite. Discussed with patient and daughter as well as caregiver who is in the room that this could be a natural decline in appetite due to patient's age as well as illness. Advised that we would give half of a liter of normal saline today as well as a very small dose of Decadron. As patient is unable to sleep well at night also instructed daughter that we would trial Remeron to see if this improved her restfulness as well as her appetite. Daughter states that she believes her mother took this and it improved her appetite.  Discussed with family and patient options of hospice services. They are going to consider these options and discuss when they see Dr. Rogue Bussing in approximately 1 week.   Patient expressed understanding and was in agreement with this plan. She also understands that She can call clinic at any time with any questions, concerns, or complaints.   Dr. Rogue Bussing was available for consultation and review of plan of care for this patient.   Evlyn Kanner, NP   07/15/2015 2:23 PM

## 2015-07-19 ENCOUNTER — Inpatient Hospital Stay
Admission: EM | Admit: 2015-07-19 | Discharge: 2015-07-22 | DRG: 193 | Disposition: A | Discharge status: Home / Self Care | Payer: Medicare Other | Attending: Internal Medicine | Admitting: Internal Medicine

## 2015-07-19 ENCOUNTER — Other Ambulatory Visit: Payer: Self-pay

## 2015-07-19 ENCOUNTER — Telehealth: Payer: Self-pay | Admitting: Hematology and Oncology

## 2015-07-19 ENCOUNTER — Encounter: Payer: Self-pay | Admitting: Emergency Medicine

## 2015-07-19 ENCOUNTER — Other Ambulatory Visit: Payer: Self-pay | Admitting: Hematology and Oncology

## 2015-07-19 ENCOUNTER — Emergency Department: Payer: Medicare Other

## 2015-07-19 DIAGNOSIS — C8110 Nodular sclerosis classical Hodgkin lymphoma, unspecified site: Secondary | ICD-10-CM | POA: Diagnosis not present

## 2015-07-19 DIAGNOSIS — Z800 Family history of malignant neoplasm of digestive organs: Secondary | ICD-10-CM | POA: Diagnosis not present

## 2015-07-19 DIAGNOSIS — E876 Hypokalemia: Secondary | ICD-10-CM | POA: Diagnosis present

## 2015-07-19 DIAGNOSIS — J189 Pneumonia, unspecified organism: Secondary | ICD-10-CM | POA: Diagnosis present

## 2015-07-19 DIAGNOSIS — I110 Hypertensive heart disease with heart failure: Secondary | ICD-10-CM | POA: Diagnosis present

## 2015-07-19 DIAGNOSIS — R509 Fever, unspecified: Secondary | ICD-10-CM | POA: Diagnosis not present

## 2015-07-19 DIAGNOSIS — Z886 Allergy status to analgesic agent status: Secondary | ICD-10-CM | POA: Diagnosis not present

## 2015-07-19 DIAGNOSIS — E43 Unspecified severe protein-calorie malnutrition: Secondary | ICD-10-CM | POA: Diagnosis present

## 2015-07-19 DIAGNOSIS — R531 Weakness: Secondary | ICD-10-CM | POA: Diagnosis not present

## 2015-07-19 DIAGNOSIS — R627 Adult failure to thrive: Secondary | ICD-10-CM | POA: Diagnosis present

## 2015-07-19 DIAGNOSIS — Z888 Allergy status to other drugs, medicaments and biological substances status: Secondary | ICD-10-CM

## 2015-07-19 DIAGNOSIS — C8190 Hodgkin lymphoma, unspecified, unspecified site: Secondary | ICD-10-CM | POA: Diagnosis present

## 2015-07-19 DIAGNOSIS — Z7401 Bed confinement status: Secondary | ICD-10-CM

## 2015-07-19 DIAGNOSIS — J90 Pleural effusion, not elsewhere classified: Secondary | ICD-10-CM | POA: Insufficient documentation

## 2015-07-19 DIAGNOSIS — E119 Type 2 diabetes mellitus without complications: Secondary | ICD-10-CM | POA: Diagnosis present

## 2015-07-19 DIAGNOSIS — E871 Hypo-osmolality and hyponatremia: Secondary | ICD-10-CM | POA: Diagnosis present

## 2015-07-19 DIAGNOSIS — Z90711 Acquired absence of uterus with remaining cervical stump: Secondary | ICD-10-CM | POA: Diagnosis not present

## 2015-07-19 DIAGNOSIS — Z8249 Family history of ischemic heart disease and other diseases of the circulatory system: Secondary | ICD-10-CM | POA: Diagnosis not present

## 2015-07-19 DIAGNOSIS — Z87891 Personal history of nicotine dependence: Secondary | ICD-10-CM

## 2015-07-19 DIAGNOSIS — I2510 Atherosclerotic heart disease of native coronary artery without angina pectoris: Secondary | ICD-10-CM | POA: Diagnosis present

## 2015-07-19 DIAGNOSIS — E860 Dehydration: Secondary | ICD-10-CM | POA: Diagnosis not present

## 2015-07-19 DIAGNOSIS — Z8052 Family history of malignant neoplasm of bladder: Secondary | ICD-10-CM

## 2015-07-19 DIAGNOSIS — D72819 Decreased white blood cell count, unspecified: Secondary | ICD-10-CM | POA: Diagnosis present

## 2015-07-19 DIAGNOSIS — Z9889 Other specified postprocedural states: Secondary | ICD-10-CM

## 2015-07-19 DIAGNOSIS — H269 Unspecified cataract: Secondary | ICD-10-CM | POA: Diagnosis present

## 2015-07-19 DIAGNOSIS — Z66 Do not resuscitate: Secondary | ICD-10-CM | POA: Diagnosis present

## 2015-07-19 DIAGNOSIS — Z6821 Body mass index (BMI) 21.0-21.9, adult: Secondary | ICD-10-CM

## 2015-07-19 DIAGNOSIS — I10 Essential (primary) hypertension: Secondary | ICD-10-CM | POA: Diagnosis not present

## 2015-07-19 DIAGNOSIS — Z794 Long term (current) use of insulin: Secondary | ICD-10-CM | POA: Diagnosis not present

## 2015-07-19 DIAGNOSIS — Z801 Family history of malignant neoplasm of trachea, bronchus and lung: Secondary | ICD-10-CM

## 2015-07-19 DIAGNOSIS — C8290 Follicular lymphoma, unspecified, unspecified site: Secondary | ICD-10-CM | POA: Diagnosis present

## 2015-07-19 DIAGNOSIS — R05 Cough: Secondary | ICD-10-CM | POA: Diagnosis not present

## 2015-07-19 DIAGNOSIS — H3530 Unspecified macular degeneration: Secondary | ICD-10-CM | POA: Diagnosis present

## 2015-07-19 DIAGNOSIS — J9811 Atelectasis: Secondary | ICD-10-CM | POA: Diagnosis present

## 2015-07-19 DIAGNOSIS — K5909 Other constipation: Secondary | ICD-10-CM | POA: Diagnosis present

## 2015-07-19 DIAGNOSIS — Z79899 Other long term (current) drug therapy: Secondary | ICD-10-CM

## 2015-07-19 DIAGNOSIS — I5032 Chronic diastolic (congestive) heart failure: Secondary | ICD-10-CM | POA: Diagnosis present

## 2015-07-19 DIAGNOSIS — Z7951 Long term (current) use of inhaled steroids: Secondary | ICD-10-CM

## 2015-07-19 DIAGNOSIS — R059 Cough, unspecified: Secondary | ICD-10-CM | POA: Insufficient documentation

## 2015-07-19 DIAGNOSIS — C8590 Non-Hodgkin lymphoma, unspecified, unspecified site: Secondary | ICD-10-CM | POA: Diagnosis not present

## 2015-07-19 DIAGNOSIS — Z880 Allergy status to penicillin: Secondary | ICD-10-CM

## 2015-07-19 LAB — URINALYSIS COMPLETE WITH MICROSCOPIC (ARMC ONLY)
BACTERIA UA: NONE SEEN
Bilirubin Urine: NEGATIVE
Glucose, UA: NEGATIVE mg/dL
HGB URINE DIPSTICK: NEGATIVE
Ketones, ur: NEGATIVE mg/dL
Leukocytes, UA: NEGATIVE
NITRITE: NEGATIVE
PROTEIN: NEGATIVE mg/dL
SPECIFIC GRAVITY, URINE: 1.013 (ref 1.005–1.030)
pH: 6 (ref 5.0–8.0)

## 2015-07-19 LAB — COMPREHENSIVE METABOLIC PANEL
ALT: 28 U/L (ref 14–54)
ANION GAP: 9 (ref 5–15)
AST: 31 U/L (ref 15–41)
Albumin: 2.5 g/dL — ABNORMAL LOW (ref 3.5–5.0)
Alkaline Phosphatase: 100 U/L (ref 38–126)
BUN: 12 mg/dL (ref 6–20)
CHLORIDE: 94 mmol/L — AB (ref 101–111)
CO2: 27 mmol/L (ref 22–32)
Calcium: 8.2 mg/dL — ABNORMAL LOW (ref 8.9–10.3)
Creatinine, Ser: 0.83 mg/dL (ref 0.44–1.00)
GFR, EST NON AFRICAN AMERICAN: 59 mL/min — AB (ref >60)
Glucose, Bld: 161 mg/dL — ABNORMAL HIGH (ref 65–99)
POTASSIUM: 3.9 mmol/L (ref 3.5–5.1)
SODIUM: 130 mmol/L — AB (ref 135–145)
Total Bilirubin: 0.7 mg/dL (ref 0.3–1.2)
Total Protein: 5.9 g/dL — ABNORMAL LOW (ref 6.5–8.1)

## 2015-07-19 LAB — TSH: TSH: 0.747 u[IU]/mL (ref 0.350–4.500)

## 2015-07-19 LAB — CBC WITH DIFFERENTIAL/PLATELET
BASOS ABS: 0 10*3/uL (ref 0–0.1)
BLASTS: 0 %
Band Neutrophils: 0 %
Basophils Relative: 0 %
EOS PCT: 2 %
Eosinophils Absolute: 0 10*3/uL (ref 0–0.7)
HEMATOCRIT: 33.2 % — AB (ref 35.0–47.0)
HEMOGLOBIN: 11.6 g/dL — AB (ref 12.0–16.0)
LYMPHS PCT: 45 %
Lymphs Abs: 1.1 10*3/uL (ref 1.0–3.6)
MCH: 29.3 pg (ref 26.0–34.0)
MCHC: 35 g/dL (ref 32.0–36.0)
MCV: 83.5 fL (ref 80.0–100.0)
METAMYELOCYTES PCT: 2 %
MONOS PCT: 15 %
Monocytes Absolute: 0.3 10*3/uL (ref 0.2–0.9)
Myelocytes: 1 %
NEUTROS ABS: 0.8 10*3/uL — AB (ref 1.4–6.5)
NEUTROS PCT: 35 %
NRBC: 0 /100{WBCs}
OTHER: 0 %
Platelets: 255 10*3/uL (ref 150–440)
Promyelocytes Absolute: 0 %
RBC: 3.98 MIL/uL (ref 3.80–5.20)
RDW: 15.3 % — ABNORMAL HIGH (ref 11.5–14.5)
WBC: 2.2 10*3/uL — AB (ref 3.6–11.0)

## 2015-07-19 LAB — LACTIC ACID, PLASMA
LACTIC ACID, VENOUS: 1.3 mmol/L (ref 0.5–2.0)
LACTIC ACID, VENOUS: 1.3 mmol/L (ref 0.5–2.0)

## 2015-07-19 LAB — TROPONIN I

## 2015-07-19 MED ORDER — SENNOSIDES-DOCUSATE SODIUM 8.6-50 MG PO TABS: 1.0000 | ORAL_TABLET | Freq: Every evening | ORAL | Status: DC | PRN

## 2015-07-19 MED ORDER — SODIUM CHLORIDE 0.9 % IV SOLN
INTRAVENOUS | Status: DC
  Administered 2015-07-19 – 2015-07-20 (×2): via INTRAVENOUS

## 2015-07-19 MED ORDER — DEXTROSE 5 % IV SOLN
500.0000 mg | INTRAVENOUS | Status: DC
  Administered 2015-07-19 – 2015-07-20 (×2): 500 mg via INTRAVENOUS
  Filled 2015-07-19 (×3): qty 500

## 2015-07-19 MED ORDER — ACETAMINOPHEN 325 MG PO TABS
650.0000 mg | ORAL_TABLET | Freq: Four times a day (QID) | ORAL | Status: DC | PRN
  Administered 2015-07-21: 650 mg via ORAL
  Filled 2015-07-19: qty 2

## 2015-07-19 MED ORDER — SODIUM CHLORIDE 0.9 % IV BOLUS (SEPSIS)
1000.0000 mL | Freq: Once | INTRAVENOUS | Status: AC
  Administered 2015-07-19: 1000 mL via INTRAVENOUS

## 2015-07-19 MED ORDER — ONDANSETRON HCL 4 MG/2ML IJ SOLN: 4.0000 mg | Freq: Four times a day (QID) | INTRAMUSCULAR | Status: DC | PRN

## 2015-07-19 MED ORDER — DEXTROSE 5 % IV SOLN
2.0000 g | Freq: Once | INTRAVENOUS | Status: AC
  Administered 2015-07-19: 2 g via INTRAVENOUS
  Filled 2015-07-19: qty 2

## 2015-07-19 MED ORDER — ENOXAPARIN SODIUM 40 MG/0.4ML ~~LOC~~ SOLN
40.0000 mg | SUBCUTANEOUS | Status: DC
  Administered 2015-07-19 – 2015-07-21 (×3): 40 mg via SUBCUTANEOUS
  Filled 2015-07-19 (×3): qty 0.4

## 2015-07-19 MED ORDER — VANCOMYCIN HCL IN DEXTROSE 1-5 GM/200ML-% IV SOLN
1000.0000 mg | Freq: Once | INTRAVENOUS | Status: AC
  Administered 2015-07-19: 1000 mg via INTRAVENOUS
  Filled 2015-07-19: qty 200

## 2015-07-19 MED ORDER — DEXTROSE 5 % IV SOLN
1.0000 g | INTRAVENOUS | Status: DC
  Administered 2015-07-19 – 2015-07-21 (×3): 1 g via INTRAVENOUS
  Filled 2015-07-19 (×4): qty 10

## 2015-07-19 MED ORDER — SODIUM CHLORIDE 0.9 % IV BOLUS (SEPSIS)
500.0000 mL | Freq: Once | INTRAVENOUS | Status: AC
  Administered 2015-07-19: 500 mL via INTRAVENOUS

## 2015-07-19 MED ORDER — ACETAMINOPHEN 650 MG RE SUPP: 650.0000 mg | Freq: Four times a day (QID) | RECTAL | Status: DC | PRN

## 2015-07-19 MED ORDER — ONDANSETRON HCL 4 MG PO TABS: 4.0000 mg | ORAL_TABLET | Freq: Four times a day (QID) | ORAL | Status: DC | PRN

## 2015-07-19 MED ORDER — SODIUM CHLORIDE 0.9 % IV BOLUS (SEPSIS)
250.0000 mL | Freq: Once | INTRAVENOUS | Status: AC
  Administered 2015-07-19: 250 mL via INTRAVENOUS

## 2015-07-19 NOTE — ED Notes (Signed)
Pt c/o decreased appetite, dnies n/v/d

## 2015-07-19 NOTE — ED Notes (Addendum)
Patient to ER for c/o increased weakness x1 week. States she is not currently undergoing any kind of treatment for her cancer (chemo, etc). Daughter reports mild disorientation for last couple of days.

## 2015-07-19 NOTE — H&P (Signed)
[employer] [facility] [place] at [facility]   PATIENT NAME: Caroline Russo    MR#:  [id number]  DATE OF BIRTH:  01-08-1920  DATE OF ADMISSION:  07/19/2015  PRIMARY CARE PHYSICIAN: Fidel Levy, MD   REQUESTING/REFERRING PHYSICIAN: Dr Derrill Kay  CHIEF COMPLAINT:   Cough and weakness  HISTORY OF PRESENT ILLNESS:  Caroline Russo  is a 82 y.o. female with a known history of Nodular sclerosing Hodgkin's lymphoma currently not on chemotherapy who presents with 5 days of left eye G. She was evaluated on June 13 by Kathlene Cote for URI symptoms. She is prescribed Keflex for cough. Chest x-ray at that time showed a lateral pleural effusions and basal atelectasis. Patient reports she continues to have decreased oral intake, lethargy and weakness. She had a temperature of 100.1 today. In the emergency room chest x-ray shows possible mild pulmonary edema and bilateral atelectasis. She was initially treated on broad-spectrum antibiotics for code sepsis.  PAST MEDICAL HISTORY:   Past Medical History  Diagnosis Date  . HTN (hypertension)   . DM2 (diabetes mellitus, type 2) (HCC)     insulin requiring  . Hypercholesterolemia   . Murmur     hx  . Tinnitus     chronic  . Macular degeneration   . Chronic chest pain     a. nonobs cath 2002.  Marland Kitchen Palpitations     a. 01/2011 Holter monitor showed frequent PACs, sinus bradycardia  . Anxiety   . Depression   . Irregular cardiac rhythm   . Heart murmur     a. 09/2014 Echo: EF 55-60%, nl wm, mod MS, mild MR, mod dil LA, mild-mod TR.  . Cataract   . Non Hodgkin's lymphoma (HCC)   . Hypomagnesemia 06/13/2014  . Non-obstructive CAD   . Orthostatic hypotension     PAST SURGICAL HISTORY:   Past Surgical History  Procedure Laterality Date  . Cardiac catheterization  2002  . Tumor removal  2012    benign neck tumor removed  . Tooth extraction    . Knee surgery Left   . Ankle surgery Left   . Partial hysterectomy    . Insertion /  placement pleural catheter      SOCIAL HISTORY:   Social History  Substance Use Topics  . Smoking status: Former Smoker -- 0.25 packs/day for 7 years    Types: Cigarettes    Quit date: 01/25/1988  . Smokeless tobacco: Never Used  . Alcohol Use: No    FAMILY HISTORY:   Family History  Problem Relation Age of Onset  . Heart disease Mother   . Lung cancer Brother     smoked  . Bladder Cancer Brother   . Stomach cancer Brother     DRUG ALLERGIES:   Allergies  Allergen Reactions  . Moxifloxacin Shortness Of Breath    hallucinations  . Codeine Other (See Comments)    unknown  . Penicillins     Itching  Rash   . Prednisone     Hallucinations  . Unasyn [Ampicillin-Sulbactam Sodium] Other (See Comments)    unknown    REVIEW OF SYSTEMS:   Review of Systems  Constitutional: Positive for fever and malaise/fatigue. Negative for chills.  HENT: Negative for ear discharge, ear pain, hearing loss, nosebleeds and sore throat.   Eyes: Negative for blurred vision and pain.  Respiratory: Positive for cough, sputum production and shortness of breath. Negative for hemoptysis and wheezing.   Cardiovascular: Negative for chest pain, palpitations  and leg swelling.  Gastrointestinal: Negative for nausea, vomiting, abdominal pain, diarrhea and blood in stool.  Genitourinary: Negative for dysuria.  Musculoskeletal: Negative for back pain.  Neurological: Positive for weakness. Negative for dizziness, tremors, speech change, focal weakness, seizures and headaches.  Endo/Heme/Allergies: Does not bruise/bleed easily.  Psychiatric/Behavioral: Negative for depression, suicidal ideas and hallucinations.    MEDICATIONS AT HOME:   Prior to Admission medications   Medication Sig Start Date End Date Taking? Authorizing Provider  ALPRAZolam (XANAX) 0.25 MG tablet Take 1 tablet (0.25 mg total) by mouth every 6 (six) hours as needed. 11/05/14   Johney Maine, MD  benzonatate (TESSALON) 100 MG  capsule Take 1 capsule (100 mg total) by mouth 3 (three) times daily as needed for cough. 07/07/15   Loann Quill, NP  cyclobenzaprine (FLEXERIL) 10 MG tablet Take 10 mg by mouth 2 (two) times daily as needed for muscle spasms. Reported on 07/15/2015    Historical Provider, MD  DOK 100 MG capsule TAKE 1 CAPSULE(100 MG) BY MOUTH TWICE DAILY 07/03/15   Loann Quill, NP  fluticasone (FLONASE) 50 MCG/ACT nasal spray Place 1 spray into both nostrils daily. 05/07/15   Janeann Forehand., MD  lidocaine-prilocaine (EMLA) cream Apply 1 application topically as needed. 07/18/14   Johney Maine, MD  losartan (COZAAR) 50 MG tablet TAKE 1 TABLET BY MOUTH DAILY AFTER BREAKFAST. 05/04/15   Antonieta Iba, MD  meclizine (ANTIVERT) 12.5 MG tablet Take 12.5 mg by mouth 3 (three) times daily as needed.    Historical Provider, MD  mirtazapine (REMERON) 15 MG tablet TAKE 1 TABLET(15 MG) BY MOUTH AT BEDTIME 07/15/15   Loann Quill, NP  Multiple Vitamin (MULTIVITAMIN WITH MINERALS) TABS tablet Take 1 tablet by mouth daily.    Historical Provider, MD  nitroGLYCERIN (NITROSTAT) 0.4 MG SL tablet Place 0.4 mg under the tongue every 5 (five) minutes as needed. Reported on 07/15/2015    Historical Provider, MD  pantoprazole (PROTONIX) 40 MG tablet Take 1 tablet (40 mg total) by mouth daily. 06/17/15   Johney Maine, MD  polyethylene glycol powder (GLYCOLAX/MIRALAX) powder Take 17 g by mouth daily as needed. 02/17/15   Johney Maine, MD  SLOW-MAG 71.5-119 MG TBEC SR tablet TAKE 1 TABLET (64 MG TOTAL) BY MOUTH 2 (TWO) TIMES DAILY. 01/02/15   Johney Maine, MD  traMADol (ULTRAM) 50 MG tablet Take 1 tablet by mouth every 4-6 hours as needed for pain 06/17/15   Johney Maine, MD      VITAL SIGNS:  Blood pressure 137/59, pulse 90, temperature 100.1 F (37.8 C), temperature source Oral, resp. rate 19, height 5\' 2"  (1.575 m), weight 51.256 kg (113 lb), SpO2 97 %.  PHYSICAL EXAMINATION:   Physical Exam  Constitutional: She is  oriented to person, place, and time and well-developed, well-nourished, and in no distress. No distress.  Frail ill appearing  HENT:  Head: Normocephalic.  Eyes: No scleral icterus.  Neck: Normal range of motion. Neck supple. No JVD present. No tracheal deviation present.  Cardiovascular: Normal rate and regular rhythm.  Exam reveals no gallop and no friction rub.   Murmur heard. Pulmonary/Chest: Effort normal and breath sounds normal. No respiratory distress. She has no wheezes. She has no rales. She exhibits no tenderness.  Decreased throughout no crackles, rales wheezing  Abdominal: Soft. Bowel sounds are normal. She exhibits no distension and no mass. There is no tenderness. There is no rebound and no guarding.  Musculoskeletal: Normal range of motion.  She exhibits no edema.  Neurological: She is alert and oriented to person, place, and time.  Skin: Skin is warm. No rash noted. No erythema.  Psychiatric: Affect and judgment normal.      LABORATORY PANEL:   CBC  Recent Labs Lab 07/19/15 1400  WBC 2.2*  HGB 11.6*  HCT 33.2*  PLT 255   ------------------------------------------------------------------------------------------------------------------  Chemistries   Recent Labs Lab 07/15/15 1300 07/19/15 1400  NA 130* 130*  K 3.9 3.9  CL 95* 94*  CO2 26 27  GLUCOSE 188* 161*  BUN 16 12  CREATININE 1.00 0.83  CALCIUM 8.4* 8.2*  MG 1.6*  --   AST 79* 31  ALT 61* 28  ALKPHOS 129* 100  BILITOT 0.7 0.7   ------------------------------------------------------------------------------------------------------------------  Cardiac Enzymes  Recent Labs Lab 07/19/15 1400  TROPONINI <0.03   ------------------------------------------------------------------------------------------------------------------  RADIOLOGY:  Dg Chest Port 1 View  07/19/2015  CLINICAL DATA:  Increased weakness for 1 week. Non-Hodgkin's lymphoma. Mild disorientation. EXAM: PORTABLE CHEST 1  VIEW COMPARISON:  Chest x-rays dated 07/06/2015 and 02/10/2014. FINDINGS: Mild cardiomegaly is stable. Atherosclerotic calcifications noted at the aortic arch. There is mild central pulmonary vascular congestion. Hazy opacities at each lung base are likely layering pleural effusions with possible associated atelectasis. Upper lungs are relatively clear. Right chest wall Port-A-Cath is stable in position with tip overlying the mid SVC. IMPRESSION: 1. Mild cardiomegaly with central pulmonary vascular congestion suggesting mild CHF/volume overload. No overt alveolar pulmonary edema. 2. Hazy opacities at each lung base, likely small layering pleural effusions. Suspect associated atelectasis at each lung base. Electronically Signed   By: Bary Richard M.D.   On: 07/19/2015 14:21    EKG:  Sinus rhythm THERE IS NO ST elevation   IMPRESSION AND PLAN:   82 year old female with Hodgkin's lymphoma who presents with generalized weakness and persistent cough.  1. Hyponatremia: Patient has baseline sodium 133-135. She may be slightly dehydrated. Start low dose IV fluids and repeat BMP in a.m. Check TSH.  2. Generalized weakness: This is likely combination of hyponatremia, poor by mouth intake, underlying cancer and persistent cough. PT consult.  3. Pneumonia: Patient may have underlying pneumonia which is not clearly visualized on chest x-ray due to dehydration. Patient will empirically be treated with Rocephin and azithromycin. She reports that rash with penicillin.  4. Hodgkin's lymphoma: Patient will need palliative care and oncology consult. Patient has baseline leukopenia.  All the records are reviewed and case discussed with ED provider. Management plans discussed with the patient and she in agreement  CODE STATUS: DNR  TOTAL TIME TAKING CARE OF THIS PATIENT: 45 minutes.    Zeta Bucy M.D on 07/19/2015 at 3:36 PM  Between 7am to 6pm - Pager - [phone]  After 6pm go to [email or web address] -  Social research officer, government  [facility]  Office  [phone]  CC: Primary care physician; Fidel Levy, MD

## 2015-07-19 NOTE — Progress Notes (Signed)
   07/19/15 2130  Clinical Encounter Type  Visited With Patient and family together  Visit Type Spiritual support  Referral From Nurse  Consult/Referral To Chaplain  Spiritual Encounters  Spiritual Needs Prayer  Stress Factors  Patient Stress Factors Exhausted  Family Stress Factors None identified  Patient and family desired prayers

## 2015-07-19 NOTE — Progress Notes (Signed)
Pharmacy Antibiotic Note  Caroline Russo is a 82 y.o. female admitted on 07/19/2015 with pneumonia.  Pharmacy has been consulted for Ceftriaxone dosing, patient is also ordered Azithromycin 500mg  IV q24h. Received Vancomycin 1g IV and Cefepime 2g IV in the ED.  Plan: Will order Ceftriaxone 1g IV q24h.  Height: 5\' 2"  (157.5 cm) Weight: 113 lb (51.256 kg) IBW/kg (Calculated) : 50.1  Temp (24hrs), Avg:100.1 F (37.8 C), Min:100.1 F (37.8 C), Max:100.1 F (37.8 C)   Recent Labs Lab 07/15/15 1300 07/19/15 1400  WBC 3.0* 2.2*  CREATININE 1.00 0.83  LATICACIDVEN  --  1.3    Estimated Creatinine Clearance: 32.8 mL/min (by C-G formula based on Cr of 0.83).    Allergies  Allergen Reactions  . Moxifloxacin Shortness Of Breath    hallucinations  . Codeine Other (See Comments)    unknown  . Prednisone     Hallucinations  . Unasyn [Ampicillin-Sulbactam Sodium] Other (See Comments)    unknown  . Penicillins Itching and Rash    Has patient had a PCN reaction causing immediate rash, facial/tongue/throat swelling, SOB or lightheadedness with hypotension: Yes Has patient had a PCN reaction causing severe rash involving mucus membranes or skin necrosis: No Has patient had a PCN reaction that required hospitalization No Has patient had a PCN reaction occurring within the last 10 years: No If all of the above answers are "NO", then may proceed with Cephalosporin use.      Antimicrobials this admission: Vancomycin x 1 dose 6/25 Cefepime x 1 dose 6/25 Ceftriaxone 6/25 >> Azithromycin 6/25 >>  Dose adjustments this admission:  Microbiology results:  Thank you for allowing pharmacy to be a part of this patient's care.  Paulina Fusi, PharmD, BCPS 07/19/2015 4:25 PM

## 2015-07-19 NOTE — Telephone Encounter (Signed)
Re:  Decline in health  Patient's daughter called about patient becoming progressively weaker.  She is "barely eating".  Fluid intake unknown.  She is sleeping all the time.  She was seen in the clinic on 06/21/by Georgeanne Nim, NP.  She received fluids and Decadron (4 mg) and improved for a couple of days.  Reviewed notes by Dr. Oliva Bustard.  Patient to have follow-up PET scan to reassess disease status.  Consideration of chemo (CVP) or palliative care/hospice.  Patient will be seen in ER today.   Lequita Asal, MD

## 2015-07-19 NOTE — ED Provider Notes (Signed)
[facility] Emergency Department Provider Note   ____________________________________________  Time seen: ~1320  I have reviewed the triage vital signs and the nursing notes.   HISTORY  Chief Complaint Weakness   History limited by: Not Limited   HPI Caroline Russo is a 82 y.o. female who comes to the emergency department today accompanied by her daughter because of concerns for progressive weakness. This is been getting worse for the past week. The patient now has a hard time getting out of bed by herself. Typically the patient is able to ambulate by herself with a cane. They have also noticed some fevers today. The patient denies any shortness of breath although has had a cough for the past 3 weeks. Daughter states x-rays were done last week such did not show any pneumonia. Cough has been productive of clear phlegm. Patient denies any dysuria or change in odor or color to her urine.   Past Medical History  Diagnosis Date  . HTN (hypertension)   . DM2 (diabetes mellitus, type 2) (HCC)     insulin requiring  . Hypercholesterolemia   . Murmur     hx  . Tinnitus     chronic  . Macular degeneration   . Chronic chest pain     a. nonobs cath 2002.  Marland Kitchen Palpitations     a. 01/2011 Holter monitor showed frequent PACs, sinus bradycardia  . Anxiety   . Depression   . Irregular cardiac rhythm   . Heart murmur     a. 09/2014 Echo: EF 55-60%, nl wm, mod MS, mild MR, mod dil LA, mild-mod TR.  . Cataract   . Non Hodgkin's lymphoma ([place])   . Hypomagnesemia 06/13/2014  . Non-obstructive CAD   . Orthostatic hypotension     Patient Active Problem List   Diagnosis Date Noted  . HTN (hypertension)   . DM2 (diabetes mellitus, type 2) ([place])   . Hypomagnesemia 06/13/2014  . Bradycardia 06/13/2014  . Lymphoma ([place]) 12/12/2013  . Pleural effusion on right 06/19/2013  . Headache 06/18/2013  . Shortness of breath 06/18/2013  . Malaise 06/18/2013  . Skin  irritation 02/08/2013  . PAC (premature atrial contraction) 12/04/2012  . Macular degeneration 09/03/2012  . Orthostatic hypotension 08/20/2012  . Palpitations   . Diabetes mellitus ([place]) 09/16/2011  . Back pain 09/16/2011  . CHEST PAIN-UNSPECIFIED 01/13/2010  . Hyperlipidemia 09/16/2009  . ESSENTIAL HYPERTENSION, BENIGN 09/16/2009  . CARDIAC MURMUR 09/16/2009    Past Surgical History  Procedure Laterality Date  . Cardiac catheterization  2002  . Tumor removal  2012    benign neck tumor removed  . Tooth extraction    . Knee surgery Left   . Ankle surgery Left   . Partial hysterectomy    . Insertion / placement pleural catheter      Current Outpatient Rx  Name  Route  Sig  Dispense  Refill  . ALPRAZolam (XANAX) 0.25 MG tablet   Oral   Take 1 tablet (0.25 mg total) by mouth every 6 (six) hours as needed.   30 tablet   1   . benzonatate (TESSALON) 100 MG capsule   Oral   Take 1 capsule (100 mg total) by mouth 3 (three) times daily as needed for cough.   30 capsule   0   . cyclobenzaprine (FLEXERIL) 10 MG tablet   Oral   Take 10 mg by mouth 2 (two) times daily as needed for muscle spasms. Reported on 07/15/2015         .  DOK 100 MG capsule      TAKE 1 CAPSULE(100 MG) BY MOUTH TWICE DAILY   60 capsule   0   . fluticasone (FLONASE) 50 MCG/ACT nasal spray   Each Nare   Place 1 spray into both nostrils daily.   16 g   6   . lidocaine-prilocaine (EMLA) cream   Topical   Apply 1 application topically as needed.   30 g   3   . losartan (COZAAR) 50 MG tablet      TAKE 1 TABLET BY MOUTH DAILY AFTER BREAKFAST.   90 tablet   3     **Patient requests 90 days supply**   . meclizine (ANTIVERT) 12.5 MG tablet   Oral   Take 12.5 mg by mouth 3 (three) times daily as needed.         . mirtazapine (REMERON) 15 MG tablet      TAKE 1 TABLET(15 MG) BY MOUTH AT BEDTIME   90 tablet   1     **Patient requests 90 days supply**   . Multiple Vitamin (MULTIVITAMIN  WITH MINERALS) TABS tablet   Oral   Take 1 tablet by mouth daily.         . nitroGLYCERIN (NITROSTAT) 0.4 MG SL tablet   Sublingual   Place 0.4 mg under the tongue every 5 (five) minutes as needed. Reported on 07/15/2015         . pantoprazole (PROTONIX) 40 MG tablet   Oral   Take 1 tablet (40 mg total) by mouth daily.   30 tablet   6   . polyethylene glycol powder (GLYCOLAX/MIRALAX) powder   Oral   Take 17 g by mouth daily as needed.   3350 g   0   . SLOW-MAG 71.5-119 MG TBEC SR tablet      TAKE 1 TABLET (64 MG TOTAL) BY MOUTH 2 (TWO) TIMES DAILY.   60 tablet   3   . traMADol (ULTRAM) 50 MG tablet      Take 1 tablet by mouth every 4-6 hours as needed for pain   90 tablet   0     Allergies Moxifloxacin; Codeine; Penicillins; Prednisone; and Unasyn  Family History  Problem Relation Age of Onset  . Heart disease Mother   . Lung cancer Brother     smoked  . Bladder Cancer Brother   . Stomach cancer Brother     Social History Social History  Substance Use Topics  . Smoking status: Former Smoker -- 0.25 packs/day for 7 years    Types: Cigarettes    Quit date: 01/25/1988  . Smokeless tobacco: Never Used  . Alcohol Use: No    Review of Systems  Constitutional: Positive for fever. Cardiovascular: Negative for chest pain. Respiratory: Negative for shortness of breath. Positive for cough. Gastrointestinal: Negative for abdominal pain, vomiting and diarrhea. Genitourinary: Negative for dysuria. Musculoskeletal: Negative for back pain. Skin: Negative for rash. Neurological: Negative for headaches, focal weakness or numbness.  10-point ROS otherwise negative.  ____________________________________________   PHYSICAL EXAM:  VITAL SIGNS: ED Triage Vitals  Enc Vitals Group     BP 07/19/15 1304 137/59 mmHg     Pulse Rate 07/19/15 1304 90     Resp 07/19/15 1304 19     Temp 07/19/15 1304 100.1 F (37.8 C)     Temp Source 07/19/15 1304 Oral     SpO2  07/19/15 1304 97 %     Weight 07/19/15 1304 113 lb (51.256  kg)     Height 07/19/15 1304 5\' 2"  (1.575 m)     Head Cir --      Peak Flow --      Pain Score 07/19/15 1306 0   Constitutional: Alert and oriented. Well appearing and in no distress. Eyes: Conjunctivae are normal. PERRL. Normal extraocular movements. ENT   Head: Normocephalic and atraumatic.   Nose: No congestion/rhinnorhea.   Mouth/Throat: Mucous membranes are moist.   Neck: No stridor. Hematological/Lymphatic/Immunilogical: No cervical lymphadenopathy. Cardiovascular: Normal rate, regular rhythm.  No murmurs, rubs, or gallops. Respiratory: Normal respiratory effort without tachypnea nor retractions. Breath sounds are clear and equal bilaterally. No wheezes/rales/rhonchi. Gastrointestinal: Soft and nontender. No distention.  Genitourinary: Deferred Musculoskeletal: Normal range of motion in all extremities. No joint effusions.  No lower extremity tenderness nor edema. Neurologic:  Normal speech and language. No gross focal neurologic deficits are appreciated.  Skin:  Skin is warm, dry and intact. No rash noted. Psychiatric: Mood and affect are normal. Speech and behavior are normal. Patient exhibits appropriate insight and judgment.  ____________________________________________    LABS (pertinent positives/negatives)  Labs Reviewed  COMPREHENSIVE METABOLIC PANEL - Abnormal; Notable for the following:    Sodium 130 (*)    Chloride 94 (*)    Glucose, Bld 161 (*)    Calcium 8.2 (*)    Total Protein 5.9 (*)    Albumin 2.5 (*)    GFR calc non Af Amer 59 (*)    All other components within normal limits  CBC WITH DIFFERENTIAL/PLATELET - Abnormal; Notable for the following:    WBC 2.2 (*)    Hemoglobin 11.6 (*)    HCT 33.2 (*)    RDW 15.3 (*)    Neutro Abs 0.8 (*)    All other components within normal limits  URINALYSIS COMPLETEWITH MICROSCOPIC ([facility] ONLY) - Abnormal; Notable for the following:    Color,  Urine YELLOW (*)    APPearance CLEAR (*)    Squamous Epithelial / LPF 0-5 (*)    All other components within normal limits  CULTURE, BLOOD (ROUTINE X 2)  CULTURE, BLOOD (ROUTINE X 2)  URINE CULTURE  LACTIC ACID, PLASMA  LACTIC ACID, PLASMA  TROPONIN I  TSH  PATHOLOGIST SMEAR REVIEW  CBC  BASIC METABOLIC PANEL     ____________________________________________  EKG  I, Nance Pear, attending physician, personally viewed and interpreted this EKG  EKG Time: 1305 Rate: 83 Rhythm: normal sinus rhythm Axis: normal Intervals: qtc 469 QRS: narrow ST changes: no st elevation Impression: normal ekg ____________________________________________    RADIOLOGY  CXR IMPRESSION: 1. Mild cardiomegaly with central pulmonary vascular congestion suggesting mild CHF/volume overload. No overt alveolar pulmonary edema. 2. Hazy opacities at each lung base, likely small layering pleural effusions. Suspect associated atelectasis at each lung base.  ____________________________________________   PROCEDURES  Procedure(s) performed: None  Critical Care performed: No  ____________________________________________   INITIAL IMPRESSION / ASSESSMENT AND PLAN / ED COURSE  Pertinent labs & imaging results that were available during my care of the patient were reviewed by me and considered in my medical decision making (see chart for details).  Patient presented to the emergency department today because of concerns for progressive weakness over 1 week. Patient vital signs concerning for infection. Given the length of patient's symptoms and vital signs Sepsis Was Called. Patient Will Be Given IV Fluids and Broad-Spectrum Antibiotics. Patient will be admitted to the hospital service.  ____________________________________________   FINAL CLINICAL IMPRESSION(S) / ED DIAGNOSES  Final diagnoses:  Fever     Note: This dictation was prepared with Dragon dictation. Any transcriptional  errors that result from this process are unintentional    Nance Pear, MD 07/19/15 [phone]

## 2015-07-20 ENCOUNTER — Inpatient Hospital Stay: Payer: Medicare Other

## 2015-07-20 DIAGNOSIS — Z87891 Personal history of nicotine dependence: Secondary | ICD-10-CM

## 2015-07-20 DIAGNOSIS — Z801 Family history of malignant neoplasm of trachea, bronchus and lung: Secondary | ICD-10-CM

## 2015-07-20 DIAGNOSIS — I10 Essential (primary) hypertension: Secondary | ICD-10-CM

## 2015-07-20 DIAGNOSIS — I2510 Atherosclerotic heart disease of native coronary artery without angina pectoris: Secondary | ICD-10-CM

## 2015-07-20 DIAGNOSIS — E119 Type 2 diabetes mellitus without complications: Secondary | ICD-10-CM

## 2015-07-20 DIAGNOSIS — R011 Cardiac murmur, unspecified: Secondary | ICD-10-CM

## 2015-07-20 DIAGNOSIS — C8290 Follicular lymphoma, unspecified, unspecified site: Secondary | ICD-10-CM | POA: Insufficient documentation

## 2015-07-20 DIAGNOSIS — Z794 Long term (current) use of insulin: Secondary | ICD-10-CM

## 2015-07-20 DIAGNOSIS — R509 Fever, unspecified: Secondary | ICD-10-CM

## 2015-07-20 DIAGNOSIS — Z79899 Other long term (current) drug therapy: Secondary | ICD-10-CM

## 2015-07-20 DIAGNOSIS — R627 Adult failure to thrive: Secondary | ICD-10-CM

## 2015-07-20 DIAGNOSIS — R059 Cough, unspecified: Secondary | ICD-10-CM | POA: Insufficient documentation

## 2015-07-20 DIAGNOSIS — J90 Pleural effusion, not elsewhere classified: Secondary | ICD-10-CM | POA: Insufficient documentation

## 2015-07-20 DIAGNOSIS — I5032 Chronic diastolic (congestive) heart failure: Secondary | ICD-10-CM

## 2015-07-20 DIAGNOSIS — E860 Dehydration: Secondary | ICD-10-CM

## 2015-07-20 DIAGNOSIS — Z800 Family history of malignant neoplasm of digestive organs: Secondary | ICD-10-CM

## 2015-07-20 DIAGNOSIS — C8590 Non-Hodgkin lymphoma, unspecified, unspecified site: Secondary | ICD-10-CM

## 2015-07-20 DIAGNOSIS — R001 Bradycardia, unspecified: Secondary | ICD-10-CM

## 2015-07-20 DIAGNOSIS — E7800 Pure hypercholesterolemia, unspecified: Secondary | ICD-10-CM

## 2015-07-20 DIAGNOSIS — R05 Cough: Secondary | ICD-10-CM

## 2015-07-20 DIAGNOSIS — F418 Other specified anxiety disorders: Secondary | ICD-10-CM

## 2015-07-20 LAB — BASIC METABOLIC PANEL
ANION GAP: 6 (ref 5–15)
BUN: 8 mg/dL (ref 6–20)
CALCIUM: 7.5 mg/dL — AB (ref 8.9–10.3)
CO2: 25 mmol/L (ref 22–32)
Chloride: 103 mmol/L (ref 101–111)
Creatinine, Ser: 0.62 mg/dL (ref 0.44–1.00)
GFR calc Af Amer: >60 mL/min (ref >60)
Glucose, Bld: 125 mg/dL — ABNORMAL HIGH (ref 65–99)
Potassium: 3.4 mmol/L — ABNORMAL LOW (ref 3.5–5.1)
Sodium: 134 mmol/L — ABNORMAL LOW (ref 135–145)

## 2015-07-20 LAB — CBC
HEMATOCRIT: 29.1 % — AB (ref 35.0–47.0)
HEMOGLOBIN: 10 g/dL — AB (ref 12.0–16.0)
MCH: 28.7 pg (ref 26.0–34.0)
MCHC: 34.3 g/dL (ref 32.0–36.0)
MCV: 83.6 fL (ref 80.0–100.0)
Platelets: 204 10*3/uL (ref 150–440)
RBC: 3.48 MIL/uL — AB (ref 3.80–5.20)
RDW: 15.2 % — ABNORMAL HIGH (ref 11.5–14.5)
WBC: 1.6 10*3/uL — ABNORMAL LOW (ref 3.6–11.0)

## 2015-07-20 LAB — URINE CULTURE

## 2015-07-20 MED ORDER — ALUM & MAG HYDROXIDE-SIMETH 200-200-20 MG/5ML PO SUSP
10.0000 mL | ORAL | Status: DC | PRN
  Administered 2015-07-20 – 2015-07-22 (×2): 10 mL via ORAL
  Filled 2015-07-20 (×2): qty 30

## 2015-07-20 MED ORDER — FLUTICASONE PROPIONATE 50 MCG/ACT NA SUSP
2.0000 | Freq: Every day | NASAL | Status: DC
  Administered 2015-07-20 – 2015-07-22 (×3): 2 via NASAL
  Filled 2015-07-20: qty 16

## 2015-07-20 MED ORDER — MIRTAZAPINE 15 MG PO TABS: 15.0000 mg | ORAL_TABLET | Freq: Every evening | ORAL | Status: DC | PRN

## 2015-07-20 MED ORDER — ALPRAZOLAM 0.25 MG PO TABS: 0.2500 mg | ORAL_TABLET | Freq: Three times a day (TID) | ORAL | Status: DC | PRN

## 2015-07-20 MED ORDER — BENZONATATE 100 MG PO CAPS
100.0000 mg | ORAL_CAPSULE | Freq: Three times a day (TID) | ORAL | Status: DC | PRN
  Filled 2015-07-20: qty 1

## 2015-07-20 MED ORDER — PANTOPRAZOLE SODIUM 40 MG PO TBEC
40.0000 mg | DELAYED_RELEASE_TABLET | Freq: Every day | ORAL | Status: DC
  Administered 2015-07-20 – 2015-07-22 (×3): 40 mg via ORAL
  Filled 2015-07-20 (×3): qty 1

## 2015-07-20 MED ORDER — ENSURE ENLIVE PO LIQD
237.0000 mL | Freq: Three times a day (TID) | ORAL | Status: DC
  Administered 2015-07-20 – 2015-07-22 (×6): 237 mL via ORAL

## 2015-07-20 MED ORDER — ADULT MULTIVITAMIN W/MINERALS CH
1.0000 | ORAL_TABLET | Freq: Every day | ORAL | Status: DC
  Administered 2015-07-20 – 2015-07-22 (×3): 1 via ORAL
  Filled 2015-07-20 (×3): qty 1

## 2015-07-20 MED ORDER — POTASSIUM CHLORIDE CRYS ER 20 MEQ PO TBCR
40.0000 meq | EXTENDED_RELEASE_TABLET | Freq: Once | ORAL | Status: AC
  Administered 2015-07-20: 10:00:00 40 meq via ORAL
  Filled 2015-07-20: qty 2

## 2015-07-20 NOTE — Consult Note (Signed)
Cardiology Consultation Note  Patient ID: Caroline Russo, MRN: [id number], DOB/AGE: [birth year] 80 y.o. Admit date: 07/19/2015   Date of Consult: 07/20/2015 Primary Physician: Dicky Doe, MD Primary Cardiologist: Rockey Situ  Chief Complaint: Weakness, cough Reason for Consult: Diastolic CHF, pleural effusion Consult requested by Dr. Benjie Karvonen for above  HPI: 82 y.o. female with h/o hypertension, nonobstructive coronary artery disease in 2002, diabetes, obesity Diagnosis of lymphoma, Chemotherapy has been completed, she is on Rituxan every 3 months Chronic dizziness, with previous Blood pressure measurements showing orthostasis.  HCTZ was held in the past, losartan dose was decreased.  She's had dramatic weight loss over the past several months. Chronic Coughing Symptoms improved after thoracentesis with Pleurx catheter placed, draining once per week 250 cc recently Chronic constipation   She presents to the hospital with weakness over the past several weeks, anorexia, more weight loss Family at the bedside reports she had viral syndrome starting several weeks ago, started on antibiotics, cough Pleurx catheter last drained for 5 days ago Feels better today, ate a meal Having difficulty eating after several of her teeth were taken out  They treated with antibiotics, also on IV fluids 75 mils per hour Chest x-ray suggesting pleural effusion Normal renal function   Other past medical issues Previous Holter monitor showing APCs, PVCs, rare short runs of atrial tachycardia  Previousfall while in the bathroom 06/09/2014, hit her right eye Weight is trending lower, previously 158 pounds, down to 150 pounds, 130 pounds, now 122 pounds  Previously treated with Rituxan, then Treanda and Rituxan with steroids  In the hospital May 2015 CT scan showed moderate right pleural effusion, diffuse adenopathy CT scan of the head without metastases Hospital records from 11/11/2013 detail diagnosis  of follicular lymphoma with chronic right-sided pleural effusion She was discharged from the hospital October 20, admission on October 17  Chronic problem with her vision. Hearing problem  She has macular degeneration.   Previous history of palpitations and event monitor was performed that showed occasional sinus bradycardia., Normal sinus rhythm mostly with frequent APCs. No other significant arrhythmia     Past Medical History  Diagnosis Date  . HTN (hypertension)   . DM2 (diabetes mellitus, type 2) (HCC)     insulin requiring  . Hypercholesterolemia   . Murmur     hx  . Tinnitus     chronic  . Macular degeneration   . Chronic chest pain     a. nonobs cath 2002.  Marland Kitchen Palpitations     a. 01/2011 Holter monitor showed frequent PACs, sinus bradycardia  . Anxiety   . Depression   . Irregular cardiac rhythm   . Heart murmur     a. 09/2014 Echo: EF 55-60%, nl wm, mod MS, mild MR, mod dil LA, mild-mod TR.  . Cataract   . Non Hodgkin's lymphoma ([place])   . Hypomagnesemia 06/13/2014  . Non-obstructive CAD   . Orthostatic hypotension       Most Recent Cardiac Studies: Echocardiogram dated September 2016 with ejection fraction 55-60%, moderate mitral valve stenosis    Surgical History:  Past Surgical History  Procedure Laterality Date  . Cardiac catheterization  2002  . Tumor removal  2012    benign neck tumor removed  . Tooth extraction    . Knee surgery Left   . Ankle surgery Left   . Partial hysterectomy    . Insertion / placement pleural catheter       Home Meds: Prior to Admission  medications   Medication Sig Start Date End Date Taking? Authorizing Provider  ALPRAZolam (XANAX) 0.25 MG tablet Take 1 tablet (0.25 mg total) by mouth every 6 (six) hours as needed. 11/05/14  Yes Forest Gleason, MD  benzonatate (TESSALON) 100 MG capsule Take 1 capsule (100 mg total) by mouth 3 (three) times daily as needed for cough. 07/07/15  Yes Evlyn Kanner, NP  cephALEXin (KEFLEX) 500  MG capsule Take 500 mg by mouth 2 (two) times daily. X 15 DAYS. 07/07/15  Yes Historical Provider, MD  cyclobenzaprine (FLEXERIL) 10 MG tablet Take 10 mg by mouth 2 (two) times daily as needed for muscle spasms. Reported on 07/15/2015   Yes Historical Provider, MD  DOK 100 MG capsule TAKE 1 CAPSULE(100 MG) BY MOUTH TWICE DAILY 07/03/15  Yes Evlyn Kanner, NP  fluticasone (FLONASE) 50 MCG/ACT nasal spray Place 1 spray into both nostrils daily. Patient taking differently: Place 2 sprays into both nostrils daily.  05/07/15  Yes Arlis Porta., MD  lidocaine-prilocaine (EMLA) cream Apply 1 application topically as needed. 07/18/14  Yes Forest Gleason, MD  losartan (COZAAR) 50 MG tablet TAKE 1 TABLET BY MOUTH DAILY AFTER BREAKFAST. Patient taking differently: TAKE 25 MG BY MOUTH DAILY AFTER BREAKFAST. 05/04/15  Yes Minna Merritts, MD  meclizine (ANTIVERT) 12.5 MG tablet Take 12.5 mg by mouth 3 (three) times daily as needed.   Yes Historical Provider, MD  mirtazapine (REMERON) 15 MG tablet TAKE 1 TABLET(15 MG) BY MOUTH AT BEDTIME Patient taking differently: TAKE 1 TABLET(15 MG) BY MOUTH AT BEDTIME AS NEEDED FOR SLEEP. 07/15/15  Yes Evlyn Kanner, NP  Multiple Vitamin (MULTIVITAMIN WITH MINERALS) TABS tablet Take 1 tablet by mouth daily.   Yes Historical Provider, MD  nitroGLYCERIN (NITROSTAT) 0.4 MG SL tablet Place 0.4 mg under the tongue every 5 (five) minutes x 3 doses as needed for chest pain. Reported on 07/15/2015   Yes Historical Provider, MD  pantoprazole (PROTONIX) 40 MG tablet Take 1 tablet (40 mg total) by mouth daily. Patient taking differently: Take 40 mg by mouth daily as needed. FOR HEARTBURN/INDIGESTION. 06/17/15  Yes Forest Gleason, MD  polyethylene glycol powder (GLYCOLAX/MIRALAX) powder Take 17 g by mouth daily as needed. 02/17/15  Yes Janak Choksi, MD  SLOW-MAG 71.5-119 MG TBEC SR tablet TAKE 1 TABLET (64 MG TOTAL) BY MOUTH 2 (TWO) TIMES DAILY. 01/02/15  Yes Forest Gleason, MD  traMADol  (ULTRAM) 50 MG tablet Take 1 tablet by mouth every 4-6 hours as needed for pain 06/17/15  Yes Forest Gleason, MD    Inpatient Medications:  . azithromycin  500 mg Intravenous Q24H  . cefTRIAXone (ROCEPHIN)  IV  1 g Intravenous Q24H  . enoxaparin (LOVENOX) injection  40 mg Subcutaneous Q24H  . feeding supplement (ENSURE ENLIVE)  237 mL Oral TID BM  . fluticasone  2 spray Each Nare Daily  . multivitamin with minerals  1 tablet Oral Daily  . pantoprazole  40 mg Oral QAC breakfast      Allergies:  Allergies  Allergen Reactions  . Moxifloxacin Shortness Of Breath    hallucinations  . Codeine Other (See Comments)    unknown  . Prednisone     Hallucinations  . Unasyn [Ampicillin-Sulbactam Sodium] Other (See Comments)    unknown  . Penicillins Itching and Rash    Has patient had a PCN reaction causing immediate rash, facial/tongue/throat swelling, SOB or lightheadedness with hypotension: Yes Has patient had a PCN reaction causing severe rash involving  mucus membranes or skin necrosis: No Has patient had a PCN reaction that required hospitalization No Has patient had a PCN reaction occurring within the last 10 years: No If all of the above answers are "NO", then may proceed with Cephalosporin use.      Social History   Social History  . Marital Status: Widowed    Spouse Name: N/A  . Number of Children: N/A  . Years of Education: N/A   Occupational History  . Retired    Social History Main Topics  . Smoking status: Former Smoker -- 0.25 packs/day for 7 years    Types: Cigarettes    Quit date: 01/25/1988  . Smokeless tobacco: Never Used  . Alcohol Use: No  . Drug Use: No  . Sexual Activity: Not on file   Other Topics Concern  . Not on file   Social History Narrative   Retired. Widowed. 3 children, 7 grandchildren, 2 great- grandchildren.      Family History  Problem Relation Age of Onset  . Heart disease Mother   . Lung cancer Brother     smoked  . Bladder Cancer  Brother   . Stomach cancer Brother      Review of Systems: Review of Systems  Constitutional: Positive for weight loss and malaise/fatigue.  Respiratory: Positive for cough.   Cardiovascular: Negative.   Gastrointestinal: Negative.   Musculoskeletal: Negative.   Neurological: Positive for weakness.  Psychiatric/Behavioral: Negative.   All other systems reviewed and are negative.   Labs:  Recent Labs  07/19/15 1400  TROPONINI <0.03   Lab Results  Component Value Date   WBC 1.6* 07/20/2015   HGB 10.0* 07/20/2015   HCT 29.1* 07/20/2015   MCV 83.6 07/20/2015   PLT 204 07/20/2015    Recent Labs Lab 07/19/15 1400 07/20/15 0418  NA 130* 134*  K 3.9 3.4*  CL 94* 103  CO2 27 25  BUN 12 8  CREATININE 0.83 0.62  CALCIUM 8.2* 7.5*  PROT 5.9*  --   BILITOT 0.7  --   ALKPHOS 100  --   ALT 28  --   AST 31  --   GLUCOSE 161* 125*   No results found for: CHOL, HDL, LDLCALC, TRIG No results found for: DDIMER  Radiology/Studies:  Dg Chest 1 View  07/20/2015  CLINICAL DATA:  Fever. EXAM: CHEST 1 VIEW COMPARISON:  07/19/2015. FINDINGS: Port-A-Cath in stable position. Mediastinum hilar structures normal. Stable cardiomegaly . Low lung volumes with bibasilar atelectasis and/or infiltrates/edema again noted. Bilateral pleural effusions. No pneumothorax. IMPRESSION: 1. Port-A-Cath in stable position. 2. Cardiomegaly with persistent bibasilar and atelectasis and/or infiltrates/edema and bilateral pleural effusions. Slight interim change. Findings suggest slight improvement of congestive heart failure. Electronically Signed   By: Marcello Moores  Register   On: 07/20/2015 07:25   Dg Chest 2 View  07/06/2015  CLINICAL DATA:  Chest congestion and productive cough for 3 weeks. EXAM: CHEST  2 VIEW COMPARISON:  Single view of the chest 02/10/2014. PET CT scan 05/26/2015. FINDINGS: Small bilateral pleural effusions are seen, greater on the right. Pleural drainage catheter in the right chest is noted.  Mild basilar atelectasis is seen. The lungs are otherwise clear. No pneumothorax. Heart size is normal. Port-A-Cath is in place. IMPRESSION: Small bilateral pleural effusions and mild basilar atelectasis. Pleural drainage catheter on the right is noted. Electronically Signed   By: Inge Rise M.D.   On: 07/06/2015 13:55   Ct Chest Wo Contrast  07/20/2015  CLINICAL  DATA:  Cough. Chest x-ray earlier today showing persistent bibasilar atelectasis and/or infiltrates and pleural effusions. EXAM: CT CHEST WITHOUT CONTRAST TECHNIQUE: Multidetector CT imaging of the chest was performed following the standard protocol without IV contrast. COMPARISON:  Chest x-ray from earlier same day. FINDINGS: Mediastinum/Lymph Nodes: Prominent atherosclerotic changes of the normal caliber thoracic aorta. Prominent mitral annulus calcifications. Scattered coronary artery calcifications. Heart size is normal. No pericardial effusion. Lungs/Pleura: Bilateral pleural effusions, moderate in size. Patchy consolidations at each lung base, left greater than right. Right-sided chest tube terminating medially at the azygoesophageal recess. Upper abdomen: No acute findings. Musculoskeletal: Degenerative changes throughout the thoracolumbar spine. No acute or suspicious osseous lesion. IMPRESSION: 1. Moderate-sized pleural effusions bilaterally. 2. Bibasilar consolidations, left greater than right. The left lower lobe consolidation is likely a combination of pneumonia and atelectasis. Smaller consolidation at the right lung base is likely compressive atelectasis. 3. Right-sided chest tube in place, terminating medially adjacent to the azygoesophageal recess. 4. Aortic atherosclerosis. Electronically Signed   By: Franki Cabot M.D.   On: 07/20/2015 16:42   Dg Chest Port 1 View  07/19/2015  CLINICAL DATA:  Increased weakness for 1 week. Non-Hodgkin's lymphoma. Mild disorientation. EXAM: PORTABLE CHEST 1 VIEW COMPARISON:  Chest x-rays dated  07/06/2015 and 02/10/2014. FINDINGS: Mild cardiomegaly is stable. Atherosclerotic calcifications noted at the aortic arch. There is mild central pulmonary vascular congestion. Hazy opacities at each lung base are likely layering pleural effusions with possible associated atelectasis. Upper lungs are relatively clear. Right chest wall Port-A-Cath is stable in position with tip overlying the mid SVC. IMPRESSION: 1. Mild cardiomegaly with central pulmonary vascular congestion suggesting mild CHF/volume overload. No overt alveolar pulmonary edema. 2. Hazy opacities at each lung base, likely small layering pleural effusions. Suspect associated atelectasis at each lung base. Electronically Signed   By: Franki Cabot M.D.   On: 07/19/2015 14:21    EKG: EKG reviewed showing normal sinus rhythm with rate in the 80s  Weights: Filed Weights   07/19/15 1304 07/19/15 1626 07/19/15 1637  Weight: 113 lb (51.256 kg) 116 lb (52.617 kg) 117 lb 5 oz (53.213 kg)     Physical Exam:Telemetry showing normal sinus rhythm Blood pressure 146/42, pulse 69, temperature 98.7 F (37.1 C), temperature source Oral, resp. rate 20, height 5\' 2"  (1.575 m), weight 117 lb 5 oz (53.213 kg), SpO2 99 %. Body mass index is 21.45 kg/(m^2). General: Well developed, well nourished, in no acute distress. Missing several teeth, Pleurx catheter in place Head: Normocephalic, atraumatic, sclera non-icteric, no xanthomas, nares are without discharge.  Neck: Negative for carotid bruits. JVD not elevated. Lungs: Dull at the bases bilaterally, no rales, or rhonchi. Breathing is unlabored. Heart: RRR with S1 S2. No murmurs, rubs, or gallops appreciated. Abdomen: Soft, non-tender, non-distended with normoactive bowel sounds. No hepatomegaly. No rebound/guarding. No obvious abdominal masses. Msk:  Strength and tone appear normal for age. Extremities: No clubbing or cyanosis. No edema.  Distal pedal pulses are 2+ and equal bilaterally. Neuro: Alert  and oriented X 3. No facial asymmetry. No focal deficit. Moves all extremities spontaneously. Psych:  Responds to questions appropriately with a normal affect.    Assessment and Plan:   1. Weakness, anorexia, temperature Likely viral URI, unable to exclude secondary bacterial bronchitis Currently on antibiotics  2 Pleural effusion Bilateral on CT scan Secondary to recurrent follicular lymphoma Likely also has diastolic heart failure --- Will stop IV fluids for now as sodium has improved ----Would drain effusion on the  right with Pleurx catheter for comfort ----May need gentle diuresis for effusion on the left for comfort though have to watch sodium level closely  3. Hyponatremia Improved on IV fluids, 134  4. Anorexia Secondary to lymphoma, failure to thrive Encourage boost, milkshakes as unable to eat well without her teeth  5. Orthostasis Previous problems with orthostasis after diuresis Currently bedbound, less of an issue though if diuresis is started for effusion on the left, would need to proceed cautiously   Total encounter time more than 80 minutes  Greater than 50% was spent in counseling and coordination of care with the patient   Signed, Esmond Plants, MD. Ph.D [facility] 07/20/2015, 5:58 PM

## 2015-07-20 NOTE — Consult Note (Signed)
[facility] NOTE  Patient Care Team: Arlis Porta., MD as PCP - General (Unknown Physician Specialty) Minna Merritts, MD as Consulting Physician (Cardiology)  CHIEF COMPLAINTS/PURPOSE OF CONSULTATION: Non-Hodgkin's lymphoma recurrent /failure to thrive  HISTORY OF PRESENTING ILLNESS:  [place] 82 y.o.  female with multiple medical problems most recently- recurrent non-Hodgkin's lymphoma- currently on Rituxan-prednisone is currently with the hospital for failure to thrive/dehydration. Patient also had low-grade temperature;    The emergency room patient was noted to have white count of 1.6 hemoglobin 10; sodium 1:30. Chest x-ray suggestive of fluid overload. Patient is currently on antibiotics.   Since being in hospital patient's Clinical status has improved; she is eating better/feeling better. No fevers.   ROS: A complete 10 point review of system is done which is negative except mentioned above in history of present illness  MEDICAL HISTORY:  Past Medical History  Diagnosis Date  . HTN (hypertension)   . DM2 (diabetes mellitus, type 2) (HCC)     insulin requiring  . Hypercholesterolemia   . Murmur     hx  . Tinnitus     chronic  . Macular degeneration   . Chronic chest pain     a. nonobs cath 2002.  Marland Kitchen Palpitations     a. 01/2011 Holter monitor showed frequent PACs, sinus bradycardia  . Anxiety   . Depression   . Irregular cardiac rhythm   . Heart murmur     a. 09/2014 Echo: EF 55-60%, nl wm, mod MS, mild MR, mod dil LA, mild-mod TR.  . Cataract   . Non Hodgkin's lymphoma ([place])   . Hypomagnesemia 06/13/2014  . Non-obstructive CAD   . Orthostatic hypotension     SURGICAL HISTORY: Past Surgical History  Procedure Laterality Date  . Cardiac catheterization  2002  . Tumor removal  2012    benign neck tumor removed  . Tooth extraction    . Knee surgery Left   . Ankle surgery Left   . Partial hysterectomy    . Insertion / placement  pleural catheter      SOCIAL HISTORY: Social History   Social History  . Marital Status: Widowed    Spouse Name: N/A  . Number of Children: N/A  . Years of Education: N/A   Occupational History  . Retired    Social History Main Topics  . Smoking status: Former Smoker -- 0.25 packs/day for 7 years    Types: Cigarettes    Quit date: 01/25/1988  . Smokeless tobacco: Never Used  . Alcohol Use: No  . Drug Use: No  . Sexual Activity: Not on file   Other Topics Concern  . Not on file   Social History Narrative   Retired. Widowed. 3 children, 7 grandchildren, 2 great- grandchildren.     FAMILY HISTORY: Family History  Problem Relation Age of Onset  . Heart disease Mother   . Lung cancer Brother     smoked  . Bladder Cancer Brother   . Stomach cancer Brother     ALLERGIES:  is allergic to moxifloxacin; codeine; prednisone; unasyn; and penicillins.  MEDICATIONS:  Current Facility-Administered Medications  Medication Dose Route Frequency Provider Last Rate Last Dose  . 0.9 %  sodium chloride infusion   Intravenous Continuous Bettey Costa, MD 75 mL/hr at 07/20/15 0240    . acetaminophen (TYLENOL) tablet 650 mg  650 mg Oral Q6H PRN Bettey Costa, MD       Or  .  acetaminophen (TYLENOL) suppository 650 mg  650 mg Rectal Q6H PRN Bettey Costa, MD      . alum & mag hydroxide-simeth (MAALOX/MYLANTA) 200-200-20 MG/5ML suspension 10 mL  10 mL Oral Q4H PRN Debby Crosley, MD   10 mL at 07/20/15 0315  . azithromycin (ZITHROMAX) 500 mg in dextrose 5 % 250 mL IVPB  500 mg Intravenous Q24H Bettey Costa, MD   500 mg at 07/19/15 1807  . cefTRIAXone (ROCEPHIN) 1 g in dextrose 5 % 50 mL IVPB  1 g Intravenous Q24H Vira Blanco, RPH   1 g at 07/19/15 1650  . enoxaparin (LOVENOX) injection 40 mg  40 mg Subcutaneous Q24H Bettey Costa, MD   40 mg at 07/19/15 2100  . feeding supplement (ENSURE ENLIVE) (ENSURE ENLIVE) liquid 237 mL  237 mL Oral TID BM Vipul Shah, MD      . ondansetron (ZOFRAN) tablet 4 mg  4  mg Oral Q6H PRN Bettey Costa, MD       Or  . ondansetron (ZOFRAN) injection 4 mg  4 mg Intravenous Q6H PRN Sital Mody, MD      . pantoprazole (PROTONIX) EC tablet 40 mg  40 mg Oral QAC breakfast Debby Crosley, MD   40 mg at 07/20/15 0315  . senna-docusate (Senokot-S) tablet 1 tablet  1 tablet Oral QHS PRN Bettey Costa, MD       Facility-Administered Medications Ordered in Other Encounters  Medication Dose Route Frequency Provider Last Rate Last Dose  . heparin lock flush 100 unit/mL  500 Units Intravenous Once Forest Gleason, MD   500 Units at 07/24/14 1630  . sodium chloride 0.9 % injection 10 mL  10 mL Intravenous PRN Forest Gleason, MD   10 mL at 07/24/14 1020  . sodium chloride 0.9 % injection 10 mL  10 mL Intracatheter PRN Forest Gleason, MD   10 mL at 07/24/14 1020  . sodium chloride 0.9 % injection 10 mL  10 mL Intravenous PRN Forest Gleason, MD   10 mL at 08/12/14 0834      .  PHYSICAL EXAMINATION:   Filed Vitals:   07/20/15 0244 07/20/15 0448  BP:  102/56  Pulse:  66  Temp: 98.8 F (37.1 C) 99.5 F (37.5 C)  Resp:  18   Filed Weights   07/19/15 1304 07/19/15 1626 07/19/15 1637  Weight: 113 lb (51.256 kg) 116 lb (52.617 kg) 117 lb 5 oz (53.213 kg)    GENERAL: Elderly frail appearing Caucasian female patient resting in the bed eating chips Alert, no distress and comfortable.   She is a complete by daughter. EYES: no pallor or icterus OROPHARYNX: no thrush or ulceration.  NECK: supple, no masses felt LYMPH:  no palpable lymphadenopathy in the cervical, axillary or inguinal regions LUNGS: decreased breath sounds to auscultation and  No wheeze or crackles HEART/CVS: regular rate & rhythm and no murmurs; No lower extremity edema ABDOMEN: abdomen soft, non-tender and normal bowel sounds Musculoskeletal:no cyanosis of digits and no clubbing  PSYCH: alert & oriented x 3 with fluent speech NEURO: no focal motor/sensory deficits SKIN:  no rashes or significant lesions  LABORATORY  DATA:  I have reviewed the data as listed Lab Results  Component Value Date   WBC 1.6* 07/20/2015   HGB 10.0* 07/20/2015   HCT 29.1* 07/20/2015   MCV 83.6 07/20/2015   PLT 204 07/20/2015    Recent Labs  07/07/15 1100 07/15/15 1300 07/19/15 1400 07/20/15 0418  NA 131* 130* 130* 134*  K 3.5 3.9 3.9 3.4*  CL 97* 95* 94* 103  CO2 26 26 27 25   GLUCOSE 219* 188* 161* 125*  BUN 10 16 12 8   CREATININE 0.88 1.00 0.83 0.62  CALCIUM 8.3* 8.4* 8.2* 7.5*  GFRNONAA 55* 47* 59* >60  GFRAA >60 54* >60 >60  PROT 6.6 6.4* 5.9*  --   ALBUMIN 3.3* 2.9* 2.5*  --   AST 23 79* 31  --   ALT 10* 61* 28  --   ALKPHOS 93 129* 100  --   BILITOT 0.7 0.7 0.7  --     RADIOGRAPHIC STUDIES: I have personally reviewed the radiological images as listed and agreed with the findings in the report. Dg Chest 1 View  07/20/2015  CLINICAL DATA:  Fever. EXAM: CHEST 1 VIEW COMPARISON:  07/19/2015. FINDINGS: Port-A-Cath in stable position. Mediastinum hilar structures normal. Stable cardiomegaly . Low lung volumes with bibasilar atelectasis and/or infiltrates/edema again noted. Bilateral pleural effusions. No pneumothorax. IMPRESSION: 1. Port-A-Cath in stable position. 2. Cardiomegaly with persistent bibasilar and atelectasis and/or infiltrates/edema and bilateral pleural effusions. Slight interim change. Findings suggest slight improvement of congestive heart failure. Electronically Signed   By: Marcello Moores  Register   On: 07/20/2015 07:25   Dg Chest 2 View  07/06/2015  CLINICAL DATA:  Chest congestion and productive cough for 3 weeks. EXAM: CHEST  2 VIEW COMPARISON:  Single view of the chest 02/10/2014. PET CT scan 05/26/2015. FINDINGS: Small bilateral pleural effusions are seen, greater on the right. Pleural drainage catheter in the right chest is noted. Mild basilar atelectasis is seen. The lungs are otherwise clear. No pneumothorax. Heart size is normal. Port-A-Cath is in place. IMPRESSION: Small bilateral pleural  effusions and mild basilar atelectasis. Pleural drainage catheter on the right is noted. Electronically Signed   By: Inge Rise M.D.   On: 07/06/2015 13:55   Dg Chest Port 1 View  07/19/2015  CLINICAL DATA:  Increased weakness for 1 week. Non-Hodgkin's lymphoma. Mild disorientation. EXAM: PORTABLE CHEST 1 VIEW COMPARISON:  Chest x-rays dated 07/06/2015 and 02/10/2014. FINDINGS: Mild cardiomegaly is stable. Atherosclerotic calcifications noted at the aortic arch. There is mild central pulmonary vascular congestion. Hazy opacities at each lung base are likely layering pleural effusions with possible associated atelectasis. Upper lungs are relatively clear. Right chest wall Port-A-Cath is stable in position with tip overlying the mid SVC. IMPRESSION: 1. Mild cardiomegaly with central pulmonary vascular congestion suggesting mild CHF/volume overload. No overt alveolar pulmonary edema. 2. Hazy opacities at each lung base, likely small layering pleural effusions. Suspect associated atelectasis at each lung base. Electronically Signed   By: Franki Cabot M.D.   On: 07/19/2015 14:21    ASSESSMENT & PLAN:   # 82 year old female patient with multiple medical problems- including recent recurrence of her follicular lymphoma/ this currently admitted to hospital for failure to thrive/dehydration/low-grade fever.  # Failure to thrive/dehydration- multifactorial. Currently with hydration patient's mentation has improved. She is eating better.  #Episode of fever- pulmonary infection versus others. Chest x-ray suggestive of fluid overload. Awaiting noncontrast CT.  # Recurrent follicular lymphoma- currently on Rituxan prednisone. Poor tolerance to therapy/multifactorial  # Right pleural drainage/Pleurx catheter- continue drainage as needed  # Given the multiple medical problems /failure to thrive - family interested in hospice. I spoke to daughter at the bedside. Spoke to the patient - that given her overall   multiple medical problems /on ongoing treatment will be difficult. Patient is interested in hospice also.  Also discussed  with hospice nurse Santiago Glad.   Thank you Dr. Manuella Ghazi for allowing me to participate in the care of your pleasant patient. Please do not hesitate to contact me with questions or concerns in the interim.     Cammie Sickle, MD 07/20/2015 1:05 PM

## 2015-07-20 NOTE — Care Management (Signed)
Rounded on patient to discuss discharge planning. List of home health agencies left with patient. Family at bedside; patient was working with nursing. Later received a referral to hospice screening. RNCM will need to follow up with patient regarding this need.

## 2015-07-20 NOTE — Care Management (Signed)
Hospice list left with one of patient's daughters to share with her other sisters. Per PT patient will need a rolling walker.

## 2015-07-20 NOTE — Evaluation (Signed)
Physical Therapy Evaluation Patient Details Name: Caroline Russo MRN: [id number] DOB: [birth year] Today's Date: 07/20/2015   History of Present Illness  Pt is a pleasant 82 y/o female that presents with increased weakness and difficulty breathing. She has a history of Hodgkin's Lymphoma and was found to have pneumonia.   Clinical Impression  Patient seen for progressive weakness over recent weeks. She has a history of cancer and recent weight loss, decreased appetite per chart. In this session she is able to ambulate household distances with RW with minimal difficulty with bed exit, she has caregivers present (family or staffed) 24/7. She appears to be slightly deconditioned relative to her baseline, and would benefit from use of a RW and HHPT to increase her safety with household mobility.     Follow Up Recommendations Home health PT    Equipment Recommendations  Rolling walker with 5" wheels    Recommendations for Other Services       Precautions / Restrictions Precautions Precautions: Fall Restrictions Weight Bearing Restrictions: No      Mobility  Bed Mobility Overal bed mobility: Needs Assistance Bed Mobility: Supine to Sit     Supine to sit: Min guard;HOB elevated     General bed mobility comments: Patient requires prolonged time with HOB elevated to come to edge of bed.   Transfers Overall transfer level: Needs assistance Equipment used: Rolling walker (2 wheeled) Transfers: Sit to/from Stand Sit to Stand: Min guard         General transfer comment: Patient educated on hand placement, able to transfer without PT assistance slowly.   Ambulation/Gait Ambulation/Gait assistance: Min guard Ambulation Distance (Feet): 120 Feet Assistive device: Rolling walker (2 wheeled) Gait Pattern/deviations: Step-through pattern   Gait velocity interpretation: Below normal speed for age/gender General Gait Details: Patient takes slow reciprocal stepping paatern, requires  cuing for turning instructions with RW in hands. No other gait disturbances noted.   Stairs            Wheelchair Mobility    Modified Rankin (Stroke Patients Only)       Balance Overall balance assessment: Needs assistance Sitting-balance support: No upper extremity supported Sitting balance-Leahy Scale: Good     Standing balance support: Bilateral upper extremity supported Standing balance-Leahy Scale: Fair Standing balance comment: No loss of balance noted, though decreased stride lengths suggest she has weakness in bilateral LEs and is at risk for falling.                              Pertinent Vitals/Pain Pain Assessment:  (Patient reports intermittent chronic low back pain)    Home Living Family/patient expects to be discharged to:: Private residence Living Arrangements: Children Available Help at Discharge: Family Type of Home: House Home Access: Stairs to enter Entrance Stairs-Rails: None Entrance Stairs-Number of Steps: 2 Home Layout: One level Home Equipment: Cane - single point;Toilet riser      Prior Function Level of Independence: Independent with assistive device(s)         Comments: Patient has been able to ambulate and get to bathroom with SPC, she does have a history of falls.      Hand Dominance        Extremity/Trunk Assessment   Upper Extremity Assessment: Overall WFL for tasks assessed           Lower Extremity Assessment: Overall WFL for tasks assessed         Communication  Communication: No difficulties  Cognition Arousal/Alertness: Awake/alert Behavior During Therapy: WFL for tasks assessed/performed Overall Cognitive Status: Within Functional Limits for tasks assessed                      General Comments      Exercises Other Exercises Other Exercises: Assisted patient to bathroom with cga x1 and RW.       Assessment/Plan    PT Assessment Patient needs continued PT services  PT  Diagnosis Difficulty walking   PT Problem List Decreased strength;Decreased knowledge of use of DME;Decreased safety awareness;Decreased balance;Decreased mobility  PT Treatment Interventions DME instruction;Gait training;Stair training;Therapeutic activities;Therapeutic exercise;Balance training   PT Goals (Current goals can be found in the Care Plan section) Acute Rehab PT Goals Patient Stated Goal: To return home and get stronger.  PT Goal Formulation: With patient/family Time For Goal Achievement: 08/03/15 Potential to Achieve Goals: Good    Frequency Min 2X/week   Barriers to discharge        Co-evaluation               End of Session Equipment Utilized During Treatment: Gait belt Activity Tolerance: Patient tolerated treatment well Patient left: in chair;with call bell/phone within reach;with chair alarm set;with family/visitor present Nurse Communication: Mobility status         Time: [id number] PT Time Calculation (min) (ACUTE ONLY): 24 min   Charges:   PT Evaluation $PT Eval Moderate Complexity: 1 Procedure PT Treatments $Therapeutic Activity: 8-22 mins   PT G Codes:       Kerman Passey, PT, DPT    07/20/2015, 5:14 PM

## 2015-07-20 NOTE — Progress Notes (Signed)
Initial Nutrition Assessment  DOCUMENTATION CODES:   Severe malnutrition in context of chronic illness  INTERVENTION:   Recommend atleast Dysphagia III and if still having trouble recommend downgrading again Recommend Ensure Enlive po TID, each supplement provides 350 kcal and 20 grams of protein and will order Magic Cup on meal trays.   NUTRITION DIAGNOSIS:   Malnutrition related to chronic illness as evidenced by energy intake < or equal to 75% for > or equal to 1 month, severe depletion of muscle mass, moderate depletion of body fat.  GOAL:   Patient will meet greater than or equal to 90% of their needs  MONITOR:   PO intake, Supplement acceptance, Labs, Weight trends, I & O's  REASON FOR ASSESSMENT:   Malnutrition Screening Tool    ASSESSMENT:   Pt admitted with hyponatremia with a known h/o Hodgkin's lymphoma.  Past Medical History  Diagnosis Date  . HTN (hypertension)   . DM2 (diabetes mellitus, type 2) (HCC)     insulin requiring  . Hypercholesterolemia   . Murmur     hx  . Tinnitus     chronic  . Macular degeneration   . Chronic chest pain     a. nonobs cath 2002.  Marland Kitchen Palpitations     a. 01/2011 Holter monitor showed frequent PACs, sinus bradycardia  . Anxiety   . Depression   . Irregular cardiac rhythm   . Heart murmur     a. 09/2014 Echo: EF 55-60%, nl wm, mod MS, mild MR, mod dil LA, mild-mod TR.  . Cataract   . Non Hodgkin's lymphoma ([place])   . Hypomagnesemia 06/13/2014  . Non-obstructive CAD   . Orthostatic hypotension     Diet Order:  DIET DYS 3 Room service appropriate?: Yes; Fluid consistency:: Thin  Pt reports eating well this morning, 'the most I have eaten in a while.' Pt family reports pt ate over 75% of breakfast, including toast with jelly, eggs and oatmeal.  Pt family reports very poor po intake for the past 2 weeks, eating bites of meals and drinking Ensure mixed with ice cream once a day sometimes 2.  Pt family reports poor po intake  really started 2 months or so ago eating less of meals than usual and sometimes skipping meals. Family noted that her appetite was decreasing and then when the pt lost her dentures, her po intake dropped significantly.  Medications: Protonix, NS at 63mL/hr Labs: Na 134, K 3.4, Ca 7.5, Mg 1.6, glucose 125   Gastrointestinal Profile: Last BM:  07/18/2015   Nutrition-Focused Physical Exam Findings: Nutrition-Focused physical exam completed. Findings are mild-moderate fat depletion, moderate-severe muscle depletion, and no edema.     Weight Change: Pt family reports pt weight was 163lbs 2 years ago and it continues to drop, pt reports weight of 113lbs last week at [facility]. (9% weight loss in 3 months per [facility] weight trends)   Skin:  Reviewed, no issues    Height:   Ht Readings from Last 1 Encounters:  07/19/15 5\' 2"  (1.575 m)    Weight:   Wt Readings from Last 1 Encounters:  07/19/15 117 lb 5 oz (53.213 kg)   Wt Readings from Last 10 Encounters:  07/19/15 117 lb 5 oz (53.213 kg)  07/15/15 113 lb (51.256 kg)  07/07/15 117 lb 9.6 oz (53.343 kg)  06/25/15 118 lb (53.524 kg)  06/24/15 119 lb (53.978 kg)  06/17/15 120 lb 3 oz (54.517 kg)  05/27/15 120 lb 8 oz (54.658  kg)  05/07/15 121 lb 9.6 oz (55.157 kg)  03/31/15 124 lb (56.246 kg)  03/17/15 119 lb 4.8 oz (54.114 kg)    BMI:  Body mass index is 21.45 kg/(m^2).    Estimated Nutritional Needs:   Kcal:  1375-1607kcals  Protein:  63-74g protein  Fluid:  >/= 1.4L fluid  EDUCATION NEEDS:   Education needs no appropriate at this time  Dwyane Luo, RD, LDN Pager [phone] Weekend/On-Call Pager [phone]

## 2015-07-20 NOTE — Clinical Documentation Improvement (Signed)
Internal Medicine  Please update your documentation within the medical record to reflect your response to this query. Thank you  Can the diagnosis of Malnutrition be further specified?   Document Severity - Severe(third degree), Moderate (second degree), Mild (first degree)  Other condition  Unable to clinically determine  Document any associated diagnoses/conditions  Supporting Information: :  07/20/15 Nutrition assessment noted with nutrition dx .Marland KitchenMarland Kitchen"Severe malnutrition in context of chronic illness"... See full nutrition assessment for full evaluation and treatment recommendations  Please exercise your independent, professional judgment when responding. A specific answer is not anticipated or expected.  Thank You, Ermelinda Das, RN, BSN, [place] Certified Clinical Documentation Specialist [place]: Health Information Management [phone]

## 2015-07-20 NOTE — Progress Notes (Addendum)
[employer] [facility] - [place] at [facility]   PATIENT NAME: Caroline Russo    MR#:  [id number]  DATE OF BIRTH:  10-01-1919  SUBJECTIVE:  CHIEF COMPLAINT:   Chief Complaint  Patient presents with  . Weakness  weakness  REVIEW OF SYSTEMS:  Review of Systems  Constitutional: Positive for weight loss and malaise/fatigue. Negative for fever and diaphoresis.  HENT: Negative for ear discharge, ear pain, hearing loss, nosebleeds, sore throat and tinnitus.   Eyes: Negative for blurred vision and pain.  Respiratory: Positive for shortness of breath. Negative for cough, hemoptysis and wheezing.   Cardiovascular: Negative for chest pain, palpitations, orthopnea and leg swelling.  Gastrointestinal: Negative for heartburn, nausea, vomiting, abdominal pain, diarrhea, constipation and blood in stool.  Genitourinary: Negative for dysuria, urgency and frequency.  Musculoskeletal: Negative for myalgias and back pain.  Skin: Negative for itching and rash.  Neurological: Positive for weakness. Negative for dizziness, tingling, tremors, focal weakness, seizures and headaches.  Psychiatric/Behavioral: Negative for depression. The patient is not nervous/anxious.     DRUG ALLERGIES:   Allergies  Allergen Reactions  . Moxifloxacin Shortness Of Breath    hallucinations  . Codeine Other (See Comments)    unknown  . Prednisone     Hallucinations  . Unasyn [Ampicillin-Sulbactam Sodium] Other (See Comments)    unknown  . Penicillins Itching and Rash    Has patient had a PCN reaction causing immediate rash, facial/tongue/throat swelling, SOB or lightheadedness with hypotension: Yes Has patient had a PCN reaction causing severe rash involving mucus membranes or skin necrosis: No Has patient had a PCN reaction that required hospitalization No Has patient had a PCN reaction occurring within the last 10 years: No If all of the above answers are "NO", then may proceed with Cephalosporin use.     VITALS:  Blood pressure 102/56, pulse 66, temperature 99.5 F (37.5 C), temperature source Oral, resp. rate 18, height 5\' 2"  (1.575 m), weight 53.213 kg (117 lb 5 oz), SpO2 96 %. PHYSICAL EXAMINATION:  Physical Exam  Constitutional: She is oriented to person, place, and time. She appears malnourished. She appears cachectic.  HENT:  Head: Normocephalic and atraumatic.  Eyes: Conjunctivae and EOM are normal. Pupils are equal, round, and reactive to light.  Neck: Normal range of motion. Neck supple. No tracheal deviation present. No thyromegaly present.  Cardiovascular: Normal rate, regular rhythm and normal heart sounds.   Pulmonary/Chest: Effort normal and breath sounds normal. No respiratory distress. She has no wheezes. She exhibits no tenderness.  Abdominal: Soft. Bowel sounds are normal. She exhibits no distension. There is no tenderness.  Musculoskeletal: Normal range of motion.  Neurological: She is alert and oriented to person, place, and time. No cranial nerve deficit.  Skin: Skin is warm and dry. No rash noted.  Psychiatric: Mood and affect normal.   LABORATORY PANEL:   CBC  Recent Labs Lab 07/20/15 0418  WBC 1.6*  HGB 10.0*  HCT 29.1*  PLT 204   ------------------------------------------------------------------------------------------------------------------ Chemistries   Recent Labs Lab 07/15/15 1300 07/19/15 1400 07/20/15 0418  NA 130* 130* 134*  K 3.9 3.9 3.4*  CL 95* 94* 103  CO2 26 27 25   GLUCOSE 188* 161* 125*  BUN 16 12 8   CREATININE 1.00 0.83 0.62  CALCIUM 8.4* 8.2* 7.5*  MG 1.6*  --   --   AST 79* 31  --   ALT 61* 28  --   ALKPHOS 129* 100  --   BILITOT 0.7  0.7  --    RADIOLOGY:  Dg Chest 1 View  07/20/2015  CLINICAL DATA:  Fever. EXAM: CHEST 1 VIEW COMPARISON:  07/19/2015. FINDINGS: Port-A-Cath in stable position. Mediastinum hilar structures normal. Stable cardiomegaly . Low lung volumes with bibasilar atelectasis and/or infiltrates/edema  again noted. Bilateral pleural effusions. No pneumothorax. IMPRESSION: 1. Port-A-Cath in stable position. 2. Cardiomegaly with persistent bibasilar and atelectasis and/or infiltrates/edema and bilateral pleural effusions. Slight interim change. Findings suggest slight improvement of congestive heart failure. Electronically Signed   By: Maisie Fus  Register   On: 07/20/2015 07:25   Dg Chest Port 1 View  07/19/2015  CLINICAL DATA:  Increased weakness for 1 week. Non-Hodgkin's lymphoma. Mild disorientation. EXAM: PORTABLE CHEST 1 VIEW COMPARISON:  Chest x-rays dated 07/06/2015 and 02/10/2014. FINDINGS: Mild cardiomegaly is stable. Atherosclerotic calcifications noted at the aortic arch. There is mild central pulmonary vascular congestion. Hazy opacities at each lung base are likely layering pleural effusions with possible associated atelectasis. Upper lungs are relatively clear. Right chest wall Port-A-Cath is stable in position with tip overlying the mid SVC. IMPRESSION: 1. Mild cardiomegaly with central pulmonary vascular congestion suggesting mild CHF/volume overload. No overt alveolar pulmonary edema. 2. Hazy opacities at each lung base, likely small layering pleural effusions. Suspect associated atelectasis at each lung base. Electronically Signed   By: Bary Richard M.D.   On: 07/19/2015 14:21   ASSESSMENT AND PLAN:  82 year old female with Hodgkin's lymphoma who presents with generalized weakness and persistent cough.  1. Hyponatremia: Improving with IV hydration.  - Sodium went up from 130 to 134  2. Generalized weakness: This is likely multifactorial with combination of hyponatremia, poor by mouth intake, underlying cancer and persistent cough. - Her daughter reports weight loss of about at least 40 pounds over last couple months, especially since she does not have her normal teeth and has dentures.  She is not eating much  3. Pneumonia: Patient may have underlying pneumonia which is not clearly  visualized on chest x-ray due to dehydration. Patient will empirically be treated with Rocephin and azithromycin. She reports that rash with penicillin. - We will get a CT chest for further evaluation and if no definite pneumonia - can stop Antibiotics  4. Hodgkin's lymphoma: Pending palliative care and oncology consult. Patient has baseline leukopenia. - Neutropenic precautions  5.  Hypokalemia - Replete and recheck    All the records are reviewed and case discussed with Care Management/Social Worker. Management plans discussed with the patient, family and they are in agreement.  CODE STATUS: DO NOT RESUSCITATE  TOTAL TIME TAKING CARE OF THIS PATIENT: 35 minutes.   More than 50% of the time was spent in counseling/coordination of care: YES (case discussed with patient's daughter at bedside who is in agreement with palliative care and/or hospice.  Patient does not want to go chemotherapy again)  POSSIBLE D/C IN 1-2 DAYS, DEPENDING ON CLINICAL CONDITION.   [facility], Benen Weida M.D on 07/20/2015 at 7:47 AM  Between 7am to 6pm - Pager - [phone]  After 6pm go to [email or web address] - Social research officer, government  [facility] [place] [facility]  Office  [phone]  CC: Primary care physician; Fidel Levy, MD  Note: This dictation was prepared with Dragon dictation along with smaller phrase technology. Any transcriptional errors that result from this process are unintentional.

## 2015-07-21 DIAGNOSIS — E43 Unspecified severe protein-calorie malnutrition: Secondary | ICD-10-CM | POA: Insufficient documentation

## 2015-07-21 LAB — CBC
HCT: 30.5 % — ABNORMAL LOW (ref 35.0–47.0)
Hemoglobin: 10.4 g/dL — ABNORMAL LOW (ref 12.0–16.0)
MCH: 28.2 pg (ref 26.0–34.0)
MCHC: 34.1 g/dL (ref 32.0–36.0)
MCV: 82.7 fL (ref 80.0–100.0)
PLATELETS: 219 10*3/uL (ref 150–440)
RBC: 3.68 MIL/uL — AB (ref 3.80–5.20)
RDW: 15.3 % — ABNORMAL HIGH (ref 11.5–14.5)
WBC: 1.6 10*3/uL — AB (ref 3.6–11.0)

## 2015-07-21 LAB — BASIC METABOLIC PANEL
ANION GAP: 6 (ref 5–15)
BUN: 7 mg/dL (ref 6–20)
CO2: 25 mmol/L (ref 22–32)
Calcium: 7.9 mg/dL — ABNORMAL LOW (ref 8.9–10.3)
Chloride: 103 mmol/L (ref 101–111)
Creatinine, Ser: 0.65 mg/dL (ref 0.44–1.00)
GLUCOSE: 111 mg/dL — AB (ref 65–99)
POTASSIUM: 3.7 mmol/L (ref 3.5–5.1)
Sodium: 134 mmol/L — ABNORMAL LOW (ref 135–145)

## 2015-07-21 MED ORDER — AZITHROMYCIN 250 MG PO TABS
500.0000 mg | ORAL_TABLET | ORAL | Status: DC
  Administered 2015-07-21: 19:00:00 500 mg via ORAL

## 2015-07-21 NOTE — Progress Notes (Signed)
Pharmacy Antibiotic Note  Caroline Russo is a 82 y.o. female admitted on 07/19/2015 with pneumonia.  Pharmacy has been consulted for Ceftriaxone dosing, patient is also ordered Azithromycin 500mg  IV q24h. Received Vancomycin 1g IV and aztreonam 2g IV in the ED.  Plan: Will order Ceftriaxone 1g IV q24h.  Height: 5\' 2"  (157.5 cm) Weight: 117 lb 5 oz (53.213 kg) IBW/kg (Calculated) : 50.1  Temp (24hrs), Avg:98.5 F (36.9 C), Min:98.1 F (36.7 C), Max:98.7 F (37.1 C)   Recent Labs Lab 07/15/15 1300 07/19/15 1400 07/19/15 1633 07/20/15 0418 07/21/15 0430  WBC 3.0* 2.2*  --  1.6* 1.6*  CREATININE 1.00 0.83  --  0.62 0.65  LATICACIDVEN  --  1.3 1.3  --   --     Estimated Creatinine Clearance: 34 mL/min (by C-G formula based on Cr of 0.65).    Allergies  Allergen Reactions  . Moxifloxacin Shortness Of Breath    hallucinations  . Codeine Other (See Comments)    unknown  . Prednisone     Hallucinations  . Unasyn [Ampicillin-Sulbactam Sodium] Other (See Comments)    unknown  . Penicillins Itching and Rash    Has patient had a PCN reaction causing immediate rash, facial/tongue/throat swelling, SOB or lightheadedness with hypotension: Yes Has patient had a PCN reaction causing severe rash involving mucus membranes or skin necrosis: No Has patient had a PCN reaction that required hospitalization No Has patient had a PCN reaction occurring within the last 10 years: No If all of the above answers are "NO", then may proceed with Cephalosporin use.      Antimicrobials this admission: Vancomycin x 1 dose 6/25 Aztreonam x 1 dose 6/25 Ceftriaxone 6/25 >> Azithromycin 6/25 >>  Dose adjustments this admission:  Microbiology results: BCx x2 NGTD UCx multiple spp  Thank you for allowing pharmacy to be a part of this patient's care.  Rayna Sexton, PharmD, BCPS Clinical Pharmacist 07/21/2015 7:56 AM

## 2015-07-21 NOTE — Progress Notes (Signed)
Physical Therapy Treatment Patient Details Name: Caroline Russo MRN: [id number] DOB: 07/31/1919 Today's Date: 07/21/2015    History of Present Illness Pt is a pleasant 82 y/o female that presents with increased weakness and difficulty breathing. She has a history of Hodgkin's Lymphoma and was found to have pneumonia.     PT Comments    Pt agreeable to therapy, performed supine to sit at EOB with add'l time and HOB elevated.  Sit to stand with CGA ambulating to toilet, demonstrating good safety sit to/from stand from standard toilet. Pt required x1 seated rest then able to ambulate approx 15ft x 1 with RW with x1 standing rest 2/2 fatigue however pt able to tolerate gentle seated LE therex after ambulation.  She would con't to benefit from skilled PT to improve strength for functional activity tolerance.   Follow Up Recommendations  Home health PT     Equipment Recommendations  Rolling walker with 5" wheels    Recommendations for Other Services       Precautions / Restrictions Precautions Precautions: Fall Restrictions Weight Bearing Restrictions: No    Mobility  Bed Mobility Overal bed mobility: Needs Assistance Bed Mobility: Supine to Sit     Supine to sit: Min guard     General bed mobility comments: Required add'l time, HOB elevated  Transfers Overall transfer level: Needs assistance Equipment used: Rolling walker (2 wheeled) Transfers: Sit to/from Stand Sit to Stand: Min guard         General transfer comment: Pt demonstrated good safety from bed and toilet  Ambulation/Gait Ambulation/Gait assistance: Min guard Ambulation Distance (Feet): 100 Feet Assistive device: Rolling walker (2 wheeled) Gait Pattern/deviations: Step-through pattern   Gait velocity interpretation: Below normal speed for age/gender General Gait Details: Requiring x1 standing rest break 2/2 fatigue    Stairs            Wheelchair Mobility    Modified Rankin (Stroke Patients  Only)       Balance Overall balance assessment: Needs assistance Sitting-balance support: No upper extremity supported Sitting balance-Leahy Scale: Good     Standing balance support: Bilateral upper extremity supported Standing balance-Leahy Scale: Fair                      Cognition Arousal/Alertness: Awake/alert Behavior During Therapy: WFL for tasks assessed/performed Overall Cognitive Status: Within Functional Limits for tasks assessed                      Exercises Other Exercises Other Exercises: Seated LE therex:  LAQ, hip abd, ankle pumps, hip flexion x 8-10 resp bilat    General Comments        Pertinent Vitals/Pain      Home Living                      Prior Function            PT Goals (current goals can now be found in the care plan section) Progress towards PT goals: Progressing toward goals    Frequency  Min 2X/week    PT Plan      Co-evaluation             End of Session Equipment Utilized During Treatment: Gait belt Activity Tolerance: Patient tolerated treatment well Patient left: in chair;with call bell/phone within reach;with chair alarm set;with family/visitor present     Time: 0932-1000 PT Time Calculation (min) (ACUTE ONLY): 28 min  Charges:  $Therapeutic Exercise: 8-22 mins $Therapeutic Activity: 8-22 mins                    G Codes:      Ajmal Kathan 08-06-15, 12:02 PM  Titianna Loomis, PTA

## 2015-07-21 NOTE — Care Management Important Message (Signed)
Important Message  Patient Details  Name: Caroline Russo MRN: [id number] Date of Birth: [birth year]   Medicare Important Message Given:  Yes    Juliann Pulse A Cowen Pesqueira 07/21/2015, 11:04 AM

## 2015-07-21 NOTE — Progress Notes (Addendum)
[employer] [facility] - [place] at [facility]   PATIENT NAME: Caroline Russo    MR#:  [id number]  DATE OF BIRTH:  22-Apr-1919  SUBJECTIVE:  CHIEF COMPLAINT:   Chief Complaint  Patient presents with  . Weakness  weakness, overall feeling better,  REVIEW OF SYSTEMS:  Review of Systems  Constitutional: Positive for weight loss and malaise/fatigue. Negative for fever and diaphoresis.  HENT: Negative for ear discharge, ear pain, hearing loss, nosebleeds, sore throat and tinnitus.   Eyes: Negative for blurred vision and pain.  Respiratory: Positive for shortness of breath. Negative for cough, hemoptysis and wheezing.   Cardiovascular: Negative for chest pain, palpitations, orthopnea and leg swelling.  Gastrointestinal: Negative for heartburn, nausea, vomiting, abdominal pain, diarrhea, constipation and blood in stool.  Genitourinary: Negative for dysuria, urgency and frequency.  Musculoskeletal: Negative for myalgias and back pain.  Skin: Negative for itching and rash.  Neurological: Positive for weakness. Negative for dizziness, tingling, tremors, focal weakness, seizures and headaches.  Psychiatric/Behavioral: Negative for depression. The patient is not nervous/anxious.     DRUG ALLERGIES:   Allergies  Allergen Reactions  . Moxifloxacin Shortness Of Breath    hallucinations  . Codeine Other (See Comments)    unknown  . Prednisone     Hallucinations  . Unasyn [Ampicillin-Sulbactam Sodium] Other (See Comments)    unknown  . Penicillins Itching and Rash    Has patient had a PCN reaction causing immediate rash, facial/tongue/throat swelling, SOB or lightheadedness with hypotension: Yes Has patient had a PCN reaction causing severe rash involving mucus membranes or skin necrosis: No Has patient had a PCN reaction that required hospitalization No Has patient had a PCN reaction occurring within the last 10 years: No If all of the above answers are "NO", then may proceed  with Cephalosporin use.     VITALS:  Blood pressure 145/46, pulse 73, temperature 98.6 F (37 C), temperature source Oral, resp. rate 20, height 5\' 2"  (1.575 m), weight 53.213 kg (117 lb 5 oz), SpO2 99 %. PHYSICAL EXAMINATION:  Physical Exam  Constitutional: She is oriented to person, place, and time. She appears malnourished. She appears cachectic.  HENT:  Head: Normocephalic and atraumatic.  Eyes: Conjunctivae and EOM are normal. Pupils are equal, round, and reactive to light.  Neck: Normal range of motion. Neck supple. No tracheal deviation present. No thyromegaly present.  Cardiovascular: Normal rate, regular rhythm and normal heart sounds.   Pulmonary/Chest: Effort normal and breath sounds normal. No respiratory distress. She has no wheezes. She exhibits no tenderness.  Abdominal: Soft. Bowel sounds are normal. She exhibits no distension. There is no tenderness.  Musculoskeletal: Normal range of motion.  Neurological: She is alert and oriented to person, place, and time. No cranial nerve deficit.  Skin: Skin is warm and dry. No rash noted.  Psychiatric: Mood and affect normal.   LABORATORY PANEL:   CBC  Recent Labs Lab 07/21/15 0430  WBC 1.6*  HGB 10.4*  HCT 30.5*  PLT 219   ------------------------------------------------------------------------------------------------------------------ Chemistries   Recent Labs Lab 07/15/15 1300 07/19/15 1400  07/21/15 0430  NA 130* 130*  < > 134*  K 3.9 3.9  < > 3.7  CL 95* 94*  < > 103  CO2 26 27  < > 25  GLUCOSE 188* 161*  < > 111*  BUN 16 12  < > 7  CREATININE 1.00 0.83  < > 0.65  CALCIUM 8.4* 8.2*  < > 7.9*  MG 1.6*  --   --   --  AST 79* 31  --   --   ALT 61* 28  --   --   ALKPHOS 129* 100  --   --   BILITOT 0.7 0.7  --   --   < > = values in this interval not displayed. RADIOLOGY:  Ct Chest Wo Contrast  07/20/2015  CLINICAL DATA:  Cough. Chest x-ray earlier today showing persistent bibasilar atelectasis and/or  infiltrates and pleural effusions. EXAM: CT CHEST WITHOUT CONTRAST TECHNIQUE: Multidetector CT imaging of the chest was performed following the standard protocol without IV contrast. COMPARISON:  Chest x-ray from earlier same day. FINDINGS: Mediastinum/Lymph Nodes: Prominent atherosclerotic changes of the normal caliber thoracic aorta. Prominent mitral annulus calcifications. Scattered coronary artery calcifications. Heart size is normal. No pericardial effusion. Lungs/Pleura: Bilateral pleural effusions, moderate in size. Patchy consolidations at each lung base, left greater than right. Right-sided chest tube terminating medially at the azygoesophageal recess. Upper abdomen: No acute findings. Musculoskeletal: Degenerative changes throughout the thoracolumbar spine. No acute or suspicious osseous lesion. IMPRESSION: 1. Moderate-sized pleural effusions bilaterally. 2. Bibasilar consolidations, left greater than right. The left lower lobe consolidation is likely a combination of pneumonia and atelectasis. Smaller consolidation at the right lung base is likely compressive atelectasis. 3. Right-sided chest tube in place, terminating medially adjacent to the azygoesophageal recess. 4. Aortic atherosclerosis. Electronically Signed   By: Bary Richard M.D.   On: 07/20/2015 16:42   ASSESSMENT AND PLAN:  82 year old female with Hodgkin's lymphoma who presents with generalized weakness and persistent cough.  1. Hyponatremia: Improving with IV hydration.  - Sodium went up from 130 to 134  2. Generalized weakness: This is likely multifactorial with combination of hyponatremia, poor by mouth intake, underlying cancer and persistent cough. - Her daughter reports weight loss of about at least 40 pounds over last couple months, especially since she does not have her normal teeth and has dentures.  She is not eating much  3. Pneumonia: Patient may have underlying pneumonia which is not clearly visualized on chest x-ray  due to dehydration. Patient will empirically be treated with Rocephin and azithromycin. She reports that rash with penicillin. - CT chest shows b/l PNA (lt > rt). Also has moderate B/L effusion  - d/w nursing to drain rt plurx cathetar  4. Hodgkin's lymphoma: Pending palliative care and oncology consult. Patient has baseline leukopenia. - Neutropenic precautions  5.  Hypokalemia - Replete and recheck  6. Severe chronic Protein-calorie malnutrition - due to long standing poor PO intake   All the records are reviewed and case discussed with Care Management/Social Worker. Management plans discussed with the patient, family and they are in agreement.  CODE STATUS: DO NOT RESUSCITATE  TOTAL TIME TAKING CARE OF THIS PATIENT: 35 minutes.   More than 50% of the time was spent in counseling/coordination of care: YES (case discussed with patient's daughter at bedside who is in agreement with palliative care and/or hospice but doesn't want to decide here in Hospital anymore and says they will decide as an outpt.  Patient does not want to go chemotherapy again)  POSSIBLE D/C IN AM, DEPENDING ON CLINICAL CONDITION.   [facility], Rayford Williamsen M.D on 07/21/2015 at 3:35 PM  Between 7am to 6pm - Pager - [phone]  After 6pm go to [email or web address] - Social research officer, government  [facility] [place] [facility]  Office  [phone]  CC: Primary care physician; Fidel Levy, MD  Note: This dictation was prepared with Dragon dictation along with smaller phrase technology. Any  transcriptional errors that result from this process are unintentional.

## 2015-07-22 MED ORDER — ENSURE ENLIVE PO LIQD: 237.0000 mL | Freq: Three times a day (TID) | ORAL | Status: AC

## 2015-07-22 MED ORDER — AZITHROMYCIN 500 MG PO TABS: 500.0000 mg | ORAL_TABLET | Freq: Every day | ORAL | Status: DC

## 2015-07-22 MED ORDER — HEPARIN SOD (PORK) LOCK FLUSH 100 UNIT/ML IV SOLN
500.0000 [IU] | Freq: Once | INTRAVENOUS | Status: AC
  Administered 2015-07-22: 11:00:00 500 [IU] via INTRAVENOUS
  Filled 2015-07-22: qty 5

## 2015-07-22 NOTE — Care Management (Signed)
Discharge to home today per Dr. Manuella Ghazi. Will decide about Home care services at a later date in the home. Family will transport. Shelbie Ammons RN MSN CCM Care Management [phone]

## 2015-07-22 NOTE — Discharge Instructions (Signed)

## 2015-07-23 ENCOUNTER — Inpatient Hospital Stay: Payer: Medicare Other

## 2015-07-23 ENCOUNTER — Inpatient Hospital Stay: Payer: Medicare Other | Admitting: Internal Medicine

## 2015-07-23 ENCOUNTER — Telehealth: Payer: Self-pay | Admitting: *Deleted

## 2015-07-23 NOTE — Telephone Encounter (Signed)
Paperwork has been faxed for this patient.  Please fax back when completed. [phone]

## 2015-07-23 NOTE — Discharge Summary (Signed)
[facility] - [place] at [facility]   PATIENT NAME: Caroline Russo    MR#:  [id number]  DATE OF BIRTH:  27-Dec-1919  DATE OF ADMISSION:  07/19/2015 ADMITTING PHYSICIAN: Adrian Saran, MD  DATE OF DISCHARGE: 07/22/2015 11:52 AM  PRIMARY CARE PHYSICIAN: Fidel Levy, MD    ADMISSION DIAGNOSIS:  Cancer Patient sent by Doctor Hyponatremia Generalized weakness DISCHARGE DIAGNOSIS:  Active Problems:   Hyponatremia   Cough   Fever   Pleural effusion   Follicular lymphoma (HCC)   Diastolic CHF, chronic (HCC)   Protein-calorie malnutrition, severe  SECONDARY DIAGNOSIS:   Past Medical History  Diagnosis Date  . HTN (hypertension)   . DM2 (diabetes mellitus, type 2) (HCC)     insulin requiring  . Hypercholesterolemia   . Murmur     hx  . Tinnitus     chronic  . Macular degeneration   . Chronic chest pain     a. nonobs cath 2002.  Marland Kitchen Palpitations     a. 01/2011 Holter monitor showed frequent PACs, sinus bradycardia  . Anxiety   . Depression   . Irregular cardiac rhythm   . Heart murmur     a. 09/2014 Echo: EF 55-60%, nl wm, mod MS, mild MR, mod dil LA, mild-mod TR.  . Cataract   . Non Hodgkin's lymphoma (HCC)   . Hypomagnesemia 06/13/2014  . Non-obstructive CAD   . Orthostatic hypotension     HOSPITAL COURSE:  82 year old female with Hodgkin's lymphoma admitted with generalized weakness and persistent cough.  1. Hyponatremia: Improving with IV hydration.  - Sodium went up from 130 to 134  2. Generalized weakness: This is likely multifactorial with combination of hyponatremia, poor by mouth intake, underlying cancer and persistent cough. - Her daughter reports weight loss of about at least 40 pounds over last couple months, especially since she does not have her normal teeth and has dentures. Poor PO intake  3. Pneumonia: CT chest confirmed b/l PNA (lt > rt). Also has moderate B/L effusion  - drained 500 cc via rt plurx cathetar  4.  Hodgkin's lymphoma: outpt oncology f/up Patient has baseline leukopenia. - Neutropenic precautions  5. Hypokalemia - Repleted  6. Severe chronic Protein-calorie malnutrition - due to long standing poor PO intake  Family to decide on palliative care/Hospice eval as an outpt DISCHARGE CONDITIONS:   stable  CONSULTS OBTAINED:  Treatment Team:  Antonieta Iba, MD Earna Coder, MD  DRUG ALLERGIES:   Allergies  Allergen Reactions  . Moxifloxacin Shortness Of Breath    hallucinations  . Codeine Other (See Comments)    unknown  . Prednisone     Hallucinations  . Unasyn [Ampicillin-Sulbactam Sodium] Other (See Comments)    unknown  . Penicillins Itching and Rash    Has patient had a PCN reaction causing immediate rash, facial/tongue/throat swelling, SOB or lightheadedness with hypotension: Yes Has patient had a PCN reaction causing severe rash involving mucus membranes or skin necrosis: No Has patient had a PCN reaction that required hospitalization No Has patient had a PCN reaction occurring within the last 10 years: No If all of the above answers are "NO", then may proceed with Cephalosporin use.      DISCHARGE MEDICATIONS:   Discharge Medication List as of 07/22/2015 11:12 AM    START taking these medications   Details  azithromycin (ZITHROMAX) 500 MG tablet Take 1 tablet (500 mg total) by mouth daily., Starting 07/22/2015, Until Discontinued, Normal  feeding supplement, ENSURE ENLIVE, (ENSURE ENLIVE) LIQD Take 237 mLs by mouth 3 (three) times daily between meals., Starting 07/22/2015, Until Discontinued, Normal      CONTINUE these medications which have NOT CHANGED   Details  ALPRAZolam (XANAX) 0.25 MG tablet Take 1 tablet (0.25 mg total) by mouth every 6 (six) hours as needed., Starting 11/05/2014, Until Discontinued, Print    benzonatate (TESSALON) 100 MG capsule Take 1 capsule (100 mg total) by mouth 3 (three) times daily as needed for cough., Starting  07/07/2015, Until Discontinued, Normal    cyclobenzaprine (FLEXERIL) 10 MG tablet Take 10 mg by mouth 2 (two) times daily as needed for muscle spasms. Reported on 07/15/2015, Until Discontinued, Historical Med    DOK 100 MG capsule TAKE 1 CAPSULE(100 MG) BY MOUTH TWICE DAILY, Normal    fluticasone (FLONASE) 50 MCG/ACT nasal spray Place 1 spray into both nostrils daily., Starting 05/07/2015, Until Discontinued, Normal    lidocaine-prilocaine (EMLA) cream Apply 1 application topically as needed., Starting 07/18/2014, Until Discontinued, Normal    losartan (COZAAR) 50 MG tablet TAKE 1 TABLET BY MOUTH DAILY AFTER BREAKFAST., Normal    meclizine (ANTIVERT) 12.5 MG tablet Take 12.5 mg by mouth 3 (three) times daily as needed., Until Discontinued, Historical Med    mirtazapine (REMERON) 15 MG tablet TAKE 1 TABLET(15 MG) BY MOUTH AT BEDTIME, Normal    Multiple Vitamin (MULTIVITAMIN WITH MINERALS) TABS tablet Take 1 tablet by mouth daily., Until Discontinued, Historical Med    nitroGLYCERIN (NITROSTAT) 0.4 MG SL tablet Place 0.4 mg under the tongue every 5 (five) minutes x 3 doses as needed for chest pain. Reported on 07/15/2015, Until Discontinued, Historical Med    pantoprazole (PROTONIX) 40 MG tablet Take 1 tablet (40 mg total) by mouth daily., Starting 06/17/2015, Until Discontinued, Normal    polyethylene glycol powder (GLYCOLAX/MIRALAX) powder Take 17 g by mouth daily as needed., Starting 02/17/2015, Until Discontinued, Normal    SLOW-MAG 71.5-119 MG TBEC SR tablet TAKE 1 TABLET (64 MG TOTAL) BY MOUTH 2 (TWO) TIMES DAILY., Normal    traMADol (ULTRAM) 50 MG tablet Take 1 tablet by mouth every 4-6 hours as needed for pain, Print      STOP taking these medications     cephALEXin (KEFLEX) 500 MG capsule          DISCHARGE INSTRUCTIONS:    DIET:  Regular diet  DISCHARGE CONDITION:  Good  ACTIVITY:  Activity as tolerated  OXYGEN:  Home Oxygen: No.   Oxygen Delivery: room  air  DISCHARGE LOCATION:  home   If you experience worsening of your admission symptoms, develop shortness of breath, life threatening emergency, suicidal or homicidal thoughts you must seek medical attention immediately by calling 911 or calling your MD immediately  if symptoms less severe.  You Must read complete instructions/literature along with all the possible adverse reactions/side effects for all the Medicines you take and that have been prescribed to you. Take any new Medicines after you have completely understood and accpet all the possible adverse reactions/side effects.   Please note  You were cared for by a hospitalist during your hospital stay. If you have any questions about your discharge medications or the care you received while you were in the hospital after you are discharged, you can call the unit and asked to speak with the hospitalist on call if the hospitalist that took care of you is not available. Once you are discharged, your primary care physician will handle any further medical  issues. Please note that NO REFILLS for any discharge medications will be authorized once you are discharged, as it is imperative that you return to your primary care physician (or establish a relationship with a primary care physician if you do not have one) for your aftercare needs so that they can reassess your need for medications and monitor your lab values.    On the day of Discharge:  VITAL SIGNS:  Blood pressure 138/62, pulse 77, temperature 97.5 F (36.4 C), temperature source Oral, resp. rate 18, height 5\' 2"  (1.575 m), weight 53.213 kg (117 lb 5 oz), SpO2 96 %. PHYSICAL EXAMINATION:  GENERAL:  82 y.o.-year-old patient lying in the bed with no acute distress.  EYES: Pupils equal, round, reactive to light and accommodation. No scleral icterus. Extraocular muscles intact.  HEENT: Head atraumatic, normocephalic. Oropharynx and nasopharynx clear.  NECK:  Supple, no jugular venous  distention. No thyroid enlargement, no tenderness.  LUNGS: Normal breath sounds bilaterally, no wheezing, rales,rhonchi or crepitation. No use of accessory muscles of respiration.  CARDIOVASCULAR: S1, S2 normal. No murmurs, rubs, or gallops.  ABDOMEN: Soft, non-tender, non-distended. Bowel sounds present. No organomegaly or mass.  EXTREMITIES: No pedal edema, cyanosis, or clubbing.  NEUROLOGIC: Cranial nerves II through XII are intact. Muscle strength 5/5 in all extremities. Sensation intact. Gait not checked.  PSYCHIATRIC: The patient is alert and oriented x 3.  SKIN: No obvious rash, lesion, or ulcer.  DATA REVIEW:   CBC  Recent Labs Lab 07/21/15 0430  WBC 1.6*  HGB 10.4*  HCT 30.5*  PLT 219    Chemistries   Recent Labs Lab 07/19/15 1400  07/21/15 0430  NA 130*  < > 134*  K 3.9  < > 3.7  CL 94*  < > 103  CO2 27  < > 25  GLUCOSE 161*  < > 111*  BUN 12  < > 7  CREATININE 0.83  < > 0.65  CALCIUM 8.2*  < > 7.9*  AST 31  --   --   ALT 28  --   --   ALKPHOS 100  --   --   BILITOT 0.7  --   --   < > = values in this interval not displayed.  Follow-up Information    Follow up with Fidel Levy, MD. Go on 08/13/2015.   Specialty:  Family Medicine   Why:  at 2:45 p.m. [facility] Discharge F/UP   Contact information:   [address] [place] [address] [phone]       Follow up with Earna Coder, MD. Schedule an appointment as soon as possible for a visit on 07/24/2015.   Specialties:  Internal Medicine, Oncology   Why:  at 3:30 p.m. [facility] Discharge F/UP   Contact information:   [address] [place] [address] [phone]       Management plans discussed with the patient, family and they are in agreement.  CODE STATUS: DNR  TOTAL TIME TAKING CARE OF THIS PATIENT: 50 minutes.    [facility], Vlad Mayberry M.D on 07/23/2015 at 11:25 PM  Between 7am to 6pm - Pager - [phone]  After 6pm go to [email or web address] - password [email or web address] [facility]  [place]  [place] [facility]  Office  [phone]  CC: Primary care physician; Fidel Levy, MD   Note: This dictation was prepared with Dragon dictation along with smaller phrase technology. Any transcriptional errors that result from this process are unintentional.

## 2015-07-24 ENCOUNTER — Inpatient Hospital Stay (HOSPITAL_BASED_OUTPATIENT_CLINIC_OR_DEPARTMENT_OTHER): Payer: Medicare Other | Admitting: Internal Medicine

## 2015-07-24 ENCOUNTER — Other Ambulatory Visit: Payer: Self-pay | Admitting: *Deleted

## 2015-07-24 ENCOUNTER — Inpatient Hospital Stay: Payer: Medicare Other

## 2015-07-24 VITALS — BP 109/61 | HR 77 | Temp 97.4°F | Wt 113.8 lb

## 2015-07-24 DIAGNOSIS — R002 Palpitations: Secondary | ICD-10-CM

## 2015-07-24 DIAGNOSIS — Z9221 Personal history of antineoplastic chemotherapy: Secondary | ICD-10-CM

## 2015-07-24 DIAGNOSIS — Z794 Long term (current) use of insulin: Secondary | ICD-10-CM

## 2015-07-24 DIAGNOSIS — Z8052 Family history of malignant neoplasm of bladder: Secondary | ICD-10-CM

## 2015-07-24 DIAGNOSIS — J069 Acute upper respiratory infection, unspecified: Secondary | ICD-10-CM | POA: Diagnosis not present

## 2015-07-24 DIAGNOSIS — R109 Unspecified abdominal pain: Secondary | ICD-10-CM | POA: Diagnosis not present

## 2015-07-24 DIAGNOSIS — I499 Cardiac arrhythmia, unspecified: Secondary | ICD-10-CM | POA: Diagnosis not present

## 2015-07-24 DIAGNOSIS — R011 Cardiac murmur, unspecified: Secondary | ICD-10-CM

## 2015-07-24 DIAGNOSIS — Z800 Family history of malignant neoplasm of digestive organs: Secondary | ICD-10-CM | POA: Diagnosis not present

## 2015-07-24 DIAGNOSIS — E7800 Pure hypercholesterolemia, unspecified: Secondary | ICD-10-CM | POA: Diagnosis not present

## 2015-07-24 DIAGNOSIS — E119 Type 2 diabetes mellitus without complications: Secondary | ICD-10-CM | POA: Diagnosis not present

## 2015-07-24 DIAGNOSIS — C8110 Nodular sclerosis classical Hodgkin lymphoma, unspecified site: Secondary | ICD-10-CM

## 2015-07-24 DIAGNOSIS — R630 Anorexia: Secondary | ICD-10-CM | POA: Insufficient documentation

## 2015-07-24 DIAGNOSIS — R634 Abnormal weight loss: Secondary | ICD-10-CM | POA: Diagnosis not present

## 2015-07-24 DIAGNOSIS — I10 Essential (primary) hypertension: Secondary | ICD-10-CM | POA: Diagnosis not present

## 2015-07-24 DIAGNOSIS — Z79899 Other long term (current) drug therapy: Secondary | ICD-10-CM | POA: Diagnosis not present

## 2015-07-24 DIAGNOSIS — Z87891 Personal history of nicotine dependence: Secondary | ICD-10-CM | POA: Diagnosis not present

## 2015-07-24 DIAGNOSIS — Z801 Family history of malignant neoplasm of trachea, bronchus and lung: Secondary | ICD-10-CM | POA: Diagnosis not present

## 2015-07-24 DIAGNOSIS — J90 Pleural effusion, not elsewhere classified: Secondary | ICD-10-CM | POA: Diagnosis not present

## 2015-07-24 DIAGNOSIS — F418 Other specified anxiety disorders: Secondary | ICD-10-CM

## 2015-07-24 DIAGNOSIS — C8232 Follicular lymphoma grade IIIa, intrathoracic lymph nodes: Secondary | ICD-10-CM | POA: Diagnosis not present

## 2015-07-24 DIAGNOSIS — H3530 Unspecified macular degeneration: Secondary | ICD-10-CM | POA: Diagnosis not present

## 2015-07-24 DIAGNOSIS — D709 Neutropenia, unspecified: Secondary | ICD-10-CM | POA: Diagnosis not present

## 2015-07-24 DIAGNOSIS — C8213 Follicular lymphoma grade II, intra-abdominal lymph nodes: Secondary | ICD-10-CM | POA: Insufficient documentation

## 2015-07-24 DIAGNOSIS — Z452 Encounter for adjustment and management of vascular access device: Secondary | ICD-10-CM | POA: Diagnosis not present

## 2015-07-24 LAB — CBC WITH DIFFERENTIAL/PLATELET
Basophils Absolute: 0 10*3/uL (ref 0–0.1)
Basophils Relative: 2 %
EOS ABS: 0.2 10*3/uL (ref 0–0.7)
HCT: 34 % — ABNORMAL LOW (ref 35.0–47.0)
Hemoglobin: 11.4 g/dL — ABNORMAL LOW (ref 12.0–16.0)
Lymphocytes Relative: 47 %
Lymphs Abs: 1 10*3/uL (ref 1.0–3.6)
MCH: 27.8 pg (ref 26.0–34.0)
MCHC: 33.5 g/dL (ref 32.0–36.0)
MCV: 83.2 fL (ref 80.0–100.0)
Monocytes Absolute: 0.3 10*3/uL (ref 0.2–0.9)
NEUTROS ABS: 0.6 10*3/uL — AB (ref 1.4–6.5)
Neutrophils Relative %: 28 %
PLATELETS: 272 10*3/uL (ref 150–440)
RBC: 4.09 MIL/uL (ref 3.80–5.20)
RDW: 15.5 % — ABNORMAL HIGH (ref 11.5–14.5)
WBC: 2.1 10*3/uL — ABNORMAL LOW (ref 3.6–11.0)

## 2015-07-24 LAB — COMPREHENSIVE METABOLIC PANEL
ALT: 21 U/L (ref 14–54)
AST: 28 U/L (ref 15–41)
Albumin: 2.8 g/dL — ABNORMAL LOW (ref 3.5–5.0)
Alkaline Phosphatase: 108 U/L (ref 38–126)
Anion gap: 5 (ref 5–15)
BUN: 10 mg/dL (ref 6–20)
CALCIUM: 8.6 mg/dL — AB (ref 8.9–10.3)
CHLORIDE: 100 mmol/L — AB (ref 101–111)
CO2: 31 mmol/L (ref 22–32)
CREATININE: 0.68 mg/dL (ref 0.44–1.00)
Glucose, Bld: 170 mg/dL — ABNORMAL HIGH (ref 65–99)
Potassium: 4.6 mmol/L (ref 3.5–5.1)
Sodium: 136 mmol/L (ref 135–145)
TOTAL PROTEIN: 6 g/dL — AB (ref 6.5–8.1)
Total Bilirubin: 0.3 mg/dL (ref 0.3–1.2)

## 2015-07-24 LAB — CULTURE, BLOOD (ROUTINE X 2)
CULTURE: NO GROWTH
CULTURE: NO GROWTH

## 2015-07-24 LAB — MAGNESIUM: MAGNESIUM: 1.8 mg/dL (ref 1.7–2.4)

## 2015-07-24 LAB — PATHOLOGIST SMEAR REVIEW

## 2015-07-24 MED ORDER — DRONABINOL 2.5 MG PO CAPS: 2.5000 mg | ORAL_CAPSULE | Freq: Two times a day (BID) | ORAL | Status: DC

## 2015-07-24 NOTE — Progress Notes (Signed)
[facility] OFFICE PROGRESS NOTE  Patient Care Team: Arlis Porta., MD as PCP - General (Unknown Physician Specialty) Minna Merritts, MD as Consulting Physician (Cardiology)  No matching staging information was found for the patient.   Oncology History   # 2015- Biopsy of para-aortic lymph node is consistent with follicular lymphoma, clinically stage as 3A Diagnosis in July of 2015. Started on rituximab on July 31, 2013 2. Started on [place]  and Rituxan on November 04, 2013 3 maintenance rituximab started from April of 2016  # Mod AS/       Follicular lymphoma grade II of intra-abdominal lymph nodes ([place])   07/24/2015 Initial Diagnosis Follicular lymphoma grade II of intra-abdominal lymph nodes (HCC)    INTERVAL HISTORY:  Caroline Russo 82 y.o.  female pleasant patient above history of Recurrent follicular lymphoma/ also a right pleural effusion presumed from lymphoma is here for follow-up  Patient was recently admitted to the hospital for cough worsening shortness of breath- question viral syndrome. Patient was also fluid overloaded/ as she received fluids in the emergency room. Patient is currently discharged home; currently stable. Patient also had close catheter drained 500 cc while in the hospital.  Patient denies any fevers. Denies any nausea vomiting. She has mild pain in her right chest wall/Pleurx catheter area. Poor appetite. Positive for weight loss.   REVIEW OF SYSTEMS:  A complete 10 point review of system is done which is negative except mentioned above/history of present illness.   PAST MEDICAL HISTORY :  Past Medical History  Diagnosis Date  . HTN (hypertension)   . DM2 (diabetes mellitus, type 2) (HCC)     insulin requiring  . Hypercholesterolemia   . Murmur     hx  . Tinnitus     chronic  . Macular degeneration   . Chronic chest pain     a. nonobs cath 2002.  Marland Kitchen Palpitations     a. 01/2011 Holter monitor showed frequent PACs,  sinus bradycardia  . Anxiety   . Depression   . Irregular cardiac rhythm   . Heart murmur     a. 09/2014 Echo: EF 55-60%, nl wm, mod MS, mild MR, mod dil LA, mild-mod TR.  . Cataract   . Non Hodgkin's lymphoma ([place])   . Hypomagnesemia 06/13/2014  . Non-obstructive CAD   . Orthostatic hypotension     PAST SURGICAL HISTORY :   Past Surgical History  Procedure Laterality Date  . Cardiac catheterization  2002  . Tumor removal  2012    benign neck tumor removed  . Tooth extraction    . Knee surgery Left   . Ankle surgery Left   . Partial hysterectomy    . Insertion / placement pleural catheter      FAMILY HISTORY :   Family History  Problem Relation Age of Onset  . Heart disease Mother   . Lung cancer Brother     smoked  . Bladder Cancer Brother   . Stomach cancer Brother     SOCIAL HISTORY:   Social History  Substance Use Topics  . Smoking status: Former Smoker -- 0.25 packs/day for 7 years    Types: Cigarettes    Quit date: 01/25/1988  . Smokeless tobacco: Never Used  . Alcohol Use: No    ALLERGIES:  is allergic to moxifloxacin; codeine; prednisone; unasyn; and penicillins.  MEDICATIONS:  Current Outpatient Prescriptions  Medication Sig Dispense Refill  . ALPRAZolam (XANAX) 0.25 MG tablet  Take 1 tablet (0.25 mg total) by mouth every 6 (six) hours as needed. 30 tablet 1  . azithromycin (ZITHROMAX) 500 MG tablet Take 1 tablet (500 mg total) by mouth daily. 5 tablet 0  . benzonatate (TESSALON) 100 MG capsule Take 1 capsule (100 mg total) by mouth 3 (three) times daily as needed for cough. 30 capsule 0  . cyclobenzaprine (FLEXERIL) 10 MG tablet Take 10 mg by mouth 2 (two) times daily as needed for muscle spasms. Reported on 07/15/2015    . DOK 100 MG capsule TAKE 1 CAPSULE(100 MG) BY MOUTH TWICE DAILY 60 capsule 0  . feeding supplement, ENSURE ENLIVE, (ENSURE ENLIVE) LIQD Take 237 mLs by mouth 3 (three) times daily between meals. 237 mL 12  . fluticasone (FLONASE) 50  MCG/ACT nasal spray Place 1 spray into both nostrils daily. (Patient taking differently: Place 2 sprays into both nostrils daily. ) 16 g 6  . lidocaine-prilocaine (EMLA) cream Apply 1 application topically as needed. 30 g 3  . meclizine (ANTIVERT) 12.5 MG tablet Take 12.5 mg by mouth 3 (three) times daily as needed.    . mirtazapine (REMERON) 15 MG tablet TAKE 1 TABLET(15 MG) BY MOUTH AT BEDTIME (Patient taking differently: TAKE 1 TABLET(15 MG) BY MOUTH AT BEDTIME AS NEEDED FOR SLEEP.) 90 tablet 1  . Multiple Vitamin (MULTIVITAMIN WITH MINERALS) TABS tablet Take 1 tablet by mouth daily.    . nitroGLYCERIN (NITROSTAT) 0.4 MG SL tablet Place 0.4 mg under the tongue every 5 (five) minutes x 3 doses as needed for chest pain. Reported on 07/15/2015    . pantoprazole (PROTONIX) 40 MG tablet Take 1 tablet (40 mg total) by mouth daily. (Patient taking differently: Take 40 mg by mouth daily as needed. FOR HEARTBURN/INDIGESTION.) 30 tablet 6  . polyethylene glycol powder (GLYCOLAX/MIRALAX) powder Take 17 g by mouth daily as needed. 3350 g 0  . SLOW-MAG 71.5-119 MG TBEC SR tablet TAKE 1 TABLET (64 MG TOTAL) BY MOUTH 2 (TWO) TIMES DAILY. 60 tablet 3  . traMADol (ULTRAM) 50 MG tablet Take 1 tablet by mouth every 4-6 hours as needed for pain 90 tablet 0  . dronabinol (MARINOL) 2.5 MG capsule Take 1 capsule (2.5 mg total) by mouth 2 (two) times daily before a meal. 60 capsule 3  . losartan (COZAAR) 50 MG tablet TAKE 1 TABLET BY MOUTH DAILY AFTER BREAKFAST. (Patient taking differently: TAKE 25 MG BY MOUTH DAILY AFTER BREAKFAST.) 90 tablet 3   No current facility-administered medications for this visit.   Facility-Administered Medications Ordered in Other Visits  Medication Dose Route Frequency Provider Last Rate Last Dose  . heparin lock flush 100 unit/mL  500 Units Intravenous Once Forest Gleason, MD   500 Units at 07/24/14 1630  . sodium chloride 0.9 % injection 10 mL  10 mL Intravenous PRN Forest Gleason, MD   10 mL  at 07/24/14 1020  . sodium chloride 0.9 % injection 10 mL  10 mL Intravenous PRN Forest Gleason, MD   10 mL at 08/12/14 0834    PHYSICAL EXAMINATION: ECOG PERFORMANCE STATUS: 2 - Symptomatic, <50% confined to bed  BP 109/61 mmHg  Pulse 77  Temp(Src) 97.4 F (36.3 C) (Tympanic)  Wt 113 lb 12.1 oz (51.6 kg)  Filed Weights   07/24/15 1554  Weight: 113 lb 12.1 oz (51.6 kg)    GENERAL: [place] Caucasian female patient; Alert, no distress and comfortable.  Accompanied by her daughter in a wheelchair  EYES: no pallor or icterus OROPHARYNX:  no thrush or ulceration; dentures NECK: supple, no masses felt LYMPH:  no palpable lymphadenopathy in the cervical, axillary or inguinal regions LUNGS: Decreased breath sounds on the right side compared to the left  No wheeze or crackles HEART/CVS: regular rate & rhythm and no murmurs; No lower extremity edema ABDOMEN:abdomen soft, non-tender and normal bowel sounds Musculoskeletal:no cyanosis of digits and no clubbing  PSYCH: alert & oriented x 3 with fluent speech NEURO: no focal motor/sensory deficits SKIN:  no rashes or significant lesions  LABORATORY DATA:  I have reviewed the data as listed    Component Value Date/Time   NA 136 07/24/2015 1500   NA 134* 05/15/2014 0855   NA 137 02/08/2012 1000   K 4.6 07/24/2015 1500   K 3.3* 05/15/2014 0855   CL 100* 07/24/2015 1500   CL 101 05/15/2014 0855   CO2 31 07/24/2015 1500   CO2 27 05/15/2014 0855   GLUCOSE 170* 07/24/2015 1500   GLUCOSE 213* 05/15/2014 0855   GLUCOSE 256* 02/08/2012 1000   BUN 10 07/24/2015 1500   BUN 9 05/15/2014 0855   BUN 15 02/08/2012 1000   CREATININE 0.68 07/24/2015 1500   CREATININE 0.75 05/15/2014 0855   CREATININE 0.82 04/22/2014   CALCIUM 8.6* 07/24/2015 1500   CALCIUM 8.1* 05/15/2014 0855   PROT 6.0* 07/24/2015 1500   PROT 5.3* 05/15/2014 0855   ALBUMIN 2.8* 07/24/2015 1500   ALBUMIN 2.7* 05/15/2014 0855   AST 28 07/24/2015 1500   AST 31  05/15/2014 0855   ALT 21 07/24/2015 1500   ALT 14 05/15/2014 0855   ALKPHOS 108 07/24/2015 1500   ALKPHOS 104 05/15/2014 0855   BILITOT 0.3 07/24/2015 1500   BILITOT 0.6 05/15/2014 0855   GFRNONAA >60 07/24/2015 1500   GFRNONAA >60 05/15/2014 0855   GFRNONAA 56* 03/03/2014 1339   GFRAA >60 07/24/2015 1500   GFRAA >60 05/15/2014 0855   GFRAA >60 03/03/2014 1339    No results found for: SPEP, UPEP  Lab Results  Component Value Date   WBC 2.1* 07/24/2015   NEUTROABS 0.6* 07/24/2015   HGB 11.4* 07/24/2015   HCT 34.0* 07/24/2015   MCV 83.2 07/24/2015   PLT 272 07/24/2015      Chemistry      Component Value Date/Time   NA 136 07/24/2015 1500   NA 134* 05/15/2014 0855   NA 137 02/08/2012 1000   K 4.6 07/24/2015 1500   K 3.3* 05/15/2014 0855   CL 100* 07/24/2015 1500   CL 101 05/15/2014 0855   CO2 31 07/24/2015 1500   CO2 27 05/15/2014 0855   BUN 10 07/24/2015 1500   BUN 9 05/15/2014 0855   BUN 15 02/08/2012 1000   CREATININE 0.68 07/24/2015 1500   CREATININE 0.75 05/15/2014 0855   CREATININE 0.82 04/22/2014      Component Value Date/Time   CALCIUM 8.6* 07/24/2015 1500   CALCIUM 8.1* 05/15/2014 0855   ALKPHOS 108 07/24/2015 1500   ALKPHOS 104 05/15/2014 0855   AST 28 07/24/2015 1500   AST 31 05/15/2014 0855   ALT 21 07/24/2015 1500   ALT 14 05/15/2014 0855   BILITOT 0.3 07/24/2015 1500   BILITOT 0.6 05/15/2014 0855       RADIOGRAPHIC STUDIES: I have personally reviewed the radiological images as listed and agreed with the findings in the report. No results found.   ASSESSMENT & PLAN:  Follicular lymphoma grade II of intra-abdominal lymph nodes ([place]) # Follicular lymphoma G-2  With recurrence- and  right external iliac lymph node  [May 2017 on PET scan]; however recurrent pleural effusion-possibly related to lymphoma. Recommend resuming Rituxan on a monthly basis. Start next week  # right pleural effusion- ? Related to lymphoma- recommend draining the  catheter if symptomatic.  # Neutropenia- ANC 800 recently; [place] from today is pending. Question medication versus bone marrow infiltration. Monitor for now  # Hypomagnesemia-oral supplementation 1.8 today  # Cachexia poor appetite- weight loss recommend Marinol.   # Start Rituxan next week; follow up with me in approximately 5 weeks CBC CMP and magnesium/Rituxan  infusion.     Orders Placed This Encounter  Procedures  . CBC with Differential    Standing Status: Future     Number of Occurrences:      Standing Expiration Date: 07/23/2016  . Comprehensive metabolic panel    Standing Status: Future     Number of Occurrences:      Standing Expiration Date: 07/23/2016    Order Specific Question:  Has the patient fasted?    Answer:  No  . Lactate dehydrogenase    Standing Status: Future     Number of Occurrences:      Standing Expiration Date: 07/23/2016  . Magnesium    Standing Status: Future     Number of Occurrences:      Standing Expiration Date: 07/23/2016   All questions were answered. The patient knows to call the clinic with any problems, questions or concerns.      Cammie Sickle, MD 07/24/2015 4:45 PM

## 2015-07-24 NOTE — Assessment & Plan Note (Addendum)
#   Follicular lymphoma G-2  With recurrence- and right external iliac lymph node  [May 2017 on PET scan]; however recurrent pleural effusion-possibly related to lymphoma. Recommend resuming Rituxan on a monthly basis. Start next week  # right pleural effusion- ? Related to lymphoma- recommend draining the catheter if symptomatic.  # Neutropenia- ANC 800 recently; [place] from today is pending. Question medication versus bone marrow infiltration. Monitor for now  # Hypomagnesemia-oral supplementation 1.8 today  # Cachexia poor appetite- weight loss recommend Marinol.   # Start Rituxan next week; follow up with me in approximately 5 weeks CBC CMP and magnesium/Rituxan  infusion.

## 2015-07-27 ENCOUNTER — Telehealth: Payer: Self-pay | Admitting: *Deleted

## 2015-07-27 MED ORDER — MEGESTROL ACETATE 40 MG/ML PO SUSP: 400.0000 mg | Freq: Every day | ORAL | Status: DC

## 2015-07-27 NOTE — Telephone Encounter (Signed)
Per Dr Rogue Bussing , change Marinol to Megace 400 mg daily. Escribed and pharmacy notified

## 2015-07-27 NOTE — Telephone Encounter (Signed)
This was for an outpatient palliative care consult.  MD declined signing this order at this time. Md waiting on patient's decision on tx plan.

## 2015-07-27 NOTE — Telephone Encounter (Signed)
Looking for order for palliative care consult, please fax to office

## 2015-07-27 NOTE — Telephone Encounter (Signed)
Plan does not cover this medication.

## 2015-07-30 ENCOUNTER — Encounter: Payer: Self-pay | Admitting: *Deleted

## 2015-07-31 ENCOUNTER — Inpatient Hospital Stay: Payer: Medicare Other

## 2015-07-31 ENCOUNTER — Telehealth: Payer: Self-pay | Admitting: *Deleted

## 2015-07-31 NOTE — Telephone Encounter (Signed)
PA for Megace has been approved.

## 2015-08-04 ENCOUNTER — Telehealth: Payer: Self-pay | Admitting: *Deleted

## 2015-08-04 ENCOUNTER — Inpatient Hospital Stay: Payer: Medicare Other | Attending: Internal Medicine

## 2015-08-04 VITALS — BP 126/72 | HR 69 | Temp 96.9°F | Resp 18

## 2015-08-04 DIAGNOSIS — R64 Cachexia: Secondary | ICD-10-CM | POA: Insufficient documentation

## 2015-08-04 DIAGNOSIS — Z79899 Other long term (current) drug therapy: Secondary | ICD-10-CM | POA: Insufficient documentation

## 2015-08-04 DIAGNOSIS — F419 Anxiety disorder, unspecified: Secondary | ICD-10-CM | POA: Insufficient documentation

## 2015-08-04 DIAGNOSIS — R05 Cough: Secondary | ICD-10-CM | POA: Insufficient documentation

## 2015-08-04 DIAGNOSIS — C8213 Follicular lymphoma grade II, intra-abdominal lymph nodes: Secondary | ICD-10-CM | POA: Insufficient documentation

## 2015-08-04 DIAGNOSIS — Z5112 Encounter for antineoplastic immunotherapy: Secondary | ICD-10-CM | POA: Diagnosis not present

## 2015-08-04 DIAGNOSIS — R011 Cardiac murmur, unspecified: Secondary | ICD-10-CM | POA: Insufficient documentation

## 2015-08-04 DIAGNOSIS — Z87891 Personal history of nicotine dependence: Secondary | ICD-10-CM | POA: Diagnosis not present

## 2015-08-04 DIAGNOSIS — R630 Anorexia: Secondary | ICD-10-CM | POA: Insufficient documentation

## 2015-08-04 DIAGNOSIS — E7800 Pure hypercholesterolemia, unspecified: Secondary | ICD-10-CM | POA: Diagnosis not present

## 2015-08-04 DIAGNOSIS — R634 Abnormal weight loss: Secondary | ICD-10-CM | POA: Insufficient documentation

## 2015-08-04 DIAGNOSIS — I951 Orthostatic hypotension: Secondary | ICD-10-CM | POA: Diagnosis not present

## 2015-08-04 DIAGNOSIS — Z801 Family history of malignant neoplasm of trachea, bronchus and lung: Secondary | ICD-10-CM | POA: Insufficient documentation

## 2015-08-04 DIAGNOSIS — J90 Pleural effusion, not elsewhere classified: Secondary | ICD-10-CM | POA: Insufficient documentation

## 2015-08-04 DIAGNOSIS — D709 Neutropenia, unspecified: Secondary | ICD-10-CM | POA: Insufficient documentation

## 2015-08-04 DIAGNOSIS — Z8052 Family history of malignant neoplasm of bladder: Secondary | ICD-10-CM | POA: Diagnosis not present

## 2015-08-04 DIAGNOSIS — I10 Essential (primary) hypertension: Secondary | ICD-10-CM | POA: Diagnosis not present

## 2015-08-04 DIAGNOSIS — Z800 Family history of malignant neoplasm of digestive organs: Secondary | ICD-10-CM | POA: Diagnosis not present

## 2015-08-04 DIAGNOSIS — R079 Chest pain, unspecified: Secondary | ICD-10-CM | POA: Diagnosis not present

## 2015-08-04 DIAGNOSIS — E119 Type 2 diabetes mellitus without complications: Secondary | ICD-10-CM | POA: Insufficient documentation

## 2015-08-04 DIAGNOSIS — F329 Major depressive disorder, single episode, unspecified: Secondary | ICD-10-CM | POA: Diagnosis not present

## 2015-08-04 MED ORDER — SODIUM CHLORIDE 0.9 % IV SOLN
Freq: Once | INTRAVENOUS | Status: AC
  Administered 2015-08-04: 09:00:00 via INTRAVENOUS
  Filled 2015-08-04: qty 1000

## 2015-08-04 MED ORDER — DIPHENHYDRAMINE HCL 25 MG PO CAPS
25.0000 mg | ORAL_CAPSULE | Freq: Once | ORAL | Status: AC
  Administered 2015-08-04: 25 mg via ORAL
  Filled 2015-08-04: qty 1

## 2015-08-04 MED ORDER — SODIUM CHLORIDE 0.9 % IV SOLN: 375.0000 mg/m2 | Freq: Once | INTRAVENOUS | Status: DC

## 2015-08-04 MED ORDER — ACETAMINOPHEN 325 MG PO TABS
650.0000 mg | ORAL_TABLET | Freq: Once | ORAL | Status: AC
  Administered 2015-08-04: 650 mg via ORAL
  Filled 2015-08-04: qty 2

## 2015-08-04 MED ORDER — HEPARIN SOD (PORK) LOCK FLUSH 100 UNIT/ML IV SOLN
500.0000 [IU] | Freq: Once | INTRAVENOUS | Status: AC | PRN
  Administered 2015-08-04: 500 [IU]
  Filled 2015-08-04: qty 5

## 2015-08-04 MED ORDER — SODIUM CHLORIDE 0.9 % IV SOLN
375.0000 mg/m2 | Freq: Once | INTRAVENOUS | Status: AC
  Administered 2015-08-04: 600 mg via INTRAVENOUS
  Filled 2015-08-04: qty 60

## 2015-08-04 MED ORDER — SODIUM CHLORIDE 0.9% FLUSH
10.0000 mL | INTRAVENOUS | Status: DC | PRN
  Administered 2015-08-04: 10 mL
  Filled 2015-08-04: qty 10

## 2015-08-04 NOTE — Telephone Encounter (Signed)
[facility] received hospice referral forms to be completed. Called Hospice and spoke to [employer]. She states after not hearing back from the office they contact patient's cancer doctor. The family has decided not to proceed with referral. Should they change their mind the cancer doctor will be contacted to completed referral. Nothing further needed.

## 2015-08-04 NOTE — Telephone Encounter (Signed)
Noted-jh 

## 2015-08-13 ENCOUNTER — Ambulatory Visit
Admission: RE | Admit: 2015-08-13 | Discharge: 2015-08-13 | Disposition: A | Discharge status: Home / Self Care | Payer: Medicare Other | Source: Ambulatory Visit | Attending: Family Medicine | Admitting: Family Medicine

## 2015-08-13 ENCOUNTER — Ambulatory Visit (INDEPENDENT_AMBULATORY_CARE_PROVIDER_SITE_OTHER): Payer: Medicare Other | Admitting: Family Medicine

## 2015-08-13 ENCOUNTER — Encounter: Payer: Self-pay | Admitting: Family Medicine

## 2015-08-13 VITALS — BP 135/71 | HR 90 | Temp 98.3°F | Resp 16 | Ht 63.5 in | Wt 113.6 lb

## 2015-08-13 DIAGNOSIS — I6523 Occlusion and stenosis of bilateral carotid arteries: Secondary | ICD-10-CM | POA: Insufficient documentation

## 2015-08-13 DIAGNOSIS — J90 Pleural effusion, not elsewhere classified: Secondary | ICD-10-CM | POA: Diagnosis not present

## 2015-08-13 DIAGNOSIS — J948 Other specified pleural conditions: Secondary | ICD-10-CM | POA: Diagnosis not present

## 2015-08-13 DIAGNOSIS — R05 Cough: Secondary | ICD-10-CM | POA: Diagnosis not present

## 2015-08-13 DIAGNOSIS — J189 Pneumonia, unspecified organism: Secondary | ICD-10-CM

## 2015-08-13 DIAGNOSIS — C8213 Follicular lymphoma grade II, intra-abdominal lymph nodes: Secondary | ICD-10-CM | POA: Diagnosis not present

## 2015-08-13 DIAGNOSIS — I700 Atherosclerosis of aorta: Secondary | ICD-10-CM | POA: Insufficient documentation

## 2015-08-13 DIAGNOSIS — J9811 Atelectasis: Secondary | ICD-10-CM | POA: Insufficient documentation

## 2015-08-13 MED ORDER — HYDROCOD POLST-CPM POLST ER 10-8 MG/5ML PO SUER: 1.2500 mL | Freq: Two times a day (BID) | ORAL | Status: DC

## 2015-08-13 NOTE — Progress Notes (Signed)
Name: RHINA KRAMME   MRN: [id number]    DOB: Jul 21, 1919   Date:08/13/2015       Progress Note  Subjective  Chief Complaint  Chief Complaint  Patient presents with  . Hospitalization Follow-up    f/u of pneumonia.    HPI  Here for f/u of bilateral pneumonia 3 weeks ago.  Treated and released./  She has finished antibiotics.  She has lymphoma and is treated by Oncology (Dr. Tish Men).    She still has cough and clear sputum production.  SOB is  Better.  OBTW- she has a "spot " on her back from a possible bite that she wants checked. No problem-specific assessment & plan notes found for this encounter.   Past Medical History  Diagnosis Date  . HTN (hypertension)   . DM2 (diabetes mellitus, type 2) (HCC)     insulin requiring  . Hypercholesterolemia   . Murmur     hx  . Tinnitus     chronic  . Macular degeneration   . Chronic chest pain     a. nonobs cath 2002.  Marland Kitchen Palpitations     a. 01/2011 Holter monitor showed frequent PACs, sinus bradycardia  . Anxiety   . Depression   . Irregular cardiac rhythm   . Heart murmur     a. 09/2014 Echo: EF 55-60%, nl wm, mod MS, mild MR, mod dil LA, mild-mod TR.  . Cataract   . Non Hodgkin's lymphoma ([place])   . Hypomagnesemia 06/13/2014  . Non-obstructive CAD   . Orthostatic hypotension   . Decreased appetite     Past Surgical History  Procedure Laterality Date  . Cardiac catheterization  2002  . Tumor removal  2012    benign neck tumor removed  . Tooth extraction    . Knee surgery Left   . Ankle surgery Left   . Partial hysterectomy    . Insertion / placement pleural catheter      Family History  Problem Relation Age of Onset  . Heart disease Mother   . Lung cancer Brother     smoked  . Bladder Cancer Brother   . Stomach cancer Brother     Social History   Social History  . Marital Status: Widowed    Spouse Name: N/A  . Number of Children: N/A  . Years of Education: N/A   Occupational History  . Retired     Social History Main Topics  . Smoking status: Former Smoker -- 0.25 packs/day for 7 years    Types: Cigarettes    Quit date: 01/25/1988  . Smokeless tobacco: Never Used  . Alcohol Use: No  . Drug Use: No  . Sexual Activity: Not on file   Other Topics Concern  . Not on file   Social History Narrative   Retired. Widowed. 3 children, 7 grandchildren, 2 great- grandchildren.      Current outpatient prescriptions:  .  ALPRAZolam (XANAX) 0.25 MG tablet, Take 1 tablet (0.25 mg total) by mouth every 6 (six) hours as needed., Disp: 30 tablet, Rfl: 1 .  benzonatate (TESSALON) 100 MG capsule, Take 1 capsule (100 mg total) by mouth 3 (three) times daily as needed for cough., Disp: 30 capsule, Rfl: 0 .  cyclobenzaprine (FLEXERIL) 10 MG tablet, Take 10 mg by mouth 2 (two) times daily as needed for muscle spasms. Reported on 07/15/2015, Disp: , Rfl:  .  DOK 100 MG capsule, TAKE 1 CAPSULE(100 MG) BY MOUTH TWICE DAILY, Disp: 60  capsule, Rfl: 0 .  feeding supplement, ENSURE ENLIVE, (ENSURE ENLIVE) LIQD, Take 237 mLs by mouth 3 (three) times daily between meals., Disp: 237 mL, Rfl: 12 .  fluticasone (FLONASE) 50 MCG/ACT nasal spray, Place 1 spray into both nostrils daily. (Patient taking differently: Place 2 sprays into both nostrils daily. ), Disp: 16 g, Rfl: 6 .  lidocaine-prilocaine (EMLA) cream, Apply 1 application topically as needed., Disp: 30 g, Rfl: 3 .  meclizine (ANTIVERT) 12.5 MG tablet, Take 12.5 mg by mouth 3 (three) times daily as needed., Disp: , Rfl:  .  megestrol (MEGACE) 40 MG/ML suspension, Take 10 mLs (400 mg total) by mouth daily., Disp: 240 mL, Rfl: 3 .  mirtazapine (REMERON) 15 MG tablet, TAKE 1 TABLET(15 MG) BY MOUTH AT BEDTIME (Patient taking differently: TAKE 1 TABLET(15 MG) BY MOUTH AT BEDTIME AS NEEDED FOR SLEEP.), Disp: 90 tablet, Rfl: 1 .  Multiple Vitamin (MULTIVITAMIN WITH MINERALS) TABS tablet, Take 1 tablet by mouth daily., Disp: , Rfl:  .  nitroGLYCERIN (NITROSTAT) 0.4  MG SL tablet, Place 0.4 mg under the tongue every 5 (five) minutes x 3 doses as needed for chest pain. Reported on 07/15/2015, Disp: , Rfl:  .  pantoprazole (PROTONIX) 40 MG tablet, Take 1 tablet (40 mg total) by mouth daily. (Patient taking differently: Take 40 mg by mouth daily as needed. FOR HEARTBURN/INDIGESTION.), Disp: 30 tablet, Rfl: 6 .  polyethylene glycol powder (GLYCOLAX/MIRALAX) powder, Take 17 g by mouth daily as needed., Disp: 3350 g, Rfl: 0 .  SLOW-MAG 71.5-119 MG TBEC SR tablet, TAKE 1 TABLET (64 MG TOTAL) BY MOUTH 2 (TWO) TIMES DAILY., Disp: 60 tablet, Rfl: 3 .  traMADol (ULTRAM) 50 MG tablet, Take 1 tablet by mouth every 4-6 hours as needed for pain, Disp: 90 tablet, Rfl: 0 .  chlorpheniramine-HYDROcodone (TUSSIONEX PENNKINETIC ER) 10-8 MG/5ML SUER, Take 1.25 mLs by mouth 2 (two) times daily. Use only as needed for cough, Disp: 60 mL, Rfl: 0 No current facility-administered medications for this visit.  Facility-Administered Medications Ordered in Other Visits:  .  heparin lock flush 100 unit/mL, 500 Units, Intravenous, Once, Forest Gleason, MD, 500 Units at 07/24/14 1630 .  sodium chloride 0.9 % injection 10 mL, 10 mL, Intravenous, PRN, Forest Gleason, MD, 10 mL at 07/24/14 1020 .  sodium chloride 0.9 % injection 10 mL, 10 mL, Intravenous, PRN, Forest Gleason, MD, 10 mL at 08/12/14 0834  Allergies  Allergen Reactions  . Moxifloxacin Shortness Of Breath    hallucinations  . Codeine Other (See Comments)    unknown  . Prednisone     Hallucinations  . Unasyn [Ampicillin-Sulbactam Sodium] Other (See Comments)    unknown  . Penicillins Itching and Rash    Has patient had a PCN reaction causing immediate rash, facial/tongue/throat swelling, SOB or lightheadedness with hypotension: Yes Has patient had a PCN reaction causing severe rash involving mucus membranes or skin necrosis: No Has patient had a PCN reaction that required hospitalization No Has patient had a PCN reaction occurring  within the last 10 years: No If all of the above answers are "NO", then may proceed with Cephalosporin use.       Review of Systems  Constitutional: Positive for weight loss and malaise/fatigue. Negative for fever and chills.  HENT: Negative for hearing loss.   Eyes: Negative for blurred vision and double vision.  Respiratory: Positive for cough, sputum production and shortness of breath. Negative for wheezing.   Cardiovascular: Negative for chest pain, palpitations and  leg swelling.  Gastrointestinal: Positive for heartburn. Negative for abdominal pain and blood in stool.  Genitourinary: Negative for dysuria, urgency and frequency.  Musculoskeletal: Positive for myalgias. Negative for back pain and joint pain.  Skin: Negative for rash.       Spot on back  Neurological: Positive for weakness. Negative for dizziness, tremors and headaches.  Psychiatric/Behavioral: Negative for depression. The patient is not nervous/anxious and does not have insomnia.       Objective  Filed Vitals:   08/13/15 1446  BP: 135/71  Pulse: 90  Temp: 98.3 F (36.8 C)  TempSrc: Oral  Resp: 16  Height: 5' 3.5" (1.613 m)  Weight: 113 lb 9.6 oz (51.529 kg)    Physical Exam  Constitutional: She is oriented to person, place, and time and well-developed, well-nourished, and in no distress. No distress.  HENT:  Head: Normocephalic and atraumatic.  Neck: Normal range of motion. Neck supple. Carotid bruit is not present. No thyromegaly present.  Cardiovascular: Normal rate.  An irregularly irregular rhythm present. Exam reveals no gallop and no friction rub.   Murmur heard.  Systolic murmur is present with a grade of 1/6  throughout  Pulmonary/Chest: No respiratory distress. She has decreased breath sounds in the right lower field. She has no wheezes. She has no rales.  Abdominal: Soft. Bowel sounds are normal. There is no tenderness.  Musculoskeletal: She exhibits no edema.  Lymphadenopathy:    She  has no cervical adenopathy.  Neurological: She is alert and oriented to person, place, and time.  Skin:  6 mm red irritated circular lesion on L back.  Not red.  Not raised.  Vitals reviewed.      Recent Results (from the past 2160 hour(s))  CBC with Differential/Platelet     Status: Abnormal   Collection Time: 05/19/15  1:37 PM  Result Value Ref Range   WBC 1.1 (LL) 3.6 - 11.0 K/uL    Comment: [facility] CRITICAL VALUE PROTOCOL RESULT REPEATED AND VERIFIED    RBC 4.69 3.80 - 5.20 MIL/uL   Hemoglobin 13.4 12.0 - 16.0 g/dL   HCT 39.4 35.0 - 47.0 %   MCV 84.1 80.0 - 100.0 fL   MCH 28.5 26.0 - 34.0 pg   MCHC 33.9 32.0 - 36.0 g/dL   RDW 15.0 (H) 11.5 - 14.5 %   Platelets 117 (L) 150 - 440 K/uL   Neutrophils Relative % 39% %   Neutro Abs 0.4 (L) 1.4 - 6.5 K/uL    Comment: RESULT REPEATED AND VERIFIED CRITICAL RESULT CALLED TO, READ BACK BY AND VERIFIED WITH: HAYLEY RHODE AT 1351 05/19/2015 KMR    Lymphocytes Relative 43% %   Lymphs Abs 0.5 (L) 1.0 - 3.6 K/uL   Monocytes Relative 13% %   Monocytes Absolute 0.1 (L) 0.2 - 0.9 K/uL   Eosinophils Relative 2% %   Eosinophils Absolute 0.0 0 - 0.7 K/uL   Basophils Relative 3% %   Basophils Absolute 0.0 0 - 0.1 K/uL   Smear Review SMEAR SCANNED   Comprehensive metabolic panel     Status: Abnormal   Collection Time: 05/19/15  1:37 PM  Result Value Ref Range   Sodium 134 (L) 135 - 145 mmol/L   Potassium 4.2 3.5 - 5.1 mmol/L   Chloride 100 (L) 101 - 111 mmol/L   CO2 21 (L) 22 - 32 mmol/L   Glucose, Bld 162 (H) 65 - 99 mg/dL   BUN 15 6 - 20 mg/dL   Creatinine,  Ser 0.89 0.44 - 1.00 mg/dL   Calcium 9.0 8.9 - 10.3 mg/dL   Total Protein 6.3 (L) 6.5 - 8.1 g/dL   Albumin 3.5 3.5 - 5.0 g/dL   AST 28 15 - 41 U/L   ALT 12 (L) 14 - 54 U/L   Alkaline Phosphatase 93 38 - 126 U/L   Total Bilirubin 0.4 0.3 - 1.2 mg/dL   GFR calc non Af Amer 54 (L) >60 mL/min   GFR calc Af Amer >60 >60 mL/min    Comment: (NOTE) The eGFR has been  calculated using the CKD EPI equation. This calculation has not been validated in all clinical situations. eGFR's persistently <60 mL/min signify possible Chronic Kidney Disease.    Anion gap 13 5 - 15  Magnesium     Status: Abnormal   Collection Time: 05/19/15  1:37 PM  Result Value Ref Range   Magnesium 1.4 (L) 1.7 - 2.4 mg/dL  Urinalysis complete, with microscopic [facility])     Status: Abnormal   Collection Time: 05/20/15  2:52 PM  Result Value Ref Range   Color, Urine YELLOW (A) YELLOW   APPearance CLEAR (A) CLEAR   Glucose, UA NEGATIVE NEGATIVE mg/dL   Bilirubin Urine NEGATIVE NEGATIVE   Ketones, ur NEGATIVE NEGATIVE mg/dL   Specific Gravity, Urine 1.014 1.005 - 1.030   Hgb urine dipstick NEGATIVE NEGATIVE   pH 6.0 5.0 - 8.0   Protein, ur NEGATIVE NEGATIVE mg/dL   Nitrite NEGATIVE NEGATIVE   Leukocytes, UA NEGATIVE NEGATIVE   RBC / HPF 0-5 0 - 5 RBC/hpf   WBC, UA 0-5 0 - 5 WBC/hpf   Bacteria, UA NONE SEEN NONE SEEN   Squamous Epithelial / LPF 0-5 (A) NONE SEEN   Mucous PRESENT   Urine culture     Status: Abnormal   Collection Time: 05/20/15  2:52 PM  Result Value Ref Range   Specimen Description URINE, CLEAN CATCH    Special Requests CLEAN CATCH URINE    Culture MULTIPLE SPECIES PRESENT, SUGGEST RECOLLECTION (A)    Report Status 05/22/2015 FINAL   Glucose, capillary     Status: Abnormal   Collection Time: 05/26/15  8:19 AM  Result Value Ref Range   Glucose-Capillary 108 (H) 65 - 99 mg/dL  CBC with Differential     Status: Abnormal   Collection Time: 05/27/15  1:58 PM  Result Value Ref Range   WBC 4.0 3.6 - 11.0 K/uL   RBC 4.18 3.80 - 5.20 MIL/uL   Hemoglobin 11.9 (L) 12.0 - 16.0 g/dL   HCT 35.0 35.0 - 47.0 %   MCV 83.7 80.0 - 100.0 fL   MCH 28.4 26.0 - 34.0 pg   MCHC 33.9 32.0 - 36.0 g/dL   RDW 14.8 (H) 11.5 - 14.5 %   Platelets 202 150 - 440 K/uL   Neutrophils Relative % 21% %   Neutro Abs 0.9 (L) 1.4 - 6.5 K/uL   Lymphocytes Relative 56% %   Lymphs Abs 2.3  1.0 - 3.6 K/uL   Monocytes Relative 18% %   Monocytes Absolute 0.7 0.2 - 0.9 K/uL   Eosinophils Relative 4% %   Eosinophils Absolute 0.2 0 - 0.7 K/uL   Basophils Relative 1% %   Basophils Absolute 0.0 0 - 0.1 K/uL   Smear Review SMEAR SCANNED   Comprehensive metabolic panel     Status: Abnormal   Collection Time: 05/27/15  1:58 PM  Result Value Ref Range   Sodium 137 135 - 145  mmol/L   Potassium 3.8 3.5 - 5.1 mmol/L   Chloride 104 101 - 111 mmol/L   CO2 24 22 - 32 mmol/L   Glucose, Bld 134 (H) 65 - 99 mg/dL   BUN 14 6 - 20 mg/dL   Creatinine, Ser 0.79 0.44 - 1.00 mg/dL   Calcium 8.7 (L) 8.9 - 10.3 mg/dL   Total Protein 6.2 (L) 6.5 - 8.1 g/dL   Albumin 3.5 3.5 - 5.0 g/dL   AST 22 15 - 41 U/L   ALT 12 (L) 14 - 54 U/L   Alkaline Phosphatase 87 38 - 126 U/L   Total Bilirubin 0.5 0.3 - 1.2 mg/dL   GFR calc non Af Amer >60 >60 mL/min   GFR calc Af Amer >60 >60 mL/min    Comment: (NOTE) The eGFR has been calculated using the CKD EPI equation. This calculation has not been validated in all clinical situations. eGFR's persistently <60 mL/min signify possible Chronic Kidney Disease.    Anion gap 9 5 - 15  Magnesium     Status: Abnormal   Collection Time: 05/27/15  1:58 PM  Result Value Ref Range   Magnesium 1.6 (L) 1.7 - 2.4 mg/dL  Comprehensive metabolic panel     Status: Abnormal   Collection Time: 07/07/15 11:00 AM  Result Value Ref Range   Sodium 131 (L) 135 - 145 mmol/L   Potassium 3.5 3.5 - 5.1 mmol/L   Chloride 97 (L) 101 - 111 mmol/L   CO2 26 22 - 32 mmol/L   Glucose, Bld 219 (H) 65 - 99 mg/dL   BUN 10 6 - 20 mg/dL   Creatinine, Ser 0.88 0.44 - 1.00 mg/dL   Calcium 8.3 (L) 8.9 - 10.3 mg/dL   Total Protein 6.6 6.5 - 8.1 g/dL   Albumin 3.3 (L) 3.5 - 5.0 g/dL   AST 23 15 - 41 U/L   ALT 10 (L) 14 - 54 U/L   Alkaline Phosphatase 93 38 - 126 U/L   Total Bilirubin 0.7 0.3 - 1.2 mg/dL   GFR calc non Af Amer 55 (L) >60 mL/min   GFR calc Af Amer >60 >60 mL/min    Comment:  (NOTE) The eGFR has been calculated using the CKD EPI equation. This calculation has not been validated in all clinical situations. eGFR's persistently <60 mL/min signify possible Chronic Kidney Disease.    Anion gap 8 5 - 15  Magnesium     Status: Abnormal   Collection Time: 07/07/15 11:00 AM  Result Value Ref Range   Magnesium 1.3 (L) 1.7 - 2.4 mg/dL  CBC with Differential/Platelet     Status: Abnormal   Collection Time: 07/07/15 11:00 AM  Result Value Ref Range   WBC 1.9 (L) 3.6 - 11.0 K/uL   RBC 4.32 3.80 - 5.20 MIL/uL   Hemoglobin 12.3 12.0 - 16.0 g/dL   HCT 36.7 35.0 - 47.0 %   MCV 85.0 80.0 - 100.0 fL   MCH 28.4 26.0 - 34.0 pg   MCHC 33.5 32.0 - 36.0 g/dL   RDW 15.8 (H) 11.5 - 14.5 %   Platelets 164 150 - 440 K/uL   Neutrophils Relative % 36 %   Neutro Abs 0.7 (L) 1.4 - 6.5 K/uL   Lymphocytes Relative 34 %   Lymphs Abs 0.7 (L) 1.0 - 3.6 K/uL   Monocytes Relative 28 %   Monocytes Absolute 0.5 0.2 - 0.9 K/uL   Eosinophils Relative 1 %   Eosinophils Absolute 0.0 0 -  0.7 K/uL   Basophils Relative 1 %   Basophils Absolute 0.0 0 - 0.1 K/uL   Smear Review SMEAR SCANNED   CBC with Differential     Status: Abnormal   Collection Time: 07/15/15  1:00 PM  Result Value Ref Range   WBC 3.0 (L) 3.6 - 11.0 K/uL   RBC 4.16 3.80 - 5.20 MIL/uL   Hemoglobin 11.6 (L) 12.0 - 16.0 g/dL   HCT 35.1 35.0 - 47.0 %   MCV 84.3 80.0 - 100.0 fL   MCH 28.0 26.0 - 34.0 pg   MCHC 33.2 32.0 - 36.0 g/dL   RDW 15.3 (H) 11.5 - 14.5 %   Platelets 258 150 - 440 K/uL   Neutrophils Relative % 64 %   Neutro Abs 1.9 1.4 - 6.5 K/uL   Lymphocytes Relative 22 %   Lymphs Abs 0.7 (L) 1.0 - 3.6 K/uL   Monocytes Relative 12 %   Monocytes Absolute 0.4 0.2 - 0.9 K/uL   Eosinophils Relative 1 %   Eosinophils Absolute 0.0 0 - 0.7 K/uL   Basophils Relative 1 %   Basophils Absolute 0.0 0 - 0.1 K/uL  Comprehensive metabolic panel     Status: Abnormal   Collection Time: 07/15/15  1:00 PM  Result Value Ref  Range   Sodium 130 (L) 135 - 145 mmol/L   Potassium 3.9 3.5 - 5.1 mmol/L   Chloride 95 (L) 101 - 111 mmol/L   CO2 26 22 - 32 mmol/L   Glucose, Bld 188 (H) 65 - 99 mg/dL   BUN 16 6 - 20 mg/dL   Creatinine, Ser 1.00 0.44 - 1.00 mg/dL   Calcium 8.4 (L) 8.9 - 10.3 mg/dL   Total Protein 6.4 (L) 6.5 - 8.1 g/dL   Albumin 2.9 (L) 3.5 - 5.0 g/dL   AST 79 (H) 15 - 41 U/L   ALT 61 (H) 14 - 54 U/L   Alkaline Phosphatase 129 (H) 38 - 126 U/L   Total Bilirubin 0.7 0.3 - 1.2 mg/dL   GFR calc non Af Amer 47 (L) >60 mL/min   GFR calc Af Amer 54 (L) >60 mL/min    Comment: (NOTE) The eGFR has been calculated using the CKD EPI equation. This calculation has not been validated in all clinical situations. eGFR's persistently <60 mL/min signify possible Chronic Kidney Disease.    Anion gap 9 5 - 15  Magnesium     Status: Abnormal   Collection Time: 07/15/15  1:00 PM  Result Value Ref Range   Magnesium 1.6 (L) 1.7 - 2.4 mg/dL  Lactic acid, plasma     Status: None   Collection Time: 07/19/15  2:00 PM  Result Value Ref Range   Lactic Acid, Venous 1.3 0.5 - 2.0 mmol/L  Comprehensive metabolic panel     Status: Abnormal   Collection Time: 07/19/15  2:00 PM  Result Value Ref Range   Sodium 130 (L) 135 - 145 mmol/L   Potassium 3.9 3.5 - 5.1 mmol/L   Chloride 94 (L) 101 - 111 mmol/L   CO2 27 22 - 32 mmol/L   Glucose, Bld 161 (H) 65 - 99 mg/dL   BUN 12 6 - 20 mg/dL   Creatinine, Ser 0.83 0.44 - 1.00 mg/dL   Calcium 8.2 (L) 8.9 - 10.3 mg/dL   Total Protein 5.9 (L) 6.5 - 8.1 g/dL   Albumin 2.5 (L) 3.5 - 5.0 g/dL   AST 31 15 - 41 U/L   ALT 28  14 - 54 U/L   Alkaline Phosphatase 100 38 - 126 U/L   Total Bilirubin 0.7 0.3 - 1.2 mg/dL   GFR calc non Af Amer 59 (L) >60 mL/min   GFR calc Af Amer >60 >60 mL/min    Comment: (NOTE) The eGFR has been calculated using the CKD EPI equation. This calculation has not been validated in all clinical situations. eGFR's persistently <60 mL/min signify possible  Chronic Kidney Disease.    Anion gap 9 5 - 15  Troponin I     Status: None   Collection Time: 07/19/15  2:00 PM  Result Value Ref Range   Troponin I <0.03 <0.031 ng/mL    Comment:        NO INDICATION OF MYOCARDIAL INJURY.   CBC WITH DIFFERENTIAL     Status: Abnormal   Collection Time: 07/19/15  2:00 PM  Result Value Ref Range   WBC 2.2 (L) 3.6 - 11.0 K/uL   RBC 3.98 3.80 - 5.20 MIL/uL   Hemoglobin 11.6 (L) 12.0 - 16.0 g/dL   HCT 33.2 (L) 35.0 - 47.0 %   MCV 83.5 80.0 - 100.0 fL   MCH 29.3 26.0 - 34.0 pg   MCHC 35.0 32.0 - 36.0 g/dL   RDW 15.3 (H) 11.5 - 14.5 %   Platelets 255 150 - 440 K/uL   Neutrophils Relative % 35 %   Lymphocytes Relative 45 %   Monocytes Relative 15 %   Eosinophils Relative 2 %   Basophils Relative 0 %   Band Neutrophils 0 %   Metamyelocytes Relative 2 %   Myelocytes 1 %   Promyelocytes Absolute 0 %   Blasts 0 %   nRBC 0 0 /100 WBC   Other 0 %   Neutro Abs 0.8 (L) 1.4 - 6.5 K/uL   Lymphs Abs 1.1 1.0 - 3.6 K/uL   Monocytes Absolute 0.3 0.2 - 0.9 K/uL   Eosinophils Absolute 0.0 0 - 0.7 K/uL   Basophils Absolute 0.0 0 - 0.1 K/uL   RBC Morphology MIXED RBC POPULATION    Smear Review      PATH REVIEW HAS BEEN ORDERED ON THIS ACCESSION NUMBER  Blood Culture (routine x 2)     Status: None   Collection Time: 07/19/15  2:00 PM  Result Value Ref Range   Specimen Description BLOOD PORT    Special Requests BOTTLES DRAWN AEROBIC AND ANAEROBIC  2CC    Culture NO GROWTH 5 DAYS    Report Status 07/24/2015 FINAL   Blood Culture (routine x 2)     Status: None   Collection Time: 07/19/15  2:00 PM  Result Value Ref Range   Specimen Description BLOOD LEFT ANTECUBITAL    Special Requests BOTTLES DRAWN AEROBIC AND ANAEROBIC  1CC    Culture NO GROWTH 5 DAYS    Report Status 07/24/2015 FINAL   Urinalysis complete, with microscopic ([facility] only)     Status: Abnormal   Collection Time: 07/19/15  2:00 PM  Result Value Ref Range   Color, Urine YELLOW (A) YELLOW    APPearance CLEAR (A) CLEAR   Glucose, UA NEGATIVE NEGATIVE mg/dL   Bilirubin Urine NEGATIVE NEGATIVE   Ketones, ur NEGATIVE NEGATIVE mg/dL   Specific Gravity, Urine 1.013 1.005 - 1.030   Hgb urine dipstick NEGATIVE NEGATIVE   pH 6.0 5.0 - 8.0   Protein, ur NEGATIVE NEGATIVE mg/dL   Nitrite NEGATIVE NEGATIVE   Leukocytes, UA NEGATIVE NEGATIVE   RBC / HPF 0-5  0 - 5 RBC/hpf   WBC, UA 0-5 0 - 5 WBC/hpf   Bacteria, UA NONE SEEN NONE SEEN   Squamous Epithelial / LPF 0-5 (A) NONE SEEN   Mucous PRESENT   Urine culture     Status: Abnormal   Collection Time: 07/19/15  2:00 PM  Result Value Ref Range   Specimen Description URINE, RANDOM    Special Requests NONE    Culture MULTIPLE SPECIES PRESENT, SUGGEST RECOLLECTION (A)    Report Status 07/20/2015 FINAL   Pathologist smear review     Status: None   Collection Time: 07/19/15  2:00 PM  Result Value Ref Range   Path Review      Peripheral smear review shows a leukopenia that is baseline per [facility]. Rare myeloid blasts are seen and normocytic anemia. Dr. Luana Shu.  Lactic acid, plasma     Status: None   Collection Time: 07/19/15  4:33 PM  Result Value Ref Range   Lactic Acid, Venous 1.3 0.5 - 2.0 mmol/L  TSH     Status: None   Collection Time: 07/19/15  4:33 PM  Result Value Ref Range   TSH 0.747 0.350 - 4.500 uIU/mL  CBC     Status: Abnormal   Collection Time: 07/20/15  4:18 AM  Result Value Ref Range   WBC 1.6 (L) 3.6 - 11.0 K/uL   RBC 3.48 (L) 3.80 - 5.20 MIL/uL   Hemoglobin 10.0 (L) 12.0 - 16.0 g/dL   HCT 29.1 (L) 35.0 - 47.0 %   MCV 83.6 80.0 - 100.0 fL   MCH 28.7 26.0 - 34.0 pg   MCHC 34.3 32.0 - 36.0 g/dL   RDW 15.2 (H) 11.5 - 14.5 %   Platelets 204 150 - 440 K/uL  Basic metabolic panel     Status: Abnormal   Collection Time: 07/20/15  4:18 AM  Result Value Ref Range   Sodium 134 (L) 135 - 145 mmol/L   Potassium 3.4 (L) 3.5 - 5.1 mmol/L   Chloride 103 101 - 111 mmol/L   CO2 25 22 - 32 mmol/L   Glucose, Bld 125 (H) 65 - 99  mg/dL   BUN 8 6 - 20 mg/dL   Creatinine, Ser 0.62 0.44 - 1.00 mg/dL   Calcium 7.5 (L) 8.9 - 10.3 mg/dL   GFR calc non Af Amer >60 >60 mL/min   GFR calc Af Amer >60 >60 mL/min    Comment: (NOTE) The eGFR has been calculated using the CKD EPI equation. This calculation has not been validated in all clinical situations. eGFR's persistently <60 mL/min signify possible Chronic Kidney Disease.    Anion gap 6 5 - 15  CBC     Status: Abnormal   Collection Time: 07/21/15  4:30 AM  Result Value Ref Range   WBC 1.6 (L) 3.6 - 11.0 K/uL   RBC 3.68 (L) 3.80 - 5.20 MIL/uL   Hemoglobin 10.4 (L) 12.0 - 16.0 g/dL   HCT 30.5 (L) 35.0 - 47.0 %   MCV 82.7 80.0 - 100.0 fL   MCH 28.2 26.0 - 34.0 pg   MCHC 34.1 32.0 - 36.0 g/dL   RDW 15.3 (H) 11.5 - 14.5 %   Platelets 219 150 - 440 K/uL  Basic metabolic panel     Status: Abnormal   Collection Time: 07/21/15  4:30 AM  Result Value Ref Range   Sodium 134 (L) 135 - 145 mmol/L   Potassium 3.7 3.5 - 5.1 mmol/L   Chloride 103 101 -  111 mmol/L   CO2 25 22 - 32 mmol/L   Glucose, Bld 111 (H) 65 - 99 mg/dL   BUN 7 6 - 20 mg/dL   Creatinine, Ser 0.65 0.44 - 1.00 mg/dL   Calcium 7.9 (L) 8.9 - 10.3 mg/dL   GFR calc non Af Amer >60 >60 mL/min   GFR calc Af Amer >60 >60 mL/min    Comment: (NOTE) The eGFR has been calculated using the CKD EPI equation. This calculation has not been validated in all clinical situations. eGFR's persistently <60 mL/min signify possible Chronic Kidney Disease.    Anion gap 6 5 - 15  CBC with Differential     Status: Abnormal   Collection Time: 07/24/15  3:00 PM  Result Value Ref Range   WBC 2.1 (L) 3.6 - 11.0 K/uL   RBC 4.09 3.80 - 5.20 MIL/uL   Hemoglobin 11.4 (L) 12.0 - 16.0 g/dL   HCT 34.0 (L) 35.0 - 47.0 %   MCV 83.2 80.0 - 100.0 fL   MCH 27.8 26.0 - 34.0 pg   MCHC 33.5 32.0 - 36.0 g/dL   RDW 15.5 (H) 11.5 - 14.5 %   Platelets 272 150 - 440 K/uL   Neutrophils Relative % 28% %   Neutro Abs 0.6 (L) 1.4 - 6.5 K/uL    Lymphocytes Relative 47% %   Lymphs Abs 1.0 1.0 - 3.6 K/uL   Monocytes Relative 14% %   Monocytes Absolute 0.3 0.2 - 0.9 K/uL   Eosinophils Relative 9% %   Eosinophils Absolute 0.2 0 - 0.7 K/uL   Basophils Relative 2% %   Basophils Absolute 0.0 0 - 0.1 K/uL   Smear Review SMEAR SCANNED   Comprehensive metabolic panel     Status: Abnormal   Collection Time: 07/24/15  3:00 PM  Result Value Ref Range   Sodium 136 135 - 145 mmol/L   Potassium 4.6 3.5 - 5.1 mmol/L   Chloride 100 (L) 101 - 111 mmol/L   CO2 31 22 - 32 mmol/L   Glucose, Bld 170 (H) 65 - 99 mg/dL   BUN 10 6 - 20 mg/dL   Creatinine, Ser 0.68 0.44 - 1.00 mg/dL   Calcium 8.6 (L) 8.9 - 10.3 mg/dL   Total Protein 6.0 (L) 6.5 - 8.1 g/dL   Albumin 2.8 (L) 3.5 - 5.0 g/dL   AST 28 15 - 41 U/L   ALT 21 14 - 54 U/L   Alkaline Phosphatase 108 38 - 126 U/L   Total Bilirubin 0.3 0.3 - 1.2 mg/dL   GFR calc non Af Amer >60 >60 mL/min   GFR calc Af Amer >60 >60 mL/min    Comment: (NOTE) The eGFR has been calculated using the CKD EPI equation. This calculation has not been validated in all clinical situations. eGFR's persistently <60 mL/min signify possible Chronic Kidney Disease.    Anion gap 5 5 - 15  Magnesium     Status: None   Collection Time: 07/24/15  3:00 PM  Result Value Ref Range   Magnesium 1.8 1.7 - 2.4 mg/dL     Assessment & Plan  Problem List Items Addressed This Visit      Respiratory   Pleural effusion on right - Primary     Immune and Lymphatic   Follicular lymphoma grade II of intra-abdominal lymph nodes (HCC)    Other Visit Diagnoses    Bilateral pneumonia        Relevant Medications    chlorpheniramine-HYDROcodone (TUSSIONEX PENNKINETIC  ER) 10-8 MG/5ML SUER    Other Relevant Orders    DG Chest 2 View       Meds ordered this encounter  Medications  . chlorpheniramine-HYDROcodone (TUSSIONEX PENNKINETIC ER) 10-8 MG/5ML SUER    Sig: Take 1.25 mLs by mouth 2 (two) times daily. Use only as needed  for cough    Dispense:  60 mL    Refill:  0   1. Pleural effusion on right   2. Follicular lymphoma grade II of intra-abdominal lymph nodes ([place])   3. Bilateral pneumonia  - DG Chest 2 View; Future - chlorpheniramine-HYDROcodone (TUSSIONEX PENNKINETIC ER) 10-8 MG/5ML SUER; Take 1.25 mLs by mouth 2 (two) times daily. Use only as needed for cough  Dispense: 60 mL; Refill: 0

## 2015-08-14 ENCOUNTER — Other Ambulatory Visit: Payer: Self-pay | Admitting: Family Medicine

## 2015-08-14 DIAGNOSIS — J189 Pneumonia, unspecified organism: Secondary | ICD-10-CM

## 2015-08-14 MED ORDER — CEFPODOXIME PROXETIL 200 MG PO TABS: 200.0000 mg | ORAL_TABLET | Freq: Two times a day (BID) | ORAL | Status: DC

## 2015-08-14 MED ORDER — AZITHROMYCIN 250 MG PO TABS: ORAL_TABLET | ORAL | Status: DC

## 2015-08-20 ENCOUNTER — Other Ambulatory Visit: Payer: Self-pay | Admitting: Internal Medicine

## 2015-08-20 DIAGNOSIS — C8590 Non-Hodgkin lymphoma, unspecified, unspecified site: Secondary | ICD-10-CM

## 2015-08-21 ENCOUNTER — Encounter (INDEPENDENT_AMBULATORY_CARE_PROVIDER_SITE_OTHER): Payer: Self-pay

## 2015-08-21 ENCOUNTER — Inpatient Hospital Stay: Payer: Medicare Other

## 2015-08-21 ENCOUNTER — Inpatient Hospital Stay (HOSPITAL_BASED_OUTPATIENT_CLINIC_OR_DEPARTMENT_OTHER): Payer: Medicare Other | Admitting: Internal Medicine

## 2015-08-21 ENCOUNTER — Other Ambulatory Visit: Payer: Self-pay | Admitting: Family Medicine

## 2015-08-21 VITALS — BP 169/81 | HR 80 | Temp 97.8°F | Resp 18 | Wt 114.3 lb

## 2015-08-21 DIAGNOSIS — C8599 Non-Hodgkin lymphoma, unspecified, extranodal and solid organ sites: Secondary | ICD-10-CM

## 2015-08-21 DIAGNOSIS — D709 Neutropenia, unspecified: Secondary | ICD-10-CM

## 2015-08-21 DIAGNOSIS — F419 Anxiety disorder, unspecified: Secondary | ICD-10-CM

## 2015-08-21 DIAGNOSIS — R634 Abnormal weight loss: Secondary | ICD-10-CM

## 2015-08-21 DIAGNOSIS — I10 Essential (primary) hypertension: Secondary | ICD-10-CM

## 2015-08-21 DIAGNOSIS — J90 Pleural effusion, not elsewhere classified: Secondary | ICD-10-CM | POA: Diagnosis not present

## 2015-08-21 DIAGNOSIS — Z801 Family history of malignant neoplasm of trachea, bronchus and lung: Secondary | ICD-10-CM

## 2015-08-21 DIAGNOSIS — C8590 Non-Hodgkin lymphoma, unspecified, unspecified site: Secondary | ICD-10-CM

## 2015-08-21 DIAGNOSIS — Z5112 Encounter for antineoplastic immunotherapy: Secondary | ICD-10-CM | POA: Diagnosis not present

## 2015-08-21 DIAGNOSIS — C8213 Follicular lymphoma grade II, intra-abdominal lymph nodes: Secondary | ICD-10-CM

## 2015-08-21 DIAGNOSIS — R05 Cough: Secondary | ICD-10-CM

## 2015-08-21 DIAGNOSIS — R079 Chest pain, unspecified: Secondary | ICD-10-CM

## 2015-08-21 DIAGNOSIS — R630 Anorexia: Secondary | ICD-10-CM

## 2015-08-21 DIAGNOSIS — E7800 Pure hypercholesterolemia, unspecified: Secondary | ICD-10-CM

## 2015-08-21 DIAGNOSIS — Z800 Family history of malignant neoplasm of digestive organs: Secondary | ICD-10-CM

## 2015-08-21 DIAGNOSIS — E119 Type 2 diabetes mellitus without complications: Secondary | ICD-10-CM

## 2015-08-21 DIAGNOSIS — Z79899 Other long term (current) drug therapy: Secondary | ICD-10-CM

## 2015-08-21 DIAGNOSIS — Z87891 Personal history of nicotine dependence: Secondary | ICD-10-CM

## 2015-08-21 DIAGNOSIS — Z8052 Family history of malignant neoplasm of bladder: Secondary | ICD-10-CM

## 2015-08-21 DIAGNOSIS — R64 Cachexia: Secondary | ICD-10-CM

## 2015-08-21 DIAGNOSIS — R011 Cardiac murmur, unspecified: Secondary | ICD-10-CM

## 2015-08-21 DIAGNOSIS — F329 Major depressive disorder, single episode, unspecified: Secondary | ICD-10-CM

## 2015-08-21 DIAGNOSIS — I951 Orthostatic hypotension: Secondary | ICD-10-CM

## 2015-08-21 LAB — COMPREHENSIVE METABOLIC PANEL
ALT: 10 U/L — ABNORMAL LOW (ref 14–54)
ANION GAP: 9 (ref 5–15)
AST: 19 U/L (ref 15–41)
Albumin: 3 g/dL — ABNORMAL LOW (ref 3.5–5.0)
Alkaline Phosphatase: 81 U/L (ref 38–126)
BUN: 15 mg/dL (ref 6–20)
CHLORIDE: 102 mmol/L (ref 101–111)
CO2: 22 mmol/L (ref 22–32)
Calcium: 8.3 mg/dL — ABNORMAL LOW (ref 8.9–10.3)
Creatinine, Ser: 0.87 mg/dL (ref 0.44–1.00)
GFR calc non Af Amer: 55 mL/min — ABNORMAL LOW (ref >60)
Glucose, Bld: 192 mg/dL — ABNORMAL HIGH (ref 65–99)
POTASSIUM: 3.9 mmol/L (ref 3.5–5.1)
SODIUM: 133 mmol/L — AB (ref 135–145)
Total Bilirubin: 0.7 mg/dL (ref 0.3–1.2)
Total Protein: 6.5 g/dL (ref 6.5–8.1)

## 2015-08-21 LAB — LACTATE DEHYDROGENASE: LDH: 295 U/L — AB (ref 98–192)

## 2015-08-21 LAB — CBC WITH DIFFERENTIAL/PLATELET
BASOS PCT: 3 %
Basophils Absolute: 0.1 10*3/uL (ref 0–0.1)
EOS ABS: 0.1 10*3/uL (ref 0–0.7)
EOS PCT: 6 %
HCT: 35.1 % (ref 35.0–47.0)
Hemoglobin: 11.9 g/dL — ABNORMAL LOW (ref 12.0–16.0)
Lymphocytes Relative: 45 %
Lymphs Abs: 0.9 10*3/uL — ABNORMAL LOW (ref 1.0–3.6)
MCH: 28.4 pg (ref 26.0–34.0)
MCHC: 33.8 g/dL (ref 32.0–36.0)
MCV: 83.9 fL (ref 80.0–100.0)
MONO ABS: 0.4 10*3/uL (ref 0.2–0.9)
MONOS PCT: 18 %
NEUTROS PCT: 29 %
Neutro Abs: 0.6 10*3/uL — ABNORMAL LOW (ref 1.4–6.5)
PLATELETS: 185 10*3/uL (ref 150–400)
RBC: 4.19 MIL/uL (ref 3.80–5.20)
RDW: 17.3 % — AB (ref 11.5–14.5)
WBC: 2.1 10*3/uL — ABNORMAL LOW (ref 3.6–11.0)

## 2015-08-21 LAB — MAGNESIUM: MAGNESIUM: 1.5 mg/dL — AB (ref 1.7–2.4)

## 2015-08-21 MED ORDER — SODIUM CHLORIDE 0.9 % IV SOLN
375.0000 mg/m2 | Freq: Once | INTRAVENOUS | Status: AC
  Administered 2015-08-21: 600 mg via INTRAVENOUS
  Filled 2015-08-21: qty 50
  Filled 2015-08-21: qty 60

## 2015-08-21 MED ORDER — HEPARIN SOD (PORK) LOCK FLUSH 100 UNIT/ML IV SOLN
500.0000 [IU] | Freq: Once | INTRAVENOUS | Status: AC
  Administered 2015-08-21: 500 [IU] via INTRAVENOUS
  Filled 2015-08-21: qty 5

## 2015-08-21 MED ORDER — SODIUM CHLORIDE 0.9 % IV SOLN
Freq: Once | INTRAVENOUS | Status: AC
  Administered 2015-08-21: 11:00:00 via INTRAVENOUS
  Filled 2015-08-21: qty 1000

## 2015-08-21 MED ORDER — SODIUM CHLORIDE 0.9% FLUSH
10.0000 mL | INTRAVENOUS | Status: DC | PRN
  Administered 2015-08-21: 10 mL via INTRAVENOUS
  Filled 2015-08-21: qty 10

## 2015-08-21 MED ORDER — ACETAMINOPHEN 325 MG PO TABS
650.0000 mg | ORAL_TABLET | Freq: Once | ORAL | Status: AC
  Administered 2015-08-21: 650 mg via ORAL
  Filled 2015-08-21: qty 2

## 2015-08-21 MED ORDER — DIPHENHYDRAMINE HCL 25 MG PO CAPS
25.0000 mg | ORAL_CAPSULE | Freq: Once | ORAL | Status: AC
  Administered 2015-08-21: 25 mg via ORAL
  Filled 2015-08-21: qty 1

## 2015-08-21 MED ORDER — MAGNESIUM SULFATE 2 GM/50ML IV SOLN
2.0000 g | Freq: Once | INTRAVENOUS | Status: AC
  Administered 2015-08-21: 2 g via INTRAVENOUS
  Filled 2015-08-21: qty 50

## 2015-08-21 NOTE — Assessment & Plan Note (Addendum)
#   Follicular lymphoma G-2  With recurrence- and right external iliac lymph node  [May 2017 on PET scan]; however recurrent pleural effusion-possibly related to lymphoma.  Currently on single agent rituximab a monthly basis.  # right pleural effusion- ? Related to lymphoma-  250cc in 10 days..   # Cough-cxr-recent- ? Pmeumoai/ s/p z-pak.  # Neutropenia- ANC 800 recently; [place] from today is pending. Question medication versus bone marrow infiltration. Monitor for now  # Hypomagnesemia- 1.5/  Recommend  Medication IV  # Cachexia poor appetite- weight loss-  On Marinol improving   #  Follow-up with me in 4 weks/ IV magnesium/ labs/ rituxan.

## 2015-08-21 NOTE — Progress Notes (Signed)
[facility] OFFICE PROGRESS NOTE  Patient Care Team: Arlis Porta., MD as PCP - General (Unknown Physician Specialty) Minna Merritts, MD as Consulting Physician (Cardiology)  No matching staging information was found for the patient.   Oncology History   # 2015- Biopsy of para-aortic lymph node is consistent with follicular lymphoma, clinically stage as 3A Diagnosis in July of 2015. Started on rituximab on July 31, 2013 2. 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  female pleasant patient above history of Recurrent follicular lymphoma/ also a right pleural effusion presumed from lymphoma is here for follow-up.  Patient is currently on single agent  rituximab monthly.   In the interim patint has not been in the hospital. Her appetite is improving onMarinol. No nausea no vomitin. Noted to have decreasing output  Pleurx catheter.  She is more active. .  Patient denies any fevers. Denies any nausea vomiting. She has mild pain in her right chest wall/Pleurx catheter area.   REVIEW OF SYSTEMS:  A complete 10 point review of system is done which is negative except mentioned above/history of present illness.   PAST MEDICAL HISTORY :  Past Medical History:  Diagnosis Date  . Anxiety   . Cataract   . Chronic chest pain    a. nonobs cath 2002.  Marland Kitchen Decreased appetite   . Depression   . DM2 (diabetes mellitus, type 2) (HCC)    insulin requiring  . Heart murmur    a. 09/2014 Echo: EF 55-60%, nl wm, mod MS, mild MR, mod dil LA, mild-mod TR.  Marland Kitchen HTN (hypertension)   . Hypercholesterolemia   . Hypomagnesemia 06/13/2014  . Irregular cardiac rhythm   . Macular degeneration   .  Murmur    hx  . Non Hodgkin's lymphoma ([place])   . Non-obstructive CAD   . Orthostatic hypotension   . Palpitations    a. 01/2011 Holter monitor showed frequent PACs, sinus bradycardia  . Tinnitus    chronic    PAST SURGICAL HISTORY :   Past Surgical History:  Procedure Laterality Date  . ANKLE SURGERY Left   . CARDIAC CATHETERIZATION  2002  . INSERTION / PLACEMENT PLEURAL CATHETER    . KNEE SURGERY Left   . PARTIAL HYSTERECTOMY    . TOOTH EXTRACTION    . TUMOR REMOVAL  2012   benign neck tumor removed    FAMILY HISTORY :   Family History  Problem Relation Age of Onset  . Heart disease Mother   . Lung cancer Brother     smoked  . Bladder Cancer Brother   . Stomach cancer Brother     SOCIAL HISTORY:   Social History  Substance Use Topics  . Smoking status: Former Smoker    Packs/day: 0.25    Years: 7.00    Types: Cigarettes    Quit date: 01/25/1988  . Smokeless tobacco: Never Used  . Alcohol use No    ALLERGIES:  is allergic to clindamycin; moxifloxacin; codeine; hydrocodone; prednisone; sulfa antibiotics; unasyn [ampicillin-sulbactam sodium]; and penicillins.  MEDICATIONS:  Current Outpatient Prescriptions  Medication Sig Dispense Refill  . ALPRAZolam Duanne Moron)  0.25 MG tablet Take 1 tablet (0.25 mg total) by mouth every 6 (six) hours as needed. 30 tablet 1  . benzonatate (TESSALON) 100 MG capsule Take 1 capsule (100 mg total) by mouth 3 (three) times daily as needed for cough. 30 capsule 0  . cefpodoxime (VANTIN) 200 MG tablet Take 1 tablet (200 mg total) by mouth 2 (two) times daily. 10 tablet 0  . chlorpheniramine-HYDROcodone (TUSSIONEX PENNKINETIC ER) 10-8 MG/5ML SUER Take 1.25 mLs by mouth 2 (two) times daily. Use only as needed for cough 60 mL 0  . cyclobenzaprine (FLEXERIL) 10 MG tablet Take 10 mg by mouth 2 (two) times daily as needed for muscle spasms. Reported on 07/15/2015    . DOK 100 MG capsule TAKE 1 CAPSULE(100 MG) BY MOUTH TWICE DAILY 60 capsule 0  .  feeding supplement, ENSURE ENLIVE, (ENSURE ENLIVE) LIQD Take 237 mLs by mouth 3 (three) times daily between meals. 237 mL 12  . fluticasone (FLONASE) 50 MCG/ACT nasal spray Place 1 spray into both nostrils daily. (Patient taking differently: Place 2 sprays into both nostrils daily. ) 16 g 6  . lidocaine-prilocaine (EMLA) cream APPLY TOPICALLY AS NEEDED. 30 g 0  . meclizine (ANTIVERT) 12.5 MG tablet Take 12.5 mg by mouth 3 (three) times daily as needed.    . megestrol (MEGACE) 40 MG/ML suspension Take 10 mLs (400 mg total) by mouth daily. 240 mL 3  . mirtazapine (REMERON) 15 MG tablet TAKE 1 TABLET(15 MG) BY MOUTH AT BEDTIME (Patient taking differently: TAKE 1 TABLET(15 MG) BY MOUTH AT BEDTIME AS NEEDED FOR SLEEP.) 90 tablet 1  . Multiple Vitamin (MULTIVITAMIN WITH MINERALS) TABS tablet Take 1 tablet by mouth daily.    . nitroGLYCERIN (NITROSTAT) 0.4 MG SL tablet Place 0.4 mg under the tongue every 5 (five) minutes x 3 doses as needed for chest pain. Reported on 07/15/2015    . pantoprazole (PROTONIX) 40 MG tablet Take 1 tablet (40 mg total) by mouth daily. (Patient taking differently: Take 40 mg by mouth daily as needed. FOR HEARTBURN/INDIGESTION.) 30 tablet 6  . polyethylene glycol powder (GLYCOLAX/MIRALAX) powder Take 17 g by mouth daily as needed. 3350 g 0  . SLOW-MAG 71.5-119 MG TBEC SR tablet TAKE 1 TABLET (64 MG TOTAL) BY MOUTH 2 (TWO) TIMES DAILY. 60 tablet 3  . traMADol (ULTRAM) 50 MG tablet Take 1 tablet by mouth every 4-6 hours as needed for pain 90 tablet 0   No current facility-administered medications for this visit.    Facility-Administered Medications Ordered in Other Visits  Medication Dose Route Frequency Provider Last Rate Last Dose  . heparin lock flush 100 unit/mL  500 Units Intravenous Once Forest Gleason, MD      . sodium chloride 0.9 % injection 10 mL  10 mL Intravenous PRN Forest Gleason, MD   10 mL at 07/24/14 1020  . sodium chloride 0.9 % injection 10 mL  10 mL Intravenous PRN  Forest Gleason, MD   10 mL at 08/12/14 0834    PHYSICAL EXAMINATION: ECOG PERFORMANCE STATUS: 2 - Symptomatic, <50% confined to bed  BP (!) 169/81 (BP Location: Left Arm, Patient Position: Sitting)   Pulse 80   Temp 97.8 F (36.6 C) (Tympanic)   Resp 18   Wt 114 lb 5 oz (51.9 kg)   BMI 19.93 kg/m   Filed Weights   08/21/15 0945  Weight: 114 lb 5 oz (51.9 kg)    GENERAL: [place] Caucasian female patient; Alert, no distress and comfortable.  Accompanied by her daughter in a wheelchair  EYES: no pallor or icterus OROPHARYNX: no thrush or ulceration; dentures NECK: supple, no masses felt LYMPH:  no palpable lymphadenopathy in the cervical, axillary or inguinal regions LUNGS: Decreased breath sounds on the right side compared to the left  No wheeze or crackles HEART/CVS: regular rate & rhythm and no murmurs; No lower extremity edema ABDOMEN:abdomen soft, non-tender and normal bowel sounds Musculoskeletal:no cyanosis of digits and no clubbing  PSYCH: alert & oriented x 3 with fluent speech NEURO: no focal motor/sensory deficits SKIN:  no rashes or significant lesions  LABORATORY DATA:  I have reviewed the data as listed    Component Value Date/Time   NA 133 (L) 08/21/2015 0909   NA 134 (L) 05/15/2014 0855   K 3.9 08/21/2015 0909   K 3.3 (L) 05/15/2014 0855   CL 102 08/21/2015 0909   CL 101 05/15/2014 0855   CO2 22 08/21/2015 0909   CO2 27 05/15/2014 0855   GLUCOSE 192 (H) 08/21/2015 0909   GLUCOSE 213 (H) 05/15/2014 0855   BUN 15 08/21/2015 0909   BUN 9 05/15/2014 0855   CREATININE 0.87 08/21/2015 0909   CREATININE 0.75 05/15/2014 0855   CREATININE 0.82 04/22/2014   CALCIUM 8.3 (L) 08/21/2015 0909   CALCIUM 8.1 (L) 05/15/2014 0855   PROT 6.5 08/21/2015 0909   PROT 5.3 (L) 05/15/2014 0855   ALBUMIN 3.0 (L) 08/21/2015 0909   ALBUMIN 2.7 (L) 05/15/2014 0855   AST 19 08/21/2015 0909   AST 31 05/15/2014 0855   ALT 10 (L) 08/21/2015 0909   ALT 14 05/15/2014 0855    ALKPHOS 81 08/21/2015 0909   ALKPHOS 104 05/15/2014 0855   BILITOT 0.7 08/21/2015 0909   BILITOT 0.6 05/15/2014 0855   GFRNONAA 55 (L) 08/21/2015 0909   GFRNONAA >60 05/15/2014 0855   GFRAA >60 08/21/2015 0909   GFRAA >60 05/15/2014 0855    No results found for: SPEP, UPEP  Lab Results  Component Value Date   WBC 2.1 (L) 08/21/2015   NEUTROABS 0.6 (L) 08/21/2015   HGB 11.9 (L) 08/21/2015   HCT 35.1 08/21/2015   MCV 83.9 08/21/2015   PLT 185 08/21/2015      Chemistry      Component Value Date/Time   NA 133 (L) 08/21/2015 0909   NA 134 (L) 05/15/2014 0855   K 3.9 08/21/2015 0909   K 3.3 (L) 05/15/2014 0855   CL 102 08/21/2015 0909   CL 101 05/15/2014 0855   CO2 22 08/21/2015 0909   CO2 27 05/15/2014 0855   BUN 15 08/21/2015 0909   BUN 9 05/15/2014 0855   CREATININE 0.87 08/21/2015 0909   CREATININE 0.75 05/15/2014 0855   CREATININE 0.82 04/22/2014      Component Value Date/Time   CALCIUM 8.3 (L) 08/21/2015 0909   CALCIUM 8.1 (L) 05/15/2014 0855   ALKPHOS 81 08/21/2015 0909   ALKPHOS 104 05/15/2014 0855   AST 19 08/21/2015 0909   AST 31 05/15/2014 0855   ALT 10 (L) 08/21/2015 0909   ALT 14 05/15/2014 0855   BILITOT 0.7 08/21/2015 0909   BILITOT 0.6 05/15/2014 0855       RADIOGRAPHIC STUDIES: I have personally reviewed the radiological images as listed and agreed with the findings in the report. No results found.   ASSESSMENT & PLAN:  Follicular lymphoma grade II of intra-abdominal lymph nodes ([place]) # Follicular lymphoma G-2  With recurrence- and right external iliac lymph node  [facility]  2017 on PET scan]; however recurrent pleural effusion-possibly related to lymphoma.  Currently on single agent rituximab a monthly basis.  # right pleural effusion- ? Related to lymphoma-  250cc in 10 days..   # Cough-cxr-recent- ? Pme[place]oai/ s/p z-pak.  # Neutropenia- ANC 800 recently; [place] from today is pending. Question medication versus bone marrow infiltration. Monitor  for now  # Hypomagnesemia- 1.5/  Recommend  Medication IV  # Cachexia poor appetite- weight loss-  On Marinol improving   #  Follow-up with me in 4 weks/ IV magnesium/ labs/ rituxan.    Orders Placed This Encounter  Procedures  . CBC with Differential    Standing Status:   Future    Standing Expiration Date:   08/20/2016  . Comprehensive metabolic panel    Standing Status:   Future    Standing Expiration Date:   08/20/2016  . Magnesium    Standing Status:   Future    Standing Expiration Date:   08/20/2016   All questions were answered. The patient knows to call the clinic with any problems, questions or concerns.      Cammie Sickle, MD 08/21/2015 6:32 PM

## 2015-08-21 NOTE — Progress Notes (Signed)
ANC: 600. MD, Dr. Rogue Bussing, notified via telephone. Proceed with treatment today per MD order.

## 2015-08-21 NOTE — Progress Notes (Signed)
Patient had pneumonia about 4 weeks ago.  Still has cough.  States when she takes a deep breath she feels SOB.  O2 96%.  Patient finished Z-Pak 3 days ago.

## 2015-09-01 ENCOUNTER — Inpatient Hospital Stay: Admit: 2015-09-01 | Payer: Self-pay

## 2015-09-01 ENCOUNTER — Encounter: Payer: Self-pay | Admitting: Family Medicine

## 2015-09-01 ENCOUNTER — Ambulatory Visit (INDEPENDENT_AMBULATORY_CARE_PROVIDER_SITE_OTHER): Payer: Medicare Other | Admitting: Family Medicine

## 2015-09-01 ENCOUNTER — Ambulatory Visit
Admission: RE | Admit: 2015-09-01 | Discharge: 2015-09-01 | Disposition: A | Discharge status: Home / Self Care | Payer: Medicare Other | Source: Ambulatory Visit | Attending: Family Medicine | Admitting: Family Medicine

## 2015-09-01 VITALS — BP 135/65 | HR 76 | Temp 98.1°F | Resp 16 | Ht 63.5 in | Wt 116.0 lb

## 2015-09-01 DIAGNOSIS — J90 Pleural effusion, not elsewhere classified: Secondary | ICD-10-CM | POA: Insufficient documentation

## 2015-09-01 NOTE — Progress Notes (Signed)
Name: Caroline Russo   MRN: [id number]    DOB: March 16, 1919   Date:09/01/2015       Progress Note  Subjective  Chief Complaint  Chief Complaint  Patient presents with  . Pleural Effusion    HPI Here for f/u of pleural effusions.  Her cough continues.  She has finished antibiotics.  No fever.  Cough occ productive (white sputum).  She has not tried Tussionex.  No problem-specific Assessment & Plan notes found for this encounter.   Past Medical History:  Diagnosis Date  . Anxiety   . Cataract   . Chronic chest pain    a. nonobs cath 2002.  Marland Kitchen Decreased appetite   . Depression   . DM2 (diabetes mellitus, type 2) (HCC)    insulin requiring  . Heart murmur    a. 09/2014 Echo: EF 55-60%, nl wm, mod MS, mild MR, mod dil LA, mild-mod TR.  Marland Kitchen HTN (hypertension)   . Hypercholesterolemia   . Hypomagnesemia 06/13/2014  . Irregular cardiac rhythm   . Macular degeneration   . Murmur    hx  . Non Hodgkin's lymphoma ([place])   . Non-obstructive CAD   . Orthostatic hypotension   . Palpitations    a. 01/2011 Holter monitor showed frequent PACs, sinus bradycardia  . Tinnitus    chronic    Past Surgical History:  Procedure Laterality Date  . ANKLE SURGERY Left   . CARDIAC CATHETERIZATION  2002  . INSERTION / PLACEMENT PLEURAL CATHETER    . KNEE SURGERY Left   . PARTIAL HYSTERECTOMY    . TOOTH EXTRACTION    . TUMOR REMOVAL  2012   benign neck tumor removed    Family History  Problem Relation Age of Onset  . Heart disease Mother   . Lung cancer Brother     smoked  . Bladder Cancer Brother   . Stomach cancer Brother     Social History   Social History  . Marital status: Widowed    Spouse name: N/A  . Number of children: N/A  . Years of education: N/A   Occupational History  . Retired Retired   Social History Main Topics  . Smoking status: Former Smoker    Packs/day: 0.25    Years: 7.00    Types: Cigarettes    Quit date: 01/25/1988  . Smokeless tobacco: Never Used  .  Alcohol use No  . Drug use: No  . Sexual activity: Not on file   Other Topics Concern  . Not on file   Social History Narrative   Retired. Widowed. 3 children, 7 grandchildren, 2 great- grandchildren.      Current Outpatient Prescriptions:  .  ALPRAZolam (XANAX) 0.25 MG tablet, Take 1 tablet (0.25 mg total) by mouth every 6 (six) hours as needed., Disp: 30 tablet, Rfl: 1 .  benzonatate (TESSALON) 100 MG capsule, TAKE 1 CAPSULE(100 MG) BY MOUTH THREE TIMES DAILY AS NEEDED FOR COUGH, Disp: 30 capsule, Rfl: 6 .  cyclobenzaprine (FLEXERIL) 10 MG tablet, Take 10 mg by mouth 2 (two) times daily as needed for muscle spasms. Reported on 07/15/2015, Disp: , Rfl:  .  DOK 100 MG capsule, TAKE 1 CAPSULE(100 MG) BY MOUTH TWICE DAILY, Disp: 60 capsule, Rfl: 0 .  feeding supplement, ENSURE ENLIVE, (ENSURE ENLIVE) LIQD, Take 237 mLs by mouth 3 (three) times daily between meals., Disp: 237 mL, Rfl: 12 .  fluticasone (FLONASE) 50 MCG/ACT nasal spray, Place 1 spray into both nostrils daily. (Patient taking  differently: Place 2 sprays into both nostrils daily. ), Disp: 16 g, Rfl: 6 .  lidocaine-prilocaine (EMLA) cream, APPLY TOPICALLY AS NEEDED., Disp: 30 g, Rfl: 0 .  meclizine (ANTIVERT) 12.5 MG tablet, Take 12.5 mg by mouth 3 (three) times daily as needed., Disp: , Rfl:  .  megestrol (MEGACE) 40 MG/ML suspension, Take 10 mLs (400 mg total) by mouth daily., Disp: 240 mL, Rfl: 3 .  mirtazapine (REMERON) 15 MG tablet, TAKE 1 TABLET(15 MG) BY MOUTH AT BEDTIME (Patient taking differently: TAKE 1 TABLET(15 MG) BY MOUTH AT BEDTIME AS NEEDED FOR SLEEP.), Disp: 90 tablet, Rfl: 1 .  Multiple Vitamin (MULTIVITAMIN WITH MINERALS) TABS tablet, Take 1 tablet by mouth daily., Disp: , Rfl:  .  nitroGLYCERIN (NITROSTAT) 0.4 MG SL tablet, Place 0.4 mg under the tongue every 5 (five) minutes x 3 doses as needed for chest pain. Reported on 07/15/2015, Disp: , Rfl:  .  pantoprazole (PROTONIX) 40 MG tablet, Take 1 tablet (40 mg  total) by mouth daily. (Patient taking differently: Take 40 mg by mouth daily as needed. FOR HEARTBURN/INDIGESTION.), Disp: 30 tablet, Rfl: 6 .  polyethylene glycol powder (GLYCOLAX/MIRALAX) powder, Take 17 g by mouth daily as needed., Disp: 3350 g, Rfl: 0 .  SLOW-MAG 71.5-119 MG TBEC SR tablet, TAKE 1 TABLET (64 MG TOTAL) BY MOUTH 2 (TWO) TIMES DAILY., Disp: 60 tablet, Rfl: 3 .  traMADol (ULTRAM) 50 MG tablet, Take 1 tablet by mouth every 4-6 hours as needed for pain, Disp: 90 tablet, Rfl: 0 No current facility-administered medications for this visit.   Facility-Administered Medications Ordered in Other Visits:  .  heparin lock flush 100 unit/mL, 500 Units, Intravenous, Once, Forest Gleason, MD .  sodium chloride 0.9 % injection 10 mL, 10 mL, Intravenous, PRN, Forest Gleason, MD, 10 mL at 07/24/14 1020 .  sodium chloride 0.9 % injection 10 mL, 10 mL, Intravenous, PRN, Forest Gleason, MD, 10 mL at 08/12/14 0834  Allergies  Allergen Reactions  . Clindamycin Shortness Of Breath  . Moxifloxacin Shortness Of Breath    hallucinations  . Codeine Other (See Comments)    unknown  . Hydrocodone Other (See Comments)  . Prednisone     Hallucinations  . Sulfa Antibiotics Other (See Comments)  . Unasyn [Ampicillin-Sulbactam Sodium] Other (See Comments)    unknown  . Penicillins Itching and Rash    Has patient had a PCN reaction causing immediate rash, facial/tongue/throat swelling, SOB or lightheadedness with hypotension: Yes Has patient had a PCN reaction causing severe rash involving mucus membranes or skin necrosis: No Has patient had a PCN reaction that required hospitalization No Has patient had a PCN reaction occurring within the last 10 years: No If all of the above answers are "NO", then may proceed with Cephalosporin use.       Review of Systems  Constitutional: Positive for malaise/fatigue. Negative for chills, fever and weight loss.  HENT: Negative for hearing loss.   Eyes: Negative for  blurred vision and double vision.  Respiratory: Positive for cough, sputum production (white) and shortness of breath (with activity). Negative for wheezing.   Cardiovascular: Negative for chest pain and palpitations.  Gastrointestinal: Negative for abdominal pain, blood in stool and heartburn.  Genitourinary: Negative for dysuria, frequency and urgency.  Skin: Negative for rash.  Neurological: Negative for tremors, weakness and headaches.      Objective  Vitals:   09/01/15 1109 09/01/15 1129  BP: (!) 185/68 135/65  Pulse: (!) 51 76  Resp: 16  Temp: 98.1 F (36.7 C)   TempSrc: Oral   SpO2: 96% 96%  Weight: 116 lb (52.6 kg)   Height: 5' 3.5" (1.613 m)     Physical Exam  Constitutional: She is oriented to person, place, and time and well-developed, well-nourished, and in no distress. No distress.  HENT:  Head: Normocephalic and atraumatic.  Neck: Normal range of motion. Neck supple. Carotid bruit is not present. No thyromegaly present.  Cardiovascular: Normal rate.  An irregularly irregular rhythm present.  Murmur heard.  Systolic murmur is present with a grade of 2/6  throughout  Pulmonary/Chest: Effort normal. She has no wheezes. She has no rales.  Slight decreased breath sounds at bases.  Musculoskeletal: She exhibits no edema.  Lymphadenopathy:    She has no cervical adenopathy.  Neurological: She is alert and oriented to person, place, and time.  Vitals reviewed.      Recent Results (from the past 2160 hour(s))  Comprehensive metabolic panel     Status: Abnormal   Collection Time: 07/07/15 11:00 AM  Result Value Ref Range   Sodium 131 (L) 135 - 145 mmol/L   Potassium 3.5 3.5 - 5.1 mmol/L   Chloride 97 (L) 101 - 111 mmol/L   CO2 26 22 - 32 mmol/L   Glucose, Bld 219 (H) 65 - 99 mg/dL   BUN 10 6 - 20 mg/dL   Creatinine, Ser 0.88 0.44 - 1.00 mg/dL   Calcium 8.3 (L) 8.9 - 10.3 mg/dL   Total Protein 6.6 6.5 - 8.1 g/dL   Albumin 3.3 (L) 3.5 - 5.0 g/dL   AST  23 15 - 41 U/L   ALT 10 (L) 14 - 54 U/L   Alkaline Phosphatase 93 38 - 126 U/L   Total Bilirubin 0.7 0.3 - 1.2 mg/dL   GFR calc non Af Amer 55 (L) >60 mL/min   GFR calc Af Amer >60 >60 mL/min    Comment: (NOTE) The eGFR has been calculated using the CKD EPI equation. This calculation has not been validated in all clinical situations. eGFR's persistently <60 mL/min signify possible Chronic Kidney Disease.    Anion gap 8 5 - 15  Magnesium     Status: Abnormal   Collection Time: 07/07/15 11:00 AM  Result Value Ref Range   Magnesium 1.3 (L) 1.7 - 2.4 mg/dL  CBC with Differential/Platelet     Status: Abnormal   Collection Time: 07/07/15 11:00 AM  Result Value Ref Range   WBC 1.9 (L) 3.6 - 11.0 K/uL   RBC 4.32 3.80 - 5.20 MIL/uL   Hemoglobin 12.3 12.0 - 16.0 g/dL   HCT 36.7 35.0 - 47.0 %   MCV 85.0 80.0 - 100.0 fL   MCH 28.4 26.0 - 34.0 pg   MCHC 33.5 32.0 - 36.0 g/dL   RDW 15.8 (H) 11.5 - 14.5 %   Platelets 164 150 - 440 K/uL   Neutrophils Relative % 36 %   Neutro Abs 0.7 (L) 1.4 - 6.5 K/uL   Lymphocytes Relative 34 %   Lymphs Abs 0.7 (L) 1.0 - 3.6 K/uL   Monocytes Relative 28 %   Monocytes Absolute 0.5 0.2 - 0.9 K/uL   Eosinophils Relative 1 %   Eosinophils Absolute 0.0 0 - 0.7 K/uL   Basophils Relative 1 %   Basophils Absolute 0.0 0 - 0.1 K/uL   Smear Review SMEAR SCANNED   CBC with Differential     Status: Abnormal   Collection Time: 07/15/15  1:00 PM  Result Value Ref Range   WBC 3.0 (L) 3.6 - 11.0 K/uL   RBC 4.16 3.80 - 5.20 MIL/uL   Hemoglobin 11.6 (L) 12.0 - 16.0 g/dL   HCT 35.1 35.0 - 47.0 %   MCV 84.3 80.0 - 100.0 fL   MCH 28.0 26.0 - 34.0 pg   MCHC 33.2 32.0 - 36.0 g/dL   RDW 15.3 (H) 11.5 - 14.5 %   Platelets 258 150 - 440 K/uL   Neutrophils Relative % 64 %   Neutro Abs 1.9 1.4 - 6.5 K/uL   Lymphocytes Relative 22 %   Lymphs Abs 0.7 (L) 1.0 - 3.6 K/uL   Monocytes Relative 12 %   Monocytes Absolute 0.4 0.2 - 0.9 K/uL   Eosinophils Relative 1 %    Eosinophils Absolute 0.0 0 - 0.7 K/uL   Basophils Relative 1 %   Basophils Absolute 0.0 0 - 0.1 K/uL  Comprehensive metabolic panel     Status: Abnormal   Collection Time: 07/15/15  1:00 PM  Result Value Ref Range   Sodium 130 (L) 135 - 145 mmol/L   Potassium 3.9 3.5 - 5.1 mmol/L   Chloride 95 (L) 101 - 111 mmol/L   CO2 26 22 - 32 mmol/L   Glucose, Bld 188 (H) 65 - 99 mg/dL   BUN 16 6 - 20 mg/dL   Creatinine, Ser 1.00 0.44 - 1.00 mg/dL   Calcium 8.4 (L) 8.9 - 10.3 mg/dL   Total Protein 6.4 (L) 6.5 - 8.1 g/dL   Albumin 2.9 (L) 3.5 - 5.0 g/dL   AST 79 (H) 15 - 41 U/L   ALT 61 (H) 14 - 54 U/L   Alkaline Phosphatase 129 (H) 38 - 126 U/L   Total Bilirubin 0.7 0.3 - 1.2 mg/dL   GFR calc non Af Amer 47 (L) >60 mL/min   GFR calc Af Amer 54 (L) >60 mL/min    Comment: (NOTE) The eGFR has been calculated using the CKD EPI equation. This calculation has not been validated in all clinical situations. eGFR's persistently <60 mL/min signify possible Chronic Kidney Disease.    Anion gap 9 5 - 15  Magnesium     Status: Abnormal   Collection Time: 07/15/15  1:00 PM  Result Value Ref Range   Magnesium 1.6 (L) 1.7 - 2.4 mg/dL  Lactic acid, plasma     Status: None   Collection Time: 07/19/15  2:00 PM  Result Value Ref Range   Lactic Acid, Venous 1.3 0.5 - 2.0 mmol/L  Comprehensive metabolic panel     Status: Abnormal   Collection Time: 07/19/15  2:00 PM  Result Value Ref Range   Sodium 130 (L) 135 - 145 mmol/L   Potassium 3.9 3.5 - 5.1 mmol/L   Chloride 94 (L) 101 - 111 mmol/L   CO2 27 22 - 32 mmol/L   Glucose, Bld 161 (H) 65 - 99 mg/dL   BUN 12 6 - 20 mg/dL   Creatinine, Ser 0.83 0.44 - 1.00 mg/dL   Calcium 8.2 (L) 8.9 - 10.3 mg/dL   Total Protein 5.9 (L) 6.5 - 8.1 g/dL   Albumin 2.5 (L) 3.5 - 5.0 g/dL   AST 31 15 - 41 U/L   ALT 28 14 - 54 U/L   Alkaline Phosphatase 100 38 - 126 U/L   Total Bilirubin 0.7 0.3 - 1.2 mg/dL   GFR calc non Af Amer 59 (L) >60 mL/min   GFR calc Af Amer  >60 >60 mL/min  Comment: (NOTE) The eGFR has been calculated using the CKD EPI equation. This calculation has not been validated in all clinical situations. eGFR's persistently <60 mL/min signify possible Chronic Kidney Disease.    Anion gap 9 5 - 15  Troponin I     Status: None   Collection Time: 07/19/15  2:00 PM  Result Value Ref Range   Troponin I <0.03 <0.031 ng/mL    Comment:        NO INDICATION OF MYOCARDIAL INJURY.   CBC WITH DIFFERENTIAL     Status: Abnormal   Collection Time: 07/19/15  2:00 PM  Result Value Ref Range   WBC 2.2 (L) 3.6 - 11.0 K/uL   RBC 3.98 3.80 - 5.20 MIL/uL   Hemoglobin 11.6 (L) 12.0 - 16.0 g/dL   HCT 33.2 (L) 35.0 - 47.0 %   MCV 83.5 80.0 - 100.0 fL   MCH 29.3 26.0 - 34.0 pg   MCHC 35.0 32.0 - 36.0 g/dL   RDW 15.3 (H) 11.5 - 14.5 %   Platelets 255 150 - 440 K/uL   Neutrophils Relative % 35 %   Lymphocytes Relative 45 %   Monocytes Relative 15 %   Eosinophils Relative 2 %   Basophils Relative 0 %   Band Neutrophils 0 %   Metamyelocytes Relative 2 %   Myelocytes 1 %   Promyelocytes Absolute 0 %   Blasts 0 %   nRBC 0 0 /100 WBC   Other 0 %   Neutro Abs 0.8 (L) 1.4 - 6.5 K/uL   Lymphs Abs 1.1 1.0 - 3.6 K/uL   Monocytes Absolute 0.3 0.2 - 0.9 K/uL   Eosinophils Absolute 0.0 0 - 0.7 K/uL   Basophils Absolute 0.0 0 - 0.1 K/uL   RBC Morphology MIXED RBC POPULATION    Smear Review      PATH REVIEW HAS BEEN ORDERED ON THIS ACCESSION NUMBER  Blood Culture (routine x 2)     Status: None   Collection Time: 07/19/15  2:00 PM  Result Value Ref Range   Specimen Description BLOOD PORT    Special Requests BOTTLES DRAWN AEROBIC AND ANAEROBIC  2CC    Culture NO GROWTH 5 DAYS    Report Status 07/24/2015 FINAL   Blood Culture (routine x 2)     Status: None   Collection Time: 07/19/15  2:00 PM  Result Value Ref Range   Specimen Description BLOOD LEFT ANTECUBITAL    Special Requests BOTTLES DRAWN AEROBIC AND ANAEROBIC  1CC    Culture NO GROWTH 5  DAYS    Report Status 07/24/2015 FINAL   Urinalysis complete, with microscopic ([facility] only)     Status: Abnormal   Collection Time: 07/19/15  2:00 PM  Result Value Ref Range   Color, Urine YELLOW (A) YELLOW   APPearance CLEAR (A) CLEAR   Glucose, UA NEGATIVE NEGATIVE mg/dL   Bilirubin Urine NEGATIVE NEGATIVE   Ketones, ur NEGATIVE NEGATIVE mg/dL   Specific Gravity, Urine 1.013 1.005 - 1.030   Hgb urine dipstick NEGATIVE NEGATIVE   pH 6.0 5.0 - 8.0   Protein, ur NEGATIVE NEGATIVE mg/dL   Nitrite NEGATIVE NEGATIVE   Leukocytes, UA NEGATIVE NEGATIVE   RBC / HPF 0-5 0 - 5 RBC/hpf   WBC, UA 0-5 0 - 5 WBC/hpf   Bacteria, UA NONE SEEN NONE SEEN   Squamous Epithelial / LPF 0-5 (A) NONE SEEN   Mucous PRESENT   Urine culture     Status: Abnormal  Collection Time: 07/19/15  2:00 PM  Result Value Ref Range   Specimen Description URINE, RANDOM    Special Requests NONE    Culture MULTIPLE SPECIES PRESENT, SUGGEST RECOLLECTION (A)    Report Status 07/20/2015 FINAL   Pathologist smear review     Status: None   Collection Time: 07/19/15  2:00 PM  Result Value Ref Range   Path Review      Peripheral smear review shows a leukopenia that is baseline per [facility]. Rare myeloid blasts are seen and normocytic anemia. Dr. Luana Shu.  Lactic acid, plasma     Status: None   Collection Time: 07/19/15  4:33 PM  Result Value Ref Range   Lactic Acid, Venous 1.3 0.5 - 2.0 mmol/L  TSH     Status: None   Collection Time: 07/19/15  4:33 PM  Result Value Ref Range   TSH 0.747 0.350 - 4.500 uIU/mL  CBC     Status: Abnormal   Collection Time: 07/20/15  4:18 AM  Result Value Ref Range   WBC 1.6 (L) 3.6 - 11.0 K/uL   RBC 3.48 (L) 3.80 - 5.20 MIL/uL   Hemoglobin 10.0 (L) 12.0 - 16.0 g/dL   HCT 29.1 (L) 35.0 - 47.0 %   MCV 83.6 80.0 - 100.0 fL   MCH 28.7 26.0 - 34.0 pg   MCHC 34.3 32.0 - 36.0 g/dL   RDW 15.2 (H) 11.5 - 14.5 %   Platelets 204 150 - 440 K/uL  Basic metabolic panel     Status: Abnormal   Collection  Time: 07/20/15  4:18 AM  Result Value Ref Range   Sodium 134 (L) 135 - 145 mmol/L   Potassium 3.4 (L) 3.5 - 5.1 mmol/L   Chloride 103 101 - 111 mmol/L   CO2 25 22 - 32 mmol/L   Glucose, Bld 125 (H) 65 - 99 mg/dL   BUN 8 6 - 20 mg/dL   Creatinine, Ser 0.62 0.44 - 1.00 mg/dL   Calcium 7.5 (L) 8.9 - 10.3 mg/dL   GFR calc non Af Amer >60 >60 mL/min   GFR calc Af Amer >60 >60 mL/min    Comment: (NOTE) The eGFR has been calculated using the CKD EPI equation. This calculation has not been validated in all clinical situations. eGFR's persistently <60 mL/min signify possible Chronic Kidney Disease.    Anion gap 6 5 - 15  CBC     Status: Abnormal   Collection Time: 07/21/15  4:30 AM  Result Value Ref Range   WBC 1.6 (L) 3.6 - 11.0 K/uL   RBC 3.68 (L) 3.80 - 5.20 MIL/uL   Hemoglobin 10.4 (L) 12.0 - 16.0 g/dL   HCT 30.5 (L) 35.0 - 47.0 %   MCV 82.7 80.0 - 100.0 fL   MCH 28.2 26.0 - 34.0 pg   MCHC 34.1 32.0 - 36.0 g/dL   RDW 15.3 (H) 11.5 - 14.5 %   Platelets 219 150 - 440 K/uL  Basic metabolic panel     Status: Abnormal   Collection Time: 07/21/15  4:30 AM  Result Value Ref Range   Sodium 134 (L) 135 - 145 mmol/L   Potassium 3.7 3.5 - 5.1 mmol/L   Chloride 103 101 - 111 mmol/L   CO2 25 22 - 32 mmol/L   Glucose, Bld 111 (H) 65 - 99 mg/dL   BUN 7 6 - 20 mg/dL   Creatinine, Ser 0.65 0.44 - 1.00 mg/dL   Calcium 7.9 (L) 8.9 - 10.3 mg/dL  GFR calc non Af Amer >60 >60 mL/min   GFR calc Af Amer >60 >60 mL/min    Comment: (NOTE) The eGFR has been calculated using the CKD EPI equation. This calculation has not been validated in all clinical situations. eGFR's persistently <60 mL/min signify possible Chronic Kidney Disease.    Anion gap 6 5 - 15  CBC with Differential     Status: Abnormal   Collection Time: 07/24/15  3:00 PM  Result Value Ref Range   WBC 2.1 (L) 3.6 - 11.0 K/uL   RBC 4.09 3.80 - 5.20 MIL/uL   Hemoglobin 11.4 (L) 12.0 - 16.0 g/dL   HCT 34.0 (L) 35.0 - 47.0 %   MCV  83.2 80.0 - 100.0 fL   MCH 27.8 26.0 - 34.0 pg   MCHC 33.5 32.0 - 36.0 g/dL   RDW 15.5 (H) 11.5 - 14.5 %   Platelets 272 150 - 440 K/uL   Neutrophils Relative % 28% %   Neutro Abs 0.6 (L) 1.4 - 6.5 K/uL   Lymphocytes Relative 47% %   Lymphs Abs 1.0 1.0 - 3.6 K/uL   Monocytes Relative 14% %   Monocytes Absolute 0.3 0.2 - 0.9 K/uL   Eosinophils Relative 9% %   Eosinophils Absolute 0.2 0 - 0.7 K/uL   Basophils Relative 2% %   Basophils Absolute 0.0 0 - 0.1 K/uL   Smear Review SMEAR SCANNED   Comprehensive metabolic panel     Status: Abnormal   Collection Time: 07/24/15  3:00 PM  Result Value Ref Range   Sodium 136 135 - 145 mmol/L   Potassium 4.6 3.5 - 5.1 mmol/L   Chloride 100 (L) 101 - 111 mmol/L   CO2 31 22 - 32 mmol/L   Glucose, Bld 170 (H) 65 - 99 mg/dL   BUN 10 6 - 20 mg/dL   Creatinine, Ser 0.68 0.44 - 1.00 mg/dL   Calcium 8.6 (L) 8.9 - 10.3 mg/dL   Total Protein 6.0 (L) 6.5 - 8.1 g/dL   Albumin 2.8 (L) 3.5 - 5.0 g/dL   AST 28 15 - 41 U/L   ALT 21 14 - 54 U/L   Alkaline Phosphatase 108 38 - 126 U/L   Total Bilirubin 0.3 0.3 - 1.2 mg/dL   GFR calc non Af Amer >60 >60 mL/min   GFR calc Af Amer >60 >60 mL/min    Comment: (NOTE) The eGFR has been calculated using the CKD EPI equation. This calculation has not been validated in all clinical situations. eGFR's persistently <60 mL/min signify possible Chronic Kidney Disease.    Anion gap 5 5 - 15  Magnesium     Status: None   Collection Time: 07/24/15  3:00 PM  Result Value Ref Range   Magnesium 1.8 1.7 - 2.4 mg/dL  CBC with Differential     Status: Abnormal   Collection Time: 08/21/15  9:09 AM  Result Value Ref Range   WBC 2.1 (L) 3.6 - 11.0 K/uL   RBC 4.19 3.80 - 5.20 MIL/uL   Hemoglobin 11.9 (L) 12.0 - 16.0 g/dL   HCT 35.1 35.0 - 47.0 %   MCV 83.9 80.0 - 100.0 fL   MCH 28.4 26.0 - 34.0 pg   MCHC 33.8 32.0 - 36.0 g/dL   RDW 17.3 (H) 11.5 - 14.5 %   Platelets 185 150 - 400 K/uL    Comment: PLATELET CLUMPS NOTED  ON SMEAR, COUNT APPEARS ADEQUATE   Neutrophils Relative % 29 %  Neutro Abs 0.6 (L) 1.4 - 6.5 K/uL   Lymphocytes Relative 45 %   Lymphs Abs 0.9 (L) 1.0 - 3.6 K/uL   Monocytes Relative 18 %   Monocytes Absolute 0.4 0.2 - 0.9 K/uL   Eosinophils Relative 6 %   Eosinophils Absolute 0.1 0 - 0.7 K/uL   Basophils Relative 3 %   Basophils Absolute 0.1 0 - 0.1 K/uL   Smear Review SMEAR SCANNED     Comment: ACLUMP  Comprehensive metabolic panel     Status: Abnormal   Collection Time: 08/21/15  9:09 AM  Result Value Ref Range   Sodium 133 (L) 135 - 145 mmol/L   Potassium 3.9 3.5 - 5.1 mmol/L   Chloride 102 101 - 111 mmol/L   CO2 22 22 - 32 mmol/L   Glucose, Bld 192 (H) 65 - 99 mg/dL   BUN 15 6 - 20 mg/dL   Creatinine, Ser 0.87 0.44 - 1.00 mg/dL   Calcium 8.3 (L) 8.9 - 10.3 mg/dL   Total Protein 6.5 6.5 - 8.1 g/dL   Albumin 3.0 (L) 3.5 - 5.0 g/dL   AST 19 15 - 41 U/L   ALT 10 (L) 14 - 54 U/L   Alkaline Phosphatase 81 38 - 126 U/L   Total Bilirubin 0.7 0.3 - 1.2 mg/dL   GFR calc non Af Amer 55 (L) >60 mL/min   GFR calc Af Amer >60 >60 mL/min    Comment: (NOTE) The eGFR has been calculated using the CKD EPI equation. This calculation has not been validated in all clinical situations. eGFR's persistently <60 mL/min signify possible Chronic Kidney Disease.    Anion gap 9 5 - 15  Lactate dehydrogenase     Status: Abnormal   Collection Time: 08/21/15  9:09 AM  Result Value Ref Range   LDH 295 (H) 98 - 192 U/L  Magnesium     Status: Abnormal   Collection Time: 08/21/15  9:09 AM  Result Value Ref Range   Magnesium 1.5 (L) 1.7 - 2.4 mg/dL     Assessment & Plan  Problem List Items Addressed This Visit      Respiratory   Pleural effusion, bilateral - Primary   Relevant Orders   DG Chest 2 View    Other Visit Diagnoses   None.     No orders of the defined types were placed in this encounter. 1. Pleural effusion, bilateral  - DG Chest 2 View; Future

## 2015-09-01 NOTE — Patient Instructions (Signed)
Use the Tussionex at 1/4 tsp (1.25 cc) twice daily for cough.  Will refer to Dr. Rockey Situ if effusions still present.

## 2015-09-02 ENCOUNTER — Other Ambulatory Visit: Payer: Self-pay | Admitting: Family Medicine

## 2015-09-02 DIAGNOSIS — J90 Pleural effusion, not elsewhere classified: Secondary | ICD-10-CM

## 2015-09-02 DIAGNOSIS — J9811 Atelectasis: Secondary | ICD-10-CM

## 2015-09-10 ENCOUNTER — Encounter: Payer: Self-pay | Admitting: Cardiovascular Disease

## 2015-09-10 ENCOUNTER — Ambulatory Visit: Payer: Medicare Other | Admitting: Family Medicine

## 2015-09-10 ENCOUNTER — Ambulatory Visit (INDEPENDENT_AMBULATORY_CARE_PROVIDER_SITE_OTHER): Payer: Medicare Other | Admitting: Cardiovascular Disease

## 2015-09-10 VITALS — BP 138/62 | HR 67 | Ht 62.0 in | Wt 116.5 lb

## 2015-09-10 DIAGNOSIS — J209 Acute bronchitis, unspecified: Secondary | ICD-10-CM | POA: Diagnosis not present

## 2015-09-10 DIAGNOSIS — C8213 Follicular lymphoma grade II, intra-abdominal lymph nodes: Secondary | ICD-10-CM

## 2015-09-10 DIAGNOSIS — I5032 Chronic diastolic (congestive) heart failure: Secondary | ICD-10-CM

## 2015-09-10 DIAGNOSIS — R0602 Shortness of breath: Secondary | ICD-10-CM

## 2015-09-10 DIAGNOSIS — I10 Essential (primary) hypertension: Secondary | ICD-10-CM | POA: Diagnosis not present

## 2015-09-10 DIAGNOSIS — J90 Pleural effusion, not elsewhere classified: Secondary | ICD-10-CM

## 2015-09-10 MED ORDER — LEVOFLOXACIN 500 MG PO TABS: 500.0000 mg | ORAL_TABLET | Freq: Every day | ORAL | 0 refills | Status: DC

## 2015-09-10 MED ORDER — ALBUTEROL SULFATE HFA 108 (90 BASE) MCG/ACT IN AERS: 1.0000 | INHALATION_SPRAY | RESPIRATORY_TRACT | 2 refills | Status: DC | PRN

## 2015-09-10 NOTE — Progress Notes (Signed)
Cardiology Office Note  Date:  09/10/2015   ID:  Zyiah, Gelpi 11-30-1919, MRN [id number]  PCP:  Dicky Doe, MD   Chief Complaint  Patient presents with  . Other    Follow up per Dr. Luan Pulling due to a hospital follow up from June. Would like evaluated for CHF. Meds reviewed by the patient verbally.     HPI:  Ms. Ginger is a pleasant 82 year old woman with hypertension, nonobstructive coronary artery disease in 2002, diabetes, obesity who presents for routine followup of her coronary artery disease, hypertension   Diagnosis of lymphoma, Chemotherapy has been completed,  she is on Rituxan every 3 months Chronic dizziness,  with previous Blood pressure measurements showing orthostasis.   HCTZ was held in the past, losartan dose was decreased.   In follow-up today she presents with her daughter Daughter reports that inMay 2017, she developedviral URI Symptoms of Fever, diagnosed withPNA Went to hospital, June 2017 Treated with Z-Pak in the hospital June 2017 Seen in follow-up,similar systems of cough, shortness of breath, sputum tried vantin but had side effects Had repeat zpak  Presents today, daughter is frustrated, reports that symptoms have persisted Thick productive cough, sputum  CXR reviewed in office, images pulled up with patient Bases relatively clear, no significant signs of congestive heart failure Minimal effusion  EKG on today's visit shows normal sinus rhythm with rate 67 bpm, rare APC  Nonspecific ST abnormality  Other past medical history reviewed Previous Holter monitor showing APCs, PVCs, rare short runs of atrial tachycardia  fall while in the bathroom 06/09/2014, hit her right eye History ofrecurrent pleural effusion, with Pleurx catheter.  Weight continues to trend lower, previously 158 pounds, down to 150 pounds,  130 pounds, now 122 pounds  Previously treated with Rituxan, then Treanda and Rituxan with steroids  In the hospital May  2015 CT scan showed moderate right pleural effusion, diffuse adenopathy CT scan of the head without metastases Hospital records from 11/11/2013 detail diagnosis of follicular lymphoma with chronic right-sided pleural effusion She was discharged from the hospital October 20, admission on October 17  Chronic problem with her vision. Hearing problem  She has macular degeneration.   Previous history of palpitations and event monitor was performed that showed occasional sinus bradycardia., Normal sinus rhythm mostly with frequent APCs. No other significant arrhythmia  PMH:   has a past medical history of Anxiety; Cataract; Chronic chest pain; Decreased appetite; Depression; DM2 (diabetes mellitus, type 2) ([place]); Heart murmur; HTN (hypertension); Hypercholesterolemia; Hypomagnesemia (06/13/2014); Irregular cardiac rhythm; Macular degeneration; Murmur; Non Hodgkin's lymphoma ([place]); Non-obstructive CAD; Orthostatic hypotension; Palpitations; and Tinnitus.  PSH:    Past Surgical History:  Procedure Laterality Date  . ANKLE SURGERY Left   . CARDIAC CATHETERIZATION  2002  . INSERTION / PLACEMENT PLEURAL CATHETER    . KNEE SURGERY Left   . PARTIAL HYSTERECTOMY    . TOOTH EXTRACTION    . TUMOR REMOVAL  2012   benign neck tumor removed    Current Outpatient Prescriptions  Medication Sig Dispense Refill  . ALPRAZolam (XANAX) 0.25 MG tablet Take 1 tablet (0.25 mg total) by mouth every 6 (six) hours as needed. 30 tablet 1  . benzonatate (TESSALON) 100 MG capsule TAKE 1 CAPSULE(100 MG) BY MOUTH THREE TIMES DAILY AS NEEDED FOR COUGH 30 capsule 6  . cyclobenzaprine (FLEXERIL) 10 MG tablet Take 10 mg by mouth 2 (two) times daily as needed for muscle spasms. Reported on 07/15/2015    . [place]  100 MG capsule TAKE 1 CAPSULE(100 MG) BY MOUTH TWICE DAILY 60 capsule 0  . feeding supplement, ENSURE ENLIVE, (ENSURE ENLIVE) LIQD Take 237 mLs by mouth 3 (three) times daily between meals. 237 mL 12  . fluticasone  (FLONASE) 50 MCG/ACT nasal spray Place 1 spray into both nostrils daily. (Patient taking differently: Place 2 sprays into both nostrils daily. ) 16 g 6  . lidocaine-prilocaine (EMLA) cream APPLY TOPICALLY AS NEEDED. 30 g 0  . meclizine (ANTIVERT) 12.5 MG tablet Take 12.5 mg by mouth 3 (three) times daily as needed.    . megestrol (MEGACE) 40 MG/ML suspension Take 10 mLs (400 mg total) by mouth daily. 240 mL 3  . Multiple Vitamin (MULTIVITAMIN WITH MINERALS) TABS tablet Take 1 tablet by mouth daily.    . nitroGLYCERIN (NITROSTAT) 0.4 MG SL tablet Place 0.4 mg under the tongue every 5 (five) minutes x 3 doses as needed for chest pain. Reported on 07/15/2015    . pantoprazole (PROTONIX) 40 MG tablet Take 1 tablet (40 mg total) by mouth daily. (Patient taking differently: Take 40 mg by mouth daily as needed. FOR HEARTBURN/INDIGESTION.) 30 tablet 6  . polyethylene glycol powder (GLYCOLAX/MIRALAX) powder Take 17 g by mouth daily as needed. 3350 g 0  . SLOW-MAG 71.5-119 MG TBEC SR tablet TAKE 1 TABLET (64 MG TOTAL) BY MOUTH 2 (TWO) TIMES DAILY. 60 tablet 3  . traMADol (ULTRAM) 50 MG tablet Take 1 tablet by mouth every 4-6 hours as needed for pain 90 tablet 0  . albuterol (PROVENTIL HFA;VENTOLIN HFA) 108 (90 Base) MCG/ACT inhaler Inhale 1-2 puffs into the lungs every 4 (four) hours as needed for wheezing or shortness of breath. 1 Inhaler 2  . levofloxacin (LEVAQUIN) 500 MG tablet Take 1 tablet (500 mg total) by mouth daily. 10 tablet 0   No current facility-administered medications for this visit.    Facility-Administered Medications Ordered in Other Visits  Medication Dose Route Frequency Provider Last Rate Last Dose  . heparin lock flush 100 unit/mL  500 Units Intravenous Once Forest Gleason, MD      . sodium chloride 0.9 % injection 10 mL  10 mL Intravenous PRN Forest Gleason, MD   10 mL at 07/24/14 1020  . sodium chloride 0.9 % injection 10 mL  10 mL Intravenous PRN Forest Gleason, MD   10 mL at 08/12/14  0834     Allergies:   Clindamycin; Moxifloxacin; Codeine; Hydrocodone; Prednisone; Sulfa antibiotics; Unasyn [ampicillin-sulbactam sodium]; and Penicillins   Social History:  The patient  reports that she quit smoking about 27 years ago. Her smoking use included Cigarettes. She has a 1.75 pack-year smoking history. She has never used smokeless tobacco. She reports that she does not drink alcohol or use drugs.   Family History:   family history includes Bladder Cancer in her brother; Heart disease in her mother; Lung cancer in her brother; Stomach cancer in her brother.    Review of Systems: Review of Systems  Respiratory: Positive for cough, sputum production and shortness of breath.   Cardiovascular: Negative.   Gastrointestinal: Negative.   Musculoskeletal: Negative.   Neurological: Positive for weakness.  Psychiatric/Behavioral: Negative.   All other systems reviewed and are negative.    PHYSICAL EXAM: VS:  BP 138/62 (BP Location: Left Arm, Patient Position: Sitting, Cuff Size: Normal)   Pulse 67   Ht 5\' 2"  (1.575 m)   Wt 116 lb 8 oz (52.8 kg)   BMI 21.31 kg/m  , BMI Body  mass index is 21.31 kg/m. GEN: Well nourished, well developed, in no acute distress, significant loose,upper airway sounding cough throughout her visit  HEENT: normal  Neck: no JVD, carotid bruits, or masses Cardiac: RRR; no murmurs, rubs, or gallops,no edema  Respiratory:  clear to auscultation bilaterally, normal work of breathing GI: soft, nontender, nondistended, + BS MS: no deformity or atrophy  Skin: warm and dry, no rash Neuro:  Strength and sensation are intact Psych: euthymic mood, full affect    Recent Labs: 07/19/2015: TSH 0.747 08/21/2015: ALT 10; BUN 15; Creatinine, Ser 0.87; Hemoglobin 11.9; Magnesium 1.5; Platelets 185; Potassium 3.9; Sodium 133    Lipid Panel No results found for: CHOL, HDL, LDLCALC, TRIG    Wt Readings from Last 3 Encounters:  09/10/15 116 lb 8 oz (52.8 kg)   09/01/15 116 lb (52.6 kg)  08/21/15 114 lb 5 oz (51.9 kg)       ASSESSMENT AND PLAN:  Diastolic CHF, chronic (HCC) - Plan: EKG 12-Lead Appears relatively euvolemic on today's visit, no leg edema, lungs relatively clear even down to the bases No medication changes made  Essential hypertension, benign - Plan: EKG 12-Lead Blood pressure is well controlled on today's visit. No changes made to the medications.  Acute bronchitis, unspecified organism Recently treated with Z-Pak 2 with no improvement of symptoms dating back to May 2017. Long discussion with daughter about various treatment options. She will prefer a more aggressive approach given that her mother has had symptoms going on 3 months. Patient having difficulty sleeping secondary to cough.  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 EKG 12-Lead     Signed, Esmond Plants, M.D., Ph.D. 09/10/2015  [place], [place]

## 2015-09-10 NOTE — Patient Instructions (Signed)
Medication Instructions:   Please start levaquin once a day for 10 days Take albuterol as needed for shortness of breath  Labwork:  No new labs  Testing/Procedures:  No new testing  Follow-Up: It was a pleasure seeing you in the office today. Please call us if you have new issues that need to be addressed before your next appt.  [phone]  Your physician wants you to follow-up in: 6 months.  You will receive a reminder letter in the mail two months in advance. If you don't receive a letter, please call our office to schedule the follow-up appointment.  If you need a refill on your cardiac medications before your next appointment, please call your pharmacy.

## 2015-09-18 ENCOUNTER — Inpatient Hospital Stay (HOSPITAL_BASED_OUTPATIENT_CLINIC_OR_DEPARTMENT_OTHER): Payer: Medicare Other | Admitting: Internal Medicine

## 2015-09-18 ENCOUNTER — Encounter: Payer: Self-pay | Admitting: Internal Medicine

## 2015-09-18 ENCOUNTER — Inpatient Hospital Stay: Payer: Medicare Other

## 2015-09-18 ENCOUNTER — Inpatient Hospital Stay: Payer: Medicare Other | Attending: Internal Medicine

## 2015-09-18 ENCOUNTER — Encounter (INDEPENDENT_AMBULATORY_CARE_PROVIDER_SITE_OTHER): Payer: Self-pay

## 2015-09-18 VITALS — BP 126/76 | HR 76 | Temp 97.8°F | Resp 19 | Ht 62.0 in | Wt 115.7 lb

## 2015-09-18 DIAGNOSIS — J40 Bronchitis, not specified as acute or chronic: Secondary | ICD-10-CM | POA: Diagnosis not present

## 2015-09-18 DIAGNOSIS — F329 Major depressive disorder, single episode, unspecified: Secondary | ICD-10-CM

## 2015-09-18 DIAGNOSIS — Z5112 Encounter for antineoplastic immunotherapy: Secondary | ICD-10-CM | POA: Insufficient documentation

## 2015-09-18 DIAGNOSIS — I10 Essential (primary) hypertension: Secondary | ICD-10-CM | POA: Diagnosis not present

## 2015-09-18 DIAGNOSIS — C8213 Follicular lymphoma grade II, intra-abdominal lymph nodes: Secondary | ICD-10-CM

## 2015-09-18 DIAGNOSIS — F419 Anxiety disorder, unspecified: Secondary | ICD-10-CM | POA: Diagnosis not present

## 2015-09-18 DIAGNOSIS — R002 Palpitations: Secondary | ICD-10-CM | POA: Insufficient documentation

## 2015-09-18 DIAGNOSIS — Z794 Long term (current) use of insulin: Secondary | ICD-10-CM

## 2015-09-18 DIAGNOSIS — Z801 Family history of malignant neoplasm of trachea, bronchus and lung: Secondary | ICD-10-CM | POA: Insufficient documentation

## 2015-09-18 DIAGNOSIS — Z79899 Other long term (current) drug therapy: Secondary | ICD-10-CM | POA: Diagnosis not present

## 2015-09-18 DIAGNOSIS — E119 Type 2 diabetes mellitus without complications: Secondary | ICD-10-CM | POA: Insufficient documentation

## 2015-09-18 DIAGNOSIS — Z87891 Personal history of nicotine dependence: Secondary | ICD-10-CM

## 2015-09-18 DIAGNOSIS — R079 Chest pain, unspecified: Secondary | ICD-10-CM | POA: Diagnosis not present

## 2015-09-18 DIAGNOSIS — Z8052 Family history of malignant neoplasm of bladder: Secondary | ICD-10-CM

## 2015-09-18 DIAGNOSIS — R64 Cachexia: Secondary | ICD-10-CM

## 2015-09-18 DIAGNOSIS — J90 Pleural effusion, not elsewhere classified: Secondary | ICD-10-CM | POA: Insufficient documentation

## 2015-09-18 DIAGNOSIS — C8599 Non-Hodgkin lymphoma, unspecified, extranodal and solid organ sites: Secondary | ICD-10-CM

## 2015-09-18 DIAGNOSIS — R634 Abnormal weight loss: Secondary | ICD-10-CM

## 2015-09-18 DIAGNOSIS — Z800 Family history of malignant neoplasm of digestive organs: Secondary | ICD-10-CM | POA: Insufficient documentation

## 2015-09-18 DIAGNOSIS — C8590 Non-Hodgkin lymphoma, unspecified, unspecified site: Secondary | ICD-10-CM

## 2015-09-18 LAB — CBC WITH DIFFERENTIAL/PLATELET
BASOS ABS: 0 10*3/uL (ref 0–0.1)
BASOS PCT: 1 %
EOS ABS: 0.2 10*3/uL (ref 0–0.7)
Eosinophils Relative: 6 %
HCT: 35 % (ref 35.0–47.0)
HEMOGLOBIN: 11.8 g/dL — AB (ref 12.0–16.0)
LYMPHS PCT: 39 %
Lymphs Abs: 1.1 10*3/uL (ref 1.0–3.6)
MCH: 27.9 pg (ref 26.0–34.0)
MCHC: 33.8 g/dL (ref 32.0–36.0)
MCV: 82.6 fL (ref 80.0–100.0)
MONO ABS: 0.6 10*3/uL (ref 0.2–0.9)
Monocytes Relative: 19 %
NEUTROS PCT: 35 %
Neutro Abs: 1.1 10*3/uL — ABNORMAL LOW (ref 1.4–6.5)
PLATELETS: 193 10*3/uL (ref 150–440)
RBC: 4.24 MIL/uL (ref 3.80–5.20)
RDW: 17.4 % — ABNORMAL HIGH (ref 11.5–14.5)
WBC: 3 10*3/uL — AB (ref 3.6–11.0)

## 2015-09-18 LAB — COMPREHENSIVE METABOLIC PANEL
ALBUMIN: 3.1 g/dL — AB (ref 3.5–5.0)
ALK PHOS: 70 U/L (ref 38–126)
ALT: 9 U/L — AB (ref 14–54)
ANION GAP: 6 (ref 5–15)
AST: 18 U/L (ref 15–41)
BUN: 17 mg/dL (ref 6–20)
CHLORIDE: 105 mmol/L (ref 101–111)
CO2: 24 mmol/L (ref 22–32)
CREATININE: 0.76 mg/dL (ref 0.44–1.00)
Calcium: 8.4 mg/dL — ABNORMAL LOW (ref 8.9–10.3)
GFR calc non Af Amer: >60 mL/min (ref >60)
GLUCOSE: 125 mg/dL — AB (ref 65–99)
Potassium: 3.7 mmol/L (ref 3.5–5.1)
SODIUM: 135 mmol/L (ref 135–145)
Total Bilirubin: 0.6 mg/dL (ref 0.3–1.2)
Total Protein: 6.3 g/dL — ABNORMAL LOW (ref 6.5–8.1)

## 2015-09-18 LAB — MAGNESIUM: Magnesium: 1.6 mg/dL — ABNORMAL LOW (ref 1.7–2.4)

## 2015-09-18 MED ORDER — DIPHENHYDRAMINE HCL 25 MG PO CAPS
25.0000 mg | ORAL_CAPSULE | Freq: Once | ORAL | Status: AC
  Administered 2015-09-18: 25 mg via ORAL

## 2015-09-18 MED ORDER — ACETAMINOPHEN 325 MG PO TABS
650.0000 mg | ORAL_TABLET | Freq: Once | ORAL | Status: AC
  Administered 2015-09-18: 650 mg via ORAL

## 2015-09-18 MED ORDER — MAGNESIUM SULFATE 2 GM/50ML IV SOLN
2.0000 g | Freq: Once | INTRAVENOUS | Status: AC
  Administered 2015-09-18: 2 g via INTRAVENOUS
  Filled 2015-09-18: qty 50

## 2015-09-18 MED ORDER — SODIUM CHLORIDE 0.9 % IJ SOLN
10.0000 mL | Freq: Once | INTRAMUSCULAR | Status: AC
  Administered 2015-09-18: 10 mL via INTRAVENOUS
  Filled 2015-09-18: qty 10

## 2015-09-18 MED ORDER — SODIUM CHLORIDE 0.9 % IV SOLN: 375.0000 mg/m2 | Freq: Once | INTRAVENOUS | Status: DC

## 2015-09-18 MED ORDER — SODIUM CHLORIDE 0.9 % IV SOLN
Freq: Once | INTRAVENOUS | Status: AC
  Administered 2015-09-18: 20 mL/h via INTRAVENOUS
  Filled 2015-09-18: qty 1000

## 2015-09-18 MED ORDER — HEPARIN SOD (PORK) LOCK FLUSH 100 UNIT/ML IV SOLN
500.0000 [IU] | Freq: Once | INTRAVENOUS | Status: AC
  Administered 2015-09-18: 500 [IU] via INTRAVENOUS
  Filled 2015-09-18: qty 5

## 2015-09-18 MED ORDER — SODIUM CHLORIDE 0.9 % IV SOLN
375.0000 mg/m2 | Freq: Once | INTRAVENOUS | Status: AC
  Administered 2015-09-18: 600 mg via INTRAVENOUS
  Filled 2015-09-18: qty 50

## 2015-09-18 NOTE — Progress Notes (Signed)
[facility] OFFICE PROGRESS NOTE  Patient Care Team: Arlis Porta., MD as PCP - General (Unknown Physician Specialty) Minna Merritts, MD as Consulting Physician (Cardiology)  No matching staging information was found for the patient.   Oncology History   # 2015- Biopsy of para-aortic lymph node is consistent with follicular lymphoma, clinically stage as 3A Diagnosis in July of 2015. Started on rituximab on July 31, 2013 2. 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  female pleasant patient above history of Recurrent follicular lymphoma/ also a right pleural effusion presumed from lymphoma is here for follow-up.  Patient is currently on single agent  rituximab monthly.  Patient recently diagnosed with bronchitis status post antibiotics/also on Tessalon [place] for cough.  Her appetite is improving onMarinol. No nausea no vomitin. Noted to have decreasing output  Pleurx catheter. She is gaining weight..  Patient denies any fevers. Denies any nausea vomiting. She has mild pain in her right chest wall/Pleurx catheter area.   REVIEW OF SYSTEMS:  A complete 10 point review of system is done which is negative except mentioned above/history of present illness.   PAST MEDICAL HISTORY :  Past Medical History:  Diagnosis Date  . Anxiety   . Cataract   . Chronic chest pain    a. nonobs cath 2002.  Marland Kitchen Decreased appetite   . Depression   . DM2 (diabetes mellitus, type 2) (HCC)    insulin requiring  . Heart murmur    a. 09/2014 Echo: EF 55-60%, nl wm, mod MS, mild MR, mod dil LA, mild-mod TR.  Marland Kitchen HTN (hypertension)   . Hypercholesterolemia   . Hypomagnesemia 06/13/2014  .  Irregular cardiac rhythm   . Macular degeneration   . Murmur    hx  . Non Hodgkin's lymphoma ([place])   . Non-obstructive CAD   . Orthostatic hypotension   . Palpitations    a. 01/2011 Holter monitor showed frequent PACs, sinus bradycardia  . Tinnitus    chronic    PAST SURGICAL HISTORY :   Past Surgical History:  Procedure Laterality Date  . ANKLE SURGERY Left   . CARDIAC CATHETERIZATION  2002  . INSERTION / PLACEMENT PLEURAL CATHETER    . KNEE SURGERY Left   . PARTIAL HYSTERECTOMY    . TOOTH EXTRACTION    . TUMOR REMOVAL  2012   benign neck tumor removed    FAMILY HISTORY :   Family History  Problem Relation Age of Onset  . Heart disease Mother   . Lung cancer Brother     smoked  . Bladder Cancer Brother   . Stomach cancer Brother     SOCIAL HISTORY:   Social History  Substance Use Topics  . Smoking status: Former Smoker    Packs/day: 0.25    Years: 7.00    Types: Cigarettes    Quit date: 01/25/1988  . Smokeless tobacco: Never Used  . Alcohol use No    ALLERGIES:  is allergic to clindamycin; moxifloxacin; codeine; hydrocodone; prednisone; sulfa antibiotics; unasyn [ampicillin-sulbactam sodium]; and penicillins.  MEDICATIONS:  Current Outpatient Prescriptions  Medication Sig Dispense Refill  .  albuterol (PROVENTIL HFA;VENTOLIN HFA) 108 (90 Base) MCG/ACT inhaler Inhale 1-2 puffs into the lungs every 4 (four) hours as needed for wheezing or shortness of breath. 1 Inhaler 2  . ALPRAZolam (XANAX) 0.25 MG tablet Take 1 tablet (0.25 mg total) by mouth every 6 (six) hours as needed. 30 tablet 1  . benzonatate (TESSALON) 100 MG capsule TAKE 1 CAPSULE(100 MG) BY MOUTH THREE TIMES DAILY AS NEEDED FOR COUGH 30 capsule 6  . cyclobenzaprine (FLEXERIL) 10 MG tablet Take 10 mg by mouth 2 (two) times daily as needed for muscle spasms. Reported on 07/15/2015    . [place] 100 MG capsule TAKE 1 CAPSULE(100 MG) BY MOUTH TWICE DAILY 60 capsule 0  . feeding supplement, ENSURE ENLIVE,  (ENSURE ENLIVE) LIQD Take 237 mLs by mouth 3 (three) times daily between meals. 237 mL 12  . fluticasone (FLONASE) 50 MCG/ACT nasal spray Place 1 spray into both nostrils daily. (Patient taking differently: Place 2 sprays into both nostrils daily. ) 16 g 6  . levofloxacin (LEVAQUIN) 500 MG tablet Take 1 tablet (500 mg total) by mouth daily. 10 tablet 0  . lidocaine-prilocaine (EMLA) cream APPLY TOPICALLY AS NEEDED. 30 g 0  . meclizine (ANTIVERT) 12.5 MG tablet Take 12.5 mg by mouth 3 (three) times daily as needed.    . megestrol (MEGACE) 40 MG/ML suspension Take 10 mLs (400 mg total) by mouth daily. 240 mL 3  . Multiple Vitamin (MULTIVITAMIN WITH MINERALS) TABS tablet Take 1 tablet by mouth daily.    . pantoprazole (PROTONIX) 40 MG tablet Take 1 tablet (40 mg total) by mouth daily. (Patient taking differently: Take 40 mg by mouth daily as needed. FOR HEARTBURN/INDIGESTION.) 30 tablet 6  . polyethylene glycol powder (GLYCOLAX/MIRALAX) powder Take 17 g by mouth daily as needed. 3350 g 0  . SLOW-MAG 71.5-119 MG TBEC SR tablet TAKE 1 TABLET (64 MG TOTAL) BY MOUTH 2 (TWO) TIMES DAILY. 60 tablet 3  . traMADol (ULTRAM) 50 MG tablet Take 1 tablet by mouth every 4-6 hours as needed for pain 90 tablet 0  . nitroGLYCERIN (NITROSTAT) 0.4 MG SL tablet Place 0.4 mg under the tongue every 5 (five) minutes x 3 doses as needed for chest pain. Reported on 07/15/2015     No current facility-administered medications for this visit.    Facility-Administered Medications Ordered in Other Visits  Medication Dose Route Frequency Provider Last Rate Last Dose  . heparin lock flush 100 unit/mL  500 Units Intravenous Once Forest Gleason, MD      . sodium chloride 0.9 % injection 10 mL  10 mL Intravenous PRN Forest Gleason, MD   10 mL at 07/24/14 1020  . sodium chloride 0.9 % injection 10 mL  10 mL Intravenous PRN Forest Gleason, MD   10 mL at 08/12/14 0834    PHYSICAL EXAMINATION: ECOG PERFORMANCE STATUS: 2 - Symptomatic, <50%  confined to bed  BP 126/76 (BP Location: Left Arm, Patient Position: Sitting)   Pulse 76   Temp 97.8 F (36.6 C) (Tympanic)   Resp 19   Ht 5\' 2"  (1.575 m)   Wt 115 lb 11.2 oz (52.5 kg)   BMI 21.16 kg/m   Filed Weights   09/18/15 1000  Weight: 115 lb 11.2 oz (52.5 kg)    GENERAL: [place] Caucasian female patient; Alert, no distress and comfortable.  Accompanied by her daughter in a wheelchair  EYES: no pallor or icterus OROPHARYNX: no thrush or ulceration; dentures NECK: supple, no masses felt LYMPH:  no palpable lymphadenopathy in the cervical, axillary or inguinal regions LUNGS: Decreased breath sounds on the right side compared to the left  No wheeze or crackles HEART/CVS: regular rate & rhythm and no murmurs; No lower extremity edema ABDOMEN:abdomen soft, non-tender and normal bowel sounds Musculoskeletal:no cyanosis of digits and no clubbing  PSYCH: alert & oriented x 3 with fluent speech NEURO: no focal motor/sensory deficits SKIN:  no rashes or significant lesions  LABORATORY DATA:  I have reviewed the data as listed    Component Value Date/Time   NA 135 09/18/2015 0910   NA 134 (L) 05/15/2014 0855   K 3.7 09/18/2015 0910   K 3.3 (L) 05/15/2014 0855   CL 105 09/18/2015 0910   CL 101 05/15/2014 0855   CO2 24 09/18/2015 0910   CO2 27 05/15/2014 0855   GLUCOSE 125 (H) 09/18/2015 0910   GLUCOSE 213 (H) 05/15/2014 0855   BUN 17 09/18/2015 0910   BUN 9 05/15/2014 0855   CREATININE 0.76 09/18/2015 0910   CREATININE 0.75 05/15/2014 0855   CREATININE 0.82 04/22/2014   CALCIUM 8.4 (L) 09/18/2015 0910   CALCIUM 8.1 (L) 05/15/2014 0855   PROT 6.3 (L) 09/18/2015 0910   PROT 5.3 (L) 05/15/2014 0855   ALBUMIN 3.1 (L) 09/18/2015 0910   ALBUMIN 2.7 (L) 05/15/2014 0855   AST 18 09/18/2015 0910   AST 31 05/15/2014 0855   ALT 9 (L) 09/18/2015 0910   ALT 14 05/15/2014 0855   ALKPHOS 70 09/18/2015 0910   ALKPHOS 104 05/15/2014 0855   BILITOT 0.6 09/18/2015 0910    BILITOT 0.6 05/15/2014 0855   GFRNONAA >60 09/18/2015 0910   GFRNONAA >60 05/15/2014 0855   GFRAA >60 09/18/2015 0910   GFRAA >60 05/15/2014 0855    No results found for: SPEP, UPEP  Lab Results  Component Value Date   WBC 3.0 (L) 09/18/2015   NEUTROABS 1.1 (L) 09/18/2015   HGB 11.8 (L) 09/18/2015   HCT 35.0 09/18/2015   MCV 82.6 09/18/2015   PLT 193 09/18/2015      Chemistry      Component Value Date/Time   NA 135 09/18/2015 0910   NA 134 (L) 05/15/2014 0855   K 3.7 09/18/2015 0910   K 3.3 (L) 05/15/2014 0855   CL 105 09/18/2015 0910   CL 101 05/15/2014 0855   CO2 24 09/18/2015 0910   CO2 27 05/15/2014 0855   BUN 17 09/18/2015 0910   BUN 9 05/15/2014 0855   CREATININE 0.76 09/18/2015 0910   CREATININE 0.75 05/15/2014 0855   CREATININE 0.82 04/22/2014      Component Value Date/Time   CALCIUM 8.4 (L) 09/18/2015 0910   CALCIUM 8.1 (L) 05/15/2014 0855   ALKPHOS 70 09/18/2015 0910   ALKPHOS 104 05/15/2014 0855   AST 18 09/18/2015 0910   AST 31 05/15/2014 0855   ALT 9 (L) 09/18/2015 0910   ALT 14 05/15/2014 0855   BILITOT 0.6 09/18/2015 0910   BILITOT 0.6 05/15/2014 0855       RADIOGRAPHIC STUDIES: I have personally reviewed the radiological images as listed and agreed with the findings in the report. No results found.   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  Currently on single agent rituximab a monthly basis. Currently no progression noted.   # right  pleural effusion- ? Related to lymphoma-  200 ml- 4 weeks.   # Cough-/bronchitis- improving. Tessalon pearls/ albuterol inhaler.   # Hypomagnesemia- 1.6/  Recommend  Medication IV  # Cachexia poor appetite- weight loss-  On Marinol improving   #  Follow-up with me in 4 weks/ IV magnesium/  labs/ rituxan.    Orders Placed This Encounter  Procedures  . CBC with Differential    Standing Status:   Future    Standing Expiration Date:   09/17/2016  . Comprehensive metabolic panel    Standing Status:   Future    Standing Expiration Date:   09/17/2016  . Magnesium    Standing Status:   Future    Standing Expiration Date:   09/17/2016   All questions were answered. The patient knows to call the clinic with any problems, questions or concerns.      Cammie Sickle, MD 09/18/2015 6:27 PM

## 2015-09-18 NOTE — Progress Notes (Signed)
Bronchitis recently.  Removal of teeth on the bottom no other changes.  Reports a cough with a lot of mucus feels as if it chokes her at times.

## 2015-09-18 NOTE — Assessment & Plan Note (Addendum)
#   Follicular lymphoma G-2  With recurrence- and right external iliac lymph node  [May 2017 on PET scan]; however recurrent pleural effusion-possibly related to lymphoma.  Currently on single agent rituximab a monthly basis. Currently no progression noted.   # right pleural effusion- ? Related to lymphoma-  200 ml- 4 weeks.   # Cough-/bronchitis- improving. Tessalon pearls/ albuterol inhaler.   # Hypomagnesemia- 1.6/  Recommend  Medication IV  # Cachexia poor appetite- weight loss-  On Marinol improving   #  Follow-up with me in 4 weks/ IV magnesium/ labs/ rituxan.

## 2015-09-24 ENCOUNTER — Other Ambulatory Visit: Payer: Self-pay | Admitting: *Deleted

## 2015-09-24 MED ORDER — DOCUSATE SODIUM 100 MG PO CAPS: ORAL_CAPSULE | ORAL | 3 refills | Status: DC

## 2015-09-25 ENCOUNTER — Telehealth: Payer: Self-pay | Admitting: Cardiovascular Disease

## 2015-09-25 DIAGNOSIS — R053 Chronic cough: Secondary | ICD-10-CM

## 2015-09-25 DIAGNOSIS — R05 Cough: Secondary | ICD-10-CM

## 2015-09-25 NOTE — Telephone Encounter (Signed)
Spoke with patients daughter and she reports that her mother is doing some better but that she still has congestion and a cough. She reports that Dr. Rockey Situ mentioned a referral to pulmonary if things did not improve and she wanted to call and see if we could order this. Order placed for referral and transferred to get appointment scheduled. She verbalized understanding of our conversation, agreement with plan, and had no further questions at this time.

## 2015-09-25 NOTE — Telephone Encounter (Signed)
Pt daughter called, states pt has been on Levaquin for 10 days, but still has a congested cough. She states Dr. Rockey Situ may refer her to see a pulmonologist if not gotten better. Please call and advise if this is how he wants to proceed.

## 2015-09-29 ENCOUNTER — Ambulatory Visit (INDEPENDENT_AMBULATORY_CARE_PROVIDER_SITE_OTHER): Payer: Medicare Other | Admitting: Internal Medicine

## 2015-09-29 ENCOUNTER — Encounter: Payer: Self-pay | Admitting: Internal Medicine

## 2015-09-29 VITALS — BP 122/62 | HR 96 | Ht 62.0 in | Wt 119.4 lb

## 2015-09-29 DIAGNOSIS — R059 Cough, unspecified: Secondary | ICD-10-CM

## 2015-09-29 DIAGNOSIS — R05 Cough: Secondary | ICD-10-CM

## 2015-09-29 DIAGNOSIS — R0600 Dyspnea, unspecified: Secondary | ICD-10-CM

## 2015-09-29 MED ORDER — FLUTTER DEVI: 0 refills | Status: AC

## 2015-09-29 MED ORDER — FLUTICASONE-SALMETEROL 250-50 MCG/DOSE IN AEPB: 1.0000 | INHALATION_SPRAY | Freq: Two times a day (BID) | RESPIRATORY_TRACT | 0 refills | Status: DC

## 2015-09-29 NOTE — Progress Notes (Signed)
Patient ID: Caroline Russo, female   DOB: 1919/03/20, 82 y.o.   MRN: [id number] Patient seen in the office today and instructed on use of Advair diskus.  Patient expressed understanding and demonstrated technique.

## 2015-09-29 NOTE — Progress Notes (Signed)
[facility] Consultation      Date: 09/29/2015,   MRN# [id number] Caroline Russo [birth year] Code Status:  Code Status History    Date Active Date Inactive Code Status Order ID Comments User Context   07/19/2015  3:49 PM 07/22/2015  3:10 PM DNR [id number]  Bettey Costa, MD ED    Questions for Most Recent Historical Code Status (Order [id number])    Question Answer Comment   In the event of cardiac or respiratory ARREST Do not call a "code blue"    In the event of cardiac or respiratory ARREST Do not perform Intubation, CPR, defibrillation or ACLS    In the event of cardiac or respiratory ARREST Use medication by any route, position, wound care, and other measures to relive pain and suffering. May use oxygen, suction and manual treatment of airway obstruction as needed for comfort.      Hosp day:@LENGTHOFSTAYDAYS @ Referring MD: @ATDPROV @     PCP:      AdmissionWeight: 119 lb 6.4 oz (54.2 kg)                 CurrentWeight: 119 lb 6.4 oz (54.2 kg) Caroline Russo is a 82 y.o. old female seen in consultation for chronic cough at the request of Dr. Rockey Situ.     CHIEF COMPLAINT:   cough   HISTORY OF PRESENT ILLNESS   82 yo pleasant white female seen today for chronic cough for past several months Patient states that her nurse care giver had viral infection and patient was exposed to her back in MAY Patient admitted in June 2017 for acute resp distress and SOB and dx with pneumonia-CT chest shows effusions and opacity LLL Was treated with abx and discharged, patient was subseuqently treated with several rounds of abx and was given albuterol inhaler as well Levaqiuin and the albuterol inhaler helped the most according to patient Her cough is persistent,  And occurs daily, she has occasional SOB associated with that due to productive white mucus   Patient has h/o Follicular lymphoma with RT pleural effusion s/p pleur x catheter placement-drains 200 ml every 10  days Patient undergoing chemo therapy  She is remote smoker 1.5 PPD for several years, quit 1990 Patient was sent to [place] for further evaluation I asked patient to cough and she clearly had mucus production      PAST MEDICAL HISTORY   Past Medical History:  Diagnosis Date  . Anxiety   . Cataract   . Chronic chest pain    a. nonobs cath 2002.  Marland Kitchen Decreased appetite   . Depression   . DM2 (diabetes mellitus, type 2) (HCC)    insulin requiring  . Heart murmur    a. 09/2014 Echo: EF 55-60%, nl wm, mod MS, mild MR, mod dil LA, mild-mod TR.  Marland Kitchen HTN (hypertension)   . Hypercholesterolemia   . Hypomagnesemia 06/13/2014  . Irregular cardiac rhythm   . Macular degeneration   . Murmur    hx  . Non Hodgkin's lymphoma ([place])   . Non-obstructive CAD   . Orthostatic hypotension   . Palpitations    a. 01/2011 Holter monitor showed frequent PACs, sinus bradycardia  . Tinnitus    chronic     SURGICAL HISTORY   Past Surgical History:  Procedure Laterality Date  . ANKLE SURGERY Left   . CARDIAC CATHETERIZATION  2002  . INSERTION / PLACEMENT PLEURAL CATHETER    . KNEE SURGERY Left   . PARTIAL HYSTERECTOMY    .  TOOTH EXTRACTION    . TUMOR REMOVAL  2012   benign neck tumor removed     FAMILY HISTORY   Family History  Problem Relation Age of Onset  . Heart disease Mother   . Lung cancer Brother     smoked  . Bladder Cancer Brother   . Stomach cancer Brother      SOCIAL HISTORY   Social History  Substance Use Topics  . Smoking status: Former Smoker    Packs/day: 0.25    Years: 7.00    Types: Cigarettes    Quit date: 01/25/1988  . Smokeless tobacco: Never Used  . Alcohol use No     MEDICATIONS    Home Medication:  Current Outpatient Rx  . Order #: [id number] Class: Normal  . Order #: [id number] Class: Print  . Order #: [id number] Class: Normal  . Order #: [id number] Class: Historical Med  . Order #: [id number] Class: Normal  . Order #: [id number] Class: Normal  . Order #:  [id number] Class: Normal  . Order #: [id number] Class: Normal  . Order #: [id number] Class: Historical Med  . Order #: [id number] Class: Normal  . Order #: [id number] Class: Historical Med  . Order #: [id number] Class: Historical Med  . Order #: [id number] Class: Normal  . Order #: [id number] Class: Normal  . Order #: [id number] Class: Normal  . Order #: [id number] Class: Print    Current Medication:  Current Outpatient Prescriptions:  .  albuterol (PROVENTIL HFA;VENTOLIN HFA) 108 (90 Base) MCG/ACT inhaler, Inhale 1-2 puffs into the lungs every 4 (four) hours as needed for wheezing or shortness of breath., Disp: 1 Inhaler, Rfl: 2 .  ALPRAZolam (XANAX) 0.25 MG tablet, Take 1 tablet (0.25 mg total) by mouth every 6 (six) hours as needed., Disp: 30 tablet, Rfl: 1 .  benzonatate (TESSALON) 100 MG capsule, TAKE 1 CAPSULE(100 MG) BY MOUTH THREE TIMES DAILY AS NEEDED FOR COUGH, Disp: 30 capsule, Rfl: 6 .  cyclobenzaprine (FLEXERIL) 10 MG tablet, Take 10 mg by mouth 2 (two) times daily as needed for muscle spasms. Reported on 07/15/2015, Disp: , Rfl:  .  docusate sodium (DOK) 100 MG capsule, TAKE 1 CAPSULE(100 MG) BY MOUTH TWICE DAILY, Disp: 60 capsule, Rfl: 3 .  feeding supplement, ENSURE ENLIVE, (ENSURE ENLIVE) LIQD, Take 237 mLs by mouth 3 (three) times daily between meals., Disp: 237 mL, Rfl: 12 .  fluticasone (FLONASE) 50 MCG/ACT nasal spray, Place 1 spray into both nostrils daily. (Patient taking differently: Place 2 sprays into both nostrils daily. ), Disp: 16 g, Rfl: 6 .  lidocaine-prilocaine (EMLA) cream, APPLY TOPICALLY AS NEEDED., Disp: 30 g, Rfl: 0 .  meclizine (ANTIVERT) 12.5 MG tablet, Take 12.5 mg by mouth 3 (three) times daily as needed., Disp: , Rfl:  .  megestrol (MEGACE) 40 MG/ML suspension, Take 10 mLs (400 mg total) by mouth daily., Disp: 240 mL, Rfl: 3 .  Multiple Vitamin (MULTIVITAMIN WITH MINERALS) TABS tablet, Take 1 tablet by mouth daily., Disp: , Rfl:  .  nitroGLYCERIN (NITROSTAT) 0.4 MG SL  tablet, Place 0.4 mg under the tongue every 5 (five) minutes x 3 doses as needed for chest pain. Reported on 07/15/2015, Disp: , Rfl:  .  pantoprazole (PROTONIX) 40 MG tablet, Take 1 tablet (40 mg total) by mouth daily. (Patient taking differently: Take 40 mg by mouth daily as needed. FOR HEARTBURN/INDIGESTION.), Disp: 30 tablet, Rfl: 6 .  polyethylene glycol powder (GLYCOLAX/MIRALAX) powder, Take 17 g by mouth daily as needed., Disp: 3350 g, Rfl: 0 .  SLOW-MAG 71.5-119 MG TBEC SR tablet, TAKE  1 TABLET (64 MG TOTAL) BY MOUTH 2 (TWO) TIMES DAILY., Disp: 60 tablet, Rfl: 3 .  traMADol (ULTRAM) 50 MG tablet, Take 1 tablet by mouth every 4-6 hours as needed for pain, Disp: 90 tablet, Rfl: 0 No current facility-administered medications for this visit.   Facility-Administered Medications Ordered in Other Visits:  .  heparin lock flush 100 unit/mL, 500 Units, Intravenous, Once, Forest Gleason, MD .  sodium chloride 0.9 % injection 10 mL, 10 mL, Intravenous, PRN, Forest Gleason, MD, 10 mL at 07/24/14 1020 .  sodium chloride 0.9 % injection 10 mL, 10 mL, Intravenous, PRN, Forest Gleason, MD, 10 mL at 08/12/14 0834    ALLERGIES   Clindamycin; Moxifloxacin; Codeine; Hydrocodone; Prednisone; Sulfa antibiotics; Unasyn [ampicillin-sulbactam sodium]; and Penicillins     REVIEW OF SYSTEMS   Review of Systems  Constitutional: Positive for malaise/fatigue. Negative for chills, diaphoresis, fever and weight loss.  HENT: Negative for congestion and hearing loss.   Eyes: Negative for blurred vision and double vision.  Respiratory: Positive for cough, sputum production and shortness of breath. Negative for hemoptysis and wheezing.   Cardiovascular: Negative for chest pain, palpitations, orthopnea and leg swelling.  Gastrointestinal: Negative for abdominal pain, heartburn, nausea and vomiting.  Genitourinary: Negative for dysuria and urgency.  Musculoskeletal: Negative for back pain, myalgias and neck pain.  Skin:  Negative for rash.  Neurological: Negative for dizziness, tingling, tremors, weakness and headaches.  Endo/Heme/Allergies: Does not bruise/bleed easily.  Psychiatric/Behavioral: Negative for depression, substance abuse and suicidal ideas.  All other systems reviewed and are negative.    VS: BP 122/62 (BP Location: Left Arm, Cuff Size: Normal)   Pulse 96   Ht 5\' 2"  (1.575 m)   Wt 119 lb 6.4 oz (54.2 kg)   SpO2 96%   BMI 21.84 kg/m      PHYSICAL EXAM  Physical Exam  Constitutional: She is oriented to person, place, and time. No distress.  HENT:  Head: Normocephalic and atraumatic.  Mouth/Throat: No oropharyngeal exudate.  Eyes: EOM are normal. Pupils are equal, round, and reactive to light. No scleral icterus.  Neck: Normal range of motion. Neck supple.  Cardiovascular: Normal rate, regular rhythm and normal heart sounds.   No murmur heard. Pulmonary/Chest: No stridor. No respiratory distress. She has no wheezes.  Abdominal: Soft. Bowel sounds are normal.  Musculoskeletal: Normal range of motion. She exhibits no edema.  Neurological: She is alert and oriented to person, place, and time. No cranial nerve deficit.  Skin: Skin is warm. She is not diaphoretic.  Psychiatric: She has a normal mood and affect.         IMAGING    Dg Chest 2 View  Result Date: 09/01/2015 CLINICAL DATA:  Bilateral pleural effusions. EXAM: CHEST  2 VIEW COMPARISON:  Radiographs of August 13, 2015. FINDINGS: Stable cardiomediastinal silhouette. No pneumothorax is noted. Right internal jugular Port-A-Cath is noted with distal tip in expected position of the SVC. Atherosclerosis of thoracic aorta is noted. Right-sided pleural drainage catheter is unchanged with stable mild right pleural effusion. Minimal left pleural effusion is noted. Focal airspace opacity is noted laterally in the left lung base most consistent with subsegmental atelectasis. IMPRESSION: Stable position of right-sided pleural drainage  catheter. Stable minimal to mild bilateral pleural effusions. New focal airspace opacity seen laterally in left lung base most consistent with subsegmental atelectasis. Electronically Signed   By: Marijo Conception, M.D.   On: 09/01/2015 14:23   Images reviewed 09/29/2015    ASSESSMENT/PLAN  82 yo pleasant white female with h/o of Pneumonia now with chronic productive cough likely c/w bronchitis-would consider inflammatory process ie. Eosinophilic bronchitis since patient has been on several abx and still with persistent cough, in there setting of pleural  effuions with h/o lymphoma    1. Will Start advair 2. Ventolin as needed 3. Check ONO and 6MWT 4. Flutter valve  If symptoms persist despite inhaled therapy, will proceed with Prednisone therapy   I have personally obtained a history, examined the patient, evaluated laboratory and independently reviewed imaging results, formulated the assessment and plan and placed orders.  The Patient requires high complexity decision making for assessment and support, frequent evaluation and titration of therapies, application of advanced monitoring technologies and extensive interpretation of multiple databases.   Patient/Family are satisfied with Plan of action and management. All questions answered  Corrin Parker, M.D.  [facility]  Medical Director [facility] Department

## 2015-09-29 NOTE — Patient Instructions (Addendum)
Start advair Ventolin as needed Check ONO and 6MWT Flutter valve     Cough, Adult Coughing is a reflex that clears your throat and your airways. Coughing helps to heal and protect your lungs. It is normal to cough occasionally, but a cough that happens with other symptoms or lasts a long time may be a sign of a condition that needs treatment. A cough may last only 2-3 weeks (acute), or it may last longer than 8 weeks (chronic). CAUSES Coughing is commonly caused by:  Breathing in substances that irritate your lungs.  A viral or bacterial respiratory infection.  Allergies.  Asthma.  Postnasal drip.  Smoking.  Acid backing up from the stomach into the esophagus (gastroesophageal reflux).  Certain medicines.  Chronic lung problems, including COPD (or rarely, lung cancer).  Other medical conditions such as heart failure. HOME CARE INSTRUCTIONS  Pay attention to any changes in your symptoms. Take these actions to help with your discomfort:  Take medicines only as told by your health care provider.  If you were prescribed an antibiotic medicine, take it as told by your health care provider. Do not stop taking the antibiotic even if you start to feel better.  Talk with your health care provider before you take a cough suppressant medicine.  Drink enough fluid to keep your urine clear or pale yellow.  If the air is dry, use a cold steam vaporizer or humidifier in your bedroom or your home to help loosen secretions.  Avoid anything that causes you to cough at work or at home.  If your cough is worse at night, try sleeping in a semi-upright position.  Avoid cigarette smoke. If you smoke, quit smoking. If you need help quitting, ask your health care provider.  Avoid caffeine.  Avoid alcohol.  Rest as needed. SEEK MEDICAL CARE IF:   You have new symptoms.  You cough up pus.  Your cough does not get better after 2-3 weeks, or your cough gets worse.  You cannot  control your cough with suppressant medicines and you are losing sleep.  You develop pain that is getting worse or pain that is not controlled with pain medicines.  You have a fever.  You have unexplained weight loss.  You have night sweats. SEEK IMMEDIATE MEDICAL CARE IF:  You cough up blood.  You have difficulty breathing.  Your heartbeat is very fast.   This information is not intended to replace advice given to you by your health care provider. Make sure you discuss any questions you have with your health care provider.   Document Released: 07/09/2010 Document Revised: 10/01/2014 Document Reviewed: 03/19/2014 Elsevier Interactive Patient Education [employer].

## 2015-10-05 ENCOUNTER — Ambulatory Visit (INDEPENDENT_AMBULATORY_CARE_PROVIDER_SITE_OTHER): Payer: Medicare Other | Admitting: *Deleted

## 2015-10-05 DIAGNOSIS — R0600 Dyspnea, unspecified: Secondary | ICD-10-CM | POA: Diagnosis not present

## 2015-10-05 NOTE — Progress Notes (Signed)
SMW performed today. 

## 2015-10-06 DIAGNOSIS — R0600 Dyspnea, unspecified: Secondary | ICD-10-CM | POA: Diagnosis not present

## 2015-10-16 ENCOUNTER — Telehealth: Payer: Self-pay | Admitting: Pulmonary Disease

## 2015-10-16 ENCOUNTER — Inpatient Hospital Stay: Payer: Medicare Other

## 2015-10-16 ENCOUNTER — Encounter: Payer: Self-pay | Admitting: Internal Medicine

## 2015-10-16 ENCOUNTER — Inpatient Hospital Stay: Payer: Medicare Other | Attending: Internal Medicine | Admitting: Internal Medicine

## 2015-10-16 VITALS — BP 133/76 | HR 81 | Temp 96.2°F | Resp 20 | Ht 62.0 in | Wt 119.6 lb

## 2015-10-16 DIAGNOSIS — I951 Orthostatic hypotension: Secondary | ICD-10-CM | POA: Diagnosis not present

## 2015-10-16 DIAGNOSIS — Z801 Family history of malignant neoplasm of trachea, bronchus and lung: Secondary | ICD-10-CM | POA: Diagnosis not present

## 2015-10-16 DIAGNOSIS — R079 Chest pain, unspecified: Secondary | ICD-10-CM

## 2015-10-16 DIAGNOSIS — F329 Major depressive disorder, single episode, unspecified: Secondary | ICD-10-CM | POA: Diagnosis not present

## 2015-10-16 DIAGNOSIS — Z5112 Encounter for antineoplastic immunotherapy: Secondary | ICD-10-CM | POA: Diagnosis not present

## 2015-10-16 DIAGNOSIS — R002 Palpitations: Secondary | ICD-10-CM

## 2015-10-16 DIAGNOSIS — C8213 Follicular lymphoma grade II, intra-abdominal lymph nodes: Secondary | ICD-10-CM

## 2015-10-16 DIAGNOSIS — I10 Essential (primary) hypertension: Secondary | ICD-10-CM | POA: Diagnosis not present

## 2015-10-16 DIAGNOSIS — J90 Pleural effusion, not elsewhere classified: Secondary | ICD-10-CM | POA: Diagnosis not present

## 2015-10-16 DIAGNOSIS — E119 Type 2 diabetes mellitus without complications: Secondary | ICD-10-CM | POA: Diagnosis not present

## 2015-10-16 DIAGNOSIS — R011 Cardiac murmur, unspecified: Secondary | ICD-10-CM | POA: Diagnosis not present

## 2015-10-16 DIAGNOSIS — C8590 Non-Hodgkin lymphoma, unspecified, unspecified site: Secondary | ICD-10-CM

## 2015-10-16 DIAGNOSIS — R634 Abnormal weight loss: Secondary | ICD-10-CM

## 2015-10-16 DIAGNOSIS — Z800 Family history of malignant neoplasm of digestive organs: Secondary | ICD-10-CM | POA: Diagnosis not present

## 2015-10-16 DIAGNOSIS — Z794 Long term (current) use of insulin: Secondary | ICD-10-CM

## 2015-10-16 DIAGNOSIS — F419 Anxiety disorder, unspecified: Secondary | ICD-10-CM

## 2015-10-16 DIAGNOSIS — Z87891 Personal history of nicotine dependence: Secondary | ICD-10-CM

## 2015-10-16 DIAGNOSIS — J40 Bronchitis, not specified as acute or chronic: Secondary | ICD-10-CM | POA: Diagnosis not present

## 2015-10-16 DIAGNOSIS — R630 Anorexia: Secondary | ICD-10-CM

## 2015-10-16 DIAGNOSIS — Z79899 Other long term (current) drug therapy: Secondary | ICD-10-CM | POA: Diagnosis not present

## 2015-10-16 DIAGNOSIS — C8599 Non-Hodgkin lymphoma, unspecified, extranodal and solid organ sites: Secondary | ICD-10-CM

## 2015-10-16 DIAGNOSIS — E7800 Pure hypercholesterolemia, unspecified: Secondary | ICD-10-CM

## 2015-10-16 LAB — CBC WITH DIFFERENTIAL/PLATELET
BASOS ABS: 0.1 10*3/uL (ref 0–0.1)
Basophils Relative: 3 %
EOS ABS: 0.2 10*3/uL (ref 0–0.7)
Eosinophils Relative: 7 %
HCT: 33.7 % — ABNORMAL LOW (ref 35.0–47.0)
HEMOGLOBIN: 11.5 g/dL — AB (ref 12.0–16.0)
LYMPHS PCT: 33 %
Lymphs Abs: 0.8 10*3/uL — ABNORMAL LOW (ref 1.0–3.6)
MCH: 28.5 pg (ref 26.0–34.0)
MCHC: 34.2 g/dL (ref 32.0–36.0)
MCV: 83.3 fL (ref 80.0–100.0)
MONO ABS: 0.6 10*3/uL (ref 0.2–0.9)
Monocytes Relative: 25 %
NEUTROS ABS: 0.8 10*3/uL — AB (ref 1.4–6.5)
NEUTROS PCT: 32 %
PLATELETS: 203 10*3/uL (ref 150–440)
RBC: 4.05 MIL/uL (ref 3.80–5.20)
RDW: 16.5 % — ABNORMAL HIGH (ref 11.5–14.5)
WBC: 2.5 10*3/uL — ABNORMAL LOW (ref 3.6–11.0)

## 2015-10-16 LAB — COMPREHENSIVE METABOLIC PANEL
ALBUMIN: 3.1 g/dL — AB (ref 3.5–5.0)
ALK PHOS: 81 U/L (ref 38–126)
ALT: 8 U/L — ABNORMAL LOW (ref 14–54)
ANION GAP: 7 (ref 5–15)
AST: 20 U/L (ref 15–41)
BUN: 13 mg/dL (ref 6–20)
CALCIUM: 8.6 mg/dL — AB (ref 8.9–10.3)
CHLORIDE: 108 mmol/L (ref 101–111)
CO2: 23 mmol/L (ref 22–32)
Creatinine, Ser: 0.82 mg/dL (ref 0.44–1.00)
GFR calc non Af Amer: 59 mL/min — ABNORMAL LOW (ref >60)
GLUCOSE: 165 mg/dL — AB (ref 65–99)
Potassium: 3.5 mmol/L (ref 3.5–5.1)
SODIUM: 138 mmol/L (ref 135–145)
Total Bilirubin: 0.7 mg/dL (ref 0.3–1.2)
Total Protein: 6.4 g/dL — ABNORMAL LOW (ref 6.5–8.1)

## 2015-10-16 LAB — MAGNESIUM: Magnesium: 1.5 mg/dL — ABNORMAL LOW (ref 1.7–2.4)

## 2015-10-16 MED ORDER — DIPHENHYDRAMINE HCL 25 MG PO CAPS
25.0000 mg | ORAL_CAPSULE | Freq: Once | ORAL | Status: AC
  Administered 2015-10-16: 25 mg via ORAL
  Filled 2015-10-16: qty 1

## 2015-10-16 MED ORDER — MAGNESIUM SULFATE 2 GM/50ML IV SOLN
2.0000 g | Freq: Once | INTRAVENOUS | Status: AC
  Administered 2015-10-16: 2 g via INTRAVENOUS
  Filled 2015-10-16: qty 50

## 2015-10-16 MED ORDER — SODIUM CHLORIDE 0.9 % IJ SOLN
10.0000 mL | Freq: Once | INTRAMUSCULAR | Status: AC
  Administered 2015-10-16: 10 mL via INTRAVENOUS
  Filled 2015-10-16: qty 10

## 2015-10-16 MED ORDER — HEPARIN SOD (PORK) LOCK FLUSH 100 UNIT/ML IV SOLN
500.0000 [IU] | Freq: Once | INTRAVENOUS | Status: AC
Start: 2015-10-16 — End: 2015-10-16
  Administered 2015-10-16: 500 [IU] via INTRAVENOUS
  Filled 2015-10-16: qty 5

## 2015-10-16 MED ORDER — FLUTICASONE-SALMETEROL 250-50 MCG/DOSE IN AEPB: 1.0000 | INHALATION_SPRAY | Freq: Two times a day (BID) | RESPIRATORY_TRACT | 4 refills | Status: DC

## 2015-10-16 MED ORDER — SODIUM CHLORIDE 0.9 % IV SOLN
Freq: Once | INTRAVENOUS | Status: AC
  Administered 2015-10-16: 11:00:00 via INTRAVENOUS
  Filled 2015-10-16: qty 1000

## 2015-10-16 MED ORDER — ACETAMINOPHEN 325 MG PO TABS
650.0000 mg | ORAL_TABLET | Freq: Once | ORAL | Status: AC
Start: 2015-10-16 — End: 2015-10-16
  Administered 2015-10-16: 650 mg via ORAL
  Filled 2015-10-16: qty 2

## 2015-10-16 MED ORDER — SODIUM CHLORIDE 0.9 % IV SOLN: 375.0000 mg/m2 | Freq: Once | INTRAVENOUS | Status: DC

## 2015-10-16 MED ORDER — SODIUM CHLORIDE 0.9 % IV SOLN
375.0000 mg/m2 | Freq: Once | INTRAVENOUS | Status: AC
  Administered 2015-10-16: 600 mg via INTRAVENOUS
  Filled 2015-10-16: qty 50

## 2015-10-16 MED ORDER — PREDNISONE 20 MG PO TABS: ORAL_TABLET | ORAL | 0 refills | Status: DC

## 2015-10-16 NOTE — Telephone Encounter (Signed)
Pt daughter calling stating they are at the patient cancer apt.  Was advised by Rogue Bussing to go ahead and ask for refill on Advair  If we can send in prescription for Advair to [facility] in [place] Please advise.

## 2015-10-16 NOTE — Progress Notes (Signed)
Per pt: Needs to have some teeth extracted, having a lot of SOB with and without exertion.  cough with yellow mucus today abd thick in clinic.  Pt reported as thin clear at home.

## 2015-10-16 NOTE — Progress Notes (Signed)
[facility] OFFICE PROGRESS NOTE  Patient Care Team: Arlis Porta., MD as PCP - General (Unknown Physician Specialty) Minna Merritts, MD as Consulting Physician (Cardiology)  No matching staging information was found for the patient.   Oncology History   # 2015- Biopsy of para-aortic lymph node is consistent with follicular lymphoma, clinically stage as 3A Diagnosis in July of 2015. Started on rituximab on July 31, 2013 2. 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  female pleasant patient above history of Recurrent follicular lymphoma/ also a right pleural effusion presumed from lymphoma is here for follow-up.  Patient is currently on single agent  rituximab monthly.  Patient status post multiple rounds of antibiotics for bronchitis. She was recently evaluated by pulmonary.  Her appetite is improving onMarinol. No nausea no vomitin. Noted to have decreasing output  Pleurx catheter. She is gaining weight..  Patient denies any fevers. Denies any nausea vomiting. She has mild pain in her right chest wall/Pleurx catheter area. The last few days cough gotten worse. No blood. Positive for sputum.  REVIEW OF SYSTEMS:  A complete 10 point review of system is done which is negative except mentioned above/history of present illness.   PAST MEDICAL HISTORY :  Past Medical History:  Diagnosis Date  . Anxiety   . Cataract   . Chronic chest pain    a. nonobs cath 2002.  Marland Kitchen Decreased appetite   . Depression   . DM2 (diabetes mellitus, type 2) (HCC)    insulin requiring  . Heart murmur    a. 09/2014 Echo: EF 55-60%, nl wm, mod MS, mild MR, mod dil LA, mild-mod TR.  Marland Kitchen HTN  (hypertension)   . Hypercholesterolemia   . Hypomagnesemia 06/13/2014  . Irregular cardiac rhythm   . Macular degeneration   . Murmur    hx  . Non Hodgkin's lymphoma ([place])   . Non-obstructive CAD   . Orthostatic hypotension   . Palpitations    a. 01/2011 Holter monitor showed frequent PACs, sinus bradycardia  . Tinnitus    chronic    PAST SURGICAL HISTORY :   Past Surgical History:  Procedure Laterality Date  . ANKLE SURGERY Left   . CARDIAC CATHETERIZATION  2002  . INSERTION / PLACEMENT PLEURAL CATHETER    . KNEE SURGERY Left   . PARTIAL HYSTERECTOMY    . TOOTH EXTRACTION    . TUMOR REMOVAL  2012   benign neck tumor removed    FAMILY HISTORY :   Family History  Problem Relation Age of Onset  . Heart disease Mother   . Lung cancer Brother     smoked  . Bladder Cancer Brother   . Stomach cancer Brother     SOCIAL HISTORY:   Social History  Substance Use Topics  . Smoking status: Former Smoker    Packs/day: 0.25    Years: 7.00    Types: Cigarettes    Quit date: 01/25/1988  . Smokeless tobacco: Never Used  . Alcohol use No    ALLERGIES:  is allergic to clindamycin; moxifloxacin; codeine; hydrocodone; prednisone; sulfa antibiotics; unasyn [ampicillin-sulbactam sodium]; and penicillins.  MEDICATIONS:  Current Outpatient Prescriptions  Medication Sig Dispense Refill  . albuterol (PROVENTIL HFA;VENTOLIN HFA) 108 (90 Base) MCG/ACT inhaler Inhale 1-2 puffs into the lungs every 4 (four) hours as needed for wheezing or shortness of breath. 1 Inhaler 2  . ALPRAZolam (XANAX) 0.25 MG tablet Take 1 tablet (0.25 mg total) by mouth every 6 (six) hours as needed. 30 tablet 1  . benzonatate (TESSALON) 100 MG capsule TAKE 1 CAPSULE(100 MG) BY MOUTH THREE TIMES DAILY AS NEEDED FOR COUGH 30 capsule 6  . cyclobenzaprine (FLEXERIL) 10 MG tablet Take 10 mg by mouth 2 (two) times daily as needed for muscle spasms. Reported on 07/15/2015    . docusate sodium (DOK) 100 MG capsule TAKE 1  CAPSULE(100 MG) BY MOUTH TWICE DAILY 60 capsule 3  . feeding supplement, ENSURE ENLIVE, (ENSURE ENLIVE) LIQD Take 237 mLs by mouth 3 (three) times daily between meals. 237 mL 12  . fluticasone (FLONASE) 50 MCG/ACT nasal spray Place 1 spray into both nostrils daily. (Patient taking differently: Place 2 sprays into both nostrils daily. ) 16 g 6  . lidocaine-prilocaine (EMLA) cream APPLY TOPICALLY AS NEEDED. 30 g 0  . meclizine (ANTIVERT) 12.5 MG tablet Take 12.5 mg by mouth 3 (three) times daily as needed.    . megestrol (MEGACE) 40 MG/ML suspension Take 10 mLs (400 mg total) by mouth daily. 240 mL 3  . Multiple Vitamin (MULTIVITAMIN WITH MINERALS) TABS tablet Take 1 tablet by mouth daily.    . pantoprazole (PROTONIX) 40 MG tablet Take 1 tablet (40 mg total) by mouth daily. (Patient taking differently: Take 40 mg by mouth daily as needed. FOR HEARTBURN/INDIGESTION.) 30 tablet 6  . polyethylene glycol powder (GLYCOLAX/MIRALAX) powder Take 17 g by mouth daily as needed. 3350 g 0  . Respiratory Therapy Supplies (FLUTTER) DEVI Use as directed 1 each 0  . SLOW-MAG 71.5-119 MG TBEC SR tablet TAKE 1 TABLET (64 MG TOTAL) BY MOUTH 2 (TWO) TIMES DAILY. 60 tablet 3  . traMADol (ULTRAM) 50 MG tablet Take 1 tablet by mouth every 4-6 hours as needed for pain 90 tablet 0  . Fluticasone-Salmeterol (ADVAIR) 250-50 MCG/DOSE AEPB Inhale 1 puff into the lungs 2 (two) times daily. 60 each 4  . nitroGLYCERIN (NITROSTAT) 0.4 MG SL tablet Place 0.4 mg under the tongue every 5 (five) minutes x 3 doses as needed for chest pain. Reported on 07/15/2015    . predniSONE (DELTASONE) 20 MG tablet Once a day with food x 20 days. 20 tablet 0   No current facility-administered medications for this visit.    Facility-Administered Medications Ordered in Other Visits  Medication Dose Route Frequency Provider Last Rate Last Dose  . heparin lock flush 100 unit/mL  500 Units Intravenous Once Forest Gleason, MD      . sodium chloride 0.9 %  injection 10 mL  10 mL Intravenous PRN Forest Gleason, MD   10 mL at 07/24/14 1020  . sodium chloride 0.9 % injection 10 mL  10 mL Intravenous PRN Forest Gleason, MD   10 mL at 08/12/14 0834    PHYSICAL EXAMINATION: ECOG PERFORMANCE STATUS: 2 - Symptomatic, <50% confined to bed  BP 133/76 (BP Location: Left Arm, Patient Position: Sitting)   Pulse 81   Temp (!) 96.2 F (35.7 C) (Tympanic)   Resp 20   Ht 5\' 2"  (1.575 m)   Wt 119 lb 9.6 oz (54.3 kg)   SpO2 97%   BMI 21.88 kg/m   [employer]  10/16/15 0923  Weight: 119 lb 9.6 oz (54.3 kg)    GENERAL: Mechele Claude Caucasian female patient; Alert, no distress and comfortable.  Accompanied by her daughter in a wheelchair  EYES: no pallor or icterus OROPHARYNX: no thrush or ulceration; dentures NECK: supple, no masses felt LYMPH:  no palpable lymphadenopathy in the cervical, axillary or inguinal regions LUNGS: Decreased breath sounds on the right side compared to the left  No wheeze or crackles HEART/CVS: regular rate & rhythm and no murmurs; No lower extremity edema ABDOMEN:abdomen soft, non-tender and normal bowel sounds Musculoskeletal:no cyanosis of digits and no clubbing  PSYCH: alert & oriented x 3 with fluent speech NEURO: no focal motor/sensory deficits SKIN:  no rashes or significant lesions  LABORATORY DATA:  I have reviewed the data as listed    Component Value Date/Time   NA 138 10/16/2015 0915   NA 134 (L) 05/15/2014 0855   K 3.5 10/16/2015 0915   K 3.3 (L) 05/15/2014 0855   CL 108 10/16/2015 0915   CL 101 05/15/2014 0855   CO2 23 10/16/2015 0915   CO2 27 05/15/2014 0855   GLUCOSE 165 (H) 10/16/2015 0915   GLUCOSE 213 (H) 05/15/2014 0855   BUN 13 10/16/2015 0915   BUN 9 05/15/2014 0855   CREATININE 0.82 10/16/2015 0915   CREATININE 0.75 05/15/2014 0855   CREATININE 0.82 04/22/2014   CALCIUM 8.6 (L) 10/16/2015 0915   CALCIUM 8.1 (L) 05/15/2014 0855   PROT 6.4 (L) 10/16/2015 0915   PROT 5.3 (L)  05/15/2014 0855   ALBUMIN 3.1 (L) 10/16/2015 0915   ALBUMIN 2.7 (L) 05/15/2014 0855   AST 20 10/16/2015 0915   AST 31 05/15/2014 0855   ALT 8 (L) 10/16/2015 0915   ALT 14 05/15/2014 0855   ALKPHOS 81 10/16/2015 0915   ALKPHOS 104 05/15/2014 0855   BILITOT 0.7 10/16/2015 0915   BILITOT 0.6 05/15/2014 0855   GFRNONAA 59 (L) 10/16/2015 0915   GFRNONAA >60 05/15/2014 0855   GFRAA >60 10/16/2015 0915   GFRAA >60 05/15/2014 0855    No results found for: SPEP, UPEP  Lab Results  Component Value Date   WBC 2.5 (L) 10/16/2015   NEUTROABS 0.8 (L) 10/16/2015   HGB 11.5 (L) 10/16/2015   HCT 33.7 (L) 10/16/2015   MCV 83.3 10/16/2015   PLT 203 10/16/2015      Chemistry      Component Value Date/Time   NA 138 10/16/2015 0915   NA 134 (L) 05/15/2014 0855   K 3.5 10/16/2015 0915   K 3.3 (L) 05/15/2014 0855   CL 108 10/16/2015 0915   CL 101 05/15/2014 0855   CO2 23 10/16/2015 0915   CO2 27 05/15/2014 0855   BUN 13 10/16/2015 0915   BUN 9 05/15/2014 0855   CREATININE 0.82 10/16/2015 0915   CREATININE 0.75 05/15/2014 0855   CREATININE 0.82 04/22/2014      Component Value Date/Time   CALCIUM 8.6 (L) 10/16/2015 0915   CALCIUM 8.1 (L) 05/15/2014 0855   ALKPHOS 81 10/16/2015 0915   ALKPHOS 104 05/15/2014 0855   AST 20 10/16/2015 0915   AST 31 05/15/2014 0855   ALT 8 (L) 10/16/2015 0915   ALT 14 05/15/2014 0855   BILITOT 0.7 10/16/2015 0915   BILITOT 0.6 05/15/2014 0855       RADIOGRAPHIC STUDIES: I have personally reviewed the radiological images as listed and agreed with the findings in the report. No results found.   ASSESSMENT & PLAN:  Follicular lymphoma grade II of intra-abdominal lymph nodes ([place]) # Follicular lymphoma G-2  With recurrence- and right external iliac lymph node  [May 2017 on PET scan]; however recurrent pleural effusion-possibly related to lymphoma.  Currently on single agent rituximab a monthly basis. Currently no progression noted.   # right pleural  effusion- ? Related to lymphoma- 100 ml in 1week.   # Cough-/bronchitis Tessalon pearls/ albuterol inhaler- recommend prednisione 20 mg/day x 20 days.   # Hypomagnesemia- 1.6/  Recommend  Medication IV  # Cachexia poor appetite- weight loss-  On Marinol improving   #  Follow-up with me in 4 weks/ IV magnesium/ labs/ rituxan.    No orders of the defined types were placed in this encounter.     Cammie Sickle, MD 10/16/2015 5:42 PM

## 2015-10-16 NOTE — Assessment & Plan Note (Addendum)
#   Follicular lymphoma G-2  With recurrence- and right external iliac lymph node  [May 2017 on PET scan]; however recurrent pleural effusion-possibly related to lymphoma.  Currently on single agent rituximab a monthly basis. Currently no progression noted.   # right pleural effusion- ? Related to lymphoma- 100 ml in 1week.   # Cough-/bronchitis Tessalon pearls/ albuterol inhaler- recommend prednisione 20 mg/day x 20 days.   # Hypomagnesemia- 1.6/  Recommend  Medication IV  # Cachexia poor appetite- weight loss-  On Marinol improving   #  Follow-up with me in 4 weks/ IV magnesium/ labs/ rituxan.

## 2015-10-16 NOTE — Telephone Encounter (Signed)
Informed daughter that Advair has been sent to pharmacy. Nothing further needed.

## 2015-10-19 ENCOUNTER — Telehealth: Payer: Self-pay | Admitting: *Deleted

## 2015-10-19 ENCOUNTER — Other Ambulatory Visit: Payer: Self-pay

## 2015-10-19 DIAGNOSIS — C8213 Follicular lymphoma grade II, intra-abdominal lymph nodes: Secondary | ICD-10-CM

## 2015-10-19 MED ORDER — PREDNISONE 20 MG PO TABS: ORAL_TABLET | ORAL | 0 refills | Status: DC

## 2015-10-19 NOTE — Telephone Encounter (Signed)
Per Dr Rogue Bussing, Intended for 20 mg due to her breathing being so bad, if she wants, she can try 10 mg, but does not want to go as low as 5 mg. [name] informed of this and stated she will try 10 mg as last time she was on 20 mg she was hallucinating from it

## 2015-10-19 NOTE — Telephone Encounter (Signed)
Called to question Prednisone dose ordered. SHe thought she was to have 5 mg because of the side effects she has with Prednisone, but she picked up Rx and it was for 20 mg daily. Please clarify dosing.

## 2015-10-22 ENCOUNTER — Ambulatory Visit (INDEPENDENT_AMBULATORY_CARE_PROVIDER_SITE_OTHER): Payer: Medicare Other | Admitting: Internal Medicine

## 2015-10-22 ENCOUNTER — Encounter: Payer: Self-pay | Admitting: Internal Medicine

## 2015-10-22 VITALS — BP 118/68 | HR 94 | Wt 120.0 lb

## 2015-10-22 DIAGNOSIS — J42 Unspecified chronic bronchitis: Secondary | ICD-10-CM

## 2015-10-22 MED ORDER — FLUTICASONE-SALMETEROL 250-50 MCG/DOSE IN AEPB: 1.0000 | INHALATION_SPRAY | Freq: Two times a day (BID) | RESPIRATORY_TRACT | 12 refills | Status: DC

## 2015-10-22 NOTE — Patient Instructions (Signed)
1.continue prednisone 5 mg daily 2.continue advair 3.albuterol as needed 4.continue flutter valve 5.follow up in 3 months

## 2015-10-22 NOTE — Progress Notes (Signed)
[facility] Consultation      Date: 10/22/2015,   MRN# [id number] Caroline Russo [birth year] Code Status:  Code Status History    Date Active Date Inactive Code Status Order ID Comments User Context   07/19/2015  3:49 PM 07/22/2015  3:10 PM DNR [id number]  Bettey Costa, MD ED    Questions for Most Recent Historical Code Status (Order [id number])    Question Answer Comment   In the event of cardiac or respiratory ARREST Do not call a "code blue"    In the event of cardiac or respiratory ARREST Do not perform Intubation, CPR, defibrillation or ACLS    In the event of cardiac or respiratory ARREST Use medication by any route, position, wound care, and other measures to relive pain and suffering. May use oxygen, suction and manual treatment of airway obstruction as needed for comfort.      Hosp day:@LENGTHOFSTAYDAYS @ Referring MD: @ATDPROV @     PCP:      Admission                  Current  [place] is a 82 y.o. old female seen in consultation for chronic cough at the request of Dr. Rockey Situ.     CHIEF COMPLAINT:   cough   HISTORY OF PRESENT ILLNESS   82 yo pleasant white female seen today for chronic productive cough for past several months She states she feels better with advair and oral prednisone at 5 mg daily   Patient has h/o Follicular lymphoma with RT pleural effusion s/p pleur x catheter placement-drains 200 ml every 10 days Patient undergoing chemo therapy  She is remote smoker 1.5 PPD for several years, quit 1990   No signs of infection at this time     Current Medication:  Current Outpatient Prescriptions:  .  albuterol (PROVENTIL HFA;VENTOLIN HFA) 108 (90 Base) MCG/ACT inhaler, Inhale 1-2 puffs into the lungs every 4 (four) hours as needed for wheezing or shortness of breath., Disp: 1 Inhaler, Rfl: 2 .  ALPRAZolam (XANAX) 0.25 MG tablet, Take 1 tablet (0.25 mg total) by mouth every 6 (six) hours as needed., Disp: 30 tablet, Rfl: 1 .   benzonatate (TESSALON) 100 MG capsule, TAKE 1 CAPSULE(100 MG) BY MOUTH THREE TIMES DAILY AS NEEDED FOR COUGH, Disp: 30 capsule, Rfl: 6 .  cyclobenzaprine (FLEXERIL) 10 MG tablet, Take 10 mg by mouth 2 (two) times daily as needed for muscle spasms. Reported on 07/15/2015, Disp: , Rfl:  .  docusate sodium (DOK) 100 MG capsule, TAKE 1 CAPSULE(100 MG) BY MOUTH TWICE DAILY, Disp: 60 capsule, Rfl: 3 .  feeding supplement, ENSURE ENLIVE, (ENSURE ENLIVE) LIQD, Take 237 mLs by mouth 3 (three) times daily between meals., Disp: 237 mL, Rfl: 12 .  fluticasone (FLONASE) 50 MCG/ACT nasal spray, Place 1 spray into both nostrils daily. (Patient taking differently: Place 2 sprays into both nostrils daily. ), Disp: 16 g, Rfl: 6 .  Fluticasone-Salmeterol (ADVAIR) 250-50 MCG/DOSE AEPB, Inhale 1 puff into the lungs 2 (two) times daily., Disp: 60 each, Rfl: 4 .  lidocaine-prilocaine (EMLA) cream, APPLY TOPICALLY AS NEEDED., Disp: 30 g, Rfl: 0 .  meclizine (ANTIVERT) 12.5 MG tablet, Take 12.5 mg by mouth 3 (three) times daily as needed., Disp: , Rfl:  .  megestrol (MEGACE) 40 MG/ML suspension, Take 10 mLs (400 mg total) by mouth daily., Disp: 240 mL, Rfl: 3 .  Multiple Vitamin (MULTIVITAMIN WITH MINERALS) TABS tablet, Take 1 tablet by mouth daily.,  Disp: , Rfl:  .  nitroGLYCERIN (NITROSTAT) 0.4 MG SL tablet, Place 0.4 mg under the tongue every 5 (five) minutes x 3 doses as needed for chest pain. Reported on 07/15/2015, Disp: , Rfl:  .  pantoprazole (PROTONIX) 40 MG tablet, Take 1 tablet (40 mg total) by mouth daily. (Patient taking differently: Take 40 mg by mouth daily as needed. FOR HEARTBURN/INDIGESTION.), Disp: 30 tablet, Rfl: 6 .  polyethylene glycol powder (GLYCOLAX/MIRALAX) powder, Take 17 g by mouth daily as needed., Disp: 3350 g, Rfl: 0 .  predniSONE (DELTASONE) 20 MG tablet, One half tablet once a day with food x 20 days., Disp: 20 tablet, Rfl: 0 .  Respiratory Therapy Supplies (FLUTTER) DEVI, Use as directed, Disp:  1 each, Rfl: 0 .  SLOW-MAG 71.5-119 MG TBEC SR tablet, TAKE 1 TABLET (64 MG TOTAL) BY MOUTH 2 (TWO) TIMES DAILY., Disp: 60 tablet, Rfl: 3 .  traMADol (ULTRAM) 50 MG tablet, Take 1 tablet by mouth every 4-6 hours as needed for pain, Disp: 90 tablet, Rfl: 0 No current facility-administered medications for this visit.   Facility-Administered Medications Ordered in Other Visits:  .  heparin lock flush 100 unit/mL, 500 Units, Intravenous, Once, Forest Gleason, MD .  sodium chloride 0.9 % injection 10 mL, 10 mL, Intravenous, PRN, Forest Gleason, MD, 10 mL at 07/24/14 1020 .  sodium chloride 0.9 % injection 10 mL, 10 mL, Intravenous, PRN, Forest Gleason, MD, 10 mL at 08/12/14 0834    ALLERGIES   Clindamycin; Moxifloxacin; Codeine; Hydrocodone; Prednisone; Sulfa antibiotics; Unasyn [ampicillin-sulbactam sodium]; and Penicillins     REVIEW OF SYSTEMS   Review of Systems  Constitutional: Positive for malaise/fatigue. Negative for chills, diaphoresis, fever and weight loss.  HENT: Negative for congestion and hearing loss.   Eyes: Negative for blurred vision and double vision.  Respiratory: Positive for cough and sputum production. Negative for hemoptysis, shortness of breath and wheezing.   Cardiovascular: Negative for chest pain, palpitations, orthopnea and leg swelling.  Gastrointestinal: Negative for abdominal pain, heartburn, nausea and vomiting.  Skin: Negative for rash.  Neurological: Negative for weakness and headaches.  All other systems reviewed and are negative.  BP 118/68 (BP Location: Left Arm, Cuff Size: Normal)   Pulse 94   Wt 120 lb (54.4 kg)   SpO2 97%   BMI 21.95 kg/m    PHYSICAL EXAM  Physical Exam  Constitutional: She is oriented to person, place, and time. No distress.  Eyes: No scleral icterus.  Neck: Neck supple.  Cardiovascular: Normal rate, regular rhythm and normal heart sounds.   No murmur heard. Pulmonary/Chest: Effort normal and breath sounds normal. No  stridor. No respiratory distress. She has no wheezes. She has no rales.  Musculoskeletal: Normal range of motion. She exhibits no edema.  Neurological: She is alert and oriented to person, place, and time. No cranial nerve deficit.  Skin: Skin is warm. She is not diaphoretic.  Psychiatric: She has a normal mood and affect.      ONO and 6MWT WNL    ASSESSMENT/PLAN   82 yo pleasant white female with h/o of Pneumonia now with chronic productive cough likely c/w chronic  Bronchitis in the setting of pleural  effuions with h/o lymphoma    1. Will continue  advair 2. Ventolin as needed 3. Continue prednisone 5 mg daily 4. Continue Flutter valve  Follow up 3 months     The Patient requires high complexity decision making for assessment and support, frequent evaluation and titration of  therapies, application of advanced monitoring technologies and extensive interpretation of multiple databases.   Patient/Family are satisfied with Plan of action and management. All questions answered  Corrin Parker, M.D.  [facility]  Medical Director [place] Director [facility] Cardio-Pulmonary Department

## 2015-11-04 DIAGNOSIS — H353133 Nonexudative age-related macular degeneration, bilateral, advanced atrophic without subfoveal involvement: Secondary | ICD-10-CM | POA: Diagnosis not present

## 2015-11-06 DIAGNOSIS — H6503 Acute serous otitis media, bilateral: Secondary | ICD-10-CM | POA: Diagnosis not present

## 2015-11-06 DIAGNOSIS — H902 Conductive hearing loss, unspecified: Secondary | ICD-10-CM | POA: Diagnosis not present

## 2015-11-13 ENCOUNTER — Inpatient Hospital Stay (HOSPITAL_BASED_OUTPATIENT_CLINIC_OR_DEPARTMENT_OTHER): Payer: Medicare Other | Admitting: Internal Medicine

## 2015-11-13 ENCOUNTER — Inpatient Hospital Stay: Payer: Medicare Other | Attending: Internal Medicine

## 2015-11-13 ENCOUNTER — Inpatient Hospital Stay: Payer: Medicare Other

## 2015-11-13 VITALS — BP 126/75 | HR 78 | Temp 97.4°F | Wt 121.1 lb

## 2015-11-13 DIAGNOSIS — E1165 Type 2 diabetes mellitus with hyperglycemia: Secondary | ICD-10-CM | POA: Diagnosis not present

## 2015-11-13 DIAGNOSIS — Z800 Family history of malignant neoplasm of digestive organs: Secondary | ICD-10-CM | POA: Insufficient documentation

## 2015-11-13 DIAGNOSIS — Z8052 Family history of malignant neoplasm of bladder: Secondary | ICD-10-CM | POA: Diagnosis not present

## 2015-11-13 DIAGNOSIS — F419 Anxiety disorder, unspecified: Secondary | ICD-10-CM | POA: Insufficient documentation

## 2015-11-13 DIAGNOSIS — C8590 Non-Hodgkin lymphoma, unspecified, unspecified site: Secondary | ICD-10-CM

## 2015-11-13 DIAGNOSIS — Z801 Family history of malignant neoplasm of trachea, bronchus and lung: Secondary | ICD-10-CM | POA: Diagnosis not present

## 2015-11-13 DIAGNOSIS — C8213 Follicular lymphoma grade II, intra-abdominal lymph nodes: Secondary | ICD-10-CM

## 2015-11-13 DIAGNOSIS — I10 Essential (primary) hypertension: Secondary | ICD-10-CM | POA: Diagnosis not present

## 2015-11-13 DIAGNOSIS — Z87891 Personal history of nicotine dependence: Secondary | ICD-10-CM | POA: Insufficient documentation

## 2015-11-13 DIAGNOSIS — C8599 Non-Hodgkin lymphoma, unspecified, extranodal and solid organ sites: Secondary | ICD-10-CM

## 2015-11-13 DIAGNOSIS — E7800 Pure hypercholesterolemia, unspecified: Secondary | ICD-10-CM | POA: Insufficient documentation

## 2015-11-13 DIAGNOSIS — F329 Major depressive disorder, single episode, unspecified: Secondary | ICD-10-CM

## 2015-11-13 DIAGNOSIS — I951 Orthostatic hypotension: Secondary | ICD-10-CM | POA: Diagnosis not present

## 2015-11-13 DIAGNOSIS — R05 Cough: Secondary | ICD-10-CM | POA: Diagnosis not present

## 2015-11-13 DIAGNOSIS — R630 Anorexia: Secondary | ICD-10-CM | POA: Insufficient documentation

## 2015-11-13 DIAGNOSIS — Z79899 Other long term (current) drug therapy: Secondary | ICD-10-CM

## 2015-11-13 DIAGNOSIS — J90 Pleural effusion, not elsewhere classified: Secondary | ICD-10-CM | POA: Insufficient documentation

## 2015-11-13 DIAGNOSIS — Z5112 Encounter for antineoplastic immunotherapy: Secondary | ICD-10-CM | POA: Diagnosis not present

## 2015-11-13 DIAGNOSIS — I499 Cardiac arrhythmia, unspecified: Secondary | ICD-10-CM

## 2015-11-13 DIAGNOSIS — R079 Chest pain, unspecified: Secondary | ICD-10-CM

## 2015-11-13 DIAGNOSIS — R634 Abnormal weight loss: Secondary | ICD-10-CM | POA: Insufficient documentation

## 2015-11-13 DIAGNOSIS — Z794 Long term (current) use of insulin: Secondary | ICD-10-CM

## 2015-11-13 LAB — CBC WITH DIFFERENTIAL/PLATELET
BASOS ABS: 0 10*3/uL (ref 0–0.1)
BASOS PCT: 1 %
EOS ABS: 0.2 10*3/uL (ref 0–0.7)
Eosinophils Relative: 6 %
HEMATOCRIT: 35.8 % (ref 35.0–47.0)
HEMOGLOBIN: 12 g/dL (ref 12.0–16.0)
LYMPHS PCT: 20 %
Lymphs Abs: 0.8 10*3/uL — ABNORMAL LOW (ref 1.0–3.6)
MCH: 27.8 pg (ref 26.0–34.0)
MCHC: 33.5 g/dL (ref 32.0–36.0)
MCV: 82.9 fL (ref 80.0–100.0)
MONOS PCT: 11 %
Monocytes Absolute: 0.4 10*3/uL (ref 0.2–0.9)
NEUTROS ABS: 2.5 10*3/uL (ref 1.4–6.5)
NEUTROS PCT: 62 %
Platelets: 260 10*3/uL (ref 150–440)
RBC: 4.31 MIL/uL (ref 3.80–5.20)
RDW: 16.2 % — ABNORMAL HIGH (ref 11.5–14.5)
WBC: 3.9 10*3/uL (ref 3.6–11.0)

## 2015-11-13 LAB — COMPREHENSIVE METABOLIC PANEL
ALBUMIN: 2.9 g/dL — AB (ref 3.5–5.0)
ALK PHOS: 87 U/L (ref 38–126)
ALT: 12 U/L — AB (ref 14–54)
AST: 18 U/L (ref 15–41)
Anion gap: 11 (ref 5–15)
BILIRUBIN TOTAL: 0.8 mg/dL (ref 0.3–1.2)
BUN: 14 mg/dL (ref 6–20)
CALCIUM: 8.7 mg/dL — AB (ref 8.9–10.3)
CO2: 22 mmol/L (ref 22–32)
CREATININE: 0.79 mg/dL (ref 0.44–1.00)
Chloride: 101 mmol/L (ref 101–111)
GFR calc Af Amer: >60 mL/min (ref >60)
GFR calc non Af Amer: >60 mL/min (ref >60)
GLUCOSE: 260 mg/dL — AB (ref 65–99)
Potassium: 3.9 mmol/L (ref 3.5–5.1)
SODIUM: 134 mmol/L — AB (ref 135–145)
TOTAL PROTEIN: 6.7 g/dL (ref 6.5–8.1)

## 2015-11-13 LAB — MAGNESIUM: Magnesium: 1.4 mg/dL — ABNORMAL LOW (ref 1.7–2.4)

## 2015-11-13 MED ORDER — ACETAMINOPHEN 325 MG PO TABS
650.0000 mg | ORAL_TABLET | Freq: Once | ORAL | Status: AC
  Administered 2015-11-13: 650 mg via ORAL
  Filled 2015-11-13: qty 2

## 2015-11-13 MED ORDER — DIPHENHYDRAMINE HCL 25 MG PO CAPS
25.0000 mg | ORAL_CAPSULE | Freq: Once | ORAL | Status: AC
  Administered 2015-11-13: 25 mg via ORAL
  Filled 2015-11-13: qty 1

## 2015-11-13 MED ORDER — SODIUM CHLORIDE 0.9% FLUSH
10.0000 mL | Freq: Once | INTRAVENOUS | Status: AC
  Administered 2015-11-13: 10 mL via INTRAVENOUS
  Filled 2015-11-13: qty 10

## 2015-11-13 MED ORDER — SODIUM CHLORIDE 0.9 % IV SOLN
375.0000 mg/m2 | Freq: Once | INTRAVENOUS | Status: AC
Start: 2015-11-13 — End: 2015-11-13
  Administered 2015-11-13: 600 mg via INTRAVENOUS
  Filled 2015-11-13: qty 50

## 2015-11-13 MED ORDER — SODIUM CHLORIDE 0.9 % IV SOLN
Freq: Once | INTRAVENOUS | Status: AC
  Administered 2015-11-13: 11:00:00 via INTRAVENOUS
  Filled 2015-11-13: qty 1000

## 2015-11-13 MED ORDER — SODIUM CHLORIDE 0.9 % IV SOLN: 375.0000 mg/m2 | Freq: Once | INTRAVENOUS | Status: DC

## 2015-11-13 MED ORDER — MAGNESIUM SULFATE 2 GM/50ML IV SOLN
2.0000 g | Freq: Once | INTRAVENOUS | Status: AC
  Administered 2015-11-13: 2 g via INTRAVENOUS
  Filled 2015-11-13: qty 50

## 2015-11-13 MED ORDER — HEPARIN SOD (PORK) LOCK FLUSH 100 UNIT/ML IV SOLN
500.0000 [IU] | Freq: Once | INTRAVENOUS | Status: AC
  Administered 2015-11-13: 500 [IU] via INTRAVENOUS
  Filled 2015-11-13: qty 5

## 2015-11-13 NOTE — Progress Notes (Signed)
[place] OFFICE PROGRESS NOTE  Patient Care Team: Arlis Porta., MD as PCP - General (Unknown Physician Specialty) Minna Merritts, MD as Consulting Physician (Cardiology)  No matching staging information was found for the patient.   Oncology History   # 2015- Biopsy of para-aortic lymph node is consistent with follicular lymphoma, clinically stage as 3A Diagnosis in July of 2015. Started on rituximab on July 31, 2013 2. 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  female pleasant patient above history of Recurrent follicular lymphoma/ also a right pleural effusion presumed from lymphoma is here for follow-up.  Patient is currently on single agent  rituximab monthly.  Patient noted to have increasing Pleurx catheter output in the last few weeks.  Patient notes to have increasing cough/or cough not resolving in the last few weeks. His productive. No fevers. She has been status post prednisone 10 mg for 3 weeks. TESSALON PERLES. RECENT EVALUATED BY PULMONARY.   Her appetite is improving onMarinol. No nausea no vomiting.   Patient denies any fevers. Denies any nausea vomiting. She has mild pain in her right chest wall/Pleurx catheter area. No blood. Positive for sputum.  REVIEW OF SYSTEMS:  A complete 10 point review of system is done which is negative except mentioned above/history of present illness.   PAST MEDICAL HISTORY :  Past Medical History:  Diagnosis Date  . Anxiety   . Cataract   . Chronic chest pain    a. nonobs cath 2002.  Marland Kitchen Decreased appetite   . Depression   . DM2 (diabetes mellitus, type 2) (HCC)    insulin requiring  . Heart murmur    a.  09/2014 Echo: EF 55-60%, nl wm, mod MS, mild MR, mod dil LA, mild-mod TR.  Marland Kitchen HTN (hypertension)   . Hypercholesterolemia   . Hypomagnesemia 06/13/2014  . Irregular cardiac rhythm   . Macular degeneration   . Murmur    hx  . Non Hodgkin's lymphoma ([place])   . Non-obstructive CAD   . Orthostatic hypotension   . Palpitations    a. 01/2011 Holter monitor showed frequent PACs, sinus bradycardia  . Tinnitus    chronic    PAST SURGICAL HISTORY :   Past Surgical History:  Procedure Laterality Date  . ANKLE SURGERY Left   . CARDIAC CATHETERIZATION  2002  . INSERTION / PLACEMENT PLEURAL CATHETER    . KNEE SURGERY Left   . PARTIAL HYSTERECTOMY    . TOOTH EXTRACTION    . TUMOR REMOVAL  2012   benign neck tumor removed    FAMILY HISTORY :   Family History  Problem Relation Age of Onset  . Heart disease Mother   . Lung cancer Brother     smoked  . Bladder Cancer Brother   . Stomach cancer Brother     SOCIAL HISTORY:   Social History  Substance Use Topics  . Smoking status: Former Smoker    Packs/day: 0.25    Years: 7.00    Types: Cigarettes    Quit date: 01/25/1988  . Smokeless tobacco: Never Used  . Alcohol use No  ALLERGIES:  is allergic to clindamycin; moxifloxacin; codeine; hydrocodone; prednisone; sulfa antibiotics; unasyn [ampicillin-sulbactam sodium]; and penicillins.  MEDICATIONS:  Current Outpatient Prescriptions  Medication Sig Dispense Refill  . albuterol (PROVENTIL HFA;VENTOLIN HFA) 108 (90 Base) MCG/ACT inhaler Inhale 1-2 puffs into the lungs every 4 (four) hours as needed for wheezing or shortness of breath. 1 Inhaler 2  . ALPRAZolam (XANAX) 0.25 MG tablet Take 1 tablet (0.25 mg total) by mouth every 6 (six) hours as needed. 30 tablet 1  . benzonatate (TESSALON) 100 MG capsule TAKE 1 CAPSULE(100 MG) BY MOUTH THREE TIMES DAILY AS NEEDED FOR COUGH 30 capsule 6  . cyclobenzaprine (FLEXERIL) 10 MG tablet Take 10 mg by mouth 2 (two) times daily as needed for muscle  spasms. Reported on 07/15/2015    . docusate sodium (DOK) 100 MG capsule TAKE 1 CAPSULE(100 MG) BY MOUTH TWICE DAILY 60 capsule 3  . feeding supplement, ENSURE ENLIVE, (ENSURE ENLIVE) LIQD Take 237 mLs by mouth 3 (three) times daily between meals. 237 mL 12  . fluticasone (FLONASE) 50 MCG/ACT nasal spray Place 1 spray into both nostrils daily. (Patient taking differently: Place 2 sprays into both nostrils daily. ) 16 g 6  . Fluticasone-Salmeterol (ADVAIR) 250-50 MCG/DOSE AEPB Inhale 1 puff into the lungs 2 (two) times daily. 60 each 12  . lidocaine-prilocaine (EMLA) cream APPLY TOPICALLY AS NEEDED. 30 g 0  . meclizine (ANTIVERT) 12.5 MG tablet Take 12.5 mg by mouth 3 (three) times daily as needed.    . megestrol (MEGACE) 40 MG/ML suspension Take 10 mLs (400 mg total) by mouth daily. 240 mL 3  . Multiple Vitamin (MULTIVITAMIN WITH MINERALS) TABS tablet Take 1 tablet by mouth daily.    . nitroGLYCERIN (NITROSTAT) 0.4 MG SL tablet Place 0.4 mg under the tongue every 5 (five) minutes x 3 doses as needed for chest pain. Reported on 07/15/2015    . pantoprazole (PROTONIX) 40 MG tablet Take 1 tablet (40 mg total) by mouth daily. (Patient taking differently: Take 40 mg by mouth daily as needed. FOR HEARTBURN/INDIGESTION.) 30 tablet 6  . polyethylene glycol powder (GLYCOLAX/MIRALAX) powder Take 17 g by mouth daily as needed. 3350 g 0  . predniSONE (DELTASONE) 20 MG tablet One half tablet once a day with food x 20 days. 20 tablet 0  . Respiratory Therapy Supplies (FLUTTER) DEVI Use as directed 1 each 0  . SLOW-MAG 71.5-119 MG TBEC SR tablet TAKE 1 TABLET (64 MG TOTAL) BY MOUTH 2 (TWO) TIMES DAILY. 60 tablet 3  . traMADol (ULTRAM) 50 MG tablet Take 1 tablet by mouth every 4-6 hours as needed for pain 90 tablet 0   No current facility-administered medications for this visit.    Facility-Administered Medications Ordered in Other Visits  Medication Dose Route Frequency Provider Last Rate Last Dose  . heparin  lock flush 100 unit/mL  500 Units Intravenous Once Forest Gleason, MD      . heparin lock flush 100 unit/mL  500 Units Intravenous Once Cammie Sickle, MD      . sodium chloride 0.9 % injection 10 mL  10 mL Intravenous PRN Forest Gleason, MD   10 mL at 07/24/14 1020  . sodium chloride 0.9 % injection 10 mL  10 mL Intravenous PRN Forest Gleason, MD   10 mL at 08/12/14 0834    PHYSICAL EXAMINATION: ECOG PERFORMANCE STATUS: 2 - Symptomatic, <50% confined to bed  BP 126/75 (BP Location: Left Arm, Patient Position: Sitting)   Pulse 78  Temp 97.4 F (36.3 C) (Tympanic)   Wt 121 lb 1 oz (54.9 kg)   BMI 22.14 kg/m   Filed Weights   11/13/15 0953  Weight: 121 lb 1 oz (54.9 kg)    GENERAL: Frail-appearing Caucasian female patient; Alert, no distress and comfortable.  Accompanied by her daughter in a wheelchair  EYES: no pallor or icterus OROPHARYNX: no thrush or ulceration; dentures NECK: supple, no masses felt LYMPH:  no palpable lymphadenopathy in the cervical, axillary or inguinal regions LUNGS: Decreased breath sounds on the right side compared to the left  No wheeze or crackles HEART/CVS: regular rate & rhythm and no murmurs; No lower extremity edema ABDOMEN:abdomen soft, non-tender and normal bowel sounds Musculoskeletal:no cyanosis of digits and no clubbing  PSYCH: alert & oriented x 3 with fluent speech NEURO: no focal motor/sensory deficits SKIN:  no rashes or significant lesions  LABORATORY DATA:  I have reviewed the data as listed    Component Value Date/Time   NA 134 (L) 11/13/2015 0900   NA 134 (L) 05/15/2014 0855   K 3.9 11/13/2015 0900   K 3.3 (L) 05/15/2014 0855   CL 101 11/13/2015 0900   CL 101 05/15/2014 0855   CO2 22 11/13/2015 0900   CO2 27 05/15/2014 0855   GLUCOSE 260 (H) 11/13/2015 0900   GLUCOSE 213 (H) 05/15/2014 0855   BUN 14 11/13/2015 0900   BUN 9 05/15/2014 0855   CREATININE 0.79 11/13/2015 0900   CREATININE 0.75 05/15/2014 0855   CREATININE  0.82 04/22/2014   CALCIUM 8.7 (L) 11/13/2015 0900   CALCIUM 8.1 (L) 05/15/2014 0855   PROT 6.7 11/13/2015 0900   PROT 5.3 (L) 05/15/2014 0855   ALBUMIN 2.9 (L) 11/13/2015 0900   ALBUMIN 2.7 (L) 05/15/2014 0855   AST 18 11/13/2015 0900   AST 31 05/15/2014 0855   ALT 12 (L) 11/13/2015 0900   ALT 14 05/15/2014 0855   ALKPHOS 87 11/13/2015 0900   ALKPHOS 104 05/15/2014 0855   BILITOT 0.8 11/13/2015 0900   BILITOT 0.6 05/15/2014 0855   GFRNONAA >60 11/13/2015 0900   GFRNONAA >60 05/15/2014 0855   GFRAA >60 11/13/2015 0900   GFRAA >60 05/15/2014 0855    No results found for: SPEP, UPEP  Lab Results  Component Value Date   WBC 3.9 11/13/2015   NEUTROABS 2.5 11/13/2015   HGB 12.0 11/13/2015   HCT 35.8 11/13/2015   MCV 82.9 11/13/2015   PLT 260 11/13/2015      Chemistry      Component Value Date/Time   NA 134 (L) 11/13/2015 0900   NA 134 (L) 05/15/2014 0855   K 3.9 11/13/2015 0900   K 3.3 (L) 05/15/2014 0855   CL 101 11/13/2015 0900   CL 101 05/15/2014 0855   CO2 22 11/13/2015 0900   CO2 27 05/15/2014 0855   BUN 14 11/13/2015 0900   BUN 9 05/15/2014 0855   CREATININE 0.79 11/13/2015 0900   CREATININE 0.75 05/15/2014 0855   CREATININE 0.82 04/22/2014      Component Value Date/Time   CALCIUM 8.7 (L) 11/13/2015 0900   CALCIUM 8.1 (L) 05/15/2014 0855   ALKPHOS 87 11/13/2015 0900   ALKPHOS 104 05/15/2014 0855   AST 18 11/13/2015 0900   AST 31 05/15/2014 0855   ALT 12 (L) 11/13/2015 0900   ALT 14 05/15/2014 0855   BILITOT 0.8 11/13/2015 0900   BILITOT 0.6 05/15/2014 0855       RADIOGRAPHIC STUDIES: I have personally reviewed  the radiological images as listed and agreed with the findings in the report. No results found.   ASSESSMENT & PLAN:  Follicular lymphoma grade II of intra-abdominal lymph nodes ([place]) # Follicular lymphoma G-2  With recurrence- and right external iliac lymph node  [May 2017 on PET scan]; however recurrent pleural effusion-possibly related  to lymphoma.  Currently on single agent rituximab a monthly basis. Clinically concerning for progression- given the progressive cough  # right pleural effusion- ? Related to lymphoma- 100 ml every 10 days; increasing pleural fluid output  # Cough-/bronchitis Tessalon pearls/ albuterol inhaler. Await PET scan.   # Elevated Blood sugars- 260s- recommend checking at home; and informing PCP.   # Hypomagnesemia- 1.6/  Recommend  Medication IV prn.   # Cachexia poor appetite- weight loss-  On Marinol improving   # follow up in few days after the PET scan/no labs.    Orders Placed This Encounter  Procedures  . NM PET Image Restag (PS) Skull Base To Thigh    Standing Status:   Future    Standing Expiration Date:   01/12/2017    Order Specific Question:   Reason for Exam (SYMPTOM  OR DIAGNOSIS REQUIRED)    Answer:   lymphoma    Order Specific Question:   Preferred imaging location?    Answer:   [facility]      Cammie Sickle, MD 11/13/2015 1:32 PM

## 2015-11-13 NOTE — Progress Notes (Signed)
Patient states she still has cough and is fatigued.  Also only took 5 mg of prednisone for 20 days.  Not taking it anymore.  Seems to have depressed her according to her daughter.  Patient fell last night getting up from bed.

## 2015-11-13 NOTE — Assessment & Plan Note (Addendum)
#   Follicular lymphoma G-2  With recurrence- and right external iliac lymph node  [May 2017 on PET scan]; however recurrent pleural effusion-possibly related to lymphoma.  Currently on single agent rituximab a monthly basis. Clinically concerning for progression- given the progressive cough  # right pleural effusion- ? Related to lymphoma- 100 ml every 10 days; increasing pleural fluid output  # Cough-/bronchitis Tessalon pearls/ albuterol inhaler. Await PET scan.   # Elevated Blood sugars- 260s- recommend checking at home; and informing PCP.   # Hypomagnesemia- 1.6/  Recommend  Medication IV prn.   # Cachexia poor appetite- weight loss-  On Marinol improving   # follow up in few days after the PET scan/no labs.

## 2015-11-16 ENCOUNTER — Telehealth: Payer: Self-pay | Admitting: Family Medicine

## 2015-11-16 NOTE — Telephone Encounter (Signed)
Check blood sugars about twice a day for several days.  If BSs remain high or go higher, she would need to be seen and consider starting insulin to control BS.  -jh

## 2015-11-16 NOTE — Telephone Encounter (Signed)
Pt. Daughter called states that  Pt was fasten bllod  Sugar 236 daughter was unsure what she needed to do. Call back  # [phone]

## 2015-11-16 NOTE — Telephone Encounter (Signed)
Patient's daughter advised.[place]

## 2015-11-19 ENCOUNTER — Telehealth: Payer: Self-pay | Admitting: Family Medicine

## 2015-11-19 NOTE — Telephone Encounter (Signed)
good plan-jh

## 2015-11-19 NOTE — Telephone Encounter (Signed)
Pt. Daughter   [name] have requested that you call her at [phone]

## 2015-11-19 NOTE — Telephone Encounter (Signed)
Patient has not been feeling well. CNA reports cough, back pain and uncontrolled blood sugars. Appt has been scheduled with Dr. Raliegh Ip 11/20/15.

## 2015-11-20 ENCOUNTER — Inpatient Hospital Stay: Payer: Medicare Other

## 2015-11-20 ENCOUNTER — Ambulatory Visit (INDEPENDENT_AMBULATORY_CARE_PROVIDER_SITE_OTHER): Payer: Medicare Other | Admitting: Family Medicine

## 2015-11-20 ENCOUNTER — Inpatient Hospital Stay
Admission: EM | Admit: 2015-11-20 | Discharge: 2015-11-24 | DRG: 193 | Disposition: A | Discharge status: Home Health | Payer: Medicare Other | Attending: Internal Medicine | Admitting: Internal Medicine

## 2015-11-20 ENCOUNTER — Encounter: Payer: Self-pay | Admitting: Emergency Medicine

## 2015-11-20 ENCOUNTER — Ambulatory Visit
Admission: RE | Admit: 2015-11-20 | Discharge: 2015-11-20 | Disposition: A | Discharge status: Home / Self Care | Payer: Medicare Other | Source: Ambulatory Visit | Attending: Family Medicine | Admitting: Family Medicine

## 2015-11-20 ENCOUNTER — Encounter: Payer: Self-pay | Admitting: Family Medicine

## 2015-11-20 ENCOUNTER — Ambulatory Visit: Payer: Medicare Other

## 2015-11-20 VITALS — BP 139/72 | HR 111 | Temp 98.2°F | Resp 16 | Ht 63.5 in | Wt 121.0 lb

## 2015-11-20 DIAGNOSIS — J90 Pleural effusion, not elsewhere classified: Secondary | ICD-10-CM | POA: Diagnosis not present

## 2015-11-20 DIAGNOSIS — E119 Type 2 diabetes mellitus without complications: Secondary | ICD-10-CM

## 2015-11-20 DIAGNOSIS — Z66 Do not resuscitate: Secondary | ICD-10-CM | POA: Diagnosis present

## 2015-11-20 DIAGNOSIS — E7800 Pure hypercholesterolemia, unspecified: Secondary | ICD-10-CM | POA: Diagnosis present

## 2015-11-20 DIAGNOSIS — Z87891 Personal history of nicotine dependence: Secondary | ICD-10-CM | POA: Diagnosis not present

## 2015-11-20 DIAGNOSIS — Z8249 Family history of ischemic heart disease and other diseases of the circulatory system: Secondary | ICD-10-CM

## 2015-11-20 DIAGNOSIS — R262 Difficulty in walking, not elsewhere classified: Secondary | ICD-10-CM

## 2015-11-20 DIAGNOSIS — I951 Orthostatic hypotension: Secondary | ICD-10-CM | POA: Diagnosis present

## 2015-11-20 DIAGNOSIS — Y95 Nosocomial condition: Secondary | ICD-10-CM | POA: Diagnosis present

## 2015-11-20 DIAGNOSIS — Z794 Long term (current) use of insulin: Secondary | ICD-10-CM

## 2015-11-20 DIAGNOSIS — F329 Major depressive disorder, single episode, unspecified: Secondary | ICD-10-CM | POA: Diagnosis present

## 2015-11-20 DIAGNOSIS — Z800 Family history of malignant neoplasm of digestive organs: Secondary | ICD-10-CM | POA: Diagnosis not present

## 2015-11-20 DIAGNOSIS — I5033 Acute on chronic diastolic (congestive) heart failure: Secondary | ICD-10-CM | POA: Diagnosis present

## 2015-11-20 DIAGNOSIS — I110 Hypertensive heart disease with heart failure: Secondary | ICD-10-CM | POA: Diagnosis present

## 2015-11-20 DIAGNOSIS — J189 Pneumonia, unspecified organism: Secondary | ICD-10-CM | POA: Insufficient documentation

## 2015-11-20 DIAGNOSIS — R0602 Shortness of breath: Secondary | ICD-10-CM | POA: Diagnosis not present

## 2015-11-20 DIAGNOSIS — H9190 Unspecified hearing loss, unspecified ear: Secondary | ICD-10-CM | POA: Diagnosis present

## 2015-11-20 DIAGNOSIS — H3530 Unspecified macular degeneration: Secondary | ICD-10-CM | POA: Diagnosis present

## 2015-11-20 DIAGNOSIS — E43 Unspecified severe protein-calorie malnutrition: Secondary | ICD-10-CM | POA: Diagnosis not present

## 2015-11-20 DIAGNOSIS — J209 Acute bronchitis, unspecified: Secondary | ICD-10-CM | POA: Diagnosis present

## 2015-11-20 DIAGNOSIS — I2510 Atherosclerotic heart disease of native coronary artery without angina pectoris: Secondary | ICD-10-CM | POA: Diagnosis present

## 2015-11-20 DIAGNOSIS — Z880 Allergy status to penicillin: Secondary | ICD-10-CM

## 2015-11-20 DIAGNOSIS — Z801 Family history of malignant neoplasm of trachea, bronchus and lung: Secondary | ICD-10-CM

## 2015-11-20 DIAGNOSIS — E860 Dehydration: Secondary | ICD-10-CM | POA: Diagnosis not present

## 2015-11-20 DIAGNOSIS — Z885 Allergy status to narcotic agent status: Secondary | ICD-10-CM

## 2015-11-20 DIAGNOSIS — C8190 Hodgkin lymphoma, unspecified, unspecified site: Secondary | ICD-10-CM | POA: Diagnosis not present

## 2015-11-20 DIAGNOSIS — F419 Anxiety disorder, unspecified: Secondary | ICD-10-CM | POA: Diagnosis present

## 2015-11-20 DIAGNOSIS — H9319 Tinnitus, unspecified ear: Secondary | ICD-10-CM | POA: Diagnosis present

## 2015-11-20 DIAGNOSIS — Z8052 Family history of malignant neoplasm of bladder: Secondary | ICD-10-CM

## 2015-11-20 DIAGNOSIS — G8929 Other chronic pain: Secondary | ICD-10-CM | POA: Diagnosis present

## 2015-11-20 DIAGNOSIS — C8590 Non-Hodgkin lymphoma, unspecified, unspecified site: Secondary | ICD-10-CM | POA: Diagnosis not present

## 2015-11-20 DIAGNOSIS — K219 Gastro-esophageal reflux disease without esophagitis: Secondary | ICD-10-CM | POA: Diagnosis present

## 2015-11-20 DIAGNOSIS — Z90710 Acquired absence of both cervix and uterus: Secondary | ICD-10-CM | POA: Diagnosis not present

## 2015-11-20 DIAGNOSIS — C8213 Follicular lymphoma grade II, intra-abdominal lymph nodes: Secondary | ICD-10-CM

## 2015-11-20 DIAGNOSIS — J9600 Acute respiratory failure, unspecified whether with hypoxia or hypercapnia: Secondary | ICD-10-CM | POA: Diagnosis present

## 2015-11-20 DIAGNOSIS — Z882 Allergy status to sulfonamides status: Secondary | ICD-10-CM

## 2015-11-20 DIAGNOSIS — Z7951 Long term (current) use of inhaled steroids: Secondary | ICD-10-CM

## 2015-11-20 DIAGNOSIS — Z881 Allergy status to other antibiotic agents status: Secondary | ICD-10-CM

## 2015-11-20 DIAGNOSIS — D849 Immunodeficiency, unspecified: Secondary | ICD-10-CM | POA: Diagnosis not present

## 2015-11-20 DIAGNOSIS — M6281 Muscle weakness (generalized): Secondary | ICD-10-CM

## 2015-11-20 DIAGNOSIS — R011 Cardiac murmur, unspecified: Secondary | ICD-10-CM | POA: Diagnosis present

## 2015-11-20 DIAGNOSIS — J181 Lobar pneumonia, unspecified organism: Principal | ICD-10-CM

## 2015-11-20 DIAGNOSIS — Z8701 Personal history of pneumonia (recurrent): Secondary | ICD-10-CM

## 2015-11-20 DIAGNOSIS — R079 Chest pain, unspecified: Secondary | ICD-10-CM | POA: Diagnosis present

## 2015-11-20 DIAGNOSIS — I10 Essential (primary) hypertension: Secondary | ICD-10-CM | POA: Diagnosis not present

## 2015-11-20 DIAGNOSIS — I509 Heart failure, unspecified: Secondary | ICD-10-CM | POA: Diagnosis not present

## 2015-11-20 DIAGNOSIS — Z888 Allergy status to other drugs, medicaments and biological substances status: Secondary | ICD-10-CM

## 2015-11-20 DIAGNOSIS — Z79899 Other long term (current) drug therapy: Secondary | ICD-10-CM

## 2015-11-20 DIAGNOSIS — E871 Hypo-osmolality and hyponatremia: Secondary | ICD-10-CM | POA: Diagnosis not present

## 2015-11-20 LAB — CBC WITH DIFFERENTIAL/PLATELET
BASOS PCT: 2 %
BLASTS: 0 %
Band Neutrophils: 5 %
Basophils Absolute: 0.1 10*3/uL (ref 0–0.1)
Eosinophils Absolute: 0.1 10*3/uL (ref 0–0.7)
Eosinophils Relative: 2 %
HCT: 39.1 % (ref 35.0–47.0)
HEMOGLOBIN: 12.5 g/dL (ref 12.0–16.0)
Lymphocytes Relative: 38 %
Lymphs Abs: 1.3 10*3/uL (ref 1.0–3.6)
MCH: 27 pg (ref 26.0–34.0)
MCHC: 32 g/dL (ref 32.0–36.0)
MCV: 84.3 fL (ref 80.0–100.0)
MONO ABS: 0.8 10*3/uL (ref 0.2–0.9)
Metamyelocytes Relative: 3 %
Monocytes Relative: 24 %
Myelocytes: 1 %
NEUTROS ABS: 1.2 10*3/uL — AB (ref 1.4–6.5)
NRBC: 0 /100{WBCs}
Neutrophils Relative %: 25 %
OTHER: 0 %
PROMYELOCYTES ABS: 0 %
Platelets: 355 10*3/uL (ref 150–440)
RBC: 4.64 MIL/uL (ref 3.80–5.20)
RDW: 16.3 % — ABNORMAL HIGH (ref 11.5–14.5)
WBC: 3.5 10*3/uL — AB (ref 3.6–11.0)

## 2015-11-20 LAB — URINALYSIS COMPLETE WITH MICROSCOPIC (ARMC ONLY)
BACTERIA UA: NONE SEEN
Bilirubin Urine: NEGATIVE
Glucose, UA: NEGATIVE mg/dL
HGB URINE DIPSTICK: NEGATIVE
Leukocytes, UA: NEGATIVE
Nitrite: NEGATIVE
PH: 5 (ref 5.0–8.0)
PROTEIN: 30 mg/dL — AB
RBC / HPF: NONE SEEN RBC/hpf (ref 0–5)
SQUAMOUS EPITHELIAL / LPF: NONE SEEN
Specific Gravity, Urine: 1.017 (ref 1.005–1.030)

## 2015-11-20 LAB — BASIC METABOLIC PANEL
Anion gap: 12 (ref 5–15)
BUN: 17 mg/dL (ref 6–20)
CHLORIDE: 101 mmol/L (ref 101–111)
CO2: 20 mmol/L — ABNORMAL LOW (ref 22–32)
CREATININE: 0.88 mg/dL (ref 0.44–1.00)
Calcium: 8.9 mg/dL (ref 8.9–10.3)
GFR calc Af Amer: >60 mL/min (ref >60)
GFR calc non Af Amer: 55 mL/min — ABNORMAL LOW (ref >60)
GLUCOSE: 211 mg/dL — AB (ref 65–99)
POTASSIUM: 4.5 mmol/L (ref 3.5–5.1)
Sodium: 133 mmol/L — ABNORMAL LOW (ref 135–145)

## 2015-11-20 LAB — MRSA PCR SCREENING: MRSA BY PCR: NEGATIVE

## 2015-11-20 MED ORDER — IPRATROPIUM-ALBUTEROL 0.5-2.5 (3) MG/3ML IN SOLN: 3.0000 mL | Freq: Four times a day (QID) | RESPIRATORY_TRACT | Status: DC | PRN

## 2015-11-20 MED ORDER — CEFEPIME-DEXTROSE 2 GM/50ML IV SOLR
2.0000 g | Freq: Once | INTRAVENOUS | Status: AC
  Administered 2015-11-20: 2 g via INTRAVENOUS
  Filled 2015-11-20: qty 50

## 2015-11-20 MED ORDER — TRAMADOL HCL 50 MG PO TABS: 50.0000 mg | ORAL_TABLET | Freq: Four times a day (QID) | ORAL | Status: DC | PRN

## 2015-11-20 MED ORDER — CYCLOBENZAPRINE HCL 10 MG PO TABS: 10.0000 mg | ORAL_TABLET | Freq: Two times a day (BID) | ORAL | Status: DC | PRN

## 2015-11-20 MED ORDER — FLUTICASONE PROPIONATE 50 MCG/ACT NA SUSP
2.0000 | Freq: Every day | NASAL | Status: DC
  Administered 2015-11-21 – 2015-11-24 (×4): 2 via NASAL
  Filled 2015-11-20: qty 16

## 2015-11-20 MED ORDER — ENOXAPARIN SODIUM 40 MG/0.4ML ~~LOC~~ SOLN
40.0000 mg | SUBCUTANEOUS | Status: DC
Start: 2015-11-20 — End: 2015-11-23
  Administered 2015-11-20 – 2015-11-22 (×3): 40 mg via SUBCUTANEOUS
  Filled 2015-11-20 (×3): qty 0.4

## 2015-11-20 MED ORDER — POLYETHYLENE GLYCOL 3350 17 G PO PACK: 17.0000 g | PACK | Freq: Every day | ORAL | Status: DC | PRN

## 2015-11-20 MED ORDER — ADULT MULTIVITAMIN W/MINERALS CH
1.0000 | ORAL_TABLET | Freq: Every day | ORAL | Status: DC
  Administered 2015-11-21 – 2015-11-24 (×4): 1 via ORAL
  Filled 2015-11-20 (×4): qty 1

## 2015-11-20 MED ORDER — PANTOPRAZOLE SODIUM 40 MG PO TBEC
40.0000 mg | DELAYED_RELEASE_TABLET | Freq: Every day | ORAL | Status: DC
  Administered 2015-11-22 – 2015-11-24 (×3): 40 mg via ORAL
  Filled 2015-11-20 (×4): qty 1

## 2015-11-20 MED ORDER — ACETAMINOPHEN 650 MG RE SUPP: 650.0000 mg | Freq: Four times a day (QID) | RECTAL | Status: DC | PRN

## 2015-11-20 MED ORDER — MECLIZINE HCL 12.5 MG PO TABS
12.5000 mg | ORAL_TABLET | Freq: Three times a day (TID) | ORAL | Status: DC | PRN
  Filled 2015-11-20: qty 1

## 2015-11-20 MED ORDER — PENTAFLUOROPROP-TETRAFLUOROETH EX AERO
INHALATION_SPRAY | CUTANEOUS | Status: DC | PRN
  Filled 2015-11-20: qty 30

## 2015-11-20 MED ORDER — VANCOMYCIN HCL IN DEXTROSE 1-5 GM/200ML-% IV SOLN
1000.0000 mg | Freq: Once | INTRAVENOUS | Status: AC
  Administered 2015-11-20: 1000 mg via INTRAVENOUS
  Filled 2015-11-20: qty 200

## 2015-11-20 MED ORDER — BENZONATATE 100 MG PO CAPS
100.0000 mg | ORAL_CAPSULE | Freq: Three times a day (TID) | ORAL | Status: DC
  Administered 2015-11-20 – 2015-11-24 (×12): 100 mg via ORAL
  Filled 2015-11-20 (×12): qty 1

## 2015-11-20 MED ORDER — MEGESTROL ACETATE 40 MG/ML PO SUSP
400.0000 mg | Freq: Every day | ORAL | Status: DC
  Administered 2015-11-21 – 2015-11-24 (×4): 400 mg via ORAL
  Filled 2015-11-20 (×4): qty 10

## 2015-11-20 MED ORDER — NITROGLYCERIN 0.4 MG SL SUBL: 0.4000 mg | SUBLINGUAL_TABLET | SUBLINGUAL | Status: DC | PRN

## 2015-11-20 MED ORDER — ACETAMINOPHEN 325 MG PO TABS: 650.0000 mg | ORAL_TABLET | Freq: Four times a day (QID) | ORAL | Status: DC | PRN

## 2015-11-20 MED ORDER — ONDANSETRON HCL 4 MG/2ML IJ SOLN
4.0000 mg | Freq: Four times a day (QID) | INTRAMUSCULAR | Status: DC | PRN
  Administered 2015-11-20: 4 mg via INTRAVENOUS
  Filled 2015-11-20: qty 2

## 2015-11-20 MED ORDER — DEXTROSE 5 % IV SOLN
2.0000 g | INTRAVENOUS | Status: DC
  Administered 2015-11-21 – 2015-11-24 (×4): 2 g via INTRAVENOUS
  Filled 2015-11-20 (×5): qty 2

## 2015-11-20 MED ORDER — ONDANSETRON HCL 4 MG PO TABS: 4.0000 mg | ORAL_TABLET | Freq: Four times a day (QID) | ORAL | Status: DC | PRN

## 2015-11-20 MED ORDER — SODIUM CHLORIDE 0.9% FLUSH
3.0000 mL | Freq: Two times a day (BID) | INTRAVENOUS | Status: DC
  Administered 2015-11-20 – 2015-11-24 (×8): 3 mL via INTRAVENOUS

## 2015-11-20 MED ORDER — LIDOCAINE-PRILOCAINE 2.5-2.5 % EX CREA
TOPICAL_CREAM | Freq: Once | CUTANEOUS | Status: DC
  Filled 2015-11-20: qty 5

## 2015-11-20 MED ORDER — SODIUM CHLORIDE 0.9 % IV SOLN
INTRAVENOUS | Status: DC
  Administered 2015-11-20: 19:00:00 via INTRAVENOUS

## 2015-11-20 MED ORDER — MOMETASONE FURO-FORMOTEROL FUM 200-5 MCG/ACT IN AERO
2.0000 | INHALATION_SPRAY | Freq: Two times a day (BID) | RESPIRATORY_TRACT | Status: DC
  Administered 2015-11-20 – 2015-11-24 (×8): 2 via RESPIRATORY_TRACT
  Filled 2015-11-20: qty 8.8

## 2015-11-20 MED ORDER — DOCUSATE SODIUM 100 MG PO CAPS
100.0000 mg | ORAL_CAPSULE | Freq: Two times a day (BID) | ORAL | Status: DC
  Administered 2015-11-20 – 2015-11-24 (×8): 100 mg via ORAL
  Filled 2015-11-20 (×8): qty 1

## 2015-11-20 MED ORDER — ALPRAZOLAM 0.25 MG PO TABS
0.2500 mg | ORAL_TABLET | Freq: Three times a day (TID) | ORAL | Status: DC | PRN
  Filled 2015-11-20: qty 1

## 2015-11-20 MED ORDER — VANCOMYCIN HCL IN DEXTROSE 750-5 MG/150ML-% IV SOLN
750.0000 mg | INTRAVENOUS | Status: DC
  Filled 2015-11-20: qty 150

## 2015-11-20 MED ORDER — LEVOFLOXACIN IN D5W 750 MG/150ML IV SOLN
750.0000 mg | Freq: Once | INTRAVENOUS | Status: DC
  Filled 2015-11-20: qty 150

## 2015-11-20 MED ORDER — POLYETHYLENE GLYCOL 3350 17 GM/SCOOP PO POWD
17.0000 g | Freq: Every day | ORAL | Status: DC | PRN
Start: 2015-11-20 — End: 2015-11-20
  Filled 2015-11-20: qty 255

## 2015-11-20 MED ORDER — ENSURE ENLIVE PO LIQD
237.0000 mL | Freq: Three times a day (TID) | ORAL | Status: DC
  Administered 2015-11-20 – 2015-11-23 (×7): 237 mL via ORAL

## 2015-11-20 MED ORDER — HYDROCOD POLST-CPM POLST ER 10-8 MG/5ML PO SUER
5.0000 mL | Freq: Two times a day (BID) | ORAL | Status: DC
  Administered 2015-11-20 – 2015-11-21 (×2): 5 mL via ORAL
  Filled 2015-11-20 (×3): qty 5

## 2015-11-20 NOTE — ED Notes (Signed)
Dr. Tressia Miners in room to assess patient.  Will continue to monitor.

## 2015-11-20 NOTE — Patient Instructions (Signed)
Thank you for coming in to clinic today.  1. Please go directly to the [facility] Emergency Department to receive treatment in ED and you most likely will be admitted to the hospital given Low Oxygen number 88-89% to receive IV antibiotics and further treatment. They will evaluate for mild dehydration as well.  Recommend to continue Advair in future and Flutter valve, follow-up with Pulmonology after hospitalization to discuss other treatments, since the prednisone was not successful.  Follow-up with Endocrinology as planned, I am comfortable with current blood sugar 150 to 250, given she is not on any medicine, this will improve once infection improves and more time off prednisone  Please schedule a follow-up appointment with Dr. Luan Pulling or Dr Parks Ranger within 1 week of leaving hospital  If you have any other questions or concerns, please feel free to call the clinic or send a message through [place]. You may also schedule an earlier appointment if necessary.  Nobie Putnam, DO [place]

## 2015-11-20 NOTE — Assessment & Plan Note (Signed)
Followed by Dr Rogue Bussing, last Rituxan infusion 1 week ago, following with upcoming PET Scan

## 2015-11-20 NOTE — H&P (Addendum)
[facility] NAME: Caroline Russo    MR#:  [id number]  DATE OF BIRTH:  10/13/1919  DATE OF ADMISSION:  11/20/2015  PRIMARY CARE PHYSICIAN: Dicky Doe, MD   REQUESTING/REFERRING PHYSICIAN: Dr. Harvest Dark  CHIEF COMPLAINT:   Chief Complaint  Patient presents with  . Cough  . Shortness of Breath    HISTORY OF PRESENT ILLNESS:  Caroline Russo  is a 82 y.o. female with a known history of Arthritis, diet-controlled diabetes, non-Hodgkin's lymphoma on monthly Rituxan infusions was very independent at baseline presents to hospital secondary to worsening cough and pneumonia on chest x-ray from PCPs office. Patient is hard of hearing and most of the history is obtained from her daughter who lives with her. Her respiratory symptoms started about 5 months ago. She was diagnosed with acute bronchitis and has finished a few courses of prednisone and she 2 rounds of azithromycin and 1 round of Levaquin. She has been having persistent cough and occasional difficulty breathing. Patient denies any chest pain, fevers or chills or nausea or vomiting. She has seen an outpatient pulmonologist and cardiologist for the same. Her last Rituxan infusion was last week. Since then she has noted significant fatigue, weakness and worsening cough. So they went to PCPs office today and had an x-ray done. It shows worsening bilateral infiltrates. So she was sent in for recurrent unresolving pneumonia.  PAST MEDICAL HISTORY:   Past Medical History:  Diagnosis Date  . Anxiety   . Cataract   . Chronic chest pain    a. nonobs cath 2002.  Marland Kitchen Decreased appetite   . Depression   . DM2 (diabetes mellitus, type 2) (HCC)    insulin requiring  . Heart murmur    a. 09/2014 Echo: EF 55-60%, nl wm, mod MS, mild MR, mod dil LA, mild-mod TR.  Marland Kitchen HTN (hypertension)   . Hypercholesterolemia   . Hypomagnesemia 06/13/2014  . Irregular cardiac rhythm   . Macular  degeneration   . Murmur    hx  . Non Hodgkin's lymphoma ([place])    On monthly Rituxan infusions  . Non-obstructive CAD   . Orthostatic hypotension   . Palpitations    a. 01/2011 Holter monitor showed frequent PACs, sinus bradycardia  . Tinnitus    chronic    PAST SURGICAL HISTORY:   Past Surgical History:  Procedure Laterality Date  . ANKLE SURGERY Left   . CARDIAC CATHETERIZATION  2002  . INSERTION / PLACEMENT PLEURAL CATHETER    . KNEE SURGERY Left   . PARTIAL HYSTERECTOMY    . TOOTH EXTRACTION    . TUMOR REMOVAL  2012   benign neck tumor removed    SOCIAL HISTORY:   Social History  Substance Use Topics  . Smoking status: Former Smoker    Packs/day: 0.25    Years: 7.00    Types: Cigarettes    Quit date: 01/25/1988  . Smokeless tobacco: Never Used  . Alcohol use Yes     Comment: drinks beer    FAMILY HISTORY:   Family History  Problem Relation Age of Onset  . Heart disease Mother   . Lung cancer Brother     smoked  . Bladder Cancer Brother   . Stomach cancer Brother     DRUG ALLERGIES:   Allergies  Allergen Reactions  . Clindamycin Shortness Of Breath  . Moxifloxacin Shortness Of Breath    hallucinations  . Codeine Other (See Comments)  unknown  . Hydrocodone Other (See Comments)  . Prednisone     Hallucinations  . Sulfa Antibiotics Other (See Comments)  . Unasyn [Ampicillin-Sulbactam Sodium] Other (See Comments)    unknown  . Penicillins Itching and Rash    Has patient had a PCN reaction causing immediate rash, facial/tongue/throat swelling, SOB or lightheadedness with hypotension: Yes Has patient had a PCN reaction causing severe rash involving mucus membranes or skin necrosis: No Has patient had a PCN reaction that required hospitalization No Has patient had a PCN reaction occurring within the last 10 years: No If all of the above answers are "NO", then may proceed with Cephalosporin use.      REVIEW OF SYSTEMS:   Review of Systems    Constitutional: Positive for malaise/fatigue. Negative for chills, fever and weight loss.  HENT: Positive for hearing loss. Negative for ear discharge, ear pain and nosebleeds.   Eyes: Negative for blurred vision, double vision and photophobia.  Respiratory: Positive for cough and shortness of breath. Negative for hemoptysis and wheezing.   Cardiovascular: Positive for orthopnea. Negative for chest pain, palpitations and leg swelling.  Gastrointestinal: Negative for abdominal pain, constipation, diarrhea, heartburn, melena, nausea and vomiting.  Genitourinary: Negative for dysuria, frequency and urgency.  Musculoskeletal: Negative for back pain, myalgias and neck pain.  Skin: Negative for rash.  Neurological: Negative for dizziness, tingling, sensory change, speech change, focal weakness and headaches.  Endo/Heme/Allergies: Does not bruise/bleed easily.  Psychiatric/Behavioral: Negative for depression.    MEDICATIONS AT HOME:   Prior to Admission medications   Medication Sig Start Date End Date Taking? Authorizing Provider  albuterol (PROVENTIL HFA;VENTOLIN HFA) 108 (90 Base) MCG/ACT inhaler Inhale 1-2 puffs into the lungs every 4 (four) hours as needed for wheezing or shortness of breath. 09/10/15   Minna Merritts, MD  ALPRAZolam Duanne Moron) 0.25 MG tablet Take 1 tablet (0.25 mg total) by mouth every 6 (six) hours as needed. 11/05/14   Forest Gleason, MD  benzonatate (TESSALON) 100 MG capsule TAKE 1 CAPSULE(100 MG) BY MOUTH THREE TIMES DAILY AS NEEDED FOR COUGH 08/24/15   Arlis Porta., MD  cyclobenzaprine (FLEXERIL) 10 MG tablet Take 10 mg by mouth 2 (two) times daily as needed for muscle spasms. Reported on 07/15/2015    Historical Provider, MD  docusate sodium (DOK) 100 MG capsule TAKE 1 CAPSULE(100 MG) BY MOUTH TWICE DAILY 09/24/15   Cammie Sickle, MD  feeding supplement, ENSURE ENLIVE, (ENSURE ENLIVE) LIQD Take 237 mLs by mouth 3 (three) times daily between meals. 07/22/15   Max Sane, MD  fluticasone (FLONASE) 50 MCG/ACT nasal spray Place 1 spray into both nostrils daily. Patient taking differently: Place 2 sprays into both nostrils daily.  05/07/15   Arlis Porta., MD  Fluticasone-Salmeterol (ADVAIR) 250-50 MCG/DOSE AEPB Inhale 1 puff into the lungs 2 (two) times daily. Patient not taking: Reported on 11/20/2015 10/22/15   Flora Lipps, MD  lidocaine-prilocaine (EMLA) cream APPLY TOPICALLY AS NEEDED. 08/20/15   Cammie Sickle, MD  meclizine (ANTIVERT) 12.5 MG tablet Take 12.5 mg by mouth 3 (three) times daily as needed.    Historical Provider, MD  megestrol (MEGACE) 40 MG/ML suspension Take 10 mLs (400 mg total) by mouth daily. 07/27/15   Cammie Sickle, MD  Multiple Vitamin (MULTIVITAMIN WITH MINERALS) TABS tablet Take 1 tablet by mouth daily.    Historical Provider, MD  nitroGLYCERIN (NITROSTAT) 0.4 MG SL tablet Place 0.4 mg under the tongue every 5 (five) minutes  x 3 doses as needed for chest pain. Reported on 07/15/2015    Historical Provider, MD  pantoprazole (PROTONIX) 40 MG tablet Take 1 tablet (40 mg total) by mouth daily. Patient taking differently: Take 40 mg by mouth daily as needed. FOR HEARTBURN/INDIGESTION. 06/17/15   Forest Gleason, MD  polyethylene glycol powder (GLYCOLAX/MIRALAX) powder Take 17 g by mouth daily as needed. 02/17/15   Forest Gleason, MD  predniSONE (DELTASONE) 20 MG tablet One half tablet once a day with food x 20 days. Patient not taking: Reported on 11/20/2015 10/19/15   Cammie Sickle, MD  Respiratory Therapy Supplies (FLUTTER) DEVI Use as directed 09/29/15   Flora Lipps, MD  SLOW-MAG 71.5-119 MG TBEC SR tablet TAKE 1 TABLET (64 MG TOTAL) BY MOUTH 2 (TWO) TIMES DAILY. 01/02/15   Forest Gleason, MD  traMADol (ULTRAM) 50 MG tablet Take 1 tablet by mouth every 4-6 hours as needed for pain 06/17/15   Forest Gleason, MD      VITAL SIGNS:  Blood pressure (!) 122/49, pulse 93, temperature 98.3 F (36.8 C), temperature source Oral, resp.  rate 18, height 5\' 2"  (1.575 m), weight 54.9 kg (121 lb), SpO2 90 %.  PHYSICAL EXAMINATION:   Physical Exam  GENERAL:  82 y.o.-year-old patient lying in the bed with no acute distress.  EYES: Pupils equal, round, reactive to light and accommodation. No scleral icterus. Extraocular muscles intact.  HEENT: Head atraumatic, normocephalic. Oropharynx and nasopharynx clear.  NECK:  Supple, no jugular venous distention. No thyroid enlargement, no tenderness. No cervical lymphadenopathy. LUNGS: Normal breath sounds bilaterally, no wheezing, rales,rhonchi or crepitation. No use of accessory muscles of respiration. Bibasilar rhonchi, otherwise clear breath sounds noted. Lower right chest pleurex catheter present. CARDIOVASCULAR: S1, S2 normal. No rubs, or gallops. 2/6 systolic murmur noted. ABDOMEN: Soft, nontender, nondistended. Bowel sounds present. No organomegaly or mass.  EXTREMITIES: No pedal edema, cyanosis, or clubbing.  NEUROLOGIC: Cranial nerves II through XII are intact. Muscle strength 5/5 in all extremities. Sensation intact. Gait not checked.  PSYCHIATRIC: The patient is alert and oriented x 3.  SKIN: No obvious rash, lesion, or ulcer.   LABORATORY PANEL:   CBC  Recent Labs Lab 11/20/15 1245  WBC 3.5*  HGB 12.5  HCT 39.1  PLT 355   ------------------------------------------------------------------------------------------------------------------  Chemistries   Recent Labs Lab 11/20/15 1245  NA 133*  K 4.5  CL 101  CO2 20*  GLUCOSE 211*  BUN 17  CREATININE 0.88  CALCIUM 8.9   ------------------------------------------------------------------------------------------------------------------  Cardiac Enzymes No results for input(s): TROPONINI in the last 168 hours. ------------------------------------------------------------------------------------------------------------------  RADIOLOGY:  Dg Chest 2 View  Result Date: 11/20/2015 CLINICAL DATA:  Short of  breath EXAM: CHEST  2 VIEW COMPARISON:  09/01/2015 FINDINGS: Bilateral airspace disease has significantly work and since the prior study. There is now confluent airspace disease in the lower right upper lobe. Heterogeneous opacities have increased throughout both mid and lower lung zones. The right jugular Port-A-Cath is stable. Normal heart size. Hyperaeration. No pneumothorax. IMPRESSION: Worsening bilateral airspace disease suggesting pneumonia. Followup PA and lateral chest X-ray is recommended in 3-4 weeks following trial of antibiotic therapy to ensure resolution and exclude underlying malignancy. Electronically Signed   By: Marybelle Killings M.D.   On: 11/20/2015 10:19    EKG:   Orders placed or performed during the hospital encounter of 11/20/15  . ED EKG  . ED EKG    IMPRESSION AND PLAN:   Caroline Russo  is a 82 y.o. female  with a known history of Arthritis, diet-controlled diabetes, non-Hodgkin's lymphoma on monthly Rituxan infusions was very independent at baseline presents to hospital secondary to worsening cough and pneumonia on chest x-ray from PCPs office.  #1 HCAP- unresolving pneumonia per family - tried outpatient prednisone and antibiotics - denies any aspiration - with her non hodgkins lymphoma- will get a CT chest to r/o other causes - started on vancomycin, cefepime. MRSA pcr ordered, blood cultures ordered as well - pulmonary consult - continue home inhalers, duonebs prn - cough meds, flutter valve, incentive spirometry  #2 non-Hodgkin's lymphoma-follows at the cancer center with Dr.Brahmanday On monthly Rituxan infusions - has a pleurex catheter for recurrent right pleural effusions, has a port as well  #3 GERD- protonix  #4 Anxiety- xanax prn  #5 DVT prophylaxis- lovenox  Physical Therapy consulted.   All the records are reviewed and case discussed with ED provider. Management plans discussed with the patient, family and they are in agreement.  CODE STATUS:  Full Code  TOTAL TIME TAKING CARE OF THIS PATIENT: 50 minutes.    Gladstone Lighter M.D on 11/20/2015 at 2:39 PM  Between 7am to 6pm - Pager - [phone]  After 6pm go to [email or web address] - password [email or web address] [facility]  Office  [phone]  CC: Primary care physician; Dicky Doe, MD

## 2015-11-20 NOTE — Progress Notes (Signed)
Subjective:    Patient ID: Caroline Russo, female    DOB: 04-26-1919, 82 y.o.   MRN: [id number]  Caroline Russo is a 82 y.o. female presenting on 11/20/2015 for Cough and Fatigue   HPI  History primarily provided by patient's daughter and primary caregiver, Clide Dales. Patient able to provide some history as well.  RECURRENT BILATERAL PNEUMONIA / DYSPNEA / COUGH: PMH - Follicular Lymphoma (receiving chemotherapy, Oncology Dr Rogue Bussing), Severe Protein-Calorie Malnutrition, Chronic Bronchitis (followed by Dr Mortimer Fries, Pulmonology), Diastolic CHF (with preserved EF) - Reviewed recent course over past 6 months with chronic similar problem of URI, cough and PNA. Initial virus with URI cough in 05/2015, she developed lingering cough, but it did not clear after several weeks. Hospitalized 06/2015, dx with bilateral PNA treated with azithromycin with improvement but still had lingering cough, followed-up with PCP 08/13/15 with repeat CXR confirm worsening PNA, treated with Azithromycin. Later follow-up with Cards, started on Levaquin for PNA, then referred to 09/2015 Pulmonlogy, tested oxygen levels, tolerated 6 min walk, did not meet requirement for supplemental oxygen, also used an overnight oximeter with good results. Started on Advair and Flutter valve. Additionally end of 09/2015, followed up with Oncology, started a low dose course of Prednisone 5mg  daily for about 26 days (discontinued by patient/caregiver) due to worsening side effects with fatigue and mood / behavioral disruptions and elevated blood sugar, maybe mild improvement on cough, now off for 1 week. - Today presents with persistent worsening problem, productive wet cough, fatigue, thinks symptoms may be worse following last Rituxan chemotherapy infusion 1 week ago. Has chronic R pleurex catheter (since 10/2014) for lymphoma induced pleural effusion, last drained today by daughter. - She admits concern for possible dehydration, as she has had  reduced urine output overnight, now only voiding x 3 daily, still tolerating PO but some reduced, given chronic poor appetite, drinking water, treated with megestrol with +10 lb wt gain and some good results - Note patient has many significant antibiotic drug allergies and failed several prior courses antibiotics - Denies measured fever or sweats, shaking chills, headache, chest pain or pressure, worsening shortness of breath (has some at baseline)  Social History  Substance Use Topics  . Smoking status: Former Smoker    Packs/day: 0.25    Years: 7.00    Types: Cigarettes    Quit date: 01/25/1988  . Smokeless tobacco: Never Used  . Alcohol use No    Review of Systems Per HPI unless specifically indicated above     Objective:    BP 139/72 (BP Location: Right Arm, Patient Position: Sitting, Cuff Size: Normal)   Pulse (!) 111   Temp 98.2 F (36.8 C) (Oral)   Resp 16   Ht 5' 3.5" (1.613 m)   Wt 121 lb (54.9 kg)   SpO2 (!) 88%   BMI 21.10 kg/m   Wt Readings from Last 3 Encounters:  11/20/15 121 lb (54.9 kg)  11/13/15 121 lb 1 oz (54.9 kg)  10/22/15 120 lb (54.4 kg)    Physical Exam  Constitutional: She appears well-developed and well-nourished. No distress.  Chronically ill, currently sick but non toxic, mostly comfortable and cooperative  HENT:  Head: Normocephalic and atraumatic.  Mild dry mucus membranes and tongue  Eyes: Conjunctivae are normal.  Neck: Normal range of motion. Neck supple. No thyromegaly present.  Cardiovascular: Regular rhythm, normal heart sounds and intact distal pulses.   No murmur heard. Tachycardia  Pulmonary/Chest: No respiratory distress (frequent productive cough,  but regular respiratory rate and speaks appropriately). She has no wheezes. She has rales (bilateral mid R and lower bilateral).  Mostly preserved air movement, some reduced air movement bilateral bases, R>L with pleural effusion  Abdominal: Soft. Bowel sounds are normal. She exhibits  no distension and no mass. There is no tenderness.  Musculoskeletal: She exhibits no edema or tenderness.  Generalized muscle atrophy, chronic  Distal muscle strength intact grip and ankles 5/5  Lymphadenopathy:    She has no cervical adenopathy.  Neurological: She is alert.  Skin: Skin is warm and dry. No rash noted. She is not diaphoretic.  Psychiatric: She has a normal mood and affect. Her behavior is normal.  Nursing note and vitals reviewed.    I have personally reviewed the radiology report from Chest X-ray 2 view on 11/20/15. CLINICAL DATA:  Short of breath  EXAM: CHEST  2 VIEW  COMPARISON:  09/01/2015  FINDINGS: Bilateral airspace disease has significantly work and since the prior study. There is now confluent airspace disease in the lower right upper lobe. Heterogeneous opacities have increased throughout both mid and lower lung zones. The right jugular Port-A-Cath is stable. Normal heart size. Hyperaeration. No pneumothorax.  IMPRESSION: Worsening bilateral airspace disease suggesting pneumonia. Followup PA and lateral chest X-ray is recommended in 3-4 weeks following trial of antibiotic therapy to ensure resolution and exclude underlying malignancy.   I have personally reviewed the following lab results from 11/13/15.  Results for orders placed or performed in visit on 11/13/15  CBC with Differential  Result Value Ref Range   WBC 3.9 3.6 - 11.0 K/uL   RBC 4.31 3.80 - 5.20 MIL/uL   Hemoglobin 12.0 12.0 - 16.0 g/dL   HCT 35.8 35.0 - 47.0 %   MCV 82.9 80.0 - 100.0 fL   MCH 27.8 26.0 - 34.0 pg   MCHC 33.5 32.0 - 36.0 g/dL   RDW 16.2 (H) 11.5 - 14.5 %   Platelets 260 150 - 440 K/uL   Neutrophils Relative % 62 %   Lymphocytes Relative 20 %   Monocytes Relative 11 %   Eosinophils Relative 6 %   Basophils Relative 1 %   Neutro Abs 2.5 1.4 - 6.5 K/uL   Lymphs Abs 0.8 (L) 1.0 - 3.6 K/uL   Monocytes Absolute 0.4 0.2 - 0.9 K/uL   Eosinophils Absolute 0.2 0  - 0.7 K/uL   Basophils Absolute 0.0 0 - 0.1 K/uL   Smear Review SMEAR SCANNED   Comprehensive metabolic panel  Result Value Ref Range   Sodium 134 (L) 135 - 145 mmol/L   Potassium 3.9 3.5 - 5.1 mmol/L   Chloride 101 101 - 111 mmol/L   CO2 22 22 - 32 mmol/L   Glucose, Bld 260 (H) 65 - 99 mg/dL   BUN 14 6 - 20 mg/dL   Creatinine, Ser 0.79 0.44 - 1.00 mg/dL   Calcium 8.7 (L) 8.9 - 10.3 mg/dL   Total Protein 6.7 6.5 - 8.1 g/dL   Albumin 2.9 (L) 3.5 - 5.0 g/dL   AST 18 15 - 41 U/L   ALT 12 (L) 14 - 54 U/L   Alkaline Phosphatase 87 38 - 126 U/L   Total Bilirubin 0.8 0.3 - 1.2 mg/dL   GFR calc non Af Amer >60 >60 mL/min   GFR calc Af Amer >60 >60 mL/min   Anion gap 11 5 - 15  Magnesium  Result Value Ref Range   Magnesium 1.4 (L) 1.7 - 2.4 mg/dL  Assessment & Plan:   Problem List Items Addressed This Visit    Shortness of breath    Acute on chronic, limited exertion but currently suspect related to worsening pulm infiltrates with bilateral PNA on CXR. Pulse oximetry down to 88-89%. - See plan under Bilateral pneumonia - Sent to hospital for further evaluation, possible admission      Relevant Orders   DG Chest 2 View (Completed)   Protein-calorie malnutrition, severe    Chronic stable, without weight loss, some recent poor appetite. Improved on megestrol with prior wt gain. Contributing to current generalized debilitation in setting of cancer.      Pneumonia of both lower lobes due to infectious organism - Primary    Worsening bilateral infiltrate pneumonia confirmed on CXR (compared to last 08/2015 with improvement), acute on chronic problem with prior PNA since 06/2015, concern with high risk patient 82 yr old immunosuppression in patient with lymphoma on chemotherapy. Suspect some component of chronic bronchitis / allergic etiology (followed by Pulm, see additional notes). Not recent hospitalization (last 06/2015) or IV antibiotics within past 90 days, would not consider  Healthcare associated PNA (but prior guidelines inc risk with chemotherapy). No active wheezing or COPD today. - Afebrile, tachycardic, mild hypoxia (88-89% on RA) - Prior trials over several months on Azithromycin x 2, Levaquin, she has extensive PCN, Sulfa, Clinda, Moxifloxacin allergies listed  Plan: 1. Discussion with patient and her daughter regarding potential plan, given concern with now worsening, confirmed bilateral worse PNA on x-ray, low O2 88-89%, no improvement after multiple oral antibiotics, advair, trial on prednisone, I am concerned that there are limited options that I can provide her today. Concern that she may not improve enough on oral antibiotic therapy again, especially on Friday we do not have close follow-up within 24 hours. Given low O2 and CXR decision was to proceed directly to ED, she is currently hemodynamically stable without acute respiratory distress in office, no EMS, daughter will take her directly to [facility]. She will need additional lab eval, possible mild dehydration, and suspect will need IV antibiotics. Also discussed she may benefit from discussing her care again with Palliative Care, as they have spoken to her before to establish goals of care, perhaps improved symptom control. 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 Recent sugars up to 150-250,  some improvement now <200. No hypoglycemia. Not on any diabetic medications or hypoglycemic agents. - Reassurance today, likely inc sugar with prednisone, stress and acute on chronic illness, primary concern would be hypoglycemia, but comfortable with current readings - Follow-up with Dr Gabriel Carina as planned       Other Visit Diagnoses    Mild dehydration       Reduced urine output, still voiding, tolerating PO. Likely additional losses with frequent cough and some poor nutrition.  Anticipate labs and likely IVF in ED.      No orders of the defined types were placed in this encounter.     Follow up plan: Return in about 1 week (around 11/27/2015) for 1 week pneumonia hospital follow-up.  Nobie Putnam, [facility] 11/20/2015, 12:17 PM

## 2015-11-20 NOTE — Assessment & Plan Note (Signed)
Acute on chronic, limited exertion but currently suspect related to worsening pulm infiltrates with bilateral PNA on CXR. Pulse oximetry down to 88-89%. - See plan under Bilateral pneumonia - Sent to hospital for further evaluation, possible admission

## 2015-11-20 NOTE — Assessment & Plan Note (Signed)
Stable pleural effusion, now with worsening lung opacities with bilateral PNA on CXR today - Last drained today, no complications - See plan under pneumonia

## 2015-11-20 NOTE — Progress Notes (Addendum)
Pharmacy Antibiotic Note  Caroline Russo is a 82 y.o. female admitted on 11/20/2015 with  pneumonia.  Pharmacy has been consulted for Vancomycin and Cefepime dosing. Patient received a one time dose of cefepime 2gm IV and Vancomycin 1gm IV.   Plan: Ke: 0.030    T1/2: 23   Vd: 39  Will start patient on Vancomycin 750mg  IV every 24 hours with 15 hour stack dosing. Calculated trough at Css 18. Will plan for trough prior to 4th dose. F/U on MRSA PCR.   Will start patient on cefepime 2gm IV every 24 hours based on CrCl <104ml/ml.  Height: 5\' 2"  (157.5 cm) Weight: 121 lb (54.9 kg) IBW/kg (Calculated) : 50.1  Temp (24hrs), Avg:98.3 F (36.8 C), Min:98.2 F (36.8 C), Max:98.3 F (36.8 C)   Recent Labs Lab 11/20/15 1245  WBC 3.5*  CREATININE 0.88    Estimated Creatinine Clearance: 30.9 mL/min (by C-G formula based on SCr of 0.88 mg/dL).    Allergies  Allergen Reactions  . Clindamycin Shortness Of Breath  . Moxifloxacin Shortness Of Breath    hallucinations  . Codeine Other (See Comments)    unknown  . Hydrocodone Other (See Comments)  . Prednisone     Hallucinations  . Sulfa Antibiotics Other (See Comments)  . Unasyn [Ampicillin-Sulbactam Sodium] Other (See Comments)    unknown  . Penicillins Itching and Rash    Has patient had a PCN reaction causing immediate rash, facial/tongue/throat swelling, SOB or lightheadedness with hypotension: Yes Has patient had a PCN reaction causing severe rash involving mucus membranes or skin necrosis: No Has patient had a PCN reaction that required hospitalization No Has patient had a PCN reaction occurring within the last 10 years: No If all of the above answers are "NO", then may proceed with Cephalosporin use.      Antimicrobials this admission: 10/27 Cefepime >>  10/27 Vancomycin >>   Dose adjustments this admission:  Microbiology results: 10/27 BCx:   10/27 MRSA PCR:   Thank you for allowing pharmacy to be a part of this  patient's care.  Nancy Fetter, PharmD Clinical Pharmacist 11/20/2015 4:09 PM

## 2015-11-20 NOTE — Assessment & Plan Note (Signed)
Stable, chronic problem, recent worsening control due to prednisone 5mg  daily for about 1 month, discontinued now. Recent sugars up to 150-250, some improvement now <200. No hypoglycemia. Not on any diabetic medications or hypoglycemic agents. - Reassurance today, likely inc sugar with prednisone, stress and acute on chronic illness, primary concern would be hypoglycemia, but comfortable with current readings - Follow-up with Dr Gabriel Carina as planned

## 2015-11-20 NOTE — ED Notes (Signed)
Transporting patient to 1C-104

## 2015-11-20 NOTE — Assessment & Plan Note (Signed)
Chronic stable, without weight loss, some recent poor appetite. Improved on megestrol with prior wt gain. Contributing to current generalized debilitation in setting of cancer.

## 2015-11-20 NOTE — ED Provider Notes (Signed)
[facility] Emergency Department Provider Note  Time seen: 1:11 PM  I have reviewed the triage vital signs and the nursing notes.   HISTORY  Chief Complaint Cough and Shortness of Breath    HPI Caroline Russo is a 82 y.o. female with a past medical history of depression, diabetes, hypertension, hyperlipidemia, non-Hodgkin's lymphoma currently on chemotherapy who presents the emergency department for cough and shortness of breath. According to the patient since May 2017 she has intermittently been diagnosed with pneumonia. However she states over the past 5 days she has had a constant cough and has been feeling very short of breath. She went to her doctor today who performed an x-ray which showed bilateral pneumonia and a center to the emergency department for further evaluation. Here the patient has a wet sounding cough, denies any chest pain but does state she feels short of breath. Her room air oxygen saturation varies from 88-92 percent. Patient does not wear oxygen at baseline. Last chemotherapy session was Wednesday.  Past Medical History:  Diagnosis Date  . Anxiety   . Cataract   . Chronic chest pain    a. nonobs cath 2002.  Marland Kitchen Decreased appetite   . Depression   . DM2 (diabetes mellitus, type 2) (HCC)    insulin requiring  . Heart murmur    a. 09/2014 Echo: EF 55-60%, nl wm, mod MS, mild MR, mod dil LA, mild-mod TR.  Marland Kitchen HTN (hypertension)   . Hypercholesterolemia   . Hypomagnesemia 06/13/2014  . Irregular cardiac rhythm   . Macular degeneration   . Murmur    hx  . Non Hodgkin's lymphoma ([place])   . Non-obstructive CAD   . Orthostatic hypotension   . Palpitations    a. 01/2011 Holter monitor showed frequent PACs, sinus bradycardia  . Tinnitus    chronic    Patient Active Problem List   Diagnosis Date Noted  . Pneumonia of both lower lobes due to infectious organism 11/20/2015  . Acute bronchitis 09/10/2015  . Follicular lymphoma grade II of  intra-abdominal lymph nodes ([place]) 07/24/2015  . Loss of appetite 07/24/2015  . Protein-calorie malnutrition, severe 07/21/2015  . Cough   . Fever   . Pleural effusion, bilateral   . Follicular lymphoma ([place])   . Diastolic CHF, chronic ([place])   . Hyponatremia 07/19/2015  . HTN (hypertension)   . DM2 (diabetes mellitus, type 2) ([place])   . Hypomagnesemia 06/13/2014  . Bradycardia 06/13/2014  . Pleural effusion on right 06/19/2013  . Headache 06/18/2013  . Shortness of breath 06/18/2013  . Malaise 06/18/2013  . Skin irritation 02/08/2013  . PAC (premature atrial contraction) 12/04/2012  . Macular degeneration 09/03/2012  . Orthostatic hypotension 08/20/2012  . Palpitations   . Diabetes mellitus ([place]) 09/16/2011  . Back pain 09/16/2011  . CHEST PAIN-UNSPECIFIED 01/13/2010  . Hyperlipidemia 09/16/2009  . ESSENTIAL HYPERTENSION, BENIGN 09/16/2009  . CARDIAC MURMUR 09/16/2009    Past Surgical History:  Procedure Laterality Date  . ANKLE SURGERY Left   . CARDIAC CATHETERIZATION  2002  . INSERTION / PLACEMENT PLEURAL CATHETER    . KNEE SURGERY Left   . PARTIAL HYSTERECTOMY    . TOOTH EXTRACTION    . TUMOR REMOVAL  2012   benign neck tumor removed    Prior to Admission medications   Medication Sig Start Date End Date Taking? Authorizing Provider  albuterol (PROVENTIL HFA;VENTOLIN HFA) 108 (90 Base) MCG/ACT inhaler Inhale 1-2 puffs into the lungs every 4 (four)  hours as needed for wheezing or shortness of breath. 09/10/15   Minna Merritts, MD  ALPRAZolam Duanne Moron) 0.25 MG tablet Take 1 tablet (0.25 mg total) by mouth every 6 (six) hours as needed. 11/05/14   Forest Gleason, MD  benzonatate (TESSALON) 100 MG capsule TAKE 1 CAPSULE(100 MG) BY MOUTH THREE TIMES DAILY AS NEEDED FOR COUGH 08/24/15   Arlis Porta., MD  cyclobenzaprine (FLEXERIL) 10 MG tablet Take 10 mg by mouth 2 (two) times daily as needed for muscle spasms. Reported on 07/15/2015    Historical Provider, MD  docusate  sodium (DOK) 100 MG capsule TAKE 1 CAPSULE(100 MG) BY MOUTH TWICE DAILY 09/24/15   Cammie Sickle, MD  feeding supplement, ENSURE ENLIVE, (ENSURE ENLIVE) LIQD Take 237 mLs by mouth 3 (three) times daily between meals. 07/22/15   Max Sane, MD  fluticasone (FLONASE) 50 MCG/ACT nasal spray Place 1 spray into both nostrils daily. Patient taking differently: Place 2 sprays into both nostrils daily.  05/07/15   Arlis Porta., MD  Fluticasone-Salmeterol (ADVAIR) 250-50 MCG/DOSE AEPB Inhale 1 puff into the lungs 2 (two) times daily. Patient not taking: Reported on 11/20/2015 10/22/15   Flora Lipps, MD  lidocaine-prilocaine (EMLA) cream APPLY TOPICALLY AS NEEDED. 08/20/15   Cammie Sickle, MD  meclizine (ANTIVERT) 12.5 MG tablet Take 12.5 mg by mouth 3 (three) times daily as needed.    Historical Provider, MD  megestrol (MEGACE) 40 MG/ML suspension Take 10 mLs (400 mg total) by mouth daily. 07/27/15   Cammie Sickle, MD  Multiple Vitamin (MULTIVITAMIN WITH MINERALS) TABS tablet Take 1 tablet by mouth daily.    Historical Provider, MD  nitroGLYCERIN (NITROSTAT) 0.4 MG SL tablet Place 0.4 mg under the tongue every 5 (five) minutes x 3 doses as needed for chest pain. Reported on 07/15/2015    Historical Provider, MD  pantoprazole (PROTONIX) 40 MG tablet Take 1 tablet (40 mg total) by mouth daily. Patient taking differently: Take 40 mg by mouth daily as needed. FOR HEARTBURN/INDIGESTION. 06/17/15   Forest Gleason, MD  polyethylene glycol powder (GLYCOLAX/MIRALAX) powder Take 17 g by mouth daily as needed. 02/17/15   Forest Gleason, MD  predniSONE (DELTASONE) 20 MG tablet One half tablet once a day with food x 20 days. Patient not taking: Reported on 11/20/2015 10/19/15   Cammie Sickle, MD  Respiratory Therapy Supplies (FLUTTER) DEVI Use as directed 09/29/15   Flora Lipps, MD  SLOW-MAG 71.5-119 MG TBEC SR tablet TAKE 1 TABLET (64 MG TOTAL) BY MOUTH 2 (TWO) TIMES DAILY. 01/02/15   Forest Gleason, MD   traMADol (ULTRAM) 50 MG tablet Take 1 tablet by mouth every 4-6 hours as needed for pain 06/17/15   Forest Gleason, MD    Allergies  Allergen Reactions  . Clindamycin Shortness Of Breath  . Moxifloxacin Shortness Of Breath    hallucinations  . Codeine Other (See Comments)    unknown  . Hydrocodone Other (See Comments)  . Prednisone     Hallucinations  . Sulfa Antibiotics Other (See Comments)  . Unasyn [Ampicillin-Sulbactam Sodium] Other (See Comments)    unknown  . Penicillins Itching and Rash    Has patient had a PCN reaction causing immediate rash, facial/tongue/throat swelling, SOB or lightheadedness with hypotension: Yes Has patient had a PCN reaction causing severe rash involving mucus membranes or skin necrosis: No Has patient had a PCN reaction that required hospitalization No Has patient had a PCN reaction occurring within the last 10 years:  No If all of the above answers are "NO", then may proceed with Cephalosporin use.      Family History  Problem Relation Age of Onset  . Heart disease Mother   . Lung cancer Brother     smoked  . Bladder Cancer Brother   . Stomach cancer Brother     Social History Social History  Substance Use Topics  . Smoking status: Former Smoker    Packs/day: 0.25    Years: 7.00    Types: Cigarettes    Quit date: 01/25/1988  . Smokeless tobacco: Never Used  . Alcohol use No    Review of Systems Constitutional: Negative for fever. Cardiovascular: Negative for chest pain. Respiratory: Negative for shortness of breath. Gastrointestinal: Negative for abdominal pain Neurological: Negative for headache 10-point ROS otherwise negative.  ____________________________________________   PHYSICAL EXAM:  VITAL SIGNS: ED Triage Vitals  Enc Vitals Group     BP 11/20/15 1229 (!) 122/49     Pulse Rate 11/20/15 1229 93     Resp 11/20/15 1229 18     Temp 11/20/15 1229 98.3 F (36.8 C)     Temp Source 11/20/15 1229 Oral     SpO2 11/20/15  1229 90 %     Weight 11/20/15 1230 121 lb (54.9 kg)     Height 11/20/15 1230 5\' 2"  (1.575 m)     Head Circumference --      Peak Flow --      Pain Score --      Pain Loc --      Pain Edu? --      Excl. in [place]? --    Constitutional: Alert and oriented. Well appearing and in no distress. Eyes: Normal exam ENT   Head: Normocephalic and atraumatic   Mouth/Throat: Mucous membranes are moist. Cardiovascular: Normal rate, regular rhythm. No murmur Respiratory: Patient has mild rhonchi bilaterally with wet sounding cough. No wheeze or obvious rales. Patient has a right-sided Pleurx catheter Gastrointestinal: Soft and nontender. No distention.   Musculoskeletal: Nontender with normal range of motion in all extremities. Neurologic:  Normal speech and language. No gross focal neurologic deficits are appreciated. Skin:  Skin is warm, dry and intact.  Psychiatric: Mood and affect are normal. Speech and behavior are normal.   ____________________________________________    EKG  EKG reviewed and interpreted, SL shows normal sinus rhythm at 88 bpm, narrow QRS, normal axis, largely normal intervals with nonspecific ST changes. No ST elevation.  ____________________________________________    RADIOLOGY  Bilateral pneumonia  ____________________________________________   INITIAL IMPRESSION / ASSESSMENT AND PLAN / ED COURSE  Pertinent labs & imaging results that were available during my care of the patient were reviewed by me and considered in my medical decision making (see chart for details).  The patient presents to the emergency department for cough, shortness of breath with a chest x-ray showing bilateral pneumonia. Patient currently satting 88-92 percent on room air. Has a very wet sounding cough. Satting 96% on 2 L. As the patient is currently on chemotherapy with bilateral pneumonia we will cover for healthcare associated pneumonia. Given the patient's low oxygen saturation we  will admit to the hospital for further treatment. Overall the patient appears very well, much younger appearing than her stated age.  ____________________________________________   FINAL CLINICAL IMPRESSION(S) / ED DIAGNOSES  Bilateral pneumonia Healthcare associated pneumonia    Harvest Dark, MD 11/20/15 1351

## 2015-11-20 NOTE — ED Triage Notes (Signed)
Patient arrived to ED via POV from her PCP office. Patient is currently on chemo for non-hodgkin's lymphoma, last treatment was last Friday. Patient has recurrent pneumonia since May 2017. Patient had x-ray at MD office this morning that showed Bilateral pneumonia. MD office sent patient here for treatment.

## 2015-11-20 NOTE — Assessment & Plan Note (Addendum)
Worsening bilateral infiltrate pneumonia confirmed on CXR (compared to last 08/2015 with improvement), acute on chronic problem with prior PNA since 06/2015, concern with high risk patient 82 yr old immunosuppression in patient with lymphoma on chemotherapy. Suspect some component of chronic bronchitis / allergic etiology (followed by Pulm, see additional notes). Not recent hospitalization (last 06/2015) or IV antibiotics within past 90 days, would not consider Healthcare associated PNA (but prior guidelines inc risk with chemotherapy). No active wheezing or COPD today. - Afebrile, tachycardic, mild hypoxia (88-89% on RA) - Prior trials over several months on Azithromycin x 2, Levaquin, she has extensive PCN, Sulfa, Clinda, Moxifloxacin allergies listed  Plan: 1. Discussion with patient and her daughter regarding potential plan, given concern with now worsening, confirmed bilateral worse PNA on x-ray, low O2 88-89%, no improvement after multiple oral antibiotics, advair, trial on prednisone, I am concerned that there are limited options that I can provide her today. Concern that she may not improve enough on oral antibiotic therapy again, especially on Friday we do not have close follow-up within 24 hours. Given low O2 and CXR decision was to proceed directly to ED, she is currently hemodynamically stable without acute respiratory distress in office, no EMS, daughter will take her directly to [facility]. She will need additional lab eval, possible mild dehydration, and suspect will need IV antibiotics. Also discussed she may benefit from discussing her care again with Palliative Care, as they have spoken to her before to establish goals of care, perhaps improved symptom control. - Follow-up after hospital - Recommend to contact her Oncology and Pulmonology teams as well

## 2015-11-21 DIAGNOSIS — D849 Immunodeficiency, unspecified: Secondary | ICD-10-CM

## 2015-11-21 DIAGNOSIS — J189 Pneumonia, unspecified organism: Principal | ICD-10-CM

## 2015-11-21 LAB — BASIC METABOLIC PANEL
ANION GAP: 8 (ref 5–15)
BUN: 17 mg/dL (ref 6–20)
CO2: 21 mmol/L — AB (ref 22–32)
Calcium: 8 mg/dL — ABNORMAL LOW (ref 8.9–10.3)
Chloride: 104 mmol/L (ref 101–111)
Creatinine, Ser: 0.88 mg/dL (ref 0.44–1.00)
GFR calc Af Amer: >60 mL/min (ref >60)
GFR calc non Af Amer: 55 mL/min — ABNORMAL LOW (ref >60)
GLUCOSE: 255 mg/dL — AB (ref 65–99)
POTASSIUM: 4.2 mmol/L (ref 3.5–5.1)
Sodium: 133 mmol/L — ABNORMAL LOW (ref 135–145)

## 2015-11-21 LAB — EXPECTORATED SPUTUM ASSESSMENT W GRAM STAIN, RFLX TO RESP C

## 2015-11-21 LAB — CBC
HEMATOCRIT: 30.6 % — AB (ref 35.0–47.0)
HEMOGLOBIN: 10.3 g/dL — AB (ref 12.0–16.0)
MCH: 27.7 pg (ref 26.0–34.0)
MCHC: 33.6 g/dL (ref 32.0–36.0)
MCV: 82.4 fL (ref 80.0–100.0)
Platelets: 262 10*3/uL (ref 150–440)
RBC: 3.72 MIL/uL — ABNORMAL LOW (ref 3.80–5.20)
RDW: 16.2 % — ABNORMAL HIGH (ref 11.5–14.5)
WBC: 3.8 10*3/uL (ref 3.6–11.0)

## 2015-11-21 NOTE — Evaluation (Signed)
Physical Therapy Evaluation Patient Details Name: Caroline Russo MRN: [id number] DOB: 04-05-1919 Today's Date: 11/21/2015   History of Present Illness  Caroline Russo  is a 82 y.o. female with a known history of Arthritis, diet-controlled diabetes, non-Hodgkin's lymphoma on monthly Rituxan infusions was very independent at baseline presents to hospital secondary to worsening cough and pneumonia on chest x-ray from PCPs office.  Clinical Impression  Pt presents to PT with below baseline functional mobility secondary to bilateral pneumonia.  Pt able to transfer supine<>sit with CGA/supervision and ambulate 69' with RW and supervision, limited by decreased endurance.  Pt has 24 hour support from care givers/family.  Pt would benefit from acute PT services to address objective findings.    Follow Up Recommendations Home health PT    Equipment Recommendations   (has needed)    Recommendations for Other Services       Precautions / Restrictions Precautions Precautions: Fall Precaution Comments: reports h/o 1 fall at home Restrictions Weight Bearing Restrictions: No      Mobility  Bed Mobility Overal bed mobility: Needs Assistance Bed Mobility: Sit to Supine       Sit to supine: Min guard   General bed mobility comments: Slow to elevate trunk and rotate hips, decreased balance posteriorly when feet not supported  Transfers Overall transfer level: Needs assistance Equipment used: Rolling walker (2 wheeled) Transfers: Sit to/from Stand Sit to Stand: Supervision         General transfer comment: Rises with extra effort, time; maintains balance  Ambulation/Gait Ambulation/Gait assistance: Supervision Ambulation Distance (Feet): 45 Feet Assistive device: Rolling walker (2 wheeled) Gait Pattern/deviations: Step-through pattern     General Gait Details: slow, steady gait with RW; increased time for turns; good awareness of limitations  Stairs            Wheelchair  Mobility    Modified Rankin (Stroke Patients Only)       Balance Overall balance assessment: Needs assistance Sitting-balance support: Feet supported;Bilateral upper extremity supported Sitting balance-Leahy Scale: Good   Postural control: Posterior lean Standing balance support: Bilateral upper extremity supported;During functional activity Standing balance-Leahy Scale: Good                               Pertinent Vitals/Pain Pain Assessment: No/denies pain    Home Living Family/patient expects to be discharged to:: Private residence Living Arrangements: Children;Other (Comment) (private sitters and family) Available Help at Discharge: Family Type of Home: House Home Access: Stairs to enter Entrance Stairs-Rails: None Entrance Stairs-Number of Steps: 2 Home Layout: One level Home Equipment: Cane - single point;Toilet riser;Walker - 2 wheels      Prior Function Level of Independence: Independent with assistive device(s)         Comments: Ambulates with cane, aides assist with ADL's as needed, but genrally independent; likes going out to the community for dining, shopping, etc.     Hand Dominance        Extremity/Trunk Assessment   Upper Extremity Assessment: Generalized weakness           Lower Extremity Assessment: Generalized weakness         Communication   Communication: No difficulties  Cognition Arousal/Alertness: Awake/alert Behavior During Therapy: WFL for tasks assessed/performed Overall Cognitive Status: Within Functional Limits for tasks assessed                      General Comments General  comments (skin integrity, edema, etc.): R side pleural catheter    Exercises Other Exercises Other Exercises: Seated: AP, LAQ, SLR, marching, abb/add x 20 reps each   Assessment/Plan    PT Assessment Patient needs continued PT services  PT Problem List Decreased strength;Decreased activity tolerance;Decreased  balance;Decreased mobility;Cardiopulmonary status limiting activity          PT Treatment Interventions Gait training;Stair training;Functional mobility training;Therapeutic activities;Therapeutic exercise;Balance training;Patient/family education    PT Goals (Current goals can be found in the Care Plan section)  Acute Rehab PT Goals Patient Stated Goal: To go home. PT Goal Formulation: With patient Time For Goal Achievement: 11/28/15 Potential to Achieve Goals: Good    Frequency Min 2X/week   Barriers to discharge        Co-evaluation               End of Session Equipment Utilized During Treatment: Gait belt Activity Tolerance: Patient tolerated treatment well Patient left: in chair;with call bell/phone within reach;with family/visitor present Nurse Communication: Mobility status         Time: 1330-1401 PT Time Calculation (min) (ACUTE ONLY): 31 min   Charges:   PT Evaluation $PT Eval Low Complexity: 1 Procedure PT Treatments $Therapeutic Exercise: 8-22 mins   PT G Codes:        Alexxis Mackert A Renna Kilmer, PT 11/21/2015, 2:40 PM

## 2015-11-21 NOTE — Progress Notes (Signed)
Advance care planning  Discussed with patient with daughter at bedside.  Patient lives at home and ambulates with a walker. Has been progressively getting weaker. Continues to get treatment with IV Rituxan for her not Hodgkin's lymphoma at the cancer center. She has had significant weight loss dealing with her recurrent bronchitis and pneumonia along with the lymphoma.  Patient mentions that her daughter is her healthcare power of attorney. Does not want any aggressive measures if she has cardiac arrest. Wants to be DO NOT RESUSCITATE and DO NOT INTUBATE.  Orders entered.  20 minutes spent.

## 2015-11-21 NOTE — Consult Note (Signed)
Name: Caroline Russo MRN: [id number] DOB: 10-Apr-1919    ADMISSION DATE:  11/20/2015 CONSULTATION DATE:  11/20/15  REFERRING MD :  Dr. Tressia Miners  CHIEF COMPLAINT:  Cough and Shortness of Breath  BRIEF PATIENT DESCRIPTION: This is a 82 yo female who presented to [facility] ER on 10/27 with c/o persistent cough, fatigue, weakness, and intermittent shortness of breath.  PCCM consulted for further management of recurrent unresolving Pneumonia  SIGNIFICANT EVENTS  10/27-Pt admitted to [facility] with acute respiratory failure secondary to HCAP 10/28-PCCM consulted for further management of recurrent unresolving pneumonia  STUDIES:  CT of Chest 10/27>>Multilobar airspace opacities, with associated airway thickening and lower lobe bronchiectasis, compatible with multilobar pneumonia. Coronary, aortic arch, and branch vessel atherosclerotic vascular disease. Dense mitral valve calcification. Right chest tube in place; pleural effusions have resolved. Small mediastinal lymph nodes appear stable.  HISTORY OF PRESENT ILLNESS:  This is a 82 yo female with a PMH of Palpitations, Orthostatic hypotension, Non-obstructive CAD, Non Hodgkin's lymphoma (on monthly Rituxan infusions), Recurrent right side pleural effusions secondary to Non Hodgkin's Lymphoma (Right chest Pleurex Cath in place), Heart murmur (09/2014 Echo: EF 55-60%, nl wm, moderate MS mild MR, mod dilated LA, mild-mod TR), Irregular cardiac rhythm, Hypercholesterolemia, HTN, DM II, Depression, Chronic chest pain, and Anxiety.  She presented to [facility] ER on 10/27 with worsening cough and pneumonia dx by CXR at PCP office.  Per ER notes according to pts daughter she receives monthly Rituxan infusions and at baseline she is very independent. Her last Rituxan infusion was last week since then she has been having fatigue, weakness, and worsening cough. Her respiratory symptoms started 5 months ago and she was initially diagnosed with acute bronchitis  and finished a  few courses of Prednisone, 2 rounds of Azithromycin, and 1 round of Levaquin total.  However, despite interventions she still has been having a persistent cough and occasional shortness of breath. She saw her PCP on 10/27 who instructed her to go to the ER due to bilateral pneumonia.  PCCM consulted 10/27 for further management of acute respiratory failure secondary to HCAP.  PAST MEDICAL HISTORY :   has a past medical history of Anxiety; Cataract; Chronic chest pain; Decreased appetite; Depression; DM2 (diabetes mellitus, type 2) ([place]); Heart murmur; HTN (hypertension); Hypercholesterolemia; Hypomagnesemia (06/13/2014); Irregular cardiac rhythm; Macular degeneration; Murmur; Non Hodgkin's lymphoma ([place]); Non-obstructive CAD; Orthostatic hypotension; Palpitations; and Tinnitus.  has a past surgical history that includes Cardiac catheterization (2002); Tumor removal (2012); Tooth extraction; Knee surgery (Left); Ankle surgery (Left); Partial hysterectomy; and Insertion / placement pleural catheter. Prior to Admission medications   Medication Sig Start Date End Date Taking? Authorizing Provider  albuterol (PROVENTIL HFA;VENTOLIN HFA) 108 (90 Base) MCG/ACT inhaler Inhale 1-2 puffs into the lungs every 4 (four) hours as needed for wheezing or shortness of breath. 09/10/15  Yes Minna Merritts, MD  ALPRAZolam Duanne Moron) 0.25 MG tablet Take 1 tablet (0.25 mg total) by mouth every 6 (six) hours as needed. 11/05/14  Yes Forest Gleason, MD  benzonatate (TESSALON) 100 MG capsule TAKE 1 CAPSULE(100 MG) BY MOUTH THREE TIMES DAILY AS NEEDED FOR COUGH 08/24/15  Yes Arlis Porta., MD  cyanocobalamin 500 MCG tablet Take 1,000 mcg by mouth daily.   Yes Historical Provider, MD  docusate sodium (DOK) 100 MG capsule TAKE 1 CAPSULE(100 MG) BY MOUTH TWICE DAILY 09/24/15  Yes Cammie Sickle, MD  feeding supplement, ENSURE ENLIVE, (ENSURE ENLIVE) LIQD Take 237 mLs by mouth 3 (three) times  daily between meals. 07/22/15  Yes  Vipul Manuella Ghazi, MD  fluticasone (FLONASE) 50 MCG/ACT nasal spray Place 1 spray into both nostrils daily. Patient taking differently: Place 2 sprays into both nostrils daily.  05/07/15  Yes Arlis Porta., MD  meclizine (ANTIVERT) 12.5 MG tablet Take 12.5 mg by mouth 3 (three) times daily as needed.   Yes Historical Provider, MD  megestrol (MEGACE) 40 MG/ML suspension Take 10 mLs (400 mg total) by mouth daily. 07/27/15  Yes Cammie Sickle, MD  Multiple Vitamin (MULTIVITAMIN WITH MINERALS) TABS tablet Take 1 tablet by mouth daily.   Yes Historical Provider, MD  nitroGLYCERIN (NITROSTAT) 0.4 MG SL tablet Place 0.4 mg under the tongue every 5 (five) minutes x 3 doses as needed for chest pain. Reported on 07/15/2015   Yes Historical Provider, MD  polyethylene glycol powder (GLYCOLAX/MIRALAX) powder Take 17 g by mouth daily as needed. 02/17/15  Yes Janak Choksi, MD  SLOW-MAG 71.5-119 MG TBEC SR tablet TAKE 1 TABLET (64 MG TOTAL) BY MOUTH 2 (TWO) TIMES DAILY. 01/02/15  Yes Forest Gleason, MD  traMADol (ULTRAM) 50 MG tablet Take 1 tablet by mouth every 4-6 hours as needed for pain 06/17/15  Yes Forest Gleason, MD  lidocaine-prilocaine (EMLA) cream APPLY TOPICALLY AS NEEDED. 08/20/15   Cammie Sickle, MD  Respiratory Therapy Supplies (FLUTTER) DEVI Use as directed 09/29/15   Flora Lipps, MD   Allergies  Allergen Reactions  . Clindamycin Shortness Of Breath  . Moxifloxacin Shortness Of Breath    hallucinations  . Codeine Other (See Comments)    unknown  . Hydrocodone Other (See Comments)  . Prednisone     Hallucinations  . Sulfa Antibiotics Other (See Comments)  . Unasyn [Ampicillin-Sulbactam Sodium] Other (See Comments)    unknown  . Penicillins Itching and Rash    Has patient had a PCN reaction causing immediate rash, facial/tongue/throat swelling, SOB or lightheadedness with hypotension: Yes Has patient had a PCN reaction causing severe rash involving mucus membranes or skin necrosis: No Has  patient had a PCN reaction that required hospitalization No Has patient had a PCN reaction occurring within the last 10 years: No If all of the above answers are "NO", then may proceed with Cephalosporin use.      FAMILY HISTORY:  family history includes Bladder Cancer in her brother; Heart disease in her mother; Lung cancer in her brother; Stomach cancer in her brother. SOCIAL HISTORY:  reports that she quit smoking about 27 years ago. Her smoking use included Cigarettes. She has a 1.75 pack-year smoking history. She has never used smokeless tobacco. She reports that she drinks alcohol. She reports that she does not use drugs.  REVIEW OF SYSTEMS:  Positives in BOLD Constitutional: fever, chills, weight loss, malaise/fatigue and diaphoresis.  HENT: chronic hearing loss, ear pain, nosebleeds, congestion, sore throat, neck pain, tinnitus and ear discharge.   Eyes: Negative for blurred vision, double vision, photophobia, pain, discharge and redness.  Respiratory: cough, hemoptysis, sputum production, shortness of breath, wheezing and stridor.   Cardiovascular: chest pain, palpitations, orthopnea, claudication, leg swelling and PND.  Gastrointestinal: Negative for heartburn, nausea, vomiting, abdominal pain, diarrhea, constipation, blood in stool and melena.  Genitourinary: Negative for dysuria, urgency, frequency, hematuria and flank pain.  Musculoskeletal: Negative for myalgias, back pain, joint pain and falls.  Skin: Negative for itching and rash.  Neurological: Negative for dizziness, tingling, tremors, sensory change, speech change, focal weakness, seizures, loss of consciousness, weakness and headaches.  Endo/Heme/Allergies: Negative  for environmental allergies and polydipsia. Does not bruise/bleed easily.  SUBJECTIVE:  Pt c/o persistent non productive cough states Tessalon pearls gives her some relief.  She states the Tussionex caused her to have nausea, otherwise no other  complaints.  VITAL SIGNS: Temp:  [97.6 F (36.4 C)-98.7 F (37.1 C)] 98.7 F (37.1 C) (10/28 2021) Pulse Rate:  [75-92] 75 (10/28 2021) Resp:  [20] 20 (10/28 0934) BP: (98-137)/(40-56) 121/51 (10/28 2021) SpO2:  [94 %-97 %] 96 % (10/28 2021)  PHYSICAL EXAMINATION: General:  Well developed, well nourished Caucasian female resting in bed Neuro:  Alert and oriented, follows commands HEENT:  Supple, no JVD Cardiovascular:  s1s2, no rubs or gallops, 2/6 systolic murmur Lungs: course bilateral bases, diminished throughout, even, non labored Abdomen:  +BS x4, soft, non tender, non distended Musculoskeletal:  Normal bulk and tone Skin:  Intact no rashes or lesions, right chest portacath dressing dry and intact   Recent Labs Lab 11/20/15 1245 11/21/15 0923  NA 133* 133*  K 4.5 4.2  CL 101 104  CO2 20* 21*  BUN 17 17  CREATININE 0.88 0.88  GLUCOSE 211* 255*    Recent Labs Lab 11/20/15 1245 11/21/15 0923  HGB 12.5 10.3*  HCT 39.1 30.6*  WBC 3.5* 3.8  PLT 355 262   Dg Chest 2 View  Result Date: 11/20/2015 CLINICAL DATA:  Short of breath EXAM: CHEST  2 VIEW COMPARISON:  09/01/2015 FINDINGS: Bilateral airspace disease has significantly work and since the prior study. There is now confluent airspace disease in the lower right upper lobe. Heterogeneous opacities have increased throughout both mid and lower lung zones. The right jugular Port-A-Cath is stable. Normal heart size. Hyperaeration. No pneumothorax. IMPRESSION: Worsening bilateral airspace disease suggesting pneumonia. Followup PA and lateral chest X-ray is recommended in 3-4 weeks following trial of antibiotic therapy to ensure resolution and exclude underlying malignancy. Electronically Signed   By: Marybelle Killings M.D.   On: 11/20/2015 10:19   Ct Chest Wo Contrast  Result Date: 11/20/2015 CLINICAL DATA:  Non-Hodgkin's lymphoma, last treatment 1 week ago. Recurrent pneumonia. Chest radiograph showed airspace opacities  today. EXAM: CT CHEST WITHOUT CONTRAST TECHNIQUE: Multidetector CT imaging of the chest was performed following the standard protocol without IV contrast. COMPARISON:  11/20/2015 radiograph, and prior chest CT from 07/21/2015 FINDINGS: Cardiovascular: Coronary, aortic arch, and branch vessel atherosclerotic vascular disease. Dense mitral valve calcification. Right Port-A-Cath lower SVC. Central line tip: Mediastinum/Nodes: Small scattered mediastinal lymph nodes are present. Index AP window lymph node 0.7 cm in short axis on image 51/2, formerly the same. Difficult to measure for hilar lymph nodes given similar density of the hilar vasculature. Lungs/Pleura: Prior pleural effusions have resolved and there is a right chest tube in place. Extensive airway thickening and airway plugging in both lower lobes with associated cylindrical bronchiectasis. Patchy airspace opacities in the right upper lobe, both lower lobes, posteriorly in the right middle lobe, and posteriorly in the lingula are increased from prior and compatible with multilobar pneumonia Upper Abdomen: Unremarkable Musculoskeletal: Thoracic spondylosis. Questionable subtle superior endplate compression fracture at the T3 vertebral level, unchanged. IMPRESSION: 1. Multilobar airspace opacities, with associated airway thickening and lower lobe bronchiectasis, compatible with multilobar pneumonia. 2. Coronary, aortic arch, and branch vessel atherosclerotic vascular disease. Dense mitral valve calcification. 3. Right chest tube in place; pleural effusions have resolved. 4. Small mediastinal lymph nodes appear stable. Electronically Signed   By: Van Clines M.D.   On: 11/20/2015 15:47  ASSESSMENT / PLAN: Acute respiratory failure secondary to HCAP Hx: Recurrent right pleural effusions secondary to non-Hodgkin's lymphoma (Pleurex catheter in place) Plan: Continue Vancomycin and Cefepime Continue bronchodilators Continue tessalon  Discontinue  tussionex Supplemental O2 as needed to maintain O2 sats >92% CXR in am Follow cultures  Pulmonary Hygiene   Marda Stalker, [facility] Pager [phone] (please enter 7 digits) [place] Pager [phone] (please enter 7 digits)   PCCM ATTENDING ATTESTATION:  I have evaluated patient with the APP [place], reviewed database in its entirety and discussed care plan in detail. In addition, this patient was discussed on multidisciplinary rounds.   Summary of history: She is 82 yo F with NHL diagnosed approx 2 years ago, recently completed chemotherapy with recurrent PNAs. She has been seen by Dr Mortimer Fries for same recently admitted with fatigue, cough, weakness, SOB, subjective fevers X few days (since her lost round of chemotherapy)  Important exam findings: Very pleasant NAD HEENT WNL R>L basilar crackles Reg, no M No C/C/E  DATA: R>L lower lobe airspace opacities  IMP: Recurrent PNAs - likely due to immunocompromised state (advanced age, NHL, chemotherapy). I don't think there is any reason to undertake further diagnostic testing and there is no indication for bronchoscopy as it would present some risk and would be a low yield investigation.   PLAN/REC: - Complete 10 day course of abx for HCAP (directed at any relevant positive cultures). Because she has a portacath, IV abx can potentially be completed @ home - No further diagnostic workup planned - I confirmed her DNR status with her. When she first came in, she was listed as Full Code but previously had been DNR (which is very appropriate). An out-of-hospital DNR form should be executed - Would discuss possibility of home Hospice with her and her daughter  PCCM will sign off. Please call if we can be of further assistance   Merton Border, MD PCCM service Mobile [phone] Pager [phone]

## 2015-11-21 NOTE — Progress Notes (Signed)
Pharmacy Antibiotic Note  Caroline Russo is a 82 y.o. female admitted on 11/20/2015 with  pneumonia.  Pharmacy has been consulted for Vancomycin and Cefepime dosing. Patient received a one time dose of cefepime 2gm IV and Vancomycin 1gm IV.   Plan: Ke: 0.030    T1/2: 23   Vd: 39  Will start patient on Vancomycin 750mg  IV every 24 hours with 15 hour stack dosing. Calculated trough at Css 18. Will plan for trough prior to 4th dose. F/U on MRSA PCR.   Will start patient on cefepime 2gm IV every 24 hours based on CrCl <62ml/ml.  10/28- MRSA PCR negative- Vancomycin discontinued.   Height: 5\' 2"  (157.5 cm) Weight: 121 lb 3.2 oz (55 kg) IBW/kg (Calculated) : 50.1  Temp (24hrs), Avg:98.4 F (36.9 C), Min:98 F (36.7 C), Max:99.1 F (37.3 C)   Recent Labs Lab 11/20/15 1245  WBC 3.5*  CREATININE 0.88    Estimated Creatinine Clearance: 30.9 mL/min (by C-G formula based on SCr of 0.88 mg/dL).    Allergies  Allergen Reactions  . Clindamycin Shortness Of Breath  . Moxifloxacin Shortness Of Breath    hallucinations  . Codeine Other (See Comments)    unknown  . Hydrocodone Other (See Comments)  . Prednisone     Hallucinations  . Sulfa Antibiotics Other (See Comments)  . Unasyn [Ampicillin-Sulbactam Sodium] Other (See Comments)    unknown  . Penicillins Itching and Rash    Has patient had a PCN reaction causing immediate rash, facial/tongue/throat swelling, SOB or lightheadedness with hypotension: Yes Has patient had a PCN reaction causing severe rash involving mucus membranes or skin necrosis: No Has patient had a PCN reaction that required hospitalization No Has patient had a PCN reaction occurring within the last 10 years: No If all of the above answers are "NO", then may proceed with Cephalosporin use.      Antimicrobials this admission: 10/27 Cefepime >>  10/27 Vancomycin >> 10/28  Dose adjustments this admission:  Microbiology results: 10/27 BCx:   10/27 MRSA  PCR:   Thank you for allowing pharmacy to be a part of this patient's care.  Chinita Greenland PharmD Clinical Pharmacist 11/21/2015

## 2015-11-21 NOTE — Consult Note (Signed)
Pt has been seen. Full note to follow  Merton Border, MD PCCM service Mobile [phone] Pager [phone] 11/21/2015

## 2015-11-21 NOTE — Progress Notes (Signed)
[facility] [place] NAME: Caroline Russo    MR#:  [id number]  DATE OF BIRTH:  July 02, 1919  SUBJECTIVE:  CHIEF COMPLAINT:   Chief Complaint  Patient presents with  . Cough  . Shortness of Breath   Continues to have cough. Shortness of breath on exertion. Had mild chest pain on coughing yesterday. No vomiting. Poor appetite. Has lost weight  REVIEW OF SYSTEMS:    Review of Systems  Constitutional: Positive for weight loss. Negative for chills and fever.  HENT: Negative for sore throat.   Eyes: Negative for blurred vision, double vision and pain.  Respiratory: Positive for cough, sputum production and shortness of breath. Negative for hemoptysis and wheezing.   Cardiovascular: Positive for chest pain. Negative for palpitations, orthopnea and leg swelling.  Gastrointestinal: Negative for abdominal pain, constipation, diarrhea, heartburn, nausea and vomiting.  Genitourinary: Negative for dysuria and hematuria.  Musculoskeletal: Negative for back pain and joint pain.  Skin: Negative for rash.  Neurological: Negative for sensory change, speech change, focal weakness and headaches.  Endo/Heme/Allergies: Does not bruise/bleed easily.  Psychiatric/Behavioral: Negative for depression. The patient is not nervous/anxious.     DRUG ALLERGIES:   Allergies  Allergen Reactions  . Clindamycin Shortness Of Breath  . Moxifloxacin Shortness Of Breath    hallucinations  . Codeine Other (See Comments)    unknown  . Hydrocodone Other (See Comments)  . Prednisone     Hallucinations  . Sulfa Antibiotics Other (See Comments)  . Unasyn [Ampicillin-Sulbactam Sodium] Other (See Comments)    unknown  . Penicillins Itching and Rash    Has patient had a PCN reaction causing immediate rash, facial/tongue/throat swelling, SOB or lightheadedness with hypotension: Yes Has patient had a PCN reaction causing severe rash involving mucus membranes or skin necrosis:  No Has patient had a PCN reaction that required hospitalization No Has patient had a PCN reaction occurring within the last 10 years: No If all of the above answers are "NO", then may proceed with Cephalosporin use.      VITALS:  Blood pressure (!) 98/40, pulse 82, temperature 98 F (36.7 C), temperature source Oral, resp. rate 20, height 5\' 2"  (1.575 m), weight 55 kg (121 lb 3.2 oz), SpO2 94 %.  PHYSICAL EXAMINATION:   Physical Exam  GENERAL:  82 y.o.-year-old patient lying in the bed with no acute distress.  EYES: Pupils equal, round, reactive to light and accommodation. No scleral icterus. Extraocular muscles intact.  HEENT: Head atraumatic, normocephalic. Oropharynx and nasopharynx clear.  NECK:  Supple, no jugular venous distention. No thyroid enlargement, no tenderness.  LUNGS: Decreased air entry. Right-sided rhonchi. CARDIOVASCULAR: S1, S2 normal. No murmurs, rubs, or gallops.  ABDOMEN: Soft, nontender, nondistended. Bowel sounds present. No organomegaly or mass.  EXTREMITIES: No cyanosis, clubbing or edema b/l.    NEUROLOGIC: Cranial nerves II through XII are intact. No focal Motor or sensory deficits b/l.   PSYCHIATRIC: The patient is alert and oriented x 3.  SKIN: No obvious rash, lesion, or ulcer.   LABORATORY PANEL:   CBC  Recent Labs Lab 11/21/15 0923  WBC 3.8  HGB 10.3*  HCT 30.6*  PLT 262   ------------------------------------------------------------------------------------------------------------------ Chemistries   Recent Labs Lab 11/21/15 0923  NA 133*  K 4.2  CL 104  CO2 21*  GLUCOSE 255*  BUN 17  CREATININE 0.88  CALCIUM 8.0*   ------------------------------------------------------------------------------------------------------------------  Cardiac Enzymes No results for input(s): TROPONINI in the last 168  hours. ------------------------------------------------------------------------------------------------------------------  RADIOLOGY:  Dg Chest 2 View  Result Date: 11/20/2015 CLINICAL DATA:  Short of breath EXAM: CHEST  2 VIEW COMPARISON:  09/01/2015 FINDINGS: Bilateral airspace disease has significantly work and since the prior study. There is now confluent airspace disease in the lower right upper lobe. Heterogeneous opacities have increased throughout both mid and lower lung zones. The right jugular Port-A-Cath is stable. Normal heart size. Hyperaeration. No pneumothorax. IMPRESSION: Worsening bilateral airspace disease suggesting pneumonia. Followup PA and lateral chest X-ray is recommended in 3-4 weeks following trial of antibiotic therapy to ensure resolution and exclude underlying malignancy. Electronically Signed   By: Marybelle Killings M.D.   On: 11/20/2015 10:19   Ct Chest Wo Contrast  Result Date: 11/20/2015 CLINICAL DATA:  Non-Hodgkin's lymphoma, last treatment 1 week ago. Recurrent pneumonia. Chest radiograph showed airspace opacities today. EXAM: CT CHEST WITHOUT CONTRAST TECHNIQUE: Multidetector CT imaging of the chest was performed following the standard protocol without IV contrast. COMPARISON:  11/20/2015 radiograph, and prior chest CT from 07/21/2015 FINDINGS: Cardiovascular: Coronary, aortic arch, and branch vessel atherosclerotic vascular disease. Dense mitral valve calcification. Right Port-A-Cath lower SVC. Central line tip: Mediastinum/Nodes: Small scattered mediastinal lymph nodes are present. Index AP window lymph node 0.7 cm in short axis on image 51/2, formerly the same. Difficult to measure for hilar lymph nodes given similar density of the hilar vasculature. Lungs/Pleura: Prior pleural effusions have resolved and there is a right chest tube in place. Extensive airway thickening and airway plugging in both lower lobes with associated cylindrical bronchiectasis. Patchy airspace  opacities in the right upper lobe, both lower lobes, posteriorly in the right middle lobe, and posteriorly in the lingula are increased from prior and compatible with multilobar pneumonia Upper Abdomen: Unremarkable Musculoskeletal: Thoracic spondylosis. Questionable subtle superior endplate compression fracture at the T3 vertebral level, unchanged. IMPRESSION: 1. Multilobar airspace opacities, with associated airway thickening and lower lobe bronchiectasis, compatible with multilobar pneumonia. 2. Coronary, aortic arch, and branch vessel atherosclerotic vascular disease. Dense mitral valve calcification. 3. Right chest tube in place; pleural effusions have resolved. 4. Small mediastinal lymph nodes appear stable. Electronically Signed   By: Van Clines M.D.   On: 11/20/2015 15:47     ASSESSMENT AND PLAN:   Caroline Russo  is a 82 y.o. female with a known history of Arthritis, diet-controlled diabetes, non-Hodgkin's lymphoma on monthly Rituxan infusions was very independent at baseline presents to hospital secondary to worsening cough and pneumonia on chest x-ray from PCPs office.  # Bilateral pneumonia Has had outpatient antibiotics and prednisone with no improvement. Main concern is cough and weakness. Afebrile, not needing oxygen, normal WBC. Immunosuppressed with Hodgkin's lymphoma on treatment. Discontinue vancomycin as her MRSA PCR is negative. Continue cefepime. Check sputum culture.  Discussed with Dr. Jamal Collin of pulmonary who will see the patient.  # on-Hodgkin's lymphoma-follows at the cancer center with Dr.Brahmanday On monthly Rituxan infusions - has a pleurex catheter for recurrent right pleural effusions, has a port as well  # GERD- protonix  # Anxiety- xanax prn  # DVT prophylaxis- lovenox  All the records are reviewed and case discussed with Care Management/Social Workerr. Management plans discussed with the patient, family and they are in agreement.  CODE  STATUS: DNR  DVT Prophylaxis: SCDs  TOTAL TIME TAKING CARE OF THIS PATIENT: 35 minutes.   POSSIBLE D/C IN 1-2 DAYS, DEPENDING ON CLINICAL CONDITION.  Hillary Bow R M.D on 11/21/2015 at 11:28 AM  Between 7am to 6pm - Pager - [phone]  After 6pm go to [email or web address] - password [email or web address] [place] [facility]  Office  [phone]  CC: Primary care physician; Dicky Doe, MD  Note: This dictation was prepared with Dragon dictation along with smaller phrase technology. Any transcriptional errors that result from this process are unintentional.

## 2015-11-22 ENCOUNTER — Inpatient Hospital Stay: Payer: Medicare Other

## 2015-11-22 LAB — CBC
HCT: 28.2 % — ABNORMAL LOW (ref 35.0–47.0)
HEMOGLOBIN: 9.6 g/dL — AB (ref 12.0–16.0)
MCH: 27.9 pg (ref 26.0–34.0)
MCHC: 34 g/dL (ref 32.0–36.0)
MCV: 82.1 fL (ref 80.0–100.0)
Platelets: 249 10*3/uL (ref 150–440)
RBC: 3.43 MIL/uL — AB (ref 3.80–5.20)
RDW: 16 % — ABNORMAL HIGH (ref 11.5–14.5)
WBC: 3.3 10*3/uL — ABNORMAL LOW (ref 3.6–11.0)

## 2015-11-22 MED ORDER — FUROSEMIDE 10 MG/ML IJ SOLN
40.0000 mg | Freq: Once | INTRAMUSCULAR | Status: AC
  Administered 2015-11-22: 40 mg via INTRAVENOUS
  Filled 2015-11-22: qty 4

## 2015-11-22 MED ORDER — GUAIFENESIN-DM 100-10 MG/5ML PO SYRP: 5.0000 mL | ORAL_SOLUTION | ORAL | Status: DC | PRN

## 2015-11-22 MED ORDER — FUROSEMIDE 10 MG/ML IJ SOLN
40.0000 mg | Freq: Once | INTRAMUSCULAR | Status: AC
  Administered 2015-11-22: 15:00:00 40 mg via INTRAVENOUS
  Filled 2015-11-22: qty 4

## 2015-11-22 NOTE — Progress Notes (Signed)
Pharmacy Antibiotic Note  Caroline Russo is a 82 y.o. female admitted on 11/20/2015 with  pneumonia.  Pharmacy has been consulted for Vancomycin and Cefepime dosing. Failed outpt abx. Hx: non-Hodgkin's lymphoma on monthly Rituxan infusions  Vanc d/c 10/28.  Plan: Ke: 0.030    T1/2: 23   Vd: 39  Will start patient on Vancomycin 750mg  IV every 24 hours with 15 hour stack dosing. Calculated trough at Css 18. Will plan for trough prior to 4th dose. F/U on MRSA PCR.   Will start patient on cefepime 2gm IV every 24 hours based on CrCl <61ml/ml.  10/28- MRSA PCR negative- Vancomycin discontinued.   10/29- continues on Cefepime 2 gm IV q24h.   Height: 5\' 2"  (157.5 cm) Weight: 121 lb 3.2 oz (55 kg) IBW/kg (Calculated) : 50.1  Temp (24hrs), Avg:98.5 F (36.9 C), Min:98.3 F (36.8 C), Max:98.7 F (37.1 C)   Recent Labs Lab 11/20/15 1245 11/21/15 0923 11/22/15 0519  WBC 3.5* 3.8 3.3*  CREATININE 0.88 0.88  --     Estimated Creatinine Clearance: 30.9 mL/min (by C-G formula based on SCr of 0.88 mg/dL).    Allergies  Allergen Reactions  . Clindamycin Shortness Of Breath  . Moxifloxacin Shortness Of Breath    hallucinations  . Codeine Other (See Comments)    unknown  . Hydrocodone Other (See Comments)  . Prednisone     Hallucinations  . Sulfa Antibiotics Other (See Comments)  . Unasyn [Ampicillin-Sulbactam Sodium] Other (See Comments)    unknown  . Penicillins Itching and Rash    Has patient had a PCN reaction causing immediate rash, facial/tongue/throat swelling, SOB or lightheadedness with hypotension: Yes Has patient had a PCN reaction causing severe rash involving mucus membranes or skin necrosis: No Has patient had a PCN reaction that required hospitalization No Has patient had a PCN reaction occurring within the last 10 years: No If all of the above answers are "NO", then may proceed with Cephalosporin use.      Antimicrobials this admission: 10/27 Cefepime >>   10/27 Vancomycin >> 10/28  Dose adjustments this admission:  Microbiology results: 10/27 BCx: P 10/28 Sputum cx: Rogelio Seen, budding yeast 10/27 MRSA PCR: neg  Thank you for allowing pharmacy to be a part of this patient's care.  Chinita Greenland PharmD Clinical Pharmacist 11/22/2015

## 2015-11-22 NOTE — Care Management Important Message (Signed)
Important Message  Patient Details  Name: Caroline Russo MRN: [id number] Date of Birth: [birth year]   Medicare Important Message Given:  Yes    Maddyx Wieck A, RN 11/22/2015, 2:52 PM

## 2015-11-22 NOTE — Progress Notes (Signed)
[place] at [place] NAME: Caroline Russo    MR#:  [id number]  DATE OF BIRTH:  [birth year]  SUBJECTIVE:  CHIEF COMPLAINT:   Chief Complaint  Patient presents with  . Cough  . Shortness of Breath   Feels about the same as yesterday. Continues to have cough with clear sputum. Seems to have some chronic orthopnea which is worse. No lower extremity edema. Afebrile. Not on oxygen.  Improved appetite. Patient has been walking back and forth from the bathroom with assistance. REVIEW OF SYSTEMS:    Review of Systems  Constitutional: Positive for weight loss. Negative for chills and fever.  HENT: Negative for sore throat.   Eyes: Negative for blurred vision, double vision and pain.  Respiratory: Positive for cough, sputum production and shortness of breath. Negative for hemoptysis and wheezing.   Cardiovascular: Positive for chest pain. Negative for palpitations, orthopnea and leg swelling.  Gastrointestinal: Negative for abdominal pain, constipation, diarrhea, heartburn, nausea and vomiting.  Genitourinary: Negative for dysuria and hematuria.  Musculoskeletal: Negative for back pain and joint pain.  Skin: Negative for rash.  Neurological: Negative for sensory change, speech change, focal weakness and headaches.  Endo/Heme/Allergies: Does not bruise/bleed easily.  Psychiatric/Behavioral: Negative for depression. The patient is not nervous/anxious.     DRUG ALLERGIES:   Allergies  Allergen Reactions  . Clindamycin Shortness Of Breath  . Moxifloxacin Shortness Of Breath    hallucinations  . Codeine Other (See Comments)    unknown  . Hydrocodone Other (See Comments)  . Prednisone     Hallucinations  . Sulfa Antibiotics Other (See Comments)  . Unasyn [Ampicillin-Sulbactam Sodium] Other (See Comments)    unknown  . Penicillins Itching and Rash    Has patient had a PCN reaction causing immediate rash, facial/tongue/throat swelling, SOB or  lightheadedness with hypotension: Yes Has patient had a PCN reaction causing severe rash involving mucus membranes or skin necrosis: No Has patient had a PCN reaction that required hospitalization No Has patient had a PCN reaction occurring within the last 10 years: No If all of the above answers are "NO", then may proceed with Cephalosporin use.      VITALS:  Blood pressure (!) 118/40, pulse 74, temperature 98.3 F (36.8 C), temperature source Oral, resp. rate 20, height 5\' 2"  (1.575 m), weight 55 kg (121 lb 3.2 oz), SpO2 93 %.  PHYSICAL EXAMINATION:   Physical Exam  GENERAL:  82 y.o.-year-old patient lying in the bed with no acute distress.  EYES: Pupils equal, round, reactive to light and accommodation. No scleral icterus. Extraocular muscles intact.  HEENT: Head atraumatic, normocephalic. Oropharynx and nasopharynx clear.  NECK:  Supple, no jugular venous distention. No thyroid enlargement, no tenderness.  LUNGS: Decreased air entry. Bilateral crackles. CARDIOVASCULAR: S1, S2 normal. No murmurs, rubs, or gallops.  ABDOMEN: Soft, nontender, nondistended. Bowel sounds present. No organomegaly or mass.  EXTREMITIES: No cyanosis, clubbing or edema b/l.    NEUROLOGIC: Cranial nerves II through XII are intact. No focal Motor or sensory deficits b/l.   PSYCHIATRIC: The patient is alert and oriented x 3.  SKIN: No obvious rash, lesion, or ulcer.   LABORATORY PANEL:   CBC  Recent Labs Lab 11/22/15 0519  WBC 3.3*  HGB 9.6*  HCT 28.2*  PLT 249   ------------------------------------------------------------------------------------------------------------------ Chemistries   Recent Labs Lab 11/21/15 0923  NA 133*  K 4.2  CL 104  CO2 21*  GLUCOSE 255*  BUN 17  CREATININE  0.88  CALCIUM 8.0*   ------------------------------------------------------------------------------------------------------------------  Cardiac Enzymes No results for input(s): TROPONINI in the last  168 hours. ------------------------------------------------------------------------------------------------------------------  RADIOLOGY:  Ct Chest Wo Contrast  Result Date: 11/20/2015 CLINICAL DATA:  Non-Hodgkin's lymphoma, last treatment 1 week ago. Recurrent pneumonia. Chest radiograph showed airspace opacities today. EXAM: CT CHEST WITHOUT CONTRAST TECHNIQUE: Multidetector CT imaging of the chest was performed following the standard protocol without IV contrast. COMPARISON:  11/20/2015 radiograph, and prior chest CT from 07/21/2015 FINDINGS: Cardiovascular: Coronary, aortic arch, and branch vessel atherosclerotic vascular disease. Dense mitral valve calcification. Right Port-A-Cath lower SVC. Central line tip: Mediastinum/Nodes: Small scattered mediastinal lymph nodes are present. Index AP window lymph node 0.7 cm in short axis on image 51/2, formerly the same. Difficult to measure for hilar lymph nodes given similar density of the hilar vasculature. Lungs/Pleura: Prior pleural effusions have resolved and there is a right chest tube in place. Extensive airway thickening and airway plugging in both lower lobes with associated cylindrical bronchiectasis. Patchy airspace opacities in the right upper lobe, both lower lobes, posteriorly in the right middle lobe, and posteriorly in the lingula are increased from prior and compatible with multilobar pneumonia Upper Abdomen: Unremarkable Musculoskeletal: Thoracic spondylosis. Questionable subtle superior endplate compression fracture at the T3 vertebral level, unchanged. IMPRESSION: 1. Multilobar airspace opacities, with associated airway thickening and lower lobe bronchiectasis, compatible with multilobar pneumonia. 2. Coronary, aortic arch, and branch vessel atherosclerotic vascular disease. Dense mitral valve calcification. 3. Right chest tube in place; pleural effusions have resolved. 4. Small mediastinal lymph nodes appear stable. Electronically Signed   By:  Van Clines M.D.   On: 11/20/2015 15:47   Dg Chest Port 1 View  Result Date: 11/22/2015 CLINICAL DATA:  Follow-up pneumonia.  Non-Hodgkin's lymphoma. EXAM: PORTABLE CHEST 1 VIEW COMPARISON:  11/20/2015 chest radiograph. FINDINGS: Right internal jugular MediPort terminates at the cavoatrial junction. Stable cardiomediastinal silhouette with top-normal heart size and aortic atherosclerosis. No pneumothorax. Small right pleural effusion is increased. Right basilar chest tube is stable in position. No significant left pleural effusion. Patchy consolidation in the basilar right upper lobe is increased. Patchy bibasilar opacities and bronchiectasis appears stable. IMPRESSION: 1. Increased patchy basilar right upper lobe consolidation. Stable patchy bibasilar lung opacities. Findings are most in keeping with multifocal infectious or aspiration pneumonia. 2. Bibasilar bronchiectasis. 3. Small right pleural effusion is increased. Electronically Signed   By: Ilona Sorrel M.D.   On: 11/22/2015 07:56     ASSESSMENT AND PLAN:   Caroline Russo  is a 82 y.o. female with a known history of Arthritis, diet-controlled diabetes, non-Hodgkin's lymphoma on monthly Rituxan infusions was very independent at baseline presents to hospital secondary to worsening cough and pneumonia on chest x-ray from PCPs office.  # Acute on chronic diastolic CHF She seems to have more crackles on examination today. We'll give 1 dose of Lasix. Daily Weight along with input and output.  # Bilateral pneumonia Has had outpatient antibiotics and prednisone with no improvement. Main concern is cough and weakness. Afebrile, not needing oxygen, normal WBC. Immunosuppressed with Hodgkin's lymphoma on treatment. Discontinued vancomycin as her MRSA PCR is negative. Continue cefepime. Sputum cx obtained and cx pending  # on-Hodgkin's lymphoma-follows at the cancer center with Dr.Brahmanday On monthly Rituxan infusions - has a pleurex  catheter for recurrent right pleural effusions, has a port as well  # GERD- protonix  # Anxiety- xanax prn  # DVT prophylaxis- lovenox  All the records are reviewed and case discussed with  Care Management/Social Workerr. Management plans discussed with the patient, family and they are in agreement.  CODE STATUS: DNR  DVT Prophylaxis: SCDs  TOTAL TIME TAKING CARE OF THIS PATIENT: 35 minutes.   POSSIBLE D/C IN 1-2 DAYS, DEPENDING ON CLINICAL CONDITION.  Hillary Bow R M.D on 11/22/2015 at 11:37 AM  Between 7am to 6pm - Pager - [phone]  After 6pm go to [email or web address] - password [email or web address] [facility]  Office  [phone]  CC: Primary care physician; Dicky Doe, MD  Note: This dictation was prepared with Dragon dictation along with smaller phrase technology. Any transcriptional errors that result from this process are unintentional.

## 2015-11-23 LAB — BASIC METABOLIC PANEL
ANION GAP: 9 (ref 5–15)
BUN: 20 mg/dL (ref 6–20)
CALCIUM: 8.7 mg/dL — AB (ref 8.9–10.3)
CO2: 26 mmol/L (ref 22–32)
Chloride: 101 mmol/L (ref 101–111)
Creatinine, Ser: 0.96 mg/dL (ref 0.44–1.00)
GFR, EST AFRICAN AMERICAN: 57 mL/min — AB (ref >60)
GFR, EST NON AFRICAN AMERICAN: 49 mL/min — AB (ref >60)
Glucose, Bld: 205 mg/dL — ABNORMAL HIGH (ref 65–99)
Potassium: 3.6 mmol/L (ref 3.5–5.1)
SODIUM: 136 mmol/L (ref 135–145)

## 2015-11-23 LAB — MAGNESIUM: Magnesium: 1.3 mg/dL — ABNORMAL LOW (ref 1.7–2.4)

## 2015-11-23 MED ORDER — FUROSEMIDE 40 MG PO TABS
40.0000 mg | ORAL_TABLET | Freq: Once | ORAL | Status: AC
  Administered 2015-11-23: 09:00:00 40 mg via ORAL
  Filled 2015-11-23: qty 1

## 2015-11-23 MED ORDER — POTASSIUM CHLORIDE CRYS ER 20 MEQ PO TBCR
40.0000 meq | EXTENDED_RELEASE_TABLET | Freq: Once | ORAL | Status: AC
Start: 2015-11-23 — End: 2015-11-23
  Administered 2015-11-23: 40 meq via ORAL
  Filled 2015-11-23: qty 2

## 2015-11-23 MED ORDER — ENOXAPARIN SODIUM 30 MG/0.3ML ~~LOC~~ SOLN
30.0000 mg | SUBCUTANEOUS | Status: DC
  Administered 2015-11-23: 30 mg via SUBCUTANEOUS
  Filled 2015-11-23: qty 0.3

## 2015-11-23 MED ORDER — BISACODYL 5 MG PO TBEC
10.0000 mg | DELAYED_RELEASE_TABLET | Freq: Every day | ORAL | Status: AC
  Administered 2015-11-23 – 2015-11-24 (×2): 10 mg via ORAL
  Filled 2015-11-23 (×2): qty 2

## 2015-11-23 NOTE — Progress Notes (Signed)
Physical Therapy Treatment Patient Details Name: Caroline Russo MRN: [id number] DOB: 05/06/1919 Today's Date: 11/23/2015    History of Present Illness Caroline Russo  is a 82 y.o. female with a known history of Arthritis, diet-controlled diabetes, non-Hodgkin's lymphoma on monthly Rituxan infusions was very independent at baseline presents to hospital secondary to worsening cough and pneumonia on chest x-ray from PCPs office.    PT Comments    Caroline Russo was limited due to +dizziness with transfers and short distance ambulation in her room.  BP 131/51 seated after ambulation.  She completed therapeutic exercise without issue.  Pt will benefit from continued skilled PT services to increase functional independence and safety.   Follow Up Recommendations  Home health PT     Equipment Recommendations   (has needed equipment)    Recommendations for Other Services       Precautions / Restrictions Precautions Precautions: Fall Precaution Comments: reports h/o 1 fall at home Restrictions Weight Bearing Restrictions: No    Mobility  Bed Mobility Overal bed mobility: Needs Assistance Bed Mobility: Supine to Sit     Supine to sit: Min guard     General bed mobility comments: Increased time and effort with use of bed rail to pull up to sitting  Transfers Overall transfer level: Needs assistance Equipment used: Rolling walker (2 wheeled) Transfers: Sit to/from Stand Sit to Stand: Min guard         General transfer comment: Slow to stand and cues for hand placement.  Pt reports mild dizziness  Ambulation/Gait Ambulation/Gait assistance: Min guard Ambulation Distance (Feet): 40 Feet Assistive device: Rolling walker (2 wheeled) Gait Pattern/deviations: Step-through pattern;Decreased stride length;Trunk flexed Gait velocity: decreased  Gait velocity interpretation: Below normal speed for age/gender General Gait Details: Slow, steady gait and flexed posture.  Pt reports  dizziness and declines ambulating in hall.   Stairs            Wheelchair Mobility    Modified Rankin (Stroke Patients Only)       Balance Overall balance assessment: Needs assistance Sitting-balance support: No upper extremity supported;Feet supported Sitting balance-Leahy Scale: Good     Standing balance support: Bilateral upper extremity supported;During functional activity Standing balance-Leahy Scale: Fair Standing balance comment: RW for support                    Cognition Arousal/Alertness: Awake/alert Behavior During Therapy: WFL for tasks assessed/performed Overall Cognitive Status: Within Functional Limits for tasks assessed                      Exercises General Exercises - Upper Extremity Shoulder Flexion: AROM;Both;10 reps;Seated General Exercises - Lower Extremity Ankle Circles/Pumps: AROM;Both;10 reps;Supine Long Arc Quad: AROM;Both;10 reps;Seated Hip Flexion/Marching: AROM;Both;10 reps;Seated    General Comments General comments (skin integrity, edema, etc.): BP 131/51 taken in sitting after ambulating      Pertinent Vitals/Pain Pain Assessment: No/denies pain    Home Living                      Prior Function            PT Goals (current goals can now be found in the care plan section) Acute Rehab PT Goals Patient Stated Goal: To go home. PT Goal Formulation: With patient Time For Goal Achievement: 11/28/15 Potential to Achieve Goals: Good Progress towards PT goals: Progressing toward goals    Frequency    Min 2X/week  PT Plan Current plan remains appropriate    Co-evaluation             End of Session Equipment Utilized During Treatment: Gait belt Activity Tolerance: Patient tolerated treatment well Patient left: in chair;with call bell/phone within reach;with nursing/sitter in room     Time: 1153-1220 PT Time Calculation (min) (ACUTE ONLY): 27 min  Charges:  $Therapeutic Exercise:  8-22 mins $Therapeutic Activity: 8-22 mins                    G Codes:        Collie Siad PT, DPT 11/23/2015, 1:15 PM

## 2015-11-23 NOTE — Progress Notes (Signed)
Initial HF Clinic appointment scheduled for December 16, 2015 at 9:00am. Thank you.

## 2015-11-23 NOTE — Discharge Instructions (Signed)
Heart Failure Clinic appointment on December 16, 2015 at 9:00am with Darylene Price, [place]. Please call [phone] to reschedule.

## 2015-11-23 NOTE — Progress Notes (Signed)
Inpatient Diabetes Program Recommendations  AACE/ADA: New Consensus Statement on Inpatient Glycemic Control (2015)  Target Ranges:  Prepandial:   less than 140 mg/dL      Peak postprandial:   less than 180 mg/dL (1-2 hours)      Critically ill patients:  140 - 180 mg/dL   Lab Results  Component Value Date   GLUCAP 108 (H) 05/26/2015   HGBA1C 6.2 12/22/2014    Review of Glycemic Control:  Results for AZZIE, HUNSAKER (MRN [id number]) as of 11/23/2015 11:56  Ref. Range 11/20/2015 12:45 11/21/2015 09:23 11/23/2015 04:43  Glucose Latest Ref Range: 65 - 99 mg/dL 211 (H) 255 (H) 205 (H)    Diabetes history: Diet controlled diabetes Outpatient Diabetes medications: None Current orders for Inpatient glycemic control: None  Inpatient Diabetes Program Recommendations:   If appropriate, consider adding CBG's tid with meals and sensitive Novolog correction.   Thanks, Adah Perl, RN, BC-ADM Inpatient Diabetes Coordinator Pager [phone] (8a-5p)

## 2015-11-23 NOTE — Progress Notes (Signed)
[place] at [place] NAME: Caroline Russo    MR#:  [id number]  DATE OF BIRTH:  January 10, 1920  SUBJECTIVE:  CHIEF COMPLAINT:   Chief Complaint  Patient presents with  . Cough  . Shortness of Breath   Cough better with lasix. Eating well afebrile  REVIEW OF SYSTEMS:    Review of Systems  Constitutional: Positive for weight loss. Negative for chills and fever.  HENT: Negative for sore throat.   Eyes: Negative for blurred vision, double vision and pain.  Respiratory: Positive for cough, sputum production and shortness of breath. Negative for hemoptysis and wheezing.   Cardiovascular: Positive for chest pain. Negative for palpitations, orthopnea and leg swelling.  Gastrointestinal: Negative for abdominal pain, constipation, diarrhea, heartburn, nausea and vomiting.  Genitourinary: Negative for dysuria and hematuria.  Musculoskeletal: Negative for back pain and joint pain.  Skin: Negative for rash.  Neurological: Negative for sensory change, speech change, focal weakness and headaches.  Endo/Heme/Allergies: Does not bruise/bleed easily.  Psychiatric/Behavioral: Negative for depression. The patient is not nervous/anxious.     DRUG ALLERGIES:   Allergies  Allergen Reactions  . Clindamycin Shortness Of Breath  . Moxifloxacin Shortness Of Breath    hallucinations  . Codeine Other (See Comments)    unknown  . Hydrocodone Other (See Comments)  . Prednisone     Hallucinations  . Sulfa Antibiotics Other (See Comments)  . Unasyn [Ampicillin-Sulbactam Sodium] Other (See Comments)    unknown  . Penicillins Itching and Rash    Has patient had a PCN reaction causing immediate rash, facial/tongue/throat swelling, SOB or lightheadedness with hypotension: Yes Has patient had a PCN reaction causing severe rash involving mucus membranes or skin necrosis: No Has patient had a PCN reaction that required hospitalization No Has patient had a PCN reaction  occurring within the last 10 years: No If all of the above answers are "NO", then may proceed with Cephalosporin use.      VITALS:  Blood pressure (!) 109/47, pulse 64, temperature 98.4 F (36.9 C), temperature source Oral, resp. rate 19, height 5\' 2"  (1.575 m), weight 55 kg (121 lb 3.2 oz), SpO2 94 %.  PHYSICAL EXAMINATION:   Physical Exam  GENERAL:  82 y.o.-year-old patient lying in the bed with no acute distress.  EYES: Pupils equal, round, reactive to light and accommodation. No scleral icterus. Extraocular muscles intact.  HEENT: Head atraumatic, normocephalic. Oropharynx and nasopharynx clear.  NECK:  Supple, no jugular venous distention. No thyroid enlargement, no tenderness.  LUNGS: Decreased air entry. Bilateral crackles. CARDIOVASCULAR: S1, S2 normal. No murmurs, rubs, or gallops.  ABDOMEN: Soft, nontender, nondistended. Bowel sounds present. No organomegaly or mass.  EXTREMITIES: No cyanosis, clubbing or edema b/l.    NEUROLOGIC: Cranial nerves II through XII are intact. No focal Motor or sensory deficits b/l.   PSYCHIATRIC: The patient is alert and oriented x 3.  SKIN: No obvious rash, lesion, or ulcer.   LABORATORY PANEL:   CBC  Recent Labs Lab 11/22/15 0519  WBC 3.3*  HGB 9.6*  HCT 28.2*  PLT 249   ------------------------------------------------------------------------------------------------------------------ Chemistries   Recent Labs Lab 11/23/15 0443  NA 136  K 3.6  CL 101  CO2 26  GLUCOSE 205*  BUN 20  CREATININE 0.96  CALCIUM 8.7*   ------------------------------------------------------------------------------------------------------------------  Cardiac Enzymes No results for input(s): TROPONINI in the last 168 hours. ------------------------------------------------------------------------------------------------------------------  RADIOLOGY:  Dg Chest Port 1 View  Result Date: 11/22/2015 CLINICAL DATA:  Follow-up  pneumonia.   Non-Hodgkin's lymphoma. EXAM: PORTABLE CHEST 1 VIEW COMPARISON:  11/20/2015 chest radiograph. FINDINGS: Right internal jugular MediPort terminates at the cavoatrial junction. Stable cardiomediastinal silhouette with top-normal heart size and aortic atherosclerosis. No pneumothorax. Small right pleural effusion is increased. Right basilar chest tube is stable in position. No significant left pleural effusion. Patchy consolidation in the basilar right upper lobe is increased. Patchy bibasilar opacities and bronchiectasis appears stable. IMPRESSION: 1. Increased patchy basilar right upper lobe consolidation. Stable patchy bibasilar lung opacities. Findings are most in keeping with multifocal infectious or aspiration pneumonia. 2. Bibasilar bronchiectasis. 3. Small right pleural effusion is increased. Electronically Signed   By: Ilona Sorrel M.D.   On: 11/22/2015 07:56     ASSESSMENT AND PLAN:   Caroline Russo  is a 82 y.o. female with a known history of Arthritis, diet-controlled diabetes, non-Hodgkin's lymphoma on monthly Rituxan infusions was very independent at baseline presents to hospital secondary to worsening cough and pneumonia on chest x-ray from PCPs office.  # Acute on chronic diastolic CHF Improved with lasix. PO lasix today. Will need PO lasix at discharge  # Bilateral pneumonia Has had outpatient antibiotics and prednisone with no improvement. Main concern is cough and weakness. Afebrile, not needing oxygen, normal WBC. Immunosuppressed with Hodgkin's lymphoma on treatment. Discontinued vancomycin as her MRSA PCR is negative. Continue cefepime. Sputum cx with GP and GN roda along with yeast. Will check with pulmonary if we need to treat for candida  # on-Hodgkin's lymphoma-follows at the cancer center with Dr.Brahmanday On monthly Rituxan infusions - has a pleurex catheter for recurrent right pleural effusions, has a port as well  # GERD- protonix  # Anxiety- xanax prn  #  DVT prophylaxis- lovenox  All the records are reviewed and case discussed with Care Management/Social Workerr. Management plans discussed with the patient, family and they are in agreement.  CODE STATUS: DNR  DVT Prophylaxis: SCDs  TOTAL TIME TAKING CARE OF THIS PATIENT: 35 minutes.   Discharge home in AM with [facility]  Hillary Bow R M.D on 11/23/2015 at 8:57 AM  Between 7am to 6pm - Pager - [phone]  After 6pm go to [email or web address] - password [email or web address] [facility]  Office  [phone]  CC: Primary care physician; Dicky Doe, MD  Note: This dictation was prepared with Dragon dictation along with smaller phrase technology. Any transcriptional errors that result from this process are unintentional.

## 2015-11-23 NOTE — Progress Notes (Signed)
PHARMACIST - PHYSICIAN COMMUNICATION  CONCERNING:  Enoxaparin (Lovenox) for DVT Prophylaxis   RECOMMENDATION: Patient was prescribed enoxaprin 40mg  q24 hours for VTE prophylaxis.   Filed Weights   11/20/15 1230 11/20/15 1647  Weight: 121 lb (54.9 kg) 121 lb 3.2 oz (55 kg)    Body mass index is 22.17 kg/m.  Estimated Creatinine Clearance: 28.3 mL/min (by C-G formula based on SCr of 0.96 mg/dL).  Based on [facility] patient is candidate for enoxaparin 30mg  every 24 hours based on CrCl <71ml/min  DESCRIPTION: Pharmacy has adjusted enoxaparin dose per [facility] policy.  Patient is now receiving enoxaparin 30mg  every 24 hours.  Nancy Fetter, PharmD Clinical Pharmacist  11/23/2015 8:48 AM

## 2015-11-24 ENCOUNTER — Inpatient Hospital Stay: Payer: Medicare Other

## 2015-11-24 ENCOUNTER — Ambulatory Visit: Payer: Medicare Other

## 2015-11-24 LAB — CULTURE, RESPIRATORY: CULTURE: NORMAL

## 2015-11-24 MED ORDER — CEFPODOXIME PROXETIL 200 MG PO TABS: 200.0000 mg | ORAL_TABLET | Freq: Two times a day (BID) | ORAL | 0 refills | Status: DC

## 2015-11-24 MED ORDER — MAGNESIUM OXIDE 400 (241.3 MG) MG PO TABS
400.0000 mg | ORAL_TABLET | ORAL | Status: AC
  Administered 2015-11-24 (×2): 400 mg via ORAL
  Filled 2015-11-24 (×2): qty 1

## 2015-11-24 MED ORDER — HEPARIN SOD (PORK) LOCK FLUSH 100 UNIT/ML IV SOLN
500.0000 [IU] | Freq: Once | INTRAVENOUS | Status: AC
  Administered 2015-11-24: 16:00:00 500 [IU] via INTRAVENOUS
  Filled 2015-11-24: qty 5

## 2015-11-24 MED ORDER — MAGNESIUM OXIDE 400 (241.3 MG) MG PO TABS: 400.0000 mg | ORAL_TABLET | Freq: Every day | ORAL | 0 refills | Status: DC

## 2015-11-24 MED ORDER — FUROSEMIDE 20 MG PO TABS: 20.0000 mg | ORAL_TABLET | ORAL | 0 refills | Status: DC

## 2015-11-24 MED ORDER — MAGNESIUM SULFATE 2 GM/50ML IV SOLN
2.0000 g | Freq: Once | INTRAVENOUS | Status: AC
  Administered 2015-11-24: 10:00:00 2 g via INTRAVENOUS
  Filled 2015-11-24: qty 50

## 2015-11-24 NOTE — Care Management (Signed)
Admitted to [facility] with the diagnosis of pneumonia. Lives with daughter, Clide Dales [phone] or [phone]). Seen Dr. Luan Pulling last Friday. [facility] in the past. No skilled facility. No home oxygen. Fair appetite. Fell last Friday. Private sitters in the home. Monday & Tuesday 12 noon-8:00pm, Wednesday, Thursday, and Friday 10:00am-8:00pm, and Saturday 10:00am- 5:00pm. Rolling walker and cane in the home. Prescriptions are filled at [facility] in [place]. Daughter will transport.  Physical therapy evaluation completed. Recommends home with home health/therapy. Would like [facility] for services in the home. Will update Floydene Flock, [facility] representative. Discharge today per Dr. Oleh Genin RN MSN [facility] Care Management [phone]

## 2015-11-24 NOTE — Progress Notes (Signed)
Physical Therapy Treatment Patient Details Name: Caroline Russo MRN: [id number] DOB: 03/24/1919 Today's Date: 11/24/2015    History of Present Illness Caroline Russo  is a 82 y.o. female with a known history of Arthritis, diet-controlled diabetes, non-Hodgkin's lymphoma on monthly Rituxan infusions was very independent at baseline presents to hospital secondary to worsening cough and pneumonia on chest x-ray from PCPs office.    PT Comments    Pt reports lightheadedness and fatigue with mobility.  BP taken seated at start of session 134/100 and seated at end of session 153/59.  Pt's SpO2 as low as 86% with activity or at rest regardless of pt on RA or 2L O2 as pt is unable to properly perform pursed lip breathing. She requires min guard assist for safe ambulation in room.  Pt will benefit from continued skilled PT services to increase functional independence and safety.   Follow Up Recommendations  Home health PT     Equipment Recommendations   (has needed equipment)    Recommendations for Other Services       Precautions / Restrictions Precautions Precautions: Fall Precaution Comments: reports h/o 1 fall at home Restrictions Weight Bearing Restrictions: No    Mobility  Bed Mobility Overal bed mobility: Needs Assistance Bed Mobility: Supine to Sit     Supine to sit: Modified independent (Device/Increase time)     General bed mobility comments: Increased time but no physical assist or cues  Transfers Overall transfer level: Needs assistance Equipment used: Rolling walker (2 wheeled) Transfers: Sit to/from Stand Sit to Stand: Min guard         General transfer comment: Slow to stand and cues for hand placement.  Pt reports mild dizziness  Ambulation/Gait Ambulation/Gait assistance: Min guard Ambulation Distance (Feet): 40 Feet Assistive device: Rolling walker (2 wheeled) Gait Pattern/deviations: Step-through pattern;Decreased stride length;Trunk flexed Gait  velocity: decreased  Gait velocity interpretation: Below normal speed for age/gender General Gait Details: Slow, steady gait and flexed posture.  Pt reports dizziness and declines ambulating in hall.   Stairs            Wheelchair Mobility    Modified Rankin (Stroke Patients Only)       Balance Overall balance assessment: Needs assistance Sitting-balance support: No upper extremity supported;Feet supported Sitting balance-Leahy Scale: Good     Standing balance support: No upper extremity supported;During functional activity Standing balance-Leahy Scale: Fair Standing balance comment: Pt able to stand at sink and wash hands without UE support                    Cognition Arousal/Alertness: Awake/alert Behavior During Therapy: WFL for tasks assessed/performed Overall Cognitive Status: Within Functional Limits for tasks assessed                      Exercises General Exercises - Lower Extremity Ankle Circles/Pumps: AROM;Both;10 reps;Supine Long Arc Quad: AROM;Both;10 reps;Seated Hip Flexion/Marching: AROM;Both;10 reps;Seated Other Exercises Other Exercises: Extensive education and demonstration of pursed lip breathing but pt unable to achieve proper technique for inhale    General Comments General comments (skin integrity, edema, etc.): BP taken seated at start of session 134/100 and seated at end of session 153/59.  Pt's SpO2 as low as 86% with activity or at rest regardless of pt on RA or 2L O2 as pt is unable to properly perform pursed lip breathing.       Pertinent Vitals/Pain Pain Assessment: No/denies pain    Home Living  Prior Function            PT Goals (current goals can now be found in the care plan section) Acute Rehab PT Goals Patient Stated Goal: To go home. PT Goal Formulation: With patient Time For Goal Achievement: 11/28/15 Potential to Achieve Goals: Good Progress towards PT goals: Progressing  toward goals    Frequency    Min 2X/week      PT Plan Current plan remains appropriate    Co-evaluation             End of Session Equipment Utilized During Treatment: Gait belt Activity Tolerance: Patient tolerated treatment well;Treatment limited secondary to medical complications (Comment) (hypoxia) Patient left: in chair;with call bell/phone within reach;with nursing/sitter in room     Time: 1101-1137 PT Time Calculation (min) (ACUTE ONLY): 36 min  Charges:  $Gait Training: 8-22 mins $Therapeutic Activity: 8-22 mins                    G Codes:       Collie Siad PT, DPT 11/24/2015, 1:36 PM

## 2015-11-24 NOTE — Plan of Care (Signed)
Problem: Activity: Goal: Ability to tolerate increased activity will improve Outcome: Progressing Patient ambulated around unit this shift and was able to maintain O2 levels of 94%.

## 2015-11-24 NOTE — Care Management (Signed)
Qualifies for Home oxygen. Will update [facility] representative [facility]. Shelbie Ammons RN MSN CCM Care Management [phone]

## 2015-11-24 NOTE — Progress Notes (Signed)
While rounding, Frederick made initial visit to room 104. Patient was alert and sitting in the chair beside the bed. Daughter was bedside. Pt was having some difficulty speaking as she was short of breath, but was encouraged by daughter to tell me her story. After a good conversation, Pt and daughter asked for prayer for easier breathing which was provided. CH is available for follow up as needed.    11/24/15 1300  Clinical Encounter Type  Visited With Patient;Patient and family together  Visit Type Initial;Spiritual support  Referral From Nurse  Spiritual Encounters  Spiritual Needs Prayer

## 2015-11-24 NOTE — Progress Notes (Signed)
Pt for dischargehome. Pt will go home with 02 due to sats dropping  .  Instructions discussed with pts dtr.  meds discussed . Diet activity and f/u discussed. Verbalize understanding . Home at this time via w/c w/o c/o.

## 2015-11-24 NOTE — Progress Notes (Signed)
SATURATION QUALIFICATIONS: (This note is used to comply with regulatory documentation for home oxygen)  Patient Saturations on Room Air at Rest = 91 Patient Saturations on Room Air while Ambulating = 80%  Patient Saturations on 2Liters of oxygen while Ambulating =81%  Please briefly explain why patient needs home oxygen:hx of chf  Recovers to 94 % at rest on 2l 02

## 2015-11-24 NOTE — Consult Note (Signed)
Name: Caroline Russo MRN: [id number] DOB: 1919/06/03    ADMISSION DATE:  11/20/2015 CONSULTATION DATE:  11/20/15  REFERRING MD :  Dr. Tressia Miners  CHIEF COMPLAINT:  Cough and Shortness of Breath  BRIEF PATIENT DESCRIPTION: This is a 82 yo female who presented to [facility] ER on 10/27 with c/o persistent cough, fatigue, weakness, and intermittent shortness of breath.  PCCM consulted for further management of recurrent unresolving Pneumonia  SIGNIFICANT EVENTS  10/27-Pt admitted to [facility] with acute respiratory failure secondary to HCAP 10/28-PCCM consulted for further management of recurrent unresolving pneumonia  STUDIES:  CT of Chest 10/27>>Multilobar airspace opacities, with associated airway thickening and lower lobe bronchiectasis, compatible with multilobar pneumonia. Coronary, aortic arch, and branch vessel atherosclerotic vascular disease. Dense mitral valve calcification. Right chest tube in place; pleural effusions have resolved. Small mediastinal lymph nodes appear stable.  HISTORY OF PRESENT ILLNESS:  This is a 82 yo female with a PMH of Palpitations, Orthostatic hypotension, Non-obstructive CAD, Non Hodgkin's lymphoma (on monthly Rituxan infusions), Recurrent right side pleural effusions secondary to Non Hodgkin's Lymphoma (Right chest Pleurex Cath in place), Heart murmur (09/2014 Echo: EF 55-60%, nl wm, moderate MS mild MR, mod dilated LA, mild-mod TR), Irregular cardiac rhythm, Hypercholesterolemia, HTN, DM II, Depression, Chronic chest pain, and Anxiety.  She presented to [facility] ER on 10/27 with worsening cough and pneumonia dx by CXR at PCP office.  Per ER notes according to pts daughter she receives monthly Rituxan infusions and at baseline she is very independent. Her last Rituxan infusion was last week since then she has been having fatigue, weakness, and worsening cough. Her respiratory symptoms started 5 months ago and she was initially diagnosed with acute bronchitis  and finished a  few courses of Prednisone, 2 rounds of Azithromycin, and 1 round of Levaquin total.  However, despite interventions she still has been having a persistent cough and occasional shortness of breath. She saw her PCP on 10/27 who instructed her to go to the ER due to bilateral pneumonia.  PCCM consulted 10/27 for further management of acute respiratory failure secondary to HCAP.    SUBJECTIVE: patient feeling better since admission Has responded to lasix and abx therapy Ambulating with minimal assistance      REVIEW OF SYSTEMS:  Positives in BOLD Constitutional: fever, chills, weight loss, malaise/fatigue and diaphoresis.  HENT: chronic hearing loss, ear pain, nosebleeds, congestion, sore throat, neck pain, tinnitus and ear discharge.   Eyes: Negative for blurred vision, double vision, photophobia, pain, discharge and redness.  Respiratory: cough, hemoptysis, sputum production, shortness of breath, wheezing and stridor.   Cardiovascular: chest pain, palpitations, orthopnea, claudication, leg swelling and PND.  Gastrointestinal: Negative for heartburn, nausea, vomiting, abdominal pain, diarrhea, constipation, blood in stool and melena.  Genitourinary: Negative for dysuria, urgency, frequency, hematuria and flank pain.  Musculoskeletal: Negative for myalgias, back pain, joint pain and falls.  Skin: Negative for itching and rash.  Neurological: Negative for dizziness, tingling, tremors, sensory change, speech change, focal weakness, seizures, loss of consciousness, weakness and headaches.  Endo/Heme/Allergies: Negative for environmental allergies and polydipsia. Does not bruise/bleed easily.  SUBJECTIVE:  Pt c/o persistent non productive cough states Tessalon pearls gives her some relief.  She states the Tussionex caused her to have nausea, otherwise no other complaints.  VITAL SIGNS: Temp:  [97.6 F (36.4 C)-97.9 F (36.6 C)] 97.7 F (36.5 C) (10/31 0434) Pulse Rate:  [55-91] 55 (10/31  0434) Resp:  [20-23] 23 (10/31 0434) BP: (87-120)/(40-60) 117/60 (10/31 0434)  SpO2:  [94 %-95 %] 95 % (10/31 0434)  PHYSICAL EXAMINATION: General: resting in bed Neuro:  Alert and oriented, follows commands HEENT:  Supple, no JVD Cardiovascular:  s1s2, no rubs or gallops, 2/6 systolic murmur Lungs: course bilateral bases, diminished throughout, even, non labored Abdomen:  +BS x4, soft, non tender, non distended Musculoskeletal:  Normal bulk and tone Skin:  Intact no rashes or lesions, right chest portacath dressing dry and intact   Recent Labs Lab 11/20/15 1245 11/21/15 0923 11/23/15 0443  NA 133* 133* 136  K 4.5 4.2 3.6  CL 101 104 101  CO2 20* 21* 26  BUN 17 17 20   CREATININE 0.88 0.88 0.96  GLUCOSE 211* 255* 205*    Recent Labs Lab 11/20/15 1245 11/21/15 0923 11/22/15 0519  HGB 12.5 10.3* 9.6*  HCT 39.1 30.6* 28.2*  WBC 3.5* 3.8 3.3*  PLT 355 262 249   Dg Chest 2 View  Result Date: 11/24/2015 CLINICAL DATA:  CHF, diabetes EXAM: CHEST  2 VIEW COMPARISON:  Portable chest x-ray of October 29th 2017 FINDINGS: The left lung remains hyperinflated and clear. There is persistent density in the inferior aspect of the right upper lobe. AP right-sided chest tube is in stable position with the tip projecting in the medial cardiophrenic angle. A small right pleural effusion persists. There is no pneumothorax. There is no mediastinal shift. The heart is normal in size. There is dense calcification in the wall of the thoracic aorta. The bony thorax exhibits no acute abnormality. IMPRESSION: COPD. Right pleural effusion. Infiltrate inferiorly in the right upper lobe and patchy density in the right middle lobe not greatly changed from previous study consistent with pneumonia. No CHF. Aortic atherosclerosis. Electronically Signed   By: David  Martinique M.D.   On: 11/24/2015 07:26    ASSESSMENT / PLAN: Acute respiratory failure secondary to HCAP Hx: Recurrent right pleural effusions  secondary to non-Hodgkin's lymphoma (Pleurex catheter in place) Plan: Continue abx-switching to oral regimen today Cultures growing yeast/GPR's Continue bronchodilators Continue tessalon  Supplemental O2 as needed to maintain O2 sats >92% Pulmonary Hygiene   Patient to be discharged today. Will follow up in 1-2 weeks with me. Will also follow up cultures  I have personally obtained a history, examined the patient, evaluated Pertinent laboratory and [employer]/imaging results, and  formulated the assessment and plan   Patient/Family are satisfied with Plan of action and management. All questions answered  Corrin Parker, M.D.  [facility]  Medical Director [place] Director [facility] Cardio-Pulmonary Department

## 2015-11-25 ENCOUNTER — Other Ambulatory Visit: Payer: Self-pay | Admitting: Cardiovascular Disease

## 2015-11-25 ENCOUNTER — Telehealth: Payer: Self-pay | Admitting: Cardiovascular Disease

## 2015-11-25 ENCOUNTER — Inpatient Hospital Stay: Payer: Medicare Other | Admitting: Internal Medicine

## 2015-11-25 DIAGNOSIS — I110 Hypertensive heart disease with heart failure: Secondary | ICD-10-CM | POA: Diagnosis not present

## 2015-11-25 DIAGNOSIS — J984 Other disorders of lung: Secondary | ICD-10-CM | POA: Diagnosis not present

## 2015-11-25 DIAGNOSIS — R7989 Other specified abnormal findings of blood chemistry: Secondary | ICD-10-CM | POA: Diagnosis not present

## 2015-11-25 DIAGNOSIS — J168 Pneumonia due to other specified infectious organisms: Secondary | ICD-10-CM | POA: Diagnosis not present

## 2015-11-25 DIAGNOSIS — C8290 Follicular lymphoma, unspecified, unspecified site: Secondary | ICD-10-CM | POA: Diagnosis not present

## 2015-11-25 DIAGNOSIS — R918 Other nonspecific abnormal finding of lung field: Secondary | ICD-10-CM | POA: Diagnosis not present

## 2015-11-25 DIAGNOSIS — I517 Cardiomegaly: Secondary | ICD-10-CM | POA: Diagnosis not present

## 2015-11-25 DIAGNOSIS — Z7951 Long term (current) use of inhaled steroids: Secondary | ICD-10-CM | POA: Diagnosis not present

## 2015-11-25 DIAGNOSIS — I441 Atrioventricular block, second degree: Secondary | ICD-10-CM | POA: Diagnosis not present

## 2015-11-25 DIAGNOSIS — J90 Pleural effusion, not elsewhere classified: Secondary | ICD-10-CM | POA: Diagnosis not present

## 2015-11-25 DIAGNOSIS — Z880 Allergy status to penicillin: Secondary | ICD-10-CM | POA: Diagnosis not present

## 2015-11-25 DIAGNOSIS — E119 Type 2 diabetes mellitus without complications: Secondary | ICD-10-CM | POA: Diagnosis not present

## 2015-11-25 DIAGNOSIS — B348 Other viral infections of unspecified site: Secondary | ICD-10-CM | POA: Diagnosis not present

## 2015-11-25 DIAGNOSIS — I440 Atrioventricular block, first degree: Secondary | ICD-10-CM | POA: Diagnosis not present

## 2015-11-25 DIAGNOSIS — R0609 Other forms of dyspnea: Secondary | ICD-10-CM | POA: Diagnosis not present

## 2015-11-25 DIAGNOSIS — Z6821 Body mass index (BMI) 21.0-21.9, adult: Secondary | ICD-10-CM | POA: Diagnosis not present

## 2015-11-25 DIAGNOSIS — J189 Pneumonia, unspecified organism: Secondary | ICD-10-CM | POA: Diagnosis not present

## 2015-11-25 DIAGNOSIS — R531 Weakness: Secondary | ICD-10-CM | POA: Diagnosis not present

## 2015-11-25 DIAGNOSIS — I342 Nonrheumatic mitral (valve) stenosis: Secondary | ICD-10-CM | POA: Diagnosis not present

## 2015-11-25 DIAGNOSIS — I509 Heart failure, unspecified: Secondary | ICD-10-CM | POA: Diagnosis not present

## 2015-11-25 DIAGNOSIS — R0602 Shortness of breath: Secondary | ICD-10-CM | POA: Diagnosis not present

## 2015-11-25 DIAGNOSIS — I340 Nonrheumatic mitral (valve) insufficiency: Secondary | ICD-10-CM | POA: Diagnosis not present

## 2015-11-25 DIAGNOSIS — Z66 Do not resuscitate: Secondary | ICD-10-CM | POA: Diagnosis present

## 2015-11-25 DIAGNOSIS — R05 Cough: Secondary | ICD-10-CM | POA: Diagnosis not present

## 2015-11-25 DIAGNOSIS — C8213 Follicular lymphoma grade II, intra-abdominal lymph nodes: Secondary | ICD-10-CM | POA: Diagnosis present

## 2015-11-25 DIAGNOSIS — I5032 Chronic diastolic (congestive) heart failure: Secondary | ICD-10-CM | POA: Diagnosis not present

## 2015-11-25 DIAGNOSIS — J1289 Other viral pneumonia: Secondary | ICD-10-CM | POA: Diagnosis not present

## 2015-11-25 LAB — CULTURE, BLOOD (ROUTINE X 2)
Culture: NO GROWTH
Culture: NO GROWTH

## 2015-11-25 NOTE — Telephone Encounter (Signed)
lmov Pt needs to contact pcp for albuterol inhaler.

## 2015-11-25 NOTE — Telephone Encounter (Signed)
lmov pt needs to contact pcp for albuterol inhaler request Rx.

## 2015-11-26 LAB — CRYPTOCOCCUS ANTIGEN, SERUM: CRYPTOCOCCUS ANTIGEN, SERUM: NEGATIVE

## 2015-11-26 NOTE — Discharge Summary (Signed)
[facility] NAME: Caroline Russo    MR#:  [id number]  DATE OF BIRTH:  1919-05-12  DATE OF ADMISSION:  11/20/2015 ADMITTING PHYSICIAN: Caroline Lighter, MD  DATE OF DISCHARGE: 11/24/2015  6:02 PM  PRIMARY CARE PHYSICIAN: Caroline Doe, MD   ADMISSION DIAGNOSIS:  Recurrent pneumonia [J18.9] HCAP (healthcare-associated pneumonia) [J18.9]  DISCHARGE DIAGNOSIS:  Active Problems:   Pneumonia   SECONDARY DIAGNOSIS:   Past Medical History:  Diagnosis Date  . Anxiety   . Cataract   . Chronic chest pain    a. nonobs cath 2002.  Marland Kitchen Decreased appetite   . Depression   . DM2 (diabetes mellitus, type 2) (HCC)    insulin requiring  . Heart murmur    a. 09/2014 Echo: EF 55-60%, nl wm, mod MS, mild MR, mod dil LA, mild-mod TR.  Marland Kitchen HTN (hypertension)   . Hypercholesterolemia   . Hypomagnesemia 06/13/2014  . Irregular cardiac rhythm   . Macular degeneration   . Murmur    hx  . Non Hodgkin's lymphoma ([place])    On monthly Rituxan infusions  . Non-obstructive CAD   . Orthostatic hypotension   . Palpitations    a. 01/2011 Holter monitor showed frequent PACs, sinus bradycardia  . Tinnitus    chronic     ADMITTING HISTORY  Caroline Russo  is a 82 y.o. female with a known history of Arthritis, diet-controlled diabetes, non-Hodgkin's lymphoma on monthly Rituxan infusions was very independent at baseline presents to hospital secondary to worsening cough and pneumonia on chest x-ray from PCPs office. Patient is hard of hearing and most of the history is obtained from her daughter who lives with her. Her respiratory symptoms started about 5 months ago. She was diagnosed with acute bronchitis and has finished a few courses of prednisone and she 2 rounds of azithromycin and 1 round of Levaquin. She has been having persistent cough and occasional difficulty breathing. Patient denies any chest pain, fevers or chills or nausea or vomiting. She has  seen an outpatient pulmonologist and cardiologist for the same. Her last Rituxan infusion was last week. Since then she has noted significant fatigue, weakness and worsening cough. So they went to PCPs office today and had an x-ray done. It shows worsening bilateral infiltrates. So she was sent in for recurrent unresolving pneumonia.  HOSPITAL COURSE:   Caroline Russo a 82 y.o. femalewith a known history of Arthritis, diet-controlled diabetes, non-Hodgkin's lymphoma on monthly Rituxan infusions was very independent at baseline presents to hospital secondary to worsening cough and pneumonia on chest x-ray from PCPs office.  # Acute on chronic diastolic CHF Improved with IV Lasix. Transition to oral Lasix. She will be on Lasix every other day. Follow-up with cardiology as outpatient.  # Bilateral pneumonia Has had outpatient antibiotics and prednisone with no improvement. Main concern is cough and weakness. Afebrile, not needing oxygen, normal WBC. Immunosuppressed with Hodgkin's lymphoma on treatment. Initially started on vancomycin and cefepime. Vancomycin stopped after her MRSA PCR was negative. Cefepime continued during the hospital stay and she is being changed to cefpodoxime time at discharge. This was discussed with Caroline Russo her pulmonologist who saw the patient on day of discharge. Sputum cultures have rated budding yeast with normal respiratory flora. Caroline Russo to follow-up on sputum cultures to make sure the budding yeast is not cryptococcus. It's unlikely as patient does not seem acutely ill with fungal infection. Likely colonization.  # on-Hodgkin's lymphoma-follows at  the cancer center with Caroline Russo On monthly Rituxan infusions - has a pleurex catheter for recurrent right pleural effusions, has a port as well  # GERD- protonix  # Anxiety- xanax prn  Patient saturations were stable at rest on room air. But on ambulation saturations dropped less than 88% and she is  being set up on home oxygen. Follow-up with Caroline Russo with pulmonary as outpatient.  Stable for discharge home. Home health physical therapy and nursing set up  CONSULTS OBTAINED:  Treatment Team:  Caroline Ramsay, MD  DRUG ALLERGIES:   Allergies  Allergen Reactions  . Clindamycin Shortness Of Breath  . Moxifloxacin Shortness Of Breath    hallucinations  . Codeine Other (See Comments)    unknown  . Hydrocodone Other (See Comments)  . Prednisone     Hallucinations  . Sulfa Antibiotics Other (See Comments)  . Unasyn [Ampicillin-Sulbactam Sodium] Other (See Comments)    unknown  . Penicillins Itching and Rash    Has patient had a PCN reaction causing immediate rash, facial/tongue/throat swelling, SOB or lightheadedness with hypotension: Yes Has patient had a PCN reaction causing severe rash involving mucus membranes or skin necrosis: No Has patient had a PCN reaction that required hospitalization No Has patient had a PCN reaction occurring within the last 10 years: No If all of the above answers are "NO", then may proceed with Cephalosporin use.      DISCHARGE MEDICATIONS:   Discharge Medication List as of 11/24/2015 11:23 AM    START taking these medications   Details  cefpodoxime (VANTIN) 200 MG tablet Take 1 tablet (200 mg total) by mouth 2 (two) times daily., Starting Tue 11/24/2015, Normal    furosemide (LASIX) 20 MG tablet Take 1 tablet (20 mg total) by mouth every other day., Starting Tue 11/24/2015, Normal    magnesium oxide (MAG-OX) 400 (241.3 Mg) MG tablet Take 1 tablet (400 mg total) by mouth daily., Starting Tue 11/24/2015, Normal      CONTINUE these medications which have NOT CHANGED   Details  albuterol (PROVENTIL HFA;VENTOLIN HFA) 108 (90 Base) MCG/ACT inhaler Inhale 1-2 puffs into the lungs every 4 (four) hours as needed for wheezing or shortness of breath., Starting Thu 09/10/2015, Normal    ALPRAZolam (XANAX) 0.25 MG tablet Take 1 tablet (0.25 mg  total) by mouth every 6 (six) hours as needed., Starting Wed 11/05/2014, Print    benzonatate (TESSALON) 100 MG capsule TAKE 1 CAPSULE(100 MG) BY MOUTH THREE TIMES DAILY AS NEEDED FOR COUGH, Normal    cyanocobalamin 500 MCG tablet Take 1,000 mcg by mouth daily., Historical Med    docusate sodium (DOK) 100 MG capsule TAKE 1 CAPSULE(100 MG) BY MOUTH TWICE DAILY, Normal    feeding supplement, ENSURE ENLIVE, (ENSURE ENLIVE) LIQD Take 237 mLs by mouth 3 (three) times daily between meals., Starting Wed 07/22/2015, Normal    fluticasone (FLONASE) 50 MCG/ACT nasal spray Place 1 spray into both nostrils daily., Starting Thu 05/07/2015, Normal    meclizine (ANTIVERT) 12.5 MG tablet Take 12.5 mg by mouth 3 (three) times daily as needed., Historical Med    megestrol (MEGACE) 40 MG/ML suspension Take 10 mLs (400 mg total) by mouth daily., Starting Mon 07/27/2015, Normal    Multiple Vitamin (MULTIVITAMIN WITH MINERALS) TABS tablet Take 1 tablet by mouth daily., Historical Med    nitroGLYCERIN (NITROSTAT) 0.4 MG SL tablet Place 0.4 mg under the tongue every 5 (five) minutes x 3 doses as needed for chest pain. Reported on 07/15/2015,  Historical Med    polyethylene glycol powder (GLYCOLAX/MIRALAX) powder Take 17 g by mouth daily as needed., Starting Tue 02/17/2015, Normal    SLOW-MAG 71.5-119 MG TBEC SR tablet TAKE 1 TABLET (64 MG TOTAL) BY MOUTH 2 (TWO) TIMES DAILY., Normal    traMADol (ULTRAM) 50 MG tablet Take 1 tablet by mouth every 4-6 hours as needed for pain, Print    lidocaine-prilocaine (EMLA) cream APPLY TOPICALLY AS NEEDED., Normal    Respiratory Therapy Supplies (FLUTTER) DEVI Use as directed, Print      STOP taking these medications     cyclobenzaprine (FLEXERIL) 10 MG tablet      Fluticasone-Salmeterol (ADVAIR) 250-50 MCG/DOSE AEPB      pantoprazole (PROTONIX) 40 MG tablet         Today   VITAL SIGNS:  Blood pressure (!) 119/59, pulse 79, temperature 98.4 F (36.9 C), temperature  source Oral, resp. rate (!) 23, height 5\' 2"  (1.575 m), weight 55 kg (121 lb 3.2 oz), SpO2 100 %.  I/O:  No intake or output data in the 24 hours ending 11/26/15 1603  PHYSICAL EXAMINATION:  Physical Exam  GENERAL:  82 y.o.-year-old patient lying in the bed with no acute distress.  LUNGS: Normal breath sounds bilaterally, no wheezing, rales,rhonchi or crepitation. No use of accessory muscles of respiration.  CARDIOVASCULAR: S1, S2 normal. No murmurs, rubs, or gallops.  ABDOMEN: Soft, non-tender, non-distended. Bowel sounds present. No organomegaly or mass.  NEUROLOGIC: Moves all 4 extremities. PSYCHIATRIC: The patient is alert and awake SKIN: No obvious rash, lesion, or ulcer.   DATA REVIEW:   CBC  Recent Labs Lab 11/22/15 0519  WBC 3.3*  HGB 9.6*  HCT 28.2*  PLT 249    Chemistries   Recent Labs Lab 11/23/15 0443  NA 136  K 3.6  CL 101  CO2 26  GLUCOSE 205*  BUN 20  CREATININE 0.96  CALCIUM 8.7*  MG 1.3*    Cardiac Enzymes No results for input(s): TROPONINI in the last 168 hours.  Microbiology Results  Results for orders placed or performed during the hospital encounter of 11/20/15  Blood culture (routine x 2)     Status: None   Collection Time: 11/20/15  2:00 PM  Result Value Ref Range Status   Specimen Description BLOOD RIGHT ARM  Final   Special Requests AER 8CC ANA 8CC  Final   Culture NO GROWTH 5 DAYS  Final   Report Status 11/25/2015 FINAL  Final  Blood culture (routine x 2)     Status: None   Collection Time: 11/20/15  2:01 PM  Result Value Ref Range Status   Specimen Description BLOOD LEFT ARM  Final   Special Requests AER 8CC ANA 8CC  Final   Culture NO GROWTH 5 DAYS  Final   Report Status 11/25/2015 FINAL  Final  MRSA PCR Screening     Status: None   Collection Time: 11/20/15  6:55 PM  Result Value Ref Range Status   MRSA by PCR NEGATIVE NEGATIVE Final    Comment:        The GeneXpert MRSA Assay (FDA approved for NASAL specimens only),  is one component of a comprehensive MRSA colonization surveillance program. It is not intended to diagnose MRSA infection nor to guide or monitor treatment for MRSA infections.   Culture, expectorated sputum-assessment     Status: None   Collection Time: 11/21/15  1:41 PM  Result Value Ref Range Status   Specimen Description SPU  Final  Special Requests Immunocompromised  Final   Sputum evaluation THIS SPECIMEN IS ACCEPTABLE FOR SPUTUM CULTURE  Final   Report Status 11/21/2015 FINAL  Final  Culture, respiratory (NON-Expectorated)     Status: None   Collection Time: 11/21/15  1:41 PM  Result Value Ref Range Status   Specimen Description SPU  Final   Special Requests Immunocompromised Reflexed from [id number]  Final   Gram Stain   Final    MODERATE WBC PRESENT, PREDOMINANTLY PMN RARE SQUAMOUS EPITHELIAL CELLS PRESENT ABUNDANT GRAM POSITIVE RODS RARE BUDDING YEAST SEEN RARE GRAM VARIABLE ROD    Culture   Final    Consistent with normal respiratory flora. Performed at [facility]    Report Status 11/24/2015 FINAL  Final    RADIOLOGY:  No results found.  Follow up with PCP in 1 week.  Management plans discussed with the patient, family and they are in agreement.  CODE STATUS:  Code Status History    Date Active Date Inactive Code Status Order ID Comments User Context   11/22/2015  7:14 AM 11/24/2015  9:08 PM DNR [id number]  Hillary Bow, MD Inpatient   11/20/2015  4:56 PM 11/22/2015  7:14 AM Full Code [id number]  Caroline Lighter, MD Inpatient   07/19/2015  3:49 PM 07/22/2015  3:10 PM DNR [id number]  Bettey Costa, MD ED    Questions for Most Recent Historical Code Status (Order [id number])    Question Answer Comment   In the event of cardiac or respiratory ARREST Do not call a "code blue"    In the event of cardiac or respiratory ARREST Do not perform Intubation, CPR, defibrillation or ACLS    In the event of cardiac or respiratory ARREST Use medication by any route,  position, wound care, and other measures to relive pain and suffering. May use oxygen, suction and manual treatment of airway obstruction as needed for comfort.         Advance Directive Documentation   Flowsheet Row Most Recent Value  Type of Advance Directive  Healthcare Power of Attorney, Living will  Pre-existing out of facility DNR order (yellow form or pink MOST form)  No data  "MOST" Form in Place?  No data      TOTAL TIME TAKING CARE OF THIS PATIENT ON DAY OF DISCHARGE: more than 30 minutes.   Hillary Bow R M.D on 11/26/2015 at 4:03 PM  Between 7am to 6pm - Pager - [phone]  After 6pm go to [email or web address] - password [email or web address] [facility]  Office  [phone]  CC: Primary care physician; Caroline Doe, MD  Note: This dictation was prepared with Dragon dictation along with smaller phrase technology. Any transcriptional errors that result from this process are unintentional.

## 2015-11-28 DIAGNOSIS — B348 Other viral infections of unspecified site: Secondary | ICD-10-CM | POA: Insufficient documentation

## 2015-12-03 ENCOUNTER — Other Ambulatory Visit: Payer: Self-pay | Admitting: Cardiovascular Disease

## 2015-12-03 DIAGNOSIS — J181 Lobar pneumonia, unspecified organism: Secondary | ICD-10-CM | POA: Diagnosis not present

## 2015-12-03 DIAGNOSIS — I5032 Chronic diastolic (congestive) heart failure: Secondary | ICD-10-CM | POA: Diagnosis not present

## 2015-12-03 DIAGNOSIS — I441 Atrioventricular block, second degree: Secondary | ICD-10-CM | POA: Diagnosis not present

## 2015-12-03 DIAGNOSIS — I350 Nonrheumatic aortic (valve) stenosis: Secondary | ICD-10-CM

## 2015-12-03 DIAGNOSIS — I110 Hypertensive heart disease with heart failure: Secondary | ICD-10-CM | POA: Diagnosis not present

## 2015-12-03 DIAGNOSIS — Z9981 Dependence on supplemental oxygen: Secondary | ICD-10-CM | POA: Diagnosis not present

## 2015-12-03 DIAGNOSIS — C8213 Follicular lymphoma grade II, intra-abdominal lymph nodes: Secondary | ICD-10-CM | POA: Diagnosis not present

## 2015-12-03 DIAGNOSIS — E119 Type 2 diabetes mellitus without complications: Secondary | ICD-10-CM | POA: Diagnosis not present

## 2015-12-07 ENCOUNTER — Encounter: Payer: Self-pay | Admitting: Cardiovascular Disease

## 2015-12-07 ENCOUNTER — Inpatient Hospital Stay: Payer: Medicare Other | Attending: Internal Medicine | Admitting: Internal Medicine

## 2015-12-07 VITALS — BP 149/72 | HR 111 | Temp 97.2°F | Resp 18 | Wt 114.2 lb

## 2015-12-07 DIAGNOSIS — Z79899 Other long term (current) drug therapy: Secondary | ICD-10-CM

## 2015-12-07 DIAGNOSIS — I951 Orthostatic hypotension: Secondary | ICD-10-CM | POA: Diagnosis not present

## 2015-12-07 DIAGNOSIS — I10 Essential (primary) hypertension: Secondary | ICD-10-CM

## 2015-12-07 DIAGNOSIS — Z9221 Personal history of antineoplastic chemotherapy: Secondary | ICD-10-CM

## 2015-12-07 DIAGNOSIS — F329 Major depressive disorder, single episode, unspecified: Secondary | ICD-10-CM

## 2015-12-07 DIAGNOSIS — E119 Type 2 diabetes mellitus without complications: Secondary | ICD-10-CM

## 2015-12-07 DIAGNOSIS — E7800 Pure hypercholesterolemia, unspecified: Secondary | ICD-10-CM

## 2015-12-07 DIAGNOSIS — C8213 Follicular lymphoma grade II, intra-abdominal lymph nodes: Secondary | ICD-10-CM | POA: Diagnosis not present

## 2015-12-07 DIAGNOSIS — F419 Anxiety disorder, unspecified: Secondary | ICD-10-CM | POA: Diagnosis not present

## 2015-12-07 DIAGNOSIS — Z8052 Family history of malignant neoplasm of bladder: Secondary | ICD-10-CM | POA: Diagnosis not present

## 2015-12-07 DIAGNOSIS — J90 Pleural effusion, not elsewhere classified: Secondary | ICD-10-CM

## 2015-12-07 DIAGNOSIS — I499 Cardiac arrhythmia, unspecified: Secondary | ICD-10-CM | POA: Diagnosis not present

## 2015-12-07 DIAGNOSIS — R011 Cardiac murmur, unspecified: Secondary | ICD-10-CM | POA: Diagnosis not present

## 2015-12-07 DIAGNOSIS — Z801 Family history of malignant neoplasm of trachea, bronchus and lung: Secondary | ICD-10-CM | POA: Diagnosis not present

## 2015-12-07 DIAGNOSIS — Z87891 Personal history of nicotine dependence: Secondary | ICD-10-CM

## 2015-12-07 DIAGNOSIS — Z8701 Personal history of pneumonia (recurrent): Secondary | ICD-10-CM | POA: Diagnosis not present

## 2015-12-07 DIAGNOSIS — Z800 Family history of malignant neoplasm of digestive organs: Secondary | ICD-10-CM

## 2015-12-07 DIAGNOSIS — I2510 Atherosclerotic heart disease of native coronary artery without angina pectoris: Secondary | ICD-10-CM

## 2015-12-07 DIAGNOSIS — Z794 Long term (current) use of insulin: Secondary | ICD-10-CM | POA: Diagnosis not present

## 2015-12-07 DIAGNOSIS — R079 Chest pain, unspecified: Secondary | ICD-10-CM | POA: Diagnosis not present

## 2015-12-07 DIAGNOSIS — I110 Hypertensive heart disease with heart failure: Secondary | ICD-10-CM | POA: Diagnosis not present

## 2015-12-07 DIAGNOSIS — I441 Atrioventricular block, second degree: Secondary | ICD-10-CM | POA: Diagnosis not present

## 2015-12-07 DIAGNOSIS — I5032 Chronic diastolic (congestive) heart failure: Secondary | ICD-10-CM | POA: Diagnosis not present

## 2015-12-07 DIAGNOSIS — J181 Lobar pneumonia, unspecified organism: Secondary | ICD-10-CM | POA: Diagnosis not present

## 2015-12-07 NOTE — Telephone Encounter (Signed)
This encounter was created in error - please disregard.

## 2015-12-07 NOTE — Progress Notes (Signed)
Patient is here for follow up, she was recently in the hospital for pneumonia

## 2015-12-07 NOTE — Assessment & Plan Note (Addendum)
#   Follicular lymphoma G-2  With recurrence- and right external iliac lymph node  [May 2017 on PET scan]; however recurrent pleural effusion-possibly related to lymphoma.  Currently on single agent rituximab a monthly basis. Clinical course complicated by multifocal pneumonia. Hold rituximab at this time.  I would favor surveillance unless patient has obvious evidence of progressive lymphoma- given the infectious issues. Check a PET scan in 2 months.  # Multifocal pneumonia- status post Zyvox; given the improvement in breathing/Pleurx catheter drainage- I suspect patient's symptoms are mostly from pneumonia; less likely from lymphoma.  # The above plan of care was discussed with the patient and daughter in detail.

## 2015-12-07 NOTE — Progress Notes (Signed)
[place] OFFICE PROGRESS NOTE  Patient Care Team: Arlis Porta., MD as PCP - General (Unknown Physician Specialty) Minna Merritts, MD as Consulting Physician (Cardiology)  No matching staging information was found for the patient.   Oncology History   # 2015- Biopsy of para-aortic lymph node is consistent with follicular lymphoma, clinically stage as 3A Diagnosis in July of 2015. Started on rituximab on July 31, 2013 2. Started on [place]  and Rituxan on November 04, 2013 3 maintenance rituximab started from April of 2016  # Mod AS/       Follicular lymphoma grade II of intra-abdominal lymph nodes ([place])   07/24/2015 Initial Diagnosis    Follicular lymphoma grade II of intra-abdominal lymph nodes (HCC)       INTERVAL HISTORY:  Caroline Russo 82 y.o.  female pleasant patient above history of Recurrent follicular lymphoma/ also a right pleural effusion presumed from lymphoma is here for follow-up.   In the interim patient was admitted to the hospital at [facility] for a multifocal pneumonia. Patient was treated with Zyvox. Patient has noted significant improvement of her cough/breathing; decreased output from the Pleurx catheter.  Her appetite is improving onMarinol. No nausea no vomiting.   Patient denies any fevers. Denies any nausea vomiting.No blood. For the first time in many months as per the daughter patient has felt stronger.  REVIEW OF SYSTEMS:  A complete 10 point review of system is done which is negative except mentioned above/history of present illness.   PAST MEDICAL HISTORY :  Past Medical History:  Diagnosis Date  . Anxiety   . Cataract   . Chronic chest pain    a. nonobs cath 2002.  Marland Kitchen Decreased appetite   . Depression   . DM2 (diabetes mellitus, type 2) (HCC)    insulin requiring  . Heart murmur    a. 09/2014 Echo: EF 55-60%, nl wm, mod MS, mild MR, mod dil LA, mild-mod TR.  Marland Kitchen HTN (hypertension)   . Hypercholesterolemia   .  Hypomagnesemia 06/13/2014  . Irregular cardiac rhythm   . Macular degeneration   . Murmur    hx  . Non Hodgkin's lymphoma ([place])    On monthly Rituxan infusions  . Non-obstructive CAD   . Orthostatic hypotension   . Palpitations    a. 01/2011 Holter monitor showed frequent PACs, sinus bradycardia  . Tinnitus    chronic    PAST SURGICAL HISTORY :   Past Surgical History:  Procedure Laterality Date  . ANKLE SURGERY Left   . CARDIAC CATHETERIZATION  2002  . INSERTION / PLACEMENT PLEURAL CATHETER    . KNEE SURGERY Left   . PARTIAL HYSTERECTOMY    . TOOTH EXTRACTION    . TUMOR REMOVAL  2012   benign neck tumor removed    FAMILY HISTORY :   Family History  Problem Relation Age of Onset  . Heart disease Mother   . Lung cancer Brother     smoked  . Bladder Cancer Brother   . Stomach cancer Brother     SOCIAL HISTORY:   Social History  Substance Use Topics  . Smoking status: Former Smoker    Packs/day: 0.25    Years: 7.00    Types: Cigarettes    Quit date: 01/25/1988  . Smokeless tobacco: Never Used  . Alcohol use Yes     Comment: drinks beer    ALLERGIES:  is allergic to clindamycin; moxifloxacin; codeine; hydrocodone; prednisone; sulfa antibiotics;  unasyn [ampicillin-sulbactam sodium]; and penicillins.  MEDICATIONS:  Current Outpatient Prescriptions  Medication Sig Dispense Refill  . albuterol (PROVENTIL HFA;VENTOLIN HFA) 108 (90 Base) MCG/ACT inhaler Inhale 1-2 puffs into the lungs every 4 (four) hours as needed for wheezing or shortness of breath. 1 Inhaler 2  . ALPRAZolam (XANAX) 0.25 MG tablet Take 1 tablet (0.25 mg total) by mouth every 6 (six) hours as needed. 30 tablet 1  . benzonatate (TESSALON) 100 MG capsule TAKE 1 CAPSULE(100 MG) BY MOUTH THREE TIMES DAILY AS NEEDED FOR COUGH 30 capsule 6  . cefpodoxime (VANTIN) 200 MG tablet Take 1 tablet (200 mg total) by mouth 2 (two) times daily. 10 tablet 0  . cyanocobalamin 500 MCG tablet Take 1,000 mcg by mouth daily.     Marland Kitchen docusate sodium (DOK) 100 MG capsule TAKE 1 CAPSULE(100 MG) BY MOUTH TWICE DAILY 60 capsule 3  . feeding supplement, ENSURE ENLIVE, (ENSURE ENLIVE) LIQD Take 237 mLs by mouth 3 (three) times daily between meals. 237 mL 12  . fluticasone (FLONASE) 50 MCG/ACT nasal spray Place 1 spray into both nostrils daily. (Patient taking differently: Place 2 sprays into both nostrils daily. ) 16 g 6  . furosemide (LASIX) 20 MG tablet Take 1 tablet (20 mg total) by mouth every other day. 15 tablet 0  . lidocaine-prilocaine (EMLA) cream APPLY TOPICALLY AS NEEDED. 30 g 0  . magnesium oxide (MAG-OX) 400 (241.3 Mg) MG tablet Take 1 tablet (400 mg total) by mouth daily. 30 tablet 0  . meclizine (ANTIVERT) 12.5 MG tablet Take 12.5 mg by mouth 3 (three) times daily as needed.    . megestrol (MEGACE) 40 MG/ML suspension Take 10 mLs (400 mg total) by mouth daily. 240 mL 3  . Multiple Vitamin (MULTIVITAMIN WITH MINERALS) TABS tablet Take 1 tablet by mouth daily.    . nitroGLYCERIN (NITROSTAT) 0.4 MG SL tablet Place 0.4 mg under the tongue every 5 (five) minutes x 3 doses as needed for chest pain. Reported on 07/15/2015    . polyethylene glycol powder (GLYCOLAX/MIRALAX) powder Take 17 g by mouth daily as needed. 3350 g 0  . Respiratory Therapy Supplies (FLUTTER) DEVI Use as directed 1 each 0  . SLOW-MAG 71.5-119 MG TBEC SR tablet TAKE 1 TABLET (64 MG TOTAL) BY MOUTH 2 (TWO) TIMES DAILY. 60 tablet 3  . traMADol (ULTRAM) 50 MG tablet Take 1 tablet by mouth every 4-6 hours as needed for pain 90 tablet 0   No current facility-administered medications for this visit.    Facility-Administered Medications Ordered in Other Visits  Medication Dose Route Frequency Provider Last Rate Last Dose  . heparin lock flush 100 unit/mL  500 Units Intravenous Once Forest Gleason, MD      . sodium chloride 0.9 % injection 10 mL  10 mL Intravenous PRN Forest Gleason, MD   10 mL at 07/24/14 1020  . sodium chloride 0.9 % injection 10 mL  10 mL  Intravenous PRN Forest Gleason, MD   10 mL at 08/12/14 0834    PHYSICAL EXAMINATION: ECOG PERFORMANCE STATUS: 2 - Symptomatic, <50% confined to bed  BP (!) 149/72 (BP Location: Left Arm, Patient Position: Sitting)   Pulse (!) 111   Temp 97.2 F (36.2 C) (Tympanic)   Resp 18   Wt 114 lb 3.2 oz (51.8 kg)   SpO2 95%   BMI 20.89 kg/m   Filed Weights   12/07/15 1209  Weight: 114 lb 3.2 oz (51.8 kg)    GENERAL: Frail-appearing Caucasian  female patient; Alert, no distress and comfortable.  Accompanied by her daughter in a wheelchair  EYES: no pallor or icterus OROPHARYNX: no thrush or ulceration; dentures NECK: supple, no masses felt LYMPH:  no palpable lymphadenopathy in the cervical, axillary or inguinal regions LUNGS: Decreased breath sounds on the right side compared to the left  No wheeze or crackles HEART/CVS: regular rate & rhythm and no murmurs; No lower extremity edema ABDOMEN:abdomen soft, non-tender and normal bowel sounds Musculoskeletal:no cyanosis of digits and no clubbing  PSYCH: alert & oriented x 3 with fluent speech NEURO: no focal motor/sensory deficits SKIN:  no rashes or significant lesions  LABORATORY DATA:  I have reviewed the data as listed    Component Value Date/Time   NA 136 11/23/2015 0443   NA 134 (L) 05/15/2014 0855   K 3.6 11/23/2015 0443   K 3.3 (L) 05/15/2014 0855   CL 101 11/23/2015 0443   CL 101 05/15/2014 0855   CO2 26 11/23/2015 0443   CO2 27 05/15/2014 0855   GLUCOSE 205 (H) 11/23/2015 0443   GLUCOSE 213 (H) 05/15/2014 0855   BUN 20 11/23/2015 0443   BUN 9 05/15/2014 0855   CREATININE 0.96 11/23/2015 0443   CREATININE 0.75 05/15/2014 0855   CREATININE 0.82 04/22/2014   CALCIUM 8.7 (L) 11/23/2015 0443   CALCIUM 8.1 (L) 05/15/2014 0855   PROT 6.7 11/13/2015 0900   PROT 5.3 (L) 05/15/2014 0855   ALBUMIN 2.9 (L) 11/13/2015 0900   ALBUMIN 2.7 (L) 05/15/2014 0855   AST 18 11/13/2015 0900   AST 31 05/15/2014 0855   ALT 12 (L)  11/13/2015 0900   ALT 14 05/15/2014 0855   ALKPHOS 87 11/13/2015 0900   ALKPHOS 104 05/15/2014 0855   BILITOT 0.8 11/13/2015 0900   BILITOT 0.6 05/15/2014 0855   GFRNONAA 49 (L) 11/23/2015 0443   GFRNONAA >60 05/15/2014 0855   GFRAA 57 (L) 11/23/2015 0443   GFRAA >60 05/15/2014 0855    No results found for: SPEP, UPEP  Lab Results  Component Value Date   WBC 3.3 (L) 11/22/2015   NEUTROABS 1.2 (L) 11/20/2015   HGB 9.6 (L) 11/22/2015   HCT 28.2 (L) 11/22/2015   MCV 82.1 11/22/2015   PLT 249 11/22/2015      Chemistry      Component Value Date/Time   NA 136 11/23/2015 0443   NA 134 (L) 05/15/2014 0855   K 3.6 11/23/2015 0443   K 3.3 (L) 05/15/2014 0855   CL 101 11/23/2015 0443   CL 101 05/15/2014 0855   CO2 26 11/23/2015 0443   CO2 27 05/15/2014 0855   BUN 20 11/23/2015 0443   BUN 9 05/15/2014 0855   CREATININE 0.96 11/23/2015 0443   CREATININE 0.75 05/15/2014 0855   CREATININE 0.82 04/22/2014      Component Value Date/Time   CALCIUM 8.7 (L) 11/23/2015 0443   CALCIUM 8.1 (L) 05/15/2014 0855   ALKPHOS 87 11/13/2015 0900   ALKPHOS 104 05/15/2014 0855   AST 18 11/13/2015 0900   AST 31 05/15/2014 0855   ALT 12 (L) 11/13/2015 0900   ALT 14 05/15/2014 0855   BILITOT 0.8 11/13/2015 0900   BILITOT 0.6 05/15/2014 0855       RADIOGRAPHIC STUDIES: I have personally reviewed the radiological images as listed and agreed with the findings in the report. No results found.   ASSESSMENT & PLAN:  Follicular lymphoma grade II of intra-abdominal lymph nodes ([place]) # Follicular lymphoma G-2  With recurrence-  and right external iliac lymph node  [May 2017 on PET scan]; however recurrent pleural effusion-possibly related to lymphoma.  Currently on single agent rituximab a monthly basis. Clinical course complicated by multifocal pneumonia. Hold rituximab at this time.  I would favor surveillance unless patient has obvious evidence of progressive lymphoma- given the infectious issues.  Check a PET scan in 2 months.  # Multifocal pneumonia- status post Zyvox; given the improvement in breathing/Pleurx catheter drainage- I suspect patient's symptoms are mostly from pneumonia; less likely from lymphoma.  # The above plan of care was discussed with the patient and daughter in detail.   No orders of the defined types were placed in this encounter.     Cammie Sickle, MD 12/07/2015 1:15 PM

## 2015-12-08 ENCOUNTER — Ambulatory Visit (INDEPENDENT_AMBULATORY_CARE_PROVIDER_SITE_OTHER): Payer: Medicare Other | Admitting: Internal Medicine

## 2015-12-08 ENCOUNTER — Encounter: Payer: Self-pay | Admitting: Internal Medicine

## 2015-12-08 VITALS — BP 142/84 | HR 88 | Wt 113.0 lb

## 2015-12-08 DIAGNOSIS — R0602 Shortness of breath: Secondary | ICD-10-CM

## 2015-12-08 MED ORDER — ALBUTEROL SULFATE HFA 108 (90 BASE) MCG/ACT IN AERS: 1.0000 | INHALATION_SPRAY | RESPIRATORY_TRACT | 2 refills | Status: AC | PRN

## 2015-12-08 NOTE — Patient Instructions (Signed)
Use flutter valve and Incentive spirometry 10-15 times per day advair and albuterol as needed

## 2015-12-08 NOTE — Progress Notes (Signed)
Caroline Russo      Date: 12/08/2015,   MRN# [id number] Caroline Russo 1919-07-02 Code Status:  Code Status History    Date Active Date Inactive Code Status Order ID Comments User Context   07/19/2015  3:49 PM 07/22/2015  3:10 PM DNR [id number]  Caroline Costa, MD ED    Questions for Most Recent Historical Code Status (Order [id number])    Question Answer Comment   In the event of cardiac or respiratory ARREST Do not call a "code blue"    In the event of cardiac or respiratory ARREST Do not perform Intubation, CPR, defibrillation or ACLS    In the event of cardiac or respiratory ARREST Use medication by any route, position, wound care, and other measures to relive pain and suffering. May use oxygen, suction and manual treatment of airway obstruction as needed for comfort.      Hosp day:@LENGTHOFSTAYDAYS @ Referring MD: @ATDPROV @     PCP:      AdmissionWeight: 113 lb (51.3 kg)                 CurrentWeight: 113 lb (51.3 kg) Caroline Russo is a 82 y.o. old female seen in [place] for chronic cough at the request of Dr. Rockey Situ.     CHIEF COMPLAINT:   cough   HISTORY OF PRESENT ILLNESS   82 yo pleasant white female seen today for chronic productive cough for past several months She states she feels better with advair and oral prednisone at 5 mg daily Patient has h/o Follicular lymphoma with RT pleural effusion s/p pleur x catheter placement-drains 200 ml every 10 days Patient undergoing chemo therapy  She is remote smoker 1.5 PPD for several years, quit 1990   Currently,  Patient with 2 admission for pneumonia last month, admitted to [facility] for 7 days Feels much better since admission No signs of infection at this time Appetite is good     Current Medication:  Current Outpatient Prescriptions:  .  albuterol (PROVENTIL HFA;VENTOLIN HFA) 108 (90 Base) MCG/ACT inhaler, Inhale 1-2 puffs into the lungs every 4 (four) hours as needed  for wheezing or shortness of breath., Disp: 1 Inhaler, Rfl: 2 .  ALPRAZolam (XANAX) 0.25 MG tablet, Take 1 tablet (0.25 mg total) by mouth every 6 (six) hours as needed., Disp: 30 tablet, Rfl: 1 .  benzonatate (TESSALON) 100 MG capsule, TAKE 1 CAPSULE(100 MG) BY MOUTH THREE TIMES DAILY AS NEEDED FOR COUGH, Disp: 30 capsule, Rfl: 6 .  cyanocobalamin 500 MCG tablet, Take 1,000 mcg by mouth daily., Disp: , Rfl:  .  docusate sodium (DOK) 100 MG capsule, TAKE 1 CAPSULE(100 MG) BY MOUTH TWICE DAILY, Disp: 60 capsule, Rfl: 3 .  feeding supplement, ENSURE ENLIVE, (ENSURE ENLIVE) LIQD, Take 237 mLs by mouth 3 (three) times daily between meals., Disp: 237 mL, Rfl: 12 .  fluticasone (FLONASE) 50 MCG/ACT nasal spray, Place 1 spray into both nostrils daily. (Patient taking differently: Place 2 sprays into both nostrils daily. ), Disp: 16 g, Rfl: 6 .  furosemide (LASIX) 20 MG tablet, Take 1 tablet (20 mg total) by mouth every other day., Disp: 15 tablet, Rfl: 0 .  lidocaine-prilocaine (EMLA) cream, APPLY TOPICALLY AS NEEDED., Disp: 30 g, Rfl: 0 .  magnesium oxide (MAG-OX) 400 (241.3 Mg) MG tablet, Take 1 tablet (400 mg total) by mouth daily., Disp: 30 tablet, Rfl: 0 .  meclizine (ANTIVERT) 12.5 MG tablet, Take 12.5 mg by mouth 3 (three) times  daily as needed., Disp: , Rfl:  .  megestrol (MEGACE) 40 MG/ML suspension, Take 10 mLs (400 mg total) by mouth daily., Disp: 240 mL, Rfl: 3 .  Multiple Vitamin (MULTIVITAMIN WITH MINERALS) TABS tablet, Take 1 tablet by mouth daily., Disp: , Rfl:  .  nitroGLYCERIN (NITROSTAT) 0.4 MG SL tablet, Place 0.4 mg under the tongue every 5 (five) minutes x 3 doses as needed for chest pain. Reported on 07/15/2015, Disp: , Rfl:  .  pantoprazole (PROTONIX) 40 MG tablet, Take 40 mg by mouth daily., Disp: , Rfl:  .  polyethylene glycol powder (GLYCOLAX/MIRALAX) powder, Take 17 g by mouth daily as needed., Disp: 3350 g, Rfl: 0 .  Respiratory Therapy Supplies (FLUTTER) DEVI, Use as directed,  Disp: 1 each, Rfl: 0 .  SLOW-MAG 71.5-119 MG TBEC SR tablet, TAKE 1 TABLET (64 MG TOTAL) BY MOUTH 2 (TWO) TIMES DAILY., Disp: 60 tablet, Rfl: 3 .  traMADol (ULTRAM) 50 MG tablet, Take 1 tablet by mouth every 4-6 hours as needed for pain, Disp: 90 tablet, Rfl: 0 No current facility-administered medications for this visit.   Facility-Administered Medications Ordered in Other Visits:  .  heparin lock flush 100 unit/mL, 500 Units, Intravenous, Once, Forest Gleason, MD .  sodium chloride 0.9 % injection 10 mL, 10 mL, Intravenous, PRN, Forest Gleason, MD, 10 mL at 07/24/14 1020 .  sodium chloride 0.9 % injection 10 mL, 10 mL, Intravenous, PRN, Forest Gleason, MD, 10 mL at 08/12/14 0834    ALLERGIES   Clindamycin; Moxifloxacin; Codeine; Hydrocodone; Prednisone; Sulfa antibiotics; Unasyn [ampicillin-sulbactam sodium]; and Penicillins     REVIEW OF SYSTEMS   Review of Systems  Constitutional: Positive for malaise/fatigue. Negative for chills, diaphoresis, fever and weight loss.  HENT: Negative for congestion and hearing loss.   Eyes: Negative for blurred vision and double vision.  Respiratory: Positive for cough and sputum production. Negative for hemoptysis, shortness of breath and wheezing.   Cardiovascular: Negative for chest pain, palpitations, orthopnea and leg swelling.  Gastrointestinal: Negative for abdominal pain, heartburn, nausea and vomiting.  Skin: Negative for rash.  Neurological: Negative for weakness and headaches.  All other systems reviewed and are negative.  BP (!) 142/84 (BP Location: Left Arm, Cuff Size: Normal)   Pulse 88   Wt 113 lb (51.3 kg)   SpO2 96%   BMI 20.67 kg/m     PHYSICAL EXAM  Physical Exam  Constitutional: She is oriented to person, place, and time. No distress.  Eyes: No scleral icterus.  Neck: Neck supple.  Cardiovascular: Normal rate, regular rhythm and normal heart sounds.   No murmur heard. Pulmonary/Chest: Effort normal and breath sounds  normal. No stridor. No respiratory distress. She has no wheezes. She has no rales.  Musculoskeletal: Normal range of motion. She exhibits no edema.  Neurological: She is alert and oriented to person, place, and time. No cranial nerve deficit.  Skin: Skin is warm. She is not diaphoretic.  Psychiatric: She has a normal mood and affect.      ONO and 6MWT WNL    ASSESSMENT/PLAN   82 yo pleasant white female with h/o of Pneumonia now with chronic productive cough likely c/w chronic  Bronchitis in the setting of pleural  effuions with h/o lymphoma  Her resp status US back to baseline, doing well since admission for pneumonia   1. continue  advair 2. Ventolin as needed 3. Continue prednisone 5 mg daily 4. Continue Flutter valve/Incentive spirometry 10-15 times daily  Follow up 3  months     The Patient requires high complexity decision making for assessment and support, frequent evaluation and titration of therapies, application of advanced monitoring technologies and extensive interpretation of multiple databases.   Patient/Family are satisfied with Plan of action and management. All questions answered  Corrin Parker, M.D.  [facility]  Medical Director [place] Director [facility] Cardio-Pulmonary Department

## 2015-12-10 ENCOUNTER — Encounter: Payer: Self-pay | Admitting: Cardiovascular Disease

## 2015-12-10 ENCOUNTER — Ambulatory Visit (INDEPENDENT_AMBULATORY_CARE_PROVIDER_SITE_OTHER): Payer: Medicare Other | Admitting: Cardiovascular Disease

## 2015-12-10 ENCOUNTER — Other Ambulatory Visit: Payer: Self-pay | Admitting: *Deleted

## 2015-12-10 VITALS — BP 126/58 | HR 101 | Ht 62.0 in | Wt 113.5 lb

## 2015-12-10 DIAGNOSIS — E119 Type 2 diabetes mellitus without complications: Secondary | ICD-10-CM | POA: Diagnosis not present

## 2015-12-10 DIAGNOSIS — E7800 Pure hypercholesterolemia, unspecified: Secondary | ICD-10-CM

## 2015-12-10 DIAGNOSIS — J181 Lobar pneumonia, unspecified organism: Secondary | ICD-10-CM | POA: Diagnosis not present

## 2015-12-10 DIAGNOSIS — I441 Atrioventricular block, second degree: Secondary | ICD-10-CM | POA: Diagnosis not present

## 2015-12-10 DIAGNOSIS — I5032 Chronic diastolic (congestive) heart failure: Secondary | ICD-10-CM | POA: Diagnosis not present

## 2015-12-10 DIAGNOSIS — I491 Atrial premature depolarization: Secondary | ICD-10-CM

## 2015-12-10 DIAGNOSIS — J90 Pleural effusion, not elsewhere classified: Secondary | ICD-10-CM

## 2015-12-10 DIAGNOSIS — I10 Essential (primary) hypertension: Secondary | ICD-10-CM | POA: Diagnosis not present

## 2015-12-10 DIAGNOSIS — R0602 Shortness of breath: Secondary | ICD-10-CM

## 2015-12-10 DIAGNOSIS — I110 Hypertensive heart disease with heart failure: Secondary | ICD-10-CM | POA: Diagnosis not present

## 2015-12-10 DIAGNOSIS — J690 Pneumonitis due to inhalation of food and vomit: Secondary | ICD-10-CM

## 2015-12-10 DIAGNOSIS — J42 Unspecified chronic bronchitis: Secondary | ICD-10-CM

## 2015-12-10 DIAGNOSIS — C8213 Follicular lymphoma grade II, intra-abdominal lymph nodes: Secondary | ICD-10-CM | POA: Diagnosis not present

## 2015-12-10 MED ORDER — IPRATROPIUM-ALBUTEROL 0.5-2.5 (3) MG/3ML IN SOLN: 3.0000 mL | RESPIRATORY_TRACT | 2 refills | Status: DC | PRN

## 2015-12-10 MED ORDER — FORMOTEROL FUMARATE 20 MCG/2ML IN NEBU: 20.0000 ug | INHALATION_SOLUTION | Freq: Two times a day (BID) | RESPIRATORY_TRACT | 6 refills | Status: DC

## 2015-12-10 MED ORDER — BUDESONIDE 0.5 MG/2ML IN SUSP: 0.5000 mg | Freq: Two times a day (BID) | RESPIRATORY_TRACT | 6 refills | Status: DC

## 2015-12-10 NOTE — Progress Notes (Signed)
Cardiology Office Note  Date:  12/10/2015   ID:  Caroline Russo, Caroline Russo 01-05-1920, MRN [id number]  PCP:  Dicky Doe, MD   Chief Complaint  Patient presents with  . other    Follow up from [place]; pneumonia. Pt. c/o shortness of breath. Meds reviewed by the pt's daughter.     HPI:  Caroline Russo is a pleasant 82 year old woman with hypertension, nonobstructive coronary artery disease in 2002, diabetes, obesity who presents for routine followup of her coronary artery disease, hypertension   On her last clinic visit we started her on Levaquin for upper respiratory infection Symptoms did not improve and she presented to the hospital She had 2 hospitalizations 1 at [facility] for 5 days, Readmitted to [facility] 7 days Hospital records reviewed in detail Treated with various antibiotics, Since her discharge has been seen by pulmonary, Dr. Mortimer Fries. It was suspected she had such a prolonged recovery as she is on Rituxan every 3 months  Family with her today, reports that she continues to have thick productive cough,  Difficulty with Advair, holding her breath in No drainage from pleurex cath past few weeks  Recent CXR with right pleural effusion Family has noted elevated heart rate at home  EKG on today's visit shows normal sinus rhythm with rate 101 bpm, APCs in a bigeminal pattern, Nonspecific ST abnormality   Results from recent hospitalizations reviewed in detail ECHO:  At [facility]  11/26/2015  Left ventricular hypertrophy - mild  Normal left ventricular systolic function, ejection fraction > 55%  Mitral annular calcification  Degenerative mitral valve disease  Mitral stenosis - moderate  Dilated left atrium - moderate  Normal right ventricular systolic function  Other past medical history reviewed Chronic dizziness,  with previous Blood pressure measurements showing orthostasis.   HCTZ was held in the past, losartan dose was decreased.   Previous Holter monitor showing APCs,  PVCs, rare short runs of atrial tachycardia  fall while in the bathroom 06/09/2014, hit her right eye History ofrecurrent pleural effusion, with Pleurx catheter.  Weight continues to trend lower, previously 158 pounds, down to 150 pounds,  130 pounds, now 122 pounds  Previously treated with Rituxan, then Treanda and Rituxan with steroids  In the hospital May 2015 CT scan showed moderate right pleural effusion, diffuse adenopathy CT scan of the head without metastases Hospital records from 11/11/2013 detail diagnosis of follicular lymphoma with chronic right-sided pleural effusion She was discharged from the hospital October 20, admission on October 17  Chronic problem with her vision. Hearing problem  She has macular degeneration.   Previous history of palpitations and event monitor was performed that showed occasional sinus bradycardia., Normal sinus rhythm mostly with frequent APCs. No other significant arrhythmia  PMH:   has a past medical history of Anxiety; Cataract; Chronic chest pain; Decreased appetite; Depression; DM2 (diabetes mellitus, type 2) ([place]); Heart murmur; HTN (hypertension); Hypercholesterolemia; Hypomagnesemia (06/13/2014); Irregular cardiac rhythm; Macular degeneration; Murmur; Non Hodgkin's lymphoma ([place]); Non-obstructive CAD; Orthostatic hypotension; Palpitations; and Tinnitus.  PSH:    Past Surgical History:  Procedure Laterality Date  . ANKLE SURGERY Left   . CARDIAC CATHETERIZATION  2002  . INSERTION / PLACEMENT PLEURAL CATHETER    . KNEE SURGERY Left   . PARTIAL HYSTERECTOMY    . TOOTH EXTRACTION    . TUMOR REMOVAL  2012   benign neck tumor removed    Current Outpatient Prescriptions  Medication Sig Dispense Refill  . albuterol (PROVENTIL HFA;VENTOLIN HFA) 108 (  90 Base) MCG/ACT inhaler Inhale 1-2 puffs into the lungs every 4 (four) hours as needed for wheezing or shortness of breath. 1 Inhaler 2  . ALPRAZolam (XANAX) 0.25 MG tablet Take 1 tablet  (0.25 mg total) by mouth every 6 (six) hours as needed. 30 tablet 1  . benzonatate (TESSALON) 100 MG capsule TAKE 1 CAPSULE(100 MG) BY MOUTH THREE TIMES DAILY AS NEEDED FOR COUGH 30 capsule 6  . cyanocobalamin 500 MCG tablet Take 1,000 mcg by mouth daily.    Marland Kitchen docusate sodium (DOK) 100 MG capsule TAKE 1 CAPSULE(100 MG) BY MOUTH TWICE DAILY 60 capsule 3  . feeding supplement, ENSURE ENLIVE, (ENSURE ENLIVE) LIQD Take 237 mLs by mouth 3 (three) times daily between meals. 237 mL 12  . fluticasone (FLONASE) 50 MCG/ACT nasal spray Place 1 spray into both nostrils daily. (Patient taking differently: Place 2 sprays into both nostrils daily. ) 16 g 6  . furosemide (LASIX) 20 MG tablet Take 1 tablet (20 mg total) by mouth every other day. 15 tablet 0  . lidocaine-prilocaine (EMLA) cream APPLY TOPICALLY AS NEEDED. 30 g 0  . magnesium oxide (MAG-OX) 400 (241.3 Mg) MG tablet Take 1 tablet (400 mg total) by mouth daily. 30 tablet 0  . meclizine (ANTIVERT) 12.5 MG tablet Take 12.5 mg by mouth 3 (three) times daily as needed.    . megestrol (MEGACE) 40 MG/ML suspension Take 10 mLs (400 mg total) by mouth daily. 240 mL 3  . Multiple Vitamin (MULTIVITAMIN WITH MINERALS) TABS tablet Take 1 tablet by mouth daily.    . nitroGLYCERIN (NITROSTAT) 0.4 MG SL tablet Place 0.4 mg under the tongue every 5 (five) minutes x 3 doses as needed for chest pain. Reported on 07/15/2015    . pantoprazole (PROTONIX) 40 MG tablet Take 40 mg by mouth daily.    . polyethylene glycol powder (GLYCOLAX/MIRALAX) powder Take 17 g by mouth daily as needed. 3350 g 0  . Respiratory Therapy Supplies (FLUTTER) DEVI Use as directed 1 each 0  . SLOW-MAG 71.5-119 MG TBEC SR tablet TAKE 1 TABLET (64 MG TOTAL) BY MOUTH 2 (TWO) TIMES DAILY. 60 tablet 3  . traMADol (ULTRAM) 50 MG tablet Take 1 tablet by mouth every 4-6 hours as needed for pain 90 tablet 0  . budesonide (PULMICORT) 0.5 MG/2ML nebulizer solution Take 2 mLs (0.5 mg total) by nebulization 2  (two) times daily. DX: Chronic bronchitis DX Code: J42 120 mL 6  . formoterol (PERFOROMIST) 20 MCG/2ML nebulizer solution Take 2 mLs (20 mcg total) by nebulization 2 (two) times daily. DX: chronic bronchitis DX Code: J42 120 mL 6  . ipratropium-albuterol (DUONEB) 0.5-2.5 (3) MG/3ML SOLN Take 3 mLs by nebulization every 4 (four) hours as needed. DX: chronic bronchitis DX Code: J42 360 mL 2   No current facility-administered medications for this visit.    Facility-Administered Medications Ordered in Other Visits  Medication Dose Route Frequency Provider Last Rate Last Dose  . heparin lock flush 100 unit/mL  500 Units Intravenous Once Forest Gleason, MD      . sodium chloride 0.9 % injection 10 mL  10 mL Intravenous PRN Forest Gleason, MD   10 mL at 07/24/14 1020  . sodium chloride 0.9 % injection 10 mL  10 mL Intravenous PRN Forest Gleason, MD   10 mL at 08/12/14 0834     Allergies:   Clindamycin; Moxifloxacin; Codeine; Hydrocodone; Prednisone; Sulfa antibiotics; Unasyn [ampicillin-sulbactam sodium]; and Penicillins   Social History:  The patient  reports that she quit smoking about 27 years ago. Her smoking use included Cigarettes. She has a 1.75 pack-year smoking history. She has never used smokeless tobacco. She reports that she drinks alcohol. She reports that she does not use drugs.   Family History:   family history includes Bladder Cancer in her brother; Heart disease in her mother; Lung cancer in her brother; Stomach cancer in her brother.    Review of Systems: Review of Systems  Respiratory: Positive for cough, sputum production and shortness of breath.   Cardiovascular: Negative.   Gastrointestinal: Negative.   Musculoskeletal: Negative.        Gait instability  Neurological: Positive for weakness.  Psychiatric/Behavioral: Negative.   All other systems reviewed and are negative.    PHYSICAL EXAM: VS:  BP (!) 126/58 (BP Location: Left Arm, Patient Position: Sitting, Cuff Size:  Normal)   Pulse (!) 101   Ht 5\' 2"  (1.575 m)   Wt 113 lb 8 oz (51.5 kg)   BMI 20.76 kg/m  , BMI Body mass index is 20.76 kg/m. GEN: Well nourished, well developed, in no acute distress, significant upper airway sounding cough, presenting a wheelchair HEENT: normal  Neck: no JVD, carotid bruits, or masses Cardiac: RRR; no murmurs, rubs, or gallops,no edema  Respiratory:  Scattered Rales,  normal work of breathing GI: soft, nontender, nondistended, + BS MS: no deformity or atrophy  Skin: warm and dry, no rash Neuro:  Strength and sensation are intact Psych: euthymic mood, full affect    Recent Labs: 07/19/2015: TSH 0.747 11/13/2015: ALT 12 11/22/2015: Hemoglobin 9.6; Platelets 249 11/23/2015: BUN 20; Creatinine, Ser 0.96; Magnesium 1.3; Potassium 3.6; Sodium 136    Lipid Panel No results found for: CHOL, HDL, LDLCALC, TRIG    Wt Readings from Last 3 Encounters:  12/10/15 113 lb 8 oz (51.5 kg)  12/08/15 113 lb (51.3 kg)  12/07/15 114 lb 3.2 oz (51.8 kg)       ASSESSMENT AND PLAN:  Diastolic CHF, chronic (HCC) - Plan: EKG 12-Lead Likely euvolemic, no leg edema,  No medication changes made  Essential hypertension, benign - Plan: EKG 12-Lead Blood pressure is well controlled on today's visit. No changes made to the medications.  Pneumonia Etiology unclear, very long hospital course requiring 2 hospitalizations Likely complicated in the setting of lymphoma, on Rituxan,  Made worse by her age Case discussed with Dr. 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   Total encounter time more than 40 minutes   Greater than 50% was spent in counseling and coordination of care with the patient    Disposition:   F/U  6 months   Orders Placed This Encounter  Procedures  . EKG 12-Lead     Signed, Esmond Plants, M.D., Ph.D. 12/10/2015  [place], [place]

## 2015-12-10 NOTE — Telephone Encounter (Signed)
LMOVM for pt to return call to see if pt has neb machine due to DK wanting to place pt on neb treatments.

## 2015-12-10 NOTE — Patient Instructions (Signed)
Medication Instructions:   No medication changes made  I will ask Dr. Mortimer Fries about nebs  Low threshold to use oxygen  Labwork:  No new labs needed  Testing/Procedures:  No further testing at this time   I recommend watching educational videos on topics of interest to you at:       [email or web address]  Enter code: [email or web address]    Follow-Up: It was a pleasure seeing you in the office today. Please call us if you have new issues that need to be addressed before your next appt.  [phone]  Your physician wants you to follow-up in: 6 months.  You will receive a reminder letter in the mail two months in advance. If you don't receive a letter, please call our office to schedule the follow-up appointment.  If you need a refill on your cardiac medications before your next appointment, please call your pharmacy.

## 2015-12-10 NOTE — Telephone Encounter (Signed)
Spoke with daughter and informed her of the chagnes. I will ask DK if he wants pt to cont the Advair and call the daughter back. Will place order for nebulizer machine. Per DK pt is to stop Advair. Pt's daughter informed.

## 2015-12-11 DIAGNOSIS — I5032 Chronic diastolic (congestive) heart failure: Secondary | ICD-10-CM | POA: Diagnosis not present

## 2015-12-11 DIAGNOSIS — I110 Hypertensive heart disease with heart failure: Secondary | ICD-10-CM | POA: Diagnosis not present

## 2015-12-11 DIAGNOSIS — C8213 Follicular lymphoma grade II, intra-abdominal lymph nodes: Secondary | ICD-10-CM | POA: Diagnosis not present

## 2015-12-11 DIAGNOSIS — E119 Type 2 diabetes mellitus without complications: Secondary | ICD-10-CM | POA: Diagnosis not present

## 2015-12-11 DIAGNOSIS — J181 Lobar pneumonia, unspecified organism: Secondary | ICD-10-CM | POA: Diagnosis not present

## 2015-12-11 DIAGNOSIS — I441 Atrioventricular block, second degree: Secondary | ICD-10-CM | POA: Diagnosis not present

## 2015-12-14 DIAGNOSIS — I441 Atrioventricular block, second degree: Secondary | ICD-10-CM | POA: Diagnosis not present

## 2015-12-14 DIAGNOSIS — J181 Lobar pneumonia, unspecified organism: Secondary | ICD-10-CM | POA: Diagnosis not present

## 2015-12-14 DIAGNOSIS — I110 Hypertensive heart disease with heart failure: Secondary | ICD-10-CM | POA: Diagnosis not present

## 2015-12-14 DIAGNOSIS — E119 Type 2 diabetes mellitus without complications: Secondary | ICD-10-CM | POA: Diagnosis not present

## 2015-12-14 DIAGNOSIS — C8213 Follicular lymphoma grade II, intra-abdominal lymph nodes: Secondary | ICD-10-CM | POA: Diagnosis not present

## 2015-12-14 DIAGNOSIS — I5032 Chronic diastolic (congestive) heart failure: Secondary | ICD-10-CM | POA: Diagnosis not present

## 2015-12-15 ENCOUNTER — Other Ambulatory Visit: Payer: Self-pay | Admitting: Internal Medicine

## 2015-12-15 ENCOUNTER — Ambulatory Visit: Payer: Medicare Other | Admitting: Family

## 2015-12-15 ENCOUNTER — Encounter: Payer: Self-pay | Admitting: Family

## 2015-12-15 ENCOUNTER — Telehealth: Payer: Self-pay | Admitting: Family

## 2015-12-15 DIAGNOSIS — I110 Hypertensive heart disease with heart failure: Secondary | ICD-10-CM | POA: Diagnosis not present

## 2015-12-15 DIAGNOSIS — I5032 Chronic diastolic (congestive) heart failure: Secondary | ICD-10-CM | POA: Diagnosis not present

## 2015-12-15 DIAGNOSIS — J181 Lobar pneumonia, unspecified organism: Secondary | ICD-10-CM | POA: Diagnosis not present

## 2015-12-15 DIAGNOSIS — I441 Atrioventricular block, second degree: Secondary | ICD-10-CM | POA: Diagnosis not present

## 2015-12-15 DIAGNOSIS — C8213 Follicular lymphoma grade II, intra-abdominal lymph nodes: Secondary | ICD-10-CM | POA: Diagnosis not present

## 2015-12-15 DIAGNOSIS — E119 Type 2 diabetes mellitus without complications: Secondary | ICD-10-CM | POA: Diagnosis not present

## 2015-12-15 NOTE — Telephone Encounter (Signed)
Patient missed her initial appointment at the [facility] on 12/15/15. Will attempt to reschedule.

## 2015-12-16 ENCOUNTER — Ambulatory Visit: Payer: Medicare Other | Admitting: Family

## 2015-12-16 DIAGNOSIS — J181 Lobar pneumonia, unspecified organism: Secondary | ICD-10-CM | POA: Diagnosis not present

## 2015-12-16 DIAGNOSIS — C8213 Follicular lymphoma grade II, intra-abdominal lymph nodes: Secondary | ICD-10-CM | POA: Diagnosis not present

## 2015-12-16 DIAGNOSIS — I110 Hypertensive heart disease with heart failure: Secondary | ICD-10-CM | POA: Diagnosis not present

## 2015-12-16 DIAGNOSIS — I5032 Chronic diastolic (congestive) heart failure: Secondary | ICD-10-CM | POA: Diagnosis not present

## 2015-12-16 DIAGNOSIS — E119 Type 2 diabetes mellitus without complications: Secondary | ICD-10-CM | POA: Diagnosis not present

## 2015-12-16 DIAGNOSIS — I441 Atrioventricular block, second degree: Secondary | ICD-10-CM | POA: Diagnosis not present

## 2015-12-21 ENCOUNTER — Other Ambulatory Visit: Payer: Self-pay | Admitting: *Deleted

## 2015-12-21 ENCOUNTER — Other Ambulatory Visit: Payer: Self-pay | Admitting: Internal Medicine

## 2015-12-21 DIAGNOSIS — C8213 Follicular lymphoma grade II, intra-abdominal lymph nodes: Secondary | ICD-10-CM | POA: Diagnosis not present

## 2015-12-21 DIAGNOSIS — I441 Atrioventricular block, second degree: Secondary | ICD-10-CM | POA: Diagnosis not present

## 2015-12-21 DIAGNOSIS — I5032 Chronic diastolic (congestive) heart failure: Secondary | ICD-10-CM | POA: Diagnosis not present

## 2015-12-21 DIAGNOSIS — E119 Type 2 diabetes mellitus without complications: Secondary | ICD-10-CM | POA: Diagnosis not present

## 2015-12-21 DIAGNOSIS — I110 Hypertensive heart disease with heart failure: Secondary | ICD-10-CM | POA: Diagnosis not present

## 2015-12-21 DIAGNOSIS — J181 Lobar pneumonia, unspecified organism: Secondary | ICD-10-CM | POA: Diagnosis not present

## 2015-12-21 MED ORDER — BUDESONIDE 0.5 MG/2ML IN SUSP
0.5000 mg | Freq: Two times a day (BID) | RESPIRATORY_TRACT | 6 refills | Status: DC
Start: 2015-12-21 — End: 2015-12-21

## 2015-12-22 ENCOUNTER — Telehealth: Payer: Self-pay | Admitting: *Deleted

## 2015-12-22 ENCOUNTER — Telehealth: Payer: Self-pay | Admitting: Cardiovascular Disease

## 2015-12-22 NOTE — Telephone Encounter (Signed)
Call placed to [facility], left message regarding [place] response

## 2015-12-22 NOTE — Telephone Encounter (Signed)
ERROR

## 2015-12-22 NOTE — Telephone Encounter (Signed)
Team is Sending an RX for additional dressing supplies for pleurx cath to [facility].  Daughter will still need to contact [facility] to confirm shipment of supplies once order is sent.

## 2015-12-22 NOTE — Telephone Encounter (Signed)
Home Health cannot get a drsg only kit and Caroline Russo told her to contact her if they were unable to get supplies needed. Please advise where they can get the dressing only kit for the Pleurex cath. She is not really draining much form cath any longer

## 2015-12-23 DIAGNOSIS — C8213 Follicular lymphoma grade II, intra-abdominal lymph nodes: Secondary | ICD-10-CM | POA: Diagnosis not present

## 2015-12-23 DIAGNOSIS — I441 Atrioventricular block, second degree: Secondary | ICD-10-CM | POA: Diagnosis not present

## 2015-12-23 DIAGNOSIS — I5032 Chronic diastolic (congestive) heart failure: Secondary | ICD-10-CM | POA: Diagnosis not present

## 2015-12-23 DIAGNOSIS — I110 Hypertensive heart disease with heart failure: Secondary | ICD-10-CM | POA: Diagnosis not present

## 2015-12-23 DIAGNOSIS — E119 Type 2 diabetes mellitus without complications: Secondary | ICD-10-CM | POA: Diagnosis not present

## 2015-12-23 DIAGNOSIS — J181 Lobar pneumonia, unspecified organism: Secondary | ICD-10-CM | POA: Diagnosis not present

## 2015-12-24 ENCOUNTER — Other Ambulatory Visit: Payer: Medicare Other

## 2015-12-25 ENCOUNTER — Ambulatory Visit
Admission: RE | Admit: 2015-12-25 | Discharge: 2015-12-25 | Disposition: A | Discharge status: Home / Self Care | Payer: Medicare Other | Source: Ambulatory Visit | Attending: Family Medicine | Admitting: Family Medicine

## 2015-12-25 ENCOUNTER — Encounter: Payer: Self-pay | Admitting: Family Medicine

## 2015-12-25 ENCOUNTER — Ambulatory Visit (INDEPENDENT_AMBULATORY_CARE_PROVIDER_SITE_OTHER): Payer: Medicare Other | Admitting: Family Medicine

## 2015-12-25 VITALS — BP 143/49 | HR 83 | Temp 98.4°F | Resp 16 | Ht 62.0 in | Wt 118.0 lb

## 2015-12-25 DIAGNOSIS — J90 Pleural effusion, not elsewhere classified: Secondary | ICD-10-CM

## 2015-12-25 DIAGNOSIS — C8213 Follicular lymphoma grade II, intra-abdominal lymph nodes: Secondary | ICD-10-CM

## 2015-12-25 DIAGNOSIS — I5032 Chronic diastolic (congestive) heart failure: Secondary | ICD-10-CM

## 2015-12-25 DIAGNOSIS — K644 Residual hemorrhoidal skin tags: Secondary | ICD-10-CM | POA: Insufficient documentation

## 2015-12-25 DIAGNOSIS — J189 Pneumonia, unspecified organism: Secondary | ICD-10-CM

## 2015-12-25 DIAGNOSIS — J181 Lobar pneumonia, unspecified organism: Principal | ICD-10-CM

## 2015-12-25 MED ORDER — HYDROCORTISONE ACETATE 25 MG RE SUPP: 25.0000 mg | Freq: Two times a day (BID) | RECTAL | 1 refills | Status: DC

## 2015-12-25 MED ORDER — FUROSEMIDE 20 MG PO TABS: 20.0000 mg | ORAL_TABLET | ORAL | 0 refills | Status: DC

## 2015-12-25 NOTE — Progress Notes (Signed)
Subjective:    Patient ID: Caroline Russo, female    DOB: May 27, 1919, 82 y.o.   MRN: [id number]  Caroline Russo is a 82 y.o. female presenting on 12/25/2015 for Pneumonia (follow up ) and Hemorrhoids   HPI History primarily provided by patient's caregiver Vickii Chafe (note daughter and primary caregiver Clide Dales is not present today). Patient able to provide some history as well.  HOSPITAL FOLLOW-UP Multifocial Pneumonia, in setting of Folllicular lymphoma, chronic dCHF - Initial admitted to [facility] for pneumonia on 10/31 discharged on oral antibiotics with worsening, then admitted to [facility] on 11/1 and admitted to Oncology team for treatment of multifocal pneumonia, extensive antibiotic courses including Vanc/Cefepime then transitioned to Linezolid (Zyvox). Clinically thought some disease progression of NHL but not invading lung and unlikely aspiration PNA. Discharged home on 12/02/15 on Bactrim PO as PO linezolid not covered by ins., given O2 PRN desaturation with ambulation, concern per oncology regarding continued RItuxan treatments due to resp infections, follow-up Onc, visit on 11/13, determined to hold Rituxan, favoring surveillance, with repeat PET scan in 2 months. See [facility] Discharge summary for full details. - Followed up with Pulmonology Dr Mortimer Fries on 11/14, was advised to continue prednisone 5mg  daily, however patient not taking this, asked her to contact pulm to clarify - Pleurex catheter remains in place, with reduced output, patient was advised that it needs to stay for few months before removal - Today feeling much better overall after hospitalization. No significant productive cough. Resolved fevers/chills. Energy improved. No new concerns regarding hospitalization. - Denies fevers/chills, worsening dyspnea, productive cough, unintentional weight loss, she has gained 5 lbs since last visit  HEMORRHOIDS, external - Reports recent problem over past 1-2 weeks with external inflamed  hemorrhoids, without bleeding. Does get pain and worse with sitting or BMs, no significant constipation or straining. Tried witch hazel, preparation H topical, and just started Sitz bath yesterday with improvement now today - Requesting other treatment today, used suppository in past with relief  Social History  Substance Use Topics  . Smoking status: Former Smoker    Packs/day: 0.25    Years: 7.00    Types: Cigarettes    Quit date: 01/25/1988  . Smokeless tobacco: Never Used  . Alcohol use Yes     Comment: drinks beer    Review of Systems Per HPI unless specifically indicated above     Objective:    BP (!) 143/49   Pulse 83   Temp 98.4 F (36.9 C) (Oral)   Resp 16   Ht 5\' 2"  (1.575 m)   Wt 118 lb (53.5 kg)   BMI 21.58 kg/m   Wt Readings from Last 3 Encounters:  12/25/15 118 lb (53.5 kg)  12/10/15 113 lb 8 oz (51.5 kg)  12/08/15 113 lb (51.3 kg)    Physical Exam  Constitutional: She appears well-developed and well-nourished. No distress.  Chronically ill, well appearing 25 yr elderly female, comfortable and cooperative  HENT:  Head: Normocephalic and atraumatic.  Still slightly dry mucus mem  Eyes: Conjunctivae are normal.  Neck: Normal range of motion. Neck supple. No thyromegaly present.  Cardiovascular: Normal rate, regular rhythm, normal heart sounds and intact distal pulses.   No murmur heard. Pulmonary/Chest: Effort normal. No respiratory distress. She has no wheezes. She has no rales (Significantly improved rales, now mild bibasilar coarse sounds only, seem stable).  Improved now with preserved air movement, reduced air movement bilateral bases also improved from before.  Genitourinary:  Genitourinary Comments: Deferred  rectal exam for hemorrhoid evaluation by patient preference.  Musculoskeletal: She exhibits no edema.  Lymphadenopathy:    She has no cervical adenopathy.  Neurological: She is alert.  Skin: Skin is warm and dry. She is not diaphoretic.    Psychiatric: Her behavior is normal.  Nursing note and vitals reviewed.    I have personally reviewed the radiology report from Chest X-ray 2 view on 12/25/15. CLINICAL DATA:  History of pneumonia, followup examination  EXAM: CHEST  2 VIEW  COMPARISON:  11/24/2015  FINDINGS: Cardiac shadow is stable. Aortic calcifications are again seen. Right chest port is again seen and stable. Minimal residual thickening is noted along the minor fissure related to the previous abnormality. A somewhat nodular density is identified however likely residual from the previous infiltrate. Chronic interstitial changes are noted. Chronic blunting of the costophrenic angles is noted. A PleurX catheter remains on the right.  IMPRESSION: Near complete resolution of previously seen infiltrate. Some nodular component is noted likely a sequela from prior infiltrate. Continued follow-up is recommended.   Electronically Signed   By: Inez Catalina M.D.   On: 12/25/2015 15:30     I have personally reviewed the following lab results from 11/13/15.  Results for orders placed or performed during the hospital encounter of 11/20/15  Blood culture (routine x 2)  Result Value Ref Range   Specimen Description BLOOD RIGHT ARM    Special Requests AER 8CC ANA 8CC    Culture NO GROWTH 5 DAYS    Report Status 11/25/2015 FINAL   Blood culture (routine x 2)  Result Value Ref Range   Specimen Description BLOOD LEFT ARM    Special Requests AER 8CC ANA 8CC    Culture NO GROWTH 5 DAYS    Report Status 11/25/2015 FINAL   MRSA PCR Screening  Result Value Ref Range   MRSA by PCR NEGATIVE NEGATIVE  Culture, expectorated sputum-assessment  Result Value Ref Range   Specimen Description SPU    Special Requests Immunocompromised    Sputum evaluation THIS SPECIMEN IS ACCEPTABLE FOR SPUTUM CULTURE    Report Status 11/21/2015 FINAL   Culture, respiratory (NON-Expectorated)  Result Value Ref Range   Specimen  Description SPU    Special Requests Immunocompromised Reflexed from [id number]    Gram Stain      MODERATE WBC PRESENT, PREDOMINANTLY PMN RARE SQUAMOUS EPITHELIAL CELLS PRESENT ABUNDANT GRAM POSITIVE RODS RARE BUDDING YEAST SEEN RARE GRAM VARIABLE ROD    Culture      Consistent with normal respiratory flora. Performed at [facility]    Report Status 11/24/2015 FINAL   CBC with Differential  Result Value Ref Range   WBC 3.5 (L) 3.6 - 11.0 K/uL   RBC 4.64 3.80 - 5.20 MIL/uL   Hemoglobin 12.5 12.0 - 16.0 g/dL   HCT 39.1 35.0 - 47.0 %   MCV 84.3 80.0 - 100.0 fL   MCH 27.0 26.0 - 34.0 pg   MCHC 32.0 32.0 - 36.0 g/dL   RDW 16.3 (H) 11.5 - 14.5 %   Platelets 355 150 - 440 K/uL   Neutrophils Relative % 25 %   Lymphocytes Relative 38 %   Monocytes Relative 24 %   Eosinophils Relative 2 %   Basophils Relative 2 %   Band Neutrophils 5 %   Metamyelocytes Relative 3 %   Myelocytes 1 %   Promyelocytes Absolute 0 %   Blasts 0 %   nRBC 0 0 /100 WBC   Other 0 %  Neutro Abs 1.2 (L) 1.4 - 6.5 K/uL   Lymphs Abs 1.3 1.0 - 3.6 K/uL   Monocytes Absolute 0.8 0.2 - 0.9 K/uL   Eosinophils Absolute 0.1 0 - 0.7 K/uL   Basophils Absolute 0.1 0 - 0.1 K/uL   RBC Morphology MIXED RBC POPULATION    Smear Review LARGE PLATELETS PRESENT   Basic metabolic panel  Result Value Ref Range   Sodium 133 (L) 135 - 145 mmol/L   Potassium 4.5 3.5 - 5.1 mmol/L   Chloride 101 101 - 111 mmol/L   CO2 20 (L) 22 - 32 mmol/L   Glucose, Bld 211 (H) 65 - 99 mg/dL   BUN 17 6 - 20 mg/dL   Creatinine, Ser 0.88 0.44 - 1.00 mg/dL   Calcium 8.9 8.9 - 10.3 mg/dL   GFR calc non Af Amer 55 (L) >60 mL/min   GFR calc Af Amer >60 >60 mL/min   Anion gap 12 5 - 15  Urinalysis complete, with microscopic ([facility] only)  Result Value Ref Range   Color, Urine YELLOW (A) YELLOW   APPearance CLEAR (A) CLEAR   Glucose, UA NEGATIVE NEGATIVE mg/dL   Bilirubin Urine NEGATIVE NEGATIVE   Ketones, ur TRACE (A) NEGATIVE mg/dL    Specific Gravity, Urine 1.017 1.005 - 1.030   Hgb urine dipstick NEGATIVE NEGATIVE   pH 5.0 5.0 - 8.0   Protein, ur 30 (A) NEGATIVE mg/dL   Nitrite NEGATIVE NEGATIVE   Leukocytes, UA NEGATIVE NEGATIVE   RBC / HPF NONE SEEN 0 - 5 RBC/hpf   WBC, UA 0-5 0 - 5 WBC/hpf   Bacteria, UA NONE SEEN NONE SEEN   Squamous Epithelial / LPF NONE SEEN NONE SEEN   Mucous PRESENT    Granular Casts, UA PRESENT   Basic metabolic panel  Result Value Ref Range   Sodium 133 (L) 135 - 145 mmol/L   Potassium 4.2 3.5 - 5.1 mmol/L   Chloride 104 101 - 111 mmol/L   CO2 21 (L) 22 - 32 mmol/L   Glucose, Bld 255 (H) 65 - 99 mg/dL   BUN 17 6 - 20 mg/dL   Creatinine, Ser 0.88 0.44 - 1.00 mg/dL   Calcium 8.0 (L) 8.9 - 10.3 mg/dL   GFR calc non Af Amer 55 (L) >60 mL/min   GFR calc Af Amer >60 >60 mL/min   Anion gap 8 5 - 15  CBC  Result Value Ref Range   WBC 3.8 3.6 - 11.0 K/uL   RBC 3.72 (L) 3.80 - 5.20 MIL/uL   Hemoglobin 10.3 (L) 12.0 - 16.0 g/dL   HCT 30.6 (L) 35.0 - 47.0 %   MCV 82.4 80.0 - 100.0 fL   MCH 27.7 26.0 - 34.0 pg   MCHC 33.6 32.0 - 36.0 g/dL   RDW 16.2 (H) 11.5 - 14.5 %   Platelets 262 150 - 440 K/uL  CBC  Result Value Ref Range   WBC 3.3 (L) 3.6 - 11.0 K/uL   RBC 3.43 (L) 3.80 - 5.20 MIL/uL   Hemoglobin 9.6 (L) 12.0 - 16.0 g/dL   HCT 28.2 (L) 35.0 - 47.0 %   MCV 82.1 80.0 - 100.0 fL   MCH 27.9 26.0 - 34.0 pg   MCHC 34.0 32.0 - 36.0 g/dL   RDW 16.0 (H) 11.5 - 14.5 %   Platelets 249 150 - 440 K/uL  Basic metabolic panel  Result Value Ref Range   Sodium 136 135 - 145 mmol/L   Potassium 3.6  3.5 - 5.1 mmol/L   Chloride 101 101 - 111 mmol/L   CO2 26 22 - 32 mmol/L   Glucose, Bld 205 (H) 65 - 99 mg/dL   BUN 20 6 - 20 mg/dL   Creatinine, Ser 0.96 0.44 - 1.00 mg/dL   Calcium 8.7 (L) 8.9 - 10.3 mg/dL   GFR calc non Af Amer 49 (L) >60 mL/min   GFR calc Af Amer 57 (L) >60 mL/min   Anion gap 9 5 - 15  Magnesium  Result Value Ref Range   Magnesium 1.3 (L) 1.7 - 2.4 mg/dL  Cryptococcus  Antigen, Serum  Result Value Ref Range   Cryptococcus Antigen, Serum Negative Negative      Assessment & Plan:   Problem List Items Addressed This Visit    RESOLVED: Pneumonia of both lower lobes due to infectious organism - Primary    Resolved. S/p extensive antibiotic course 1 week hospitalization at [facility], completed Linezolid then PO bactrim. - Clinically well today without concerning lung findings - Has recently followed up with Pulm and Onc  Plan: 1. Check CXR today to determine resolution, reviewed results with significant resolution of multifocal pneumonia, caregiver notified of results      Relevant Orders   DG Chest 2 View (Completed)   Pleural effusion on right    Stable to improved, now s/p multifocal pneumonia Check CXR today Follow-up Onc for future removal of pleurex catheter now with limited output      Relevant Medications   furosemide (LASIX) 20 MG tablet   Inflamed external hemorrhoid    Improved with conservative measures, Sitz baths. No bleeding or complication.  Plan: 1. Start rx Anusol-HC hydrocortisone 25mg  suppository BID for 7 days, given 1 refill if need repeat course 2. Start Sitz Baths or warm bathtub soaks to help resolve flare 3. Avoid constipation and straining, recommend high fiber diet, improve hydration 4. Reviewed return criteria if not improving, also advised that if significant worsening given location and size of hemorrhoidal tissue, may need General Surgery evaluation and management       Relevant Medications   hydrocortisone (ANUSOL-HC) 25 MG suppository   furosemide (LASIX) 20 MG tablet   Follicular lymphoma grade II of intra-abdominal lymph nodes (HCC) - Follow-up Oncology, plan currently by chart review is hold Rituxan now s/p infection, re-check PET in 2 months   Diastolic CHF, chronic (HCC)    Resolved acute exac in hospital related to pneumonia Advised to resume Lasix 20mg  QOD for 1 month, sent refill, but will defer this  further management to Cardiology, asked them to contact Dr Rockey Situ her Cardiologist, just saw recently 2 weeks ago      Relevant Medications   furosemide (LASIX) 20 MG tablet      Meds ordered this encounter  Medications  . hydrocortisone (ANUSOL-HC) 25 MG suppository    Sig: Place 1 suppository (25 mg total) rectally 2 (two) times daily. For 7 days, can repeat course if needed    Dispense:  14 suppository    Refill:  1  . furosemide (LASIX) 20 MG tablet    Sig: Take 1 tablet (20 mg total) by mouth every other day. For 1 month    Dispense:  15 tablet    Refill:  0    Follow-up with Cardiology for any further refills      Follow up plan: Return in about 1 week (around 01/01/2016), or if symptoms worsen or fail to improve, for hemorrhoids.  Nobie Putnam, DO  [facility] 12/26/2015, 10:24 AM

## 2015-12-25 NOTE — Patient Instructions (Signed)
Thank you for coming in to clinic today.  CHest X-ray today for baseline comparison.  Call Pulmonology Dr Mortimer Fries - to ask about if she is to continue Prednisone 5mg  daily or not.  You have an inflamed external hemorrhoid, which involves swollen veins on your rectum, it is very sensitive and causes your severe pain. You may experience worsening pain and bleeding with bright red blood if the hemorrhoid develops a superficial blood clot. Also you may have deeper internal hemorrhoids that can cause bleeding without as much pain. - Start using the Anusol suppository twice a day as prescribed for 1 week, given a refill if needed for flare up - continue the warm bathtub soak 1-2 times daily for next week if you can, or can try the [facility] for just your bottom - Try to stay well hydrated, avoid constipation and straining, eat a high fiber diet  If you get significant worsening pain, rectal bleeding, or not responding to treatment, please notify our office and we will anticipate on an urgent referral to General Surgery office (usually [facility]) as you may need an office procedure to resolve hemorrhoidal tissue, this is most successful if it is early in the course within 24-72 hours of acute worsening pain and bleeding.  Please schedule a follow-up appointment with Dr. Parks Ranger in 3 to 6 months for follow-up as needed, sooner if worsening hemorrhoids  If you have any other questions or concerns, please feel free to call the clinic or send a message through [place]. You may also schedule an earlier appointment if necessary.  Nobie Putnam, DO [place]

## 2015-12-26 NOTE — Assessment & Plan Note (Signed)
Resolved. S/p extensive antibiotic course 1 week hospitalization at [facility], completed Linezolid then PO bactrim. - Clinically well today without concerning lung findings - Has recently followed up with Pulm and Onc  Plan: 1. Check CXR today to determine resolution, reviewed results with significant resolution of multifocal pneumonia, caregiver notified of results

## 2015-12-26 NOTE — Assessment & Plan Note (Signed)
Resolved acute exac in hospital related to pneumonia Advised to resume Lasix 20mg  QOD for 1 month, sent refill, but will defer this further management to Cardiology, asked them to contact Dr Rockey Situ her Cardiologist, just saw recently 2 weeks ago

## 2015-12-26 NOTE — Assessment & Plan Note (Signed)
Stable to improved, now s/p multifocal pneumonia Check CXR today Follow-up Onc for future removal of pleurex catheter now with limited output

## 2015-12-26 NOTE — Assessment & Plan Note (Addendum)
Improved with conservative measures, Sitz baths. No bleeding or complication.  Plan: 1. Start rx Anusol-HC hydrocortisone 25mg  suppository BID for 7 days, given 1 refill if need repeat course 2. Start Sitz Baths or warm bathtub soaks to help resolve flare 3. Avoid constipation and straining, recommend high fiber diet, improve hydration 4. Reviewed return criteria if not improving, also advised that if significant worsening given location and size of hemorrhoidal tissue, may need General Surgery evaluation and management

## 2015-12-30 DIAGNOSIS — J181 Lobar pneumonia, unspecified organism: Secondary | ICD-10-CM | POA: Diagnosis not present

## 2015-12-30 DIAGNOSIS — I5032 Chronic diastolic (congestive) heart failure: Secondary | ICD-10-CM | POA: Diagnosis not present

## 2015-12-30 DIAGNOSIS — C8213 Follicular lymphoma grade II, intra-abdominal lymph nodes: Secondary | ICD-10-CM | POA: Diagnosis not present

## 2015-12-30 DIAGNOSIS — I441 Atrioventricular block, second degree: Secondary | ICD-10-CM | POA: Diagnosis not present

## 2015-12-30 DIAGNOSIS — E119 Type 2 diabetes mellitus without complications: Secondary | ICD-10-CM | POA: Diagnosis not present

## 2015-12-30 DIAGNOSIS — I110 Hypertensive heart disease with heart failure: Secondary | ICD-10-CM | POA: Diagnosis not present

## 2015-12-31 DIAGNOSIS — I5032 Chronic diastolic (congestive) heart failure: Secondary | ICD-10-CM | POA: Diagnosis not present

## 2015-12-31 DIAGNOSIS — J181 Lobar pneumonia, unspecified organism: Secondary | ICD-10-CM | POA: Diagnosis not present

## 2015-12-31 DIAGNOSIS — C8213 Follicular lymphoma grade II, intra-abdominal lymph nodes: Secondary | ICD-10-CM | POA: Diagnosis not present

## 2015-12-31 DIAGNOSIS — E119 Type 2 diabetes mellitus without complications: Secondary | ICD-10-CM | POA: Diagnosis not present

## 2015-12-31 DIAGNOSIS — I441 Atrioventricular block, second degree: Secondary | ICD-10-CM | POA: Diagnosis not present

## 2015-12-31 DIAGNOSIS — I110 Hypertensive heart disease with heart failure: Secondary | ICD-10-CM | POA: Diagnosis not present

## 2016-01-05 ENCOUNTER — Other Ambulatory Visit: Payer: Self-pay | Admitting: Internal Medicine

## 2016-01-05 DIAGNOSIS — I110 Hypertensive heart disease with heart failure: Secondary | ICD-10-CM | POA: Diagnosis not present

## 2016-01-05 DIAGNOSIS — I5032 Chronic diastolic (congestive) heart failure: Secondary | ICD-10-CM | POA: Diagnosis not present

## 2016-01-05 DIAGNOSIS — E119 Type 2 diabetes mellitus without complications: Secondary | ICD-10-CM | POA: Diagnosis not present

## 2016-01-05 DIAGNOSIS — I441 Atrioventricular block, second degree: Secondary | ICD-10-CM | POA: Diagnosis not present

## 2016-01-05 DIAGNOSIS — C8213 Follicular lymphoma grade II, intra-abdominal lymph nodes: Secondary | ICD-10-CM | POA: Diagnosis not present

## 2016-01-05 DIAGNOSIS — J181 Lobar pneumonia, unspecified organism: Secondary | ICD-10-CM | POA: Diagnosis not present

## 2016-01-12 ENCOUNTER — Other Ambulatory Visit: Payer: Self-pay

## 2016-01-12 MED ORDER — FORMOTEROL FUMARATE 20 MCG/2ML IN NEBU: 20.0000 ug | INHALATION_SOLUTION | Freq: Two times a day (BID) | RESPIRATORY_TRACT | 6 refills | Status: AC

## 2016-01-12 NOTE — Telephone Encounter (Signed)
Received refill request for perforomist from [place]. Rx has been sent to pharmacy with 6 refills. Nothing further needed.

## 2016-01-13 ENCOUNTER — Other Ambulatory Visit: Payer: Self-pay | Admitting: *Deleted

## 2016-01-13 MED ORDER — ALPRAZOLAM 0.25 MG PO TABS: 0.2500 mg | ORAL_TABLET | Freq: Four times a day (QID) | ORAL | 1 refills | Status: DC | PRN

## 2016-01-13 NOTE — Telephone Encounter (Signed)
Informed daughter that requested prescription was ready at the front desk.

## 2016-01-13 NOTE — Telephone Encounter (Signed)
Daughter called requesting XANAX refill. May I refill? Original order was from [facility].

## 2016-01-15 ENCOUNTER — Other Ambulatory Visit: Payer: Self-pay | Admitting: Oncology

## 2016-01-22 ENCOUNTER — Encounter: Payer: Self-pay | Admitting: General Surgery

## 2016-01-27 ENCOUNTER — Telehealth: Payer: Self-pay | Admitting: *Deleted

## 2016-01-27 MED ORDER — ALPRAZOLAM 0.25 MG PO TABS: 0.2500 mg | ORAL_TABLET | Freq: Four times a day (QID) | ORAL | 1 refills | Status: DC | PRN

## 2016-01-27 NOTE — Telephone Encounter (Signed)
Call returned to [place]. She has [employer] [facility] on hand and they work well, she has a problem with Hydrocodone so will not use Tussionex. Will await call regarding PET appt

## 2016-01-27 NOTE — Telephone Encounter (Signed)
Per md- have pt have her scheduled pet scan asap on 1/8 before managing the pleurex cath. For cough-send rx for Tussinex 2.5 mg twice daily with 60 ml bottle.

## 2016-01-27 NOTE — Telephone Encounter (Signed)
Having pain at Pleurex site, NOT where it comes out of skin, but where the tube goes up the side of ribs and it is a bit bluish colored. Also having increased cough and congestion. Asking if the tube could be out of place or if she could have pneumonia. Asking if we will handle this or does she need to see her PCP for this? Please advise

## 2016-01-28 ENCOUNTER — Ambulatory Visit (INDEPENDENT_AMBULATORY_CARE_PROVIDER_SITE_OTHER): Payer: Medicare Other | Admitting: Family Medicine

## 2016-01-28 ENCOUNTER — Ambulatory Visit
Admission: RE | Admit: 2016-01-28 | Discharge: 2016-01-28 | Disposition: A | Discharge status: Home / Self Care | Payer: Medicare Other | Source: Ambulatory Visit | Attending: Family Medicine | Admitting: Family Medicine

## 2016-01-28 ENCOUNTER — Encounter: Payer: Self-pay | Admitting: Family Medicine

## 2016-01-28 VITALS — BP 157/69 | HR 115 | Temp 99.3°F | Resp 16 | Ht 62.0 in | Wt 119.0 lb

## 2016-01-28 DIAGNOSIS — R0602 Shortness of breath: Secondary | ICD-10-CM

## 2016-01-28 DIAGNOSIS — B9789 Other viral agents as the cause of diseases classified elsewhere: Secondary | ICD-10-CM | POA: Diagnosis not present

## 2016-01-28 DIAGNOSIS — R05 Cough: Secondary | ICD-10-CM | POA: Diagnosis not present

## 2016-01-28 DIAGNOSIS — R059 Cough, unspecified: Secondary | ICD-10-CM

## 2016-01-28 DIAGNOSIS — J90 Pleural effusion, not elsewhere classified: Secondary | ICD-10-CM | POA: Insufficient documentation

## 2016-01-28 DIAGNOSIS — J988 Other specified respiratory disorders: Secondary | ICD-10-CM

## 2016-01-28 NOTE — Progress Notes (Signed)
Name: Caroline Russo   MRN: [id number]    DOB: 1919/06/07   Date:01/28/2016       Progress Note  Subjective  Chief Complaint  Chief Complaint  Patient presents with  . Shortness of Breath  . Cough    HPI Cough and SOB that started getting worse again yesterday.  No fever.  She is not using Advair and only rarely Albuterol.    No problem-specific Assessment & Plan notes found for this encounter.   Past Medical History:  Diagnosis Date  . Anxiety   . Cataract   . Chronic chest pain    a. nonobs cath 2002.  Marland Kitchen Decreased appetite   . Depression   . DM2 (diabetes mellitus, type 2) (HCC)    insulin requiring  . Heart murmur    a. 09/2014 Echo: EF 55-60%, nl wm, mod MS, mild MR, mod dil LA, mild-mod TR.  Marland Kitchen HTN (hypertension)   . Hypercholesterolemia   . Hypomagnesemia 06/13/2014  . Irregular cardiac rhythm   . Macular degeneration   . Murmur    hx  . Non Hodgkin's lymphoma ([facility])    On monthly Rituxan infusions  . Non-obstructive CAD   . Orthostatic hypotension   . Palpitations    a. 01/2011 Holter monitor showed frequent PACs, sinus bradycardia  . Tinnitus    chronic    Social History  Substance Use Topics  . Smoking status: Former Smoker    Packs/day: 0.25    Years: 7.00    Types: Cigarettes    Quit date: 01/25/1988  . Smokeless tobacco: Never Used  . Alcohol use Yes     Comment: drinks beer     Current Outpatient Prescriptions:  .  albuterol (PROVENTIL HFA;VENTOLIN HFA) 108 (90 Base) MCG/ACT inhaler, Inhale 1-2 puffs into the lungs every 4 (four) hours as needed for wheezing or shortness of breath., Disp: 1 Inhaler, Rfl: 2 .  ALPRAZolam (XANAX) 0.25 MG tablet, Take 1 tablet (0.25 mg total) by mouth every 6 (six) hours as needed., Disp: 30 tablet, Rfl: 1 .  benzonatate (TESSALON) 100 MG capsule, TAKE 1 CAPSULE(100 MG) BY MOUTH THREE TIMES DAILY AS NEEDED FOR COUGH, Disp: 30 capsule, Rfl: 6 .  budesonide (PULMICORT) 0.5 MG/2ML nebulizer solution, USE 1 VIAL VIA  NEBULIZER TWICE DAILY, Disp: 360 mL, Rfl: 6 .  cyanocobalamin 500 MCG tablet, Take 1,000 mcg by mouth daily., Disp: , Rfl:  .  docusate sodium (DOK) 100 MG capsule, TAKE 1 CAPSULE(100 MG) BY MOUTH TWICE DAILY, Disp: 60 capsule, Rfl: 3 .  feeding supplement, ENSURE ENLIVE, (ENSURE ENLIVE) LIQD, Take 237 mLs by mouth 3 (three) times daily between meals., Disp: 237 mL, Rfl: 12 .  fluticasone (FLONASE) 50 MCG/ACT nasal spray, Place 1 spray into both nostrils daily. (Patient taking differently: Place 2 sprays into both nostrils daily. ), Disp: 16 g, Rfl: 6 .  formoterol (PERFOROMIST) 20 MCG/2ML nebulizer solution, Take 2 mLs (20 mcg total) by nebulization 2 (two) times daily. DX: chronic bronchitis DX Code: J42, Disp: 120 mL, Rfl: 6 .  furosemide (LASIX) 20 MG tablet, Take 1 tablet (20 mg total) by mouth every other day. For 1 month, Disp: 15 tablet, Rfl: 0 .  hydrocortisone (ANUSOL-HC) 25 MG suppository, Place 1 suppository (25 mg total) rectally 2 (two) times daily. For 7 days, can repeat course if needed, Disp: 14 suppository, Rfl: 1 .  ipratropium-albuterol (DUONEB) 0.5-2.5 (3) MG/3ML SOLN, TAKE 3 MLS BY NEBULIZATION EVERY 4 HOURS AS NEEDED, Disp:  1620 mL, Rfl: 2 .  lidocaine-prilocaine (EMLA) cream, APPLY TOPICALLY AS NEEDED., Disp: 30 g, Rfl: 0 .  magnesium oxide (MAG-OX) 400 (241.3 Mg) MG tablet, Take 1 tablet (400 mg total) by mouth daily., Disp: 30 tablet, Rfl: 0 .  meclizine (ANTIVERT) 12.5 MG tablet, Take 12.5 mg by mouth 3 (three) times daily as needed., Disp: , Rfl:  .  megestrol (MEGACE) 40 MG/ML suspension, SHAKE LIQUID AND TAKE 10 ML(400 MG) BY MOUTH DAILY, Disp: 240 mL, Rfl: 0 .  Multiple Vitamin (MULTIVITAMIN WITH MINERALS) TABS tablet, Take 1 tablet by mouth daily., Disp: , Rfl:  .  nitroGLYCERIN (NITROSTAT) 0.4 MG SL tablet, Place 0.4 mg under the tongue every 5 (five) minutes x 3 doses as needed for chest pain. Reported on 07/15/2015, Disp: , Rfl:  .  pantoprazole (PROTONIX) 40 MG  tablet, Take 40 mg by mouth daily., Disp: , Rfl:  .  polyethylene glycol powder (GLYCOLAX/MIRALAX) powder, Take 17 g by mouth daily as needed., Disp: 3350 g, Rfl: 0 .  Respiratory Therapy Supplies (FLUTTER) DEVI, Use as directed, Disp: 1 each, Rfl: 0 .  SLOW-MAG 71.5-119 MG TBEC SR tablet, TAKE 1 TABLET (64 MG TOTAL) BY MOUTH 2 (TWO) TIMES DAILY., Disp: 60 tablet, Rfl: 3 .  traMADol (ULTRAM) 50 MG tablet, Take 1 tablet by mouth every 4-6 hours as needed for pain, Disp: 90 tablet, Rfl: 0 No current facility-administered medications for this visit.   Facility-Administered Medications Ordered in Other Visits:  .  heparin lock flush 100 unit/mL, 500 Units, Intravenous, Once, Forest Gleason, MD .  sodium chloride 0.9 % injection 10 mL, 10 mL, Intravenous, PRN, Forest Gleason, MD, 10 mL at 07/24/14 1020 .  sodium chloride 0.9 % injection 10 mL, 10 mL, Intravenous, PRN, Forest Gleason, MD, 10 mL at 08/12/14 0834  Allergies  Allergen Reactions  . Clindamycin Shortness Of Breath  . Moxifloxacin Shortness Of Breath    hallucinations  . Codeine Other (See Comments)    unknown  . Hydrocodone Other (See Comments)  . Prednisone     Hallucinations  . Sulfa Antibiotics Other (See Comments)  . Unasyn [Ampicillin-Sulbactam Sodium] Other (See Comments)    unknown  . Penicillins Itching and Rash    Has patient had a PCN reaction causing immediate rash, facial/tongue/throat swelling, SOB or lightheadedness with hypotension: Yes Has patient had a PCN reaction causing severe rash involving mucus membranes or skin necrosis: No Has patient had a PCN reaction that required hospitalization No Has patient had a PCN reaction occurring within the last 10 years: No If all of the above answers are "NO", then may proceed with Cephalosporin use.      Review of Systems  Constitutional: Positive for malaise/fatigue. Negative for chills, fever and weight loss.  HENT: Negative for congestion, ear pain, nosebleeds, sinus  pain and sore throat.   Eyes: Negative.  Negative for blurred vision.  Respiratory: Positive for cough, sputum production (mild) and shortness of breath. Negative for wheezing.   Cardiovascular: Negative for chest pain, palpitations and leg swelling.  Gastrointestinal: Negative for abdominal pain, blood in stool and heartburn.  Genitourinary: Negative for dysuria, frequency and urgency.  Musculoskeletal: Negative for myalgias.  Neurological: Positive for weakness (mild). Negative for dizziness, tingling, tremors and headaches.      Objective  Vitals:   01/28/16 1541  BP: (!) 157/69  Pulse: (!) 115  Resp: 16  Temp: 99.3 F (37.4 C)  TempSrc: Oral  SpO2: 96%  Weight: 119 lb (  54 kg)  Height: 5' 2"  (1.575 m)     Physical Exam  Constitutional: She is well-developed, well-nourished, and in no distress. No distress.  HENT:  Head: Normocephalic and atraumatic.  Cardiovascular: Normal rate, regular rhythm and normal heart sounds.  Exam reveals no gallop and no friction rub.   No murmur heard. Pulmonary/Chest: She is in respiratory distress (mild). She has no wheezes. She has no rales.  Musculoskeletal: She exhibits no edema.  Vitals reviewed.     Recent Results (from the past 2160 hour(s))  CBC with Differential     Status: Abnormal   Collection Time: 11/13/15  9:00 AM  Result Value Ref Range   WBC 3.9 3.6 - 11.0 K/uL   RBC 4.31 3.80 - 5.20 MIL/uL   Hemoglobin 12.0 12.0 - 16.0 g/dL   HCT 35.8 35.0 - 47.0 %   MCV 82.9 80.0 - 100.0 fL   MCH 27.8 26.0 - 34.0 pg   MCHC 33.5 32.0 - 36.0 g/dL   RDW 16.2 (H) 11.5 - 14.5 %   Platelets 260 150 - 440 K/uL   Neutrophils Relative % 62 %   Lymphocytes Relative 20 %   Monocytes Relative 11 %   Eosinophils Relative 6 %   Basophils Relative 1 %   Neutro Abs 2.5 1.4 - 6.5 K/uL   Lymphs Abs 0.8 (L) 1.0 - 3.6 K/uL   Monocytes Absolute 0.4 0.2 - 0.9 K/uL   Eosinophils Absolute 0.2 0 - 0.7 K/uL   Basophils Absolute 0.0 0 - 0.1 K/uL    Smear Review SMEAR SCANNED   Comprehensive metabolic panel     Status: Abnormal   Collection Time: 11/13/15  9:00 AM  Result Value Ref Range   Sodium 134 (L) 135 - 145 mmol/L   Potassium 3.9 3.5 - 5.1 mmol/L   Chloride 101 101 - 111 mmol/L   CO2 22 22 - 32 mmol/L   Glucose, Bld 260 (H) 65 - 99 mg/dL   BUN 14 6 - 20 mg/dL   Creatinine, Ser 0.79 0.44 - 1.00 mg/dL   Calcium 8.7 (L) 8.9 - 10.3 mg/dL   Total Protein 6.7 6.5 - 8.1 g/dL   Albumin 2.9 (L) 3.5 - 5.0 g/dL   AST 18 15 - 41 U/L   ALT 12 (L) 14 - 54 U/L   Alkaline Phosphatase 87 38 - 126 U/L   Total Bilirubin 0.8 0.3 - 1.2 mg/dL   GFR calc non Af Amer >60 >60 mL/min   GFR calc Af Amer >60 >60 mL/min    Comment: (NOTE) The eGFR has been calculated using the CKD EPI equation. This calculation has not been validated in all clinical situations. eGFR's persistently <60 mL/min signify possible Chronic Kidney Disease.    Anion gap 11 5 - 15  Magnesium     Status: Abnormal   Collection Time: 11/13/15  9:00 AM  Result Value Ref Range   Magnesium 1.4 (L) 1.7 - 2.4 mg/dL  CBC with Differential     Status: Abnormal   Collection Time: 11/20/15 12:45 PM  Result Value Ref Range   WBC 3.5 (L) 3.6 - 11.0 K/uL   RBC 4.64 3.80 - 5.20 MIL/uL   Hemoglobin 12.5 12.0 - 16.0 g/dL   HCT 39.1 35.0 - 47.0 %   MCV 84.3 80.0 - 100.0 fL   MCH 27.0 26.0 - 34.0 pg   MCHC 32.0 32.0 - 36.0 g/dL   RDW 16.3 (H) 11.5 - 14.5 %  Platelets 355 150 - 440 K/uL    Comment: COUNT MAY BE INACCURATE DUE TO FIBRIN CLUMPS.  sdr    Neutrophils Relative % 25 %   Lymphocytes Relative 38 %   Monocytes Relative 24 %   Eosinophils Relative 2 %   Basophils Relative 2 %   Band Neutrophils 5 %   Metamyelocytes Relative 3 %   Myelocytes 1 %   Promyelocytes Absolute 0 %   Blasts 0 %   nRBC 0 0 /100 WBC   Other 0 %   Neutro Abs 1.2 (L) 1.4 - 6.5 K/uL   Lymphs Abs 1.3 1.0 - 3.6 K/uL   Monocytes Absolute 0.8 0.2 - 0.9 K/uL   Eosinophils Absolute 0.1 0 - 0.7  K/uL   Basophils Absolute 0.1 0 - 0.1 K/uL   RBC Morphology MIXED RBC POPULATION    Smear Review LARGE PLATELETS PRESENT   Basic metabolic panel     Status: Abnormal   Collection Time: 11/20/15 12:45 PM  Result Value Ref Range   Sodium 133 (L) 135 - 145 mmol/L   Potassium 4.5 3.5 - 5.1 mmol/L   Chloride 101 101 - 111 mmol/L   CO2 20 (L) 22 - 32 mmol/L   Glucose, Bld 211 (H) 65 - 99 mg/dL   BUN 17 6 - 20 mg/dL   Creatinine, Ser 0.88 0.44 - 1.00 mg/dL   Calcium 8.9 8.9 - 10.3 mg/dL   GFR calc non Af Amer 55 (L) >60 mL/min   GFR calc Af Amer >60 >60 mL/min    Comment: (NOTE) The eGFR has been calculated using the CKD EPI equation. This calculation has not been validated in all clinical situations. eGFR's persistently <60 mL/min signify possible Chronic Kidney Disease.    Anion gap 12 5 - 15  Blood culture (routine x 2)     Status: None   Collection Time: 11/20/15  2:00 PM  Result Value Ref Range   Specimen Description BLOOD RIGHT ARM    Special Requests AER 8CC ANA 8CC    Culture NO GROWTH 5 DAYS    Report Status 11/25/2015 FINAL   Blood culture (routine x 2)     Status: None   Collection Time: 11/20/15  2:01 PM  Result Value Ref Range   Specimen Description BLOOD LEFT ARM    Special Requests AER 8CC ANA 8CC    Culture NO GROWTH 5 DAYS    Report Status 11/25/2015 FINAL   Urinalysis complete, with microscopic ([facility] only)     Status: Abnormal   Collection Time: 11/20/15  2:03 PM  Result Value Ref Range   Color, Urine YELLOW (A) YELLOW   APPearance CLEAR (A) CLEAR   Glucose, UA NEGATIVE NEGATIVE mg/dL   Bilirubin Urine NEGATIVE NEGATIVE   Ketones, ur TRACE (A) NEGATIVE mg/dL   Specific Gravity, Urine 1.017 1.005 - 1.030   Hgb urine dipstick NEGATIVE NEGATIVE   pH 5.0 5.0 - 8.0   Protein, ur 30 (A) NEGATIVE mg/dL   Nitrite NEGATIVE NEGATIVE   Leukocytes, UA NEGATIVE NEGATIVE   RBC / HPF NONE SEEN 0 - 5 RBC/hpf   WBC, UA 0-5 0 - 5 WBC/hpf   Bacteria, UA NONE SEEN NONE  SEEN   Squamous Epithelial / LPF NONE SEEN NONE SEEN   Mucous PRESENT    Granular Casts, UA PRESENT   MRSA PCR Screening     Status: None   Collection Time: 11/20/15  6:55 PM  Result Value Ref Range  MRSA by PCR NEGATIVE NEGATIVE    Comment:        The GeneXpert MRSA Assay (FDA approved for NASAL specimens only), is one component of a comprehensive MRSA colonization surveillance program. It is not intended to diagnose MRSA infection nor to guide or monitor treatment for MRSA infections.   Basic metabolic panel     Status: Abnormal   Collection Time: 11/21/15  9:23 AM  Result Value Ref Range   Sodium 133 (L) 135 - 145 mmol/L   Potassium 4.2 3.5 - 5.1 mmol/L   Chloride 104 101 - 111 mmol/L   CO2 21 (L) 22 - 32 mmol/L   Glucose, Bld 255 (H) 65 - 99 mg/dL   BUN 17 6 - 20 mg/dL   Creatinine, Ser 0.88 0.44 - 1.00 mg/dL   Calcium 8.0 (L) 8.9 - 10.3 mg/dL   GFR calc non Af Amer 55 (L) >60 mL/min   GFR calc Af Amer >60 >60 mL/min    Comment: (NOTE) The eGFR has been calculated using the CKD EPI equation. This calculation has not been validated in all clinical situations. eGFR's persistently <60 mL/min signify possible Chronic Kidney Disease.    Anion gap 8 5 - 15  CBC     Status: Abnormal   Collection Time: 11/21/15  9:23 AM  Result Value Ref Range   WBC 3.8 3.6 - 11.0 K/uL   RBC 3.72 (L) 3.80 - 5.20 MIL/uL   Hemoglobin 10.3 (L) 12.0 - 16.0 g/dL   HCT 30.6 (L) 35.0 - 47.0 %   MCV 82.4 80.0 - 100.0 fL   MCH 27.7 26.0 - 34.0 pg   MCHC 33.6 32.0 - 36.0 g/dL   RDW 16.2 (H) 11.5 - 14.5 %   Platelets 262 150 - 440 K/uL  Culture, expectorated sputum-assessment     Status: None   Collection Time: 11/21/15  1:41 PM  Result Value Ref Range   Specimen Description SPU    Special Requests Immunocompromised    Sputum evaluation THIS SPECIMEN IS ACCEPTABLE FOR SPUTUM CULTURE    Report Status 11/21/2015 FINAL   Culture, respiratory (NON-Expectorated)     Status: None   Collection  Time: 11/21/15  1:41 PM  Result Value Ref Range   Specimen Description SPU    Special Requests Immunocompromised Reflexed from [id number]    Gram Stain      MODERATE WBC PRESENT, PREDOMINANTLY PMN RARE SQUAMOUS EPITHELIAL CELLS PRESENT ABUNDANT GRAM POSITIVE RODS RARE BUDDING YEAST SEEN RARE GRAM VARIABLE ROD    Culture      Consistent with normal respiratory flora. Performed at [facility]    Report Status 11/24/2015 FINAL   CBC     Status: Abnormal   Collection Time: 11/22/15  5:19 AM  Result Value Ref Range   WBC 3.3 (L) 3.6 - 11.0 K/uL   RBC 3.43 (L) 3.80 - 5.20 MIL/uL   Hemoglobin 9.6 (L) 12.0 - 16.0 g/dL   HCT 28.2 (L) 35.0 - 47.0 %   MCV 82.1 80.0 - 100.0 fL   MCH 27.9 26.0 - 34.0 pg   MCHC 34.0 32.0 - 36.0 g/dL   RDW 16.0 (H) 11.5 - 14.5 %   Platelets 249 150 - 440 K/uL  Basic metabolic panel     Status: Abnormal   Collection Time: 11/23/15  4:43 AM  Result Value Ref Range   Sodium 136 135 - 145 mmol/L   Potassium 3.6 3.5 - 5.1 mmol/L   Chloride 101 101 -  111 mmol/L   CO2 26 22 - 32 mmol/L   Glucose, Bld 205 (H) 65 - 99 mg/dL   BUN 20 6 - 20 mg/dL   Creatinine, Ser 0.96 0.44 - 1.00 mg/dL   Calcium 8.7 (L) 8.9 - 10.3 mg/dL   GFR calc non Af Amer 49 (L) >60 mL/min   GFR calc Af Amer 57 (L) >60 mL/min    Comment: (NOTE) The eGFR has been calculated using the CKD EPI equation. This calculation has not been validated in all clinical situations. eGFR's persistently <60 mL/min signify possible Chronic Kidney Disease.    Anion gap 9 5 - 15  Magnesium     Status: Abnormal   Collection Time: 11/23/15  4:43 AM  Result Value Ref Range   Magnesium 1.3 (L) 1.7 - 2.4 mg/dL  Cryptococcus Antigen, Serum     Status: None   Collection Time: 11/23/15  4:43 AM  Result Value Ref Range   Cryptococcus Antigen, Serum Negative Negative    Comment: (NOTE) Performed At: [facility] [address] [place], [place] [id number] Lindon Romp MD GE:[id number]       Assessment & Plan  1. SOB (shortness of breath)  - DG Chest 2 View; Future-unchanged.  2. Cough  - DG Chest 2 View; Future  3. Viral respiratory infection Prednisone (she has supply), 10 mg., 1/2 tab twice a day for 4 days then 1/2 tab daily for 4 days.

## 2016-02-01 ENCOUNTER — Emergency Department
Admission: EM | Admit: 2016-02-01 | Discharge: 2016-02-01 | Discharge status: Against Medical Advice | Payer: Medicare Other

## 2016-02-01 ENCOUNTER — Ambulatory Visit
Admission: RE | Admit: 2016-02-01 | Discharge: 2016-02-01 | Disposition: A | Discharge status: Home / Self Care | Payer: Medicare Other | Source: Ambulatory Visit | Attending: Internal Medicine | Admitting: Internal Medicine

## 2016-02-01 ENCOUNTER — Telehealth: Payer: Self-pay | Admitting: *Deleted

## 2016-02-01 DIAGNOSIS — C8213 Follicular lymphoma grade II, intra-abdominal lymph nodes: Secondary | ICD-10-CM | POA: Diagnosis not present

## 2016-02-01 LAB — GLUCOSE, CAPILLARY: GLUCOSE-CAPILLARY: 126 mg/dL — AB (ref 65–99)

## 2016-02-01 NOTE — ED Triage Notes (Addendum)
Pt here from PET scan, reports they attempted IV x2 and patient with large amount of bruising to left hand. Bleeding controlled at this time, pressure dressing applied. Pt with hx of CA.

## 2016-02-01 NOTE — Telephone Encounter (Signed)
Not get IV and the site in the middle of her hand is bleeding under the skin and swelling, she is purple from fingers to wrist and if not elevated continues to bleed. Antecubital also blew and is bleeding under skin. She has bruising on her lower legs as well. Advised to send to ER

## 2016-02-03 ENCOUNTER — Inpatient Hospital Stay: Payer: Medicare Other | Admitting: Internal Medicine

## 2016-02-04 ENCOUNTER — Encounter: Payer: Self-pay | Admitting: General Surgery

## 2016-02-04 ENCOUNTER — Ambulatory Visit (INDEPENDENT_AMBULATORY_CARE_PROVIDER_SITE_OTHER): Payer: Medicare Other | Admitting: General Surgery

## 2016-02-04 VITALS — BP 144/62 | HR 66 | Ht 61.0 in | Wt 121.0 lb

## 2016-02-04 DIAGNOSIS — I739 Peripheral vascular disease, unspecified: Secondary | ICD-10-CM | POA: Diagnosis not present

## 2016-02-04 DIAGNOSIS — S8010XA Contusion of unspecified lower leg, initial encounter: Secondary | ICD-10-CM

## 2016-02-04 NOTE — Patient Instructions (Signed)
Return as needed

## 2016-02-04 NOTE — Progress Notes (Signed)
Patient ID: Caroline Russo, female   DOB: 02-06-1919, 83 y.o.   MRN: [id number]  Chief Complaint  Patient presents with  . Other    bruising on legs and hands    HPI Caroline Russo is a 83 y.o. female.  Here today for evaluation of bruising on legs and hands. This has been going on over a year now. Compression hose help some. Pt feels the bruising occurs easily but has not noted any sign of excessive bleeding. She is not on any type of blood thinners Her last treatment for Non Hodgkins Lymphoma was 11-13-15. I have reviewed the history of present illness with the patient.   HPI . Past Medical History:  Diagnosis Date  . Anxiety   . Cataract   . Chronic chest pain    a. nonobs cath 2002.  Marland Kitchen Decreased appetite   . Depression   . DM2 (diabetes mellitus, type 2) (HCC)    insulin requiring  . Heart murmur    a. 09/2014 Echo: EF 55-60%, nl wm, mod MS, mild MR, mod dil LA, mild-mod TR.  Marland Kitchen HTN (hypertension)   . Hypercholesterolemia   . Hypomagnesemia 06/13/2014  . Irregular cardiac rhythm   . Macular degeneration   . Murmur    hx  . Non Hodgkin's lymphoma (HCC) 2015   On monthly Rituxan infusions  . Non-obstructive CAD   . Orthostatic hypotension   . Palpitations    a. 01/2011 Holter monitor showed frequent PACs, sinus bradycardia  . Pneumonia June 2017 and Oct 2017  . Tinnitus    chronic    Past Surgical History:  Procedure Laterality Date  . ANKLE SURGERY Left   . CARDIAC CATHETERIZATION  2002  . INSERTION / PLACEMENT PLEURAL CATHETER    . KNEE SURGERY Left   . PARTIAL HYSTERECTOMY    . TOOTH EXTRACTION    . TUMOR REMOVAL  2012   benign neck tumor removed    Family History  Problem Relation Age of Onset  . Heart disease Mother   . Lung cancer Brother     smoked  . Bladder Cancer Brother   . Stomach cancer Brother     Social History Social History  Substance Use Topics  . Smoking status: Former Smoker    Packs/day: 0.25    Years: 7.00    Types:  Cigarettes    Quit date: 01/25/1988  . Smokeless tobacco: Never Used  . Alcohol use Yes     Comment: drinks beer    Allergies  Allergen Reactions  . Clindamycin Shortness Of Breath  . Moxifloxacin Shortness Of Breath    hallucinations  . Codeine Other (See Comments)    unknown  . Hydrocodone Other (See Comments)  . Prednisone     Hallucinations  . Sulfa Antibiotics Other (See Comments)  . Unasyn [Ampicillin-Sulbactam Sodium] Other (See Comments)    unknown  . Penicillins Itching and Rash    Has patient had a PCN reaction causing immediate rash, facial/tongue/throat swelling, SOB or lightheadedness with hypotension: Yes Has patient had a PCN reaction causing severe rash involving mucus membranes or skin necrosis: No Has patient had a PCN reaction that required hospitalization No Has patient had a PCN reaction occurring within the last 10 years: No If all of the above answers are "NO", then may proceed with Cephalosporin use.      Current Outpatient Prescriptions  Medication Sig Dispense Refill  . albuterol (PROVENTIL HFA;VENTOLIN HFA) 108 (90 Base) MCG/ACT inhaler Inhale  1-2 puffs into the lungs every 4 (four) hours as needed for wheezing or shortness of breath. 1 Inhaler 2  . ALPRAZolam (XANAX) 0.25 MG tablet Take 1 tablet (0.25 mg total) by mouth every 6 (six) hours as needed. 30 tablet 1  . benzonatate (TESSALON) 100 MG capsule TAKE 1 CAPSULE(100 MG) BY MOUTH THREE TIMES DAILY AS NEEDED FOR COUGH 30 capsule 6  . budesonide (PULMICORT) 0.5 MG/2ML nebulizer solution USE 1 VIAL VIA NEBULIZER TWICE DAILY 360 mL 6  . cyanocobalamin 500 MCG tablet Take 1,000 mcg by mouth daily.    Marland Kitchen docusate sodium (DOK) 100 MG capsule TAKE 1 CAPSULE(100 MG) BY MOUTH TWICE DAILY 60 capsule 3  . feeding supplement, ENSURE ENLIVE, (ENSURE ENLIVE) LIQD Take 237 mLs by mouth 3 (three) times daily between meals. 237 mL 12  . fluticasone (FLONASE) 50 MCG/ACT nasal spray Place 1 spray into both nostrils  daily. (Patient taking differently: Place 2 sprays into both nostrils daily. ) 16 g 6  . formoterol (PERFOROMIST) 20 MCG/2ML nebulizer solution Take 2 mLs (20 mcg total) by nebulization 2 (two) times daily. DX: chronic bronchitis DX Code: J42 120 mL 6  . furosemide (LASIX) 20 MG tablet Take 1 tablet (20 mg total) by mouth every other day. For 1 month 15 tablet 0  . ipratropium-albuterol (DUONEB) 0.5-2.5 (3) MG/3ML SOLN TAKE 3 MLS BY NEBULIZATION EVERY 4 HOURS AS NEEDED 1620 mL 2  . lidocaine-prilocaine (EMLA) cream APPLY TOPICALLY AS NEEDED. 30 g 0  . meclizine (ANTIVERT) 12.5 MG tablet Take 12.5 mg by mouth 3 (three) times daily as needed.    . megestrol (MEGACE) 40 MG/ML suspension SHAKE LIQUID AND TAKE 10 ML(400 MG) BY MOUTH DAILY 240 mL 0  . Multiple Vitamin (MULTIVITAMIN WITH MINERALS) TABS tablet Take 1 tablet by mouth daily.    . nitroGLYCERIN (NITROSTAT) 0.4 MG SL tablet Place 0.4 mg under the tongue every 5 (five) minutes x 3 doses as needed for chest pain. Reported on 07/15/2015    . pantoprazole (PROTONIX) 40 MG tablet Take 40 mg by mouth daily.    . polyethylene glycol powder (GLYCOLAX/MIRALAX) powder Take 17 g by mouth daily as needed. 3350 g 0  . Respiratory Therapy Supplies (FLUTTER) DEVI Use as directed 1 each 0  . SLOW-MAG 71.5-119 MG TBEC SR tablet TAKE 1 TABLET (64 MG TOTAL) BY MOUTH 2 (TWO) TIMES DAILY. 60 tablet 3  . traMADol (ULTRAM) 50 MG tablet Take 1 tablet by mouth every 4-6 hours as needed for pain 90 tablet 0   No current facility-administered medications for this visit.    Facility-Administered Medications Ordered in Other Visits  Medication Dose Route Frequency Provider Last Rate Last Dose  . heparin lock flush 100 unit/mL  500 Units Intravenous Once Johney Maine, MD      . sodium chloride 0.9 % injection 10 mL  10 mL Intravenous PRN Johney Maine, MD   10 mL at 07/24/14 1020  . sodium chloride 0.9 % injection 10 mL  10 mL Intravenous PRN Johney Maine, MD   10 mL at  08/12/14 7829    Review of Systems Review of Systems  Constitutional: Negative.   Respiratory: Negative.   Cardiovascular: Negative.     Blood pressure (!) 144/62, pulse 66, height 5\' 1"  (1.549 m), weight 121 lb (54.9 kg).  Physical Exam Physical Exam  Constitutional: She is oriented to person, place, and time. She appears well-developed and well-nourished.  Eyes: Conjunctivae are normal. No scleral icterus.  Neck: Neck supple.  Cardiovascular: Normal rate, regular rhythm and normal heart sounds.   Pulses:      Dorsalis pedis pulses are 1+ on the right side, and 1+ on the left side.       Posterior tibial pulses are 2+ on the right side, and 2+ on the left side.  No edema in legs.  Multiple bruises noted- no hematoma or any deep seated findings. Skin color otherwise is normal, capillary refill is brisk.  Pulmonary/Chest: Effort normal. She has wheezes.  Lymphadenopathy:    She has no cervical adenopathy.  Neurological: She is alert and oriented to person, place, and time.  Skin: Skin is warm and dry.  Psychiatric: Her behavior is normal.    Data Reviewed Notes and labs reviewed   Assessment   No evidence of any vascular abnormality Bilateral superficial bruising of the lower legs. Last platelet count was normal. No apparent cause for concern. Pt advised fully.     Plan    Patient educated that thin skin and weakened skin vessels are much more easily bruised. Platelet count is normal, not likely source of problem. Educated patient on benefit of using lotions to keep the skin clean and moist. Advised to see a hematologist to further workup if  Problem worsens   Patient to return as needed.  This information has been scribed by Ples Specter CMA.   Shunda Rabadi G 02/04/2016, 11:10 AM

## 2016-02-05 ENCOUNTER — Telehealth: Payer: Self-pay | Admitting: *Deleted

## 2016-02-05 NOTE — Telephone Encounter (Signed)
-----   Message from [place] sent at 02/03/2016 10:48 AM EST ----- Regarding: pet scan  Contact: [phone] Rescheduled pet scan to 02/10/16 @ 1230pm and See md for results on 02/12/16 @ 330. Called and gave appts to daughter [name] and she stated some concerns about her mother having the pet scan done again due to what happened the last pet. Daughter would like to s/w nurse before agreeing to do the pet scan again. Cb# [phone] [name]

## 2016-02-05 NOTE — Telephone Encounter (Signed)
Spoke with daughter. Pt agreeable to have pet scan as "long as a different tech attempts iv access."  I called and left a msg with nuc med tech. She will bring this pt request to Jodi's attn.  Daughter states that patient never received any additional dressing kits for pleurx cath from [facility]. They only box they sent was alcohol wipes. Daughter has [facility] phone number. She will call [facility] and voice this concern and call our office back should she need any additional assistance.  I explained to daughter that this order was sent in November 2016-[facility] should have this order on file.

## 2016-02-09 ENCOUNTER — Inpatient Hospital Stay: Payer: Medicare Other | Attending: Internal Medicine

## 2016-02-09 DIAGNOSIS — Z87891 Personal history of nicotine dependence: Secondary | ICD-10-CM | POA: Diagnosis not present

## 2016-02-09 DIAGNOSIS — Z794 Long term (current) use of insulin: Secondary | ICD-10-CM | POA: Insufficient documentation

## 2016-02-09 DIAGNOSIS — C8213 Follicular lymphoma grade II, intra-abdominal lymph nodes: Secondary | ICD-10-CM | POA: Insufficient documentation

## 2016-02-09 DIAGNOSIS — F419 Anxiety disorder, unspecified: Secondary | ICD-10-CM | POA: Insufficient documentation

## 2016-02-09 DIAGNOSIS — R05 Cough: Secondary | ICD-10-CM | POA: Insufficient documentation

## 2016-02-09 DIAGNOSIS — Z452 Encounter for adjustment and management of vascular access device: Secondary | ICD-10-CM | POA: Diagnosis not present

## 2016-02-09 DIAGNOSIS — Z8701 Personal history of pneumonia (recurrent): Secondary | ICD-10-CM | POA: Diagnosis not present

## 2016-02-09 DIAGNOSIS — Z7951 Long term (current) use of inhaled steroids: Secondary | ICD-10-CM | POA: Diagnosis not present

## 2016-02-09 DIAGNOSIS — F329 Major depressive disorder, single episode, unspecified: Secondary | ICD-10-CM | POA: Insufficient documentation

## 2016-02-09 DIAGNOSIS — Z801 Family history of malignant neoplasm of trachea, bronchus and lung: Secondary | ICD-10-CM | POA: Insufficient documentation

## 2016-02-09 DIAGNOSIS — J90 Pleural effusion, not elsewhere classified: Secondary | ICD-10-CM | POA: Diagnosis not present

## 2016-02-09 DIAGNOSIS — R011 Cardiac murmur, unspecified: Secondary | ICD-10-CM | POA: Insufficient documentation

## 2016-02-09 DIAGNOSIS — Z800 Family history of malignant neoplasm of digestive organs: Secondary | ICD-10-CM | POA: Insufficient documentation

## 2016-02-09 DIAGNOSIS — I10 Essential (primary) hypertension: Secondary | ICD-10-CM | POA: Diagnosis not present

## 2016-02-09 DIAGNOSIS — E7800 Pure hypercholesterolemia, unspecified: Secondary | ICD-10-CM | POA: Diagnosis not present

## 2016-02-09 DIAGNOSIS — E119 Type 2 diabetes mellitus without complications: Secondary | ICD-10-CM | POA: Diagnosis not present

## 2016-02-09 DIAGNOSIS — Z79899 Other long term (current) drug therapy: Secondary | ICD-10-CM | POA: Diagnosis not present

## 2016-02-09 MED ORDER — HEPARIN SOD (PORK) LOCK FLUSH 100 UNIT/ML IV SOLN
500.0000 [IU] | Freq: Once | INTRAVENOUS | Status: AC
  Administered 2016-02-09: 500 [IU] via INTRAVENOUS

## 2016-02-09 MED ORDER — SODIUM CHLORIDE 0.9% FLUSH
10.0000 mL | INTRAVENOUS | Status: DC | PRN
  Administered 2016-02-09: 10 mL via INTRAVENOUS
  Filled 2016-02-09: qty 10

## 2016-02-09 MED ORDER — HEPARIN SOD (PORK) LOCK FLUSH 100 UNIT/ML IV SOLN
INTRAVENOUS | Status: AC
  Filled 2016-02-09: qty 5

## 2016-02-10 ENCOUNTER — Encounter: Admission: RE | Admit: 2016-02-10 | Payer: Medicare Other | Source: Ambulatory Visit

## 2016-02-10 ENCOUNTER — Inpatient Hospital Stay: Payer: Medicare Other

## 2016-02-12 ENCOUNTER — Inpatient Hospital Stay: Payer: Medicare Other | Admitting: Internal Medicine

## 2016-02-12 ENCOUNTER — Ambulatory Visit: Payer: Medicare Other

## 2016-02-16 ENCOUNTER — Ambulatory Visit (INDEPENDENT_AMBULATORY_CARE_PROVIDER_SITE_OTHER): Payer: Medicare Other | Admitting: Family Medicine

## 2016-02-16 ENCOUNTER — Encounter: Payer: Self-pay | Admitting: Family Medicine

## 2016-02-16 VITALS — BP 136/65 | HR 96 | Temp 98.0°F | Resp 16 | Ht 61.0 in | Wt 122.0 lb

## 2016-02-16 DIAGNOSIS — B354 Tinea corporis: Secondary | ICD-10-CM

## 2016-02-16 MED ORDER — MUPIROCIN 2 % EX OINT: 1.0000 "application " | TOPICAL_OINTMENT | Freq: Three times a day (TID) | CUTANEOUS | 0 refills | Status: AC

## 2016-02-16 MED ORDER — TERBINAFINE HCL 1 % EX CREA: 1.0000 "application " | TOPICAL_CREAM | Freq: Three times a day (TID) | CUTANEOUS | 3 refills | Status: AC

## 2016-02-16 NOTE — Progress Notes (Signed)
Name: Caroline Russo   MRN: [id number]    DOB: 02/04/1919   Date:02/16/2016       Progress Note  Subjective  Chief Complaint  Chief Complaint  Patient presents with  . inflammation navel    HPI Here c/o inflammed, red, sore naval about 3 weeks.  No trauma.  It is less sore with H2O2 and alcohol.  Getting a little better.  No problem-specific Assessment & Plan notes found for this encounter.   Past Medical History:  Diagnosis Date  . Anxiety   . Cataract   . Chronic chest pain    a. nonobs cath 2002.  Marland Kitchen Decreased appetite   . Depression   . DM2 (diabetes mellitus, type 2) (HCC)    insulin requiring  . Heart murmur    a. 09/2014 Echo: EF 55-60%, nl wm, mod MS, mild MR, mod dil LA, mild-mod TR.  Marland Kitchen HTN (hypertension)   . Hypercholesterolemia   . Hypomagnesemia 06/13/2014  . Irregular cardiac rhythm   . Macular degeneration   . Murmur    hx  . Non Hodgkin's lymphoma ([place]) 2015   On monthly Rituxan infusions  . Non-obstructive CAD   . Orthostatic hypotension   . Palpitations    a. 01/2011 Holter monitor showed frequent PACs, sinus bradycardia  . Pneumonia June 2017 and Oct 2017  . Tinnitus    chronic    Social History  Substance Use Topics  . Smoking status: Former Smoker    Packs/day: 0.25    Years: 7.00    Types: Cigarettes    Quit date: 01/25/1988  . Smokeless tobacco: Never Used  . Alcohol use Yes     Comment: drinks beer     Current Outpatient Prescriptions:  .  albuterol (PROVENTIL HFA;VENTOLIN HFA) 108 (90 Base) MCG/ACT inhaler, Inhale 1-2 puffs into the lungs every 4 (four) hours as needed for wheezing or shortness of breath., Disp: 1 Inhaler, Rfl: 2 .  ALPRAZolam (XANAX) 0.25 MG tablet, Take 1 tablet (0.25 mg total) by mouth every 6 (six) hours as needed., Disp: 30 tablet, Rfl: 1 .  benzonatate (TESSALON) 100 MG capsule, TAKE 1 CAPSULE(100 MG) BY MOUTH THREE TIMES DAILY AS NEEDED FOR COUGH, Disp: 30 capsule, Rfl: 6 .  budesonide (PULMICORT) 0.5 MG/2ML  nebulizer solution, USE 1 VIAL VIA NEBULIZER TWICE DAILY, Disp: 360 mL, Rfl: 6 .  cyanocobalamin 500 MCG tablet, Take 1,000 mcg by mouth daily., Disp: , Rfl:  .  docusate sodium (DOK) 100 MG capsule, TAKE 1 CAPSULE(100 MG) BY MOUTH TWICE DAILY, Disp: 60 capsule, Rfl: 3 .  feeding supplement, ENSURE ENLIVE, (ENSURE ENLIVE) LIQD, Take 237 mLs by mouth 3 (three) times daily between meals., Disp: 237 mL, Rfl: 12 .  fluticasone (FLONASE) 50 MCG/ACT nasal spray, Place 1 spray into both nostrils daily. (Patient taking differently: Place 2 sprays into both nostrils daily. ), Disp: 16 g, Rfl: 6 .  formoterol (PERFOROMIST) 20 MCG/2ML nebulizer solution, Take 2 mLs (20 mcg total) by nebulization 2 (two) times daily. DX: chronic bronchitis DX Code: J42, Disp: 120 mL, Rfl: 6 .  furosemide (LASIX) 20 MG tablet, Take 1 tablet (20 mg total) by mouth every other day. For 1 month, Disp: 15 tablet, Rfl: 0 .  ipratropium-albuterol (DUONEB) 0.5-2.5 (3) MG/3ML SOLN, TAKE 3 MLS BY NEBULIZATION EVERY 4 HOURS AS NEEDED, Disp: 1620 mL, Rfl: 2 .  lidocaine-prilocaine (EMLA) cream, APPLY TOPICALLY AS NEEDED., Disp: 30 g, Rfl: 0 .  meclizine (ANTIVERT) 12.5 MG tablet,  Take 12.5 mg by mouth 3 (three) times daily as needed., Disp: , Rfl:  .  megestrol (MEGACE) 40 MG/ML suspension, SHAKE LIQUID AND TAKE 10 ML(400 MG) BY MOUTH DAILY, Disp: 240 mL, Rfl: 0 .  Multiple Vitamin (MULTIVITAMIN WITH MINERALS) TABS tablet, Take 1 tablet by mouth daily., Disp: , Rfl:  .  nitroGLYCERIN (NITROSTAT) 0.4 MG SL tablet, Place 0.4 mg under the tongue every 5 (five) minutes x 3 doses as needed for chest pain. Reported on 07/15/2015, Disp: , Rfl:  .  pantoprazole (PROTONIX) 40 MG tablet, Take 40 mg by mouth daily., Disp: , Rfl:  .  polyethylene glycol powder (GLYCOLAX/MIRALAX) powder, Take 17 g by mouth daily as needed., Disp: 3350 g, Rfl: 0 .  Respiratory Therapy Supplies (FLUTTER) DEVI, Use as directed, Disp: 1 each, Rfl: 0 .  SLOW-MAG 71.5-119 MG  TBEC SR tablet, TAKE 1 TABLET (64 MG TOTAL) BY MOUTH 2 (TWO) TIMES DAILY., Disp: 60 tablet, Rfl: 3 .  traMADol (ULTRAM) 50 MG tablet, Take 1 tablet by mouth every 4-6 hours as needed for pain, Disp: 90 tablet, Rfl: 0 No current facility-administered medications for this visit.   Facility-Administered Medications Ordered in Other Visits:  .  heparin lock flush 100 unit/mL, 500 Units, Intravenous, Once, Forest Gleason, MD .  sodium chloride 0.9 % injection 10 mL, 10 mL, Intravenous, PRN, Forest Gleason, MD, 10 mL at 07/24/14 1020 .  sodium chloride 0.9 % injection 10 mL, 10 mL, Intravenous, PRN, Forest Gleason, MD, 10 mL at 08/12/14 0834  Allergies  Allergen Reactions  . Clindamycin Shortness Of Breath  . Moxifloxacin Shortness Of Breath    hallucinations  . Codeine Other (See Comments)    unknown  . Hydrocodone Other (See Comments)  . Prednisone     Hallucinations  . Sulfa Antibiotics Other (See Comments)  . Unasyn [Ampicillin-Sulbactam Sodium] Other (See Comments)    unknown  . Penicillins Itching and Rash    Has patient had a PCN reaction causing immediate rash, facial/tongue/throat swelling, SOB or lightheadedness with hypotension: Yes Has patient had a PCN reaction causing severe rash involving mucus membranes or skin necrosis: No Has patient had a PCN reaction that required hospitalization No Has patient had a PCN reaction occurring within the last 10 years: No If all of the above answers are "NO", then may proceed with Cephalosporin use.      Review of Systems  Constitutional: Negative.   HENT: Negative.   Eyes: Negative.   Respiratory: Positive for cough.   Cardiovascular: Negative.   Gastrointestinal: Negative.   Genitourinary: Negative.   Musculoskeletal: Negative.   Skin:       inflammed naval area.  Neurological: Negative.       Objective  Vitals:   02/16/16 1344  BP: 136/65  Pulse: 96  Resp: 16  Temp: 98 F (36.7 C)  TempSrc: Oral  SpO2: 98%  Weight:  122 lb (55.3 kg)  Height: 5' 1"  (1.549 m)     Physical Exam  Constitutional: She is oriented to person, place, and time and well-developed, well-nourished, and in no distress. No distress.  Neurological: She is alert and oriented to person, place, and time.  Skin:  Red inflamed skin around navel with thick brown debris in navel.   Red inflamed rash beneath L breast; raw appearing.  Vitals reviewed.     Recent Results (from the past 2160 hour(s))  CBC with Differential     Status: Abnormal   Collection Time: 11/20/15 12:45  PM  Result Value Ref Range   WBC 3.5 (L) 3.6 - 11.0 K/uL   RBC 4.64 3.80 - 5.20 MIL/uL   Hemoglobin 12.5 12.0 - 16.0 g/dL   HCT 39.1 35.0 - 47.0 %   MCV 84.3 80.0 - 100.0 fL   MCH 27.0 26.0 - 34.0 pg   MCHC 32.0 32.0 - 36.0 g/dL   RDW 16.3 (H) 11.5 - 14.5 %   Platelets 355 150 - 440 K/uL    Comment: COUNT MAY BE INACCURATE DUE TO FIBRIN CLUMPS.  sdr    Neutrophils Relative % 25 %   Lymphocytes Relative 38 %   Monocytes Relative 24 %   Eosinophils Relative 2 %   Basophils Relative 2 %   Band Neutrophils 5 %   Metamyelocytes Relative 3 %   Myelocytes 1 %   Promyelocytes Absolute 0 %   Blasts 0 %   nRBC 0 0 /100 WBC   Other 0 %   Neutro Abs 1.2 (L) 1.4 - 6.5 K/uL   Lymphs Abs 1.3 1.0 - 3.6 K/uL   Monocytes Absolute 0.8 0.2 - 0.9 K/uL   Eosinophils Absolute 0.1 0 - 0.7 K/uL   Basophils Absolute 0.1 0 - 0.1 K/uL   RBC Morphology MIXED RBC POPULATION    Smear Review LARGE PLATELETS PRESENT   Basic metabolic panel     Status: Abnormal   Collection Time: 11/20/15 12:45 PM  Result Value Ref Range   Sodium 133 (L) 135 - 145 mmol/L   Potassium 4.5 3.5 - 5.1 mmol/L   Chloride 101 101 - 111 mmol/L   CO2 20 (L) 22 - 32 mmol/L   Glucose, Bld 211 (H) 65 - 99 mg/dL   BUN 17 6 - 20 mg/dL   Creatinine, Ser 0.88 0.44 - 1.00 mg/dL   Calcium 8.9 8.9 - 10.3 mg/dL   GFR calc non Af Amer 55 (L) >60 mL/min   GFR calc Af Amer >60 >60 mL/min    Comment:  (NOTE) The eGFR has been calculated using the CKD EPI equation. This calculation has not been validated in all clinical situations. eGFR's persistently <60 mL/min signify possible Chronic Kidney Disease.    Anion gap 12 5 - 15  Blood culture (routine x 2)     Status: None   Collection Time: 11/20/15  2:00 PM  Result Value Ref Range   Specimen Description BLOOD RIGHT ARM    Special Requests AER 8CC ANA 8CC    Culture NO GROWTH 5 DAYS    Report Status 11/25/2015 FINAL   Blood culture (routine x 2)     Status: None   Collection Time: 11/20/15  2:01 PM  Result Value Ref Range   Specimen Description BLOOD LEFT ARM    Special Requests AER 8CC ANA 8CC    Culture NO GROWTH 5 DAYS    Report Status 11/25/2015 FINAL   Urinalysis complete, with microscopic ([facility] only)     Status: Abnormal   Collection Time: 11/20/15  2:03 PM  Result Value Ref Range   Color, Urine YELLOW (A) YELLOW   APPearance CLEAR (A) CLEAR   Glucose, UA NEGATIVE NEGATIVE mg/dL   Bilirubin Urine NEGATIVE NEGATIVE   Ketones, ur TRACE (A) NEGATIVE mg/dL   Specific Gravity, Urine 1.017 1.005 - 1.030   Hgb urine dipstick NEGATIVE NEGATIVE   pH 5.0 5.0 - 8.0   Protein, ur 30 (A) NEGATIVE mg/dL   Nitrite NEGATIVE NEGATIVE   Leukocytes, UA NEGATIVE NEGATIVE  RBC / HPF NONE SEEN 0 - 5 RBC/hpf   WBC, UA 0-5 0 - 5 WBC/hpf   Bacteria, UA NONE SEEN NONE SEEN   Squamous Epithelial / LPF NONE SEEN NONE SEEN   Mucous PRESENT    Granular Casts, UA PRESENT   MRSA PCR Screening     Status: None   Collection Time: 11/20/15  6:55 PM  Result Value Ref Range   MRSA by PCR NEGATIVE NEGATIVE    Comment:        The GeneXpert MRSA Assay (FDA approved for NASAL specimens only), is one component of a comprehensive MRSA colonization surveillance program. It is not intended to diagnose MRSA infection nor to guide or monitor treatment for MRSA infections.   Basic metabolic panel     Status: Abnormal   Collection Time: 11/21/15  9:23  AM  Result Value Ref Range   Sodium 133 (L) 135 - 145 mmol/L   Potassium 4.2 3.5 - 5.1 mmol/L   Chloride 104 101 - 111 mmol/L   CO2 21 (L) 22 - 32 mmol/L   Glucose, Bld 255 (H) 65 - 99 mg/dL   BUN 17 6 - 20 mg/dL   Creatinine, Ser 0.88 0.44 - 1.00 mg/dL   Calcium 8.0 (L) 8.9 - 10.3 mg/dL   GFR calc non Af Amer 55 (L) >60 mL/min   GFR calc Af Amer >60 >60 mL/min    Comment: (NOTE) The eGFR has been calculated using the CKD EPI equation. This calculation has not been validated in all clinical situations. eGFR's persistently <60 mL/min signify possible Chronic Kidney Disease.    Anion gap 8 5 - 15  CBC     Status: Abnormal   Collection Time: 11/21/15  9:23 AM  Result Value Ref Range   WBC 3.8 3.6 - 11.0 K/uL   RBC 3.72 (L) 3.80 - 5.20 MIL/uL   Hemoglobin 10.3 (L) 12.0 - 16.0 g/dL   HCT 30.6 (L) 35.0 - 47.0 %   MCV 82.4 80.0 - 100.0 fL   MCH 27.7 26.0 - 34.0 pg   MCHC 33.6 32.0 - 36.0 g/dL   RDW 16.2 (H) 11.5 - 14.5 %   Platelets 262 150 - 440 K/uL  Culture, expectorated sputum-assessment     Status: None   Collection Time: 11/21/15  1:41 PM  Result Value Ref Range   Specimen Description SPU    Special Requests Immunocompromised    Sputum evaluation THIS SPECIMEN IS ACCEPTABLE FOR SPUTUM CULTURE    Report Status 11/21/2015 FINAL   Culture, respiratory (NON-Expectorated)     Status: None   Collection Time: 11/21/15  1:41 PM  Result Value Ref Range   Specimen Description SPU    Special Requests Immunocompromised Reflexed from [id number]    Gram Stain      MODERATE WBC PRESENT, PREDOMINANTLY PMN RARE SQUAMOUS EPITHELIAL CELLS PRESENT ABUNDANT GRAM POSITIVE RODS RARE BUDDING YEAST SEEN RARE GRAM VARIABLE ROD    Culture      Consistent with normal respiratory flora. Performed at [facility]    Report Status 11/24/2015 FINAL   CBC     Status: Abnormal   Collection Time: 11/22/15  5:19 AM  Result Value Ref Range   WBC 3.3 (L) 3.6 - 11.0 K/uL   RBC 3.43 (L) 3.80 -  5.20 MIL/uL   Hemoglobin 9.6 (L) 12.0 - 16.0 g/dL   HCT 28.2 (L) 35.0 - 47.0 %   MCV 82.1 80.0 - 100.0 fL   MCH  27.9 26.0 - 34.0 pg   MCHC 34.0 32.0 - 36.0 g/dL   RDW 16.0 (H) 11.5 - 14.5 %   Platelets 249 150 - 440 K/uL  Basic metabolic panel     Status: Abnormal   Collection Time: 11/23/15  4:43 AM  Result Value Ref Range   Sodium 136 135 - 145 mmol/L   Potassium 3.6 3.5 - 5.1 mmol/L   Chloride 101 101 - 111 mmol/L   CO2 26 22 - 32 mmol/L   Glucose, Bld 205 (H) 65 - 99 mg/dL   BUN 20 6 - 20 mg/dL   Creatinine, Ser 0.96 0.44 - 1.00 mg/dL   Calcium 8.7 (L) 8.9 - 10.3 mg/dL   GFR calc non Af Amer 49 (L) >60 mL/min   GFR calc Af Amer 57 (L) >60 mL/min    Comment: (NOTE) The eGFR has been calculated using the CKD EPI equation. This calculation has not been validated in all clinical situations. eGFR's persistently <60 mL/min signify possible Chronic Kidney Disease.    Anion gap 9 5 - 15  Magnesium     Status: Abnormal   Collection Time: 11/23/15  4:43 AM  Result Value Ref Range   Magnesium 1.3 (L) 1.7 - 2.4 mg/dL  Cryptococcus Antigen, Serum     Status: None   Collection Time: 11/23/15  4:43 AM  Result Value Ref Range   Cryptococcus Antigen, Serum Negative Negative    Comment: (NOTE) Performed At: [facility] [address] [place], [place] [id number] Lindon Romp MD GE:[id number]   Glucose, capillary     Status: Abnormal   Collection Time: 02/01/16  9:43 AM  Result Value Ref Range   Glucose-Capillary 126 (H) 65 - 99 mg/dL     Assessment & Plan 1. Tinea of the body  - mupirocin ointment (BACTROBAN) 2 %; Apply 1 application topically 3 (three) times daily.  Dispense: 22 g; Refill: 0 - terbinafine (LAMISIL AT) 1 % cream; Apply 1 application topically 3 (three) times daily.  Dispense: 30 g; Refill: 3

## 2016-02-19 ENCOUNTER — Ambulatory Visit
Admission: RE | Admit: 2016-02-19 | Discharge: 2016-02-19 | Disposition: A | Discharge status: Home / Self Care | Payer: Medicare Other | Source: Ambulatory Visit | Attending: Internal Medicine | Admitting: Internal Medicine

## 2016-02-19 DIAGNOSIS — K5730 Diverticulosis of large intestine without perforation or abscess without bleeding: Secondary | ICD-10-CM | POA: Diagnosis not present

## 2016-02-19 DIAGNOSIS — K5900 Constipation, unspecified: Secondary | ICD-10-CM | POA: Insufficient documentation

## 2016-02-19 DIAGNOSIS — C8213 Follicular lymphoma grade II, intra-abdominal lymph nodes: Secondary | ICD-10-CM | POA: Insufficient documentation

## 2016-02-19 DIAGNOSIS — J320 Chronic maxillary sinusitis: Secondary | ICD-10-CM | POA: Insufficient documentation

## 2016-02-19 DIAGNOSIS — J323 Chronic sphenoidal sinusitis: Secondary | ICD-10-CM | POA: Diagnosis not present

## 2016-02-19 DIAGNOSIS — C8290 Follicular lymphoma, unspecified, unspecified site: Secondary | ICD-10-CM | POA: Diagnosis not present

## 2016-02-19 DIAGNOSIS — J90 Pleural effusion, not elsewhere classified: Secondary | ICD-10-CM | POA: Diagnosis not present

## 2016-02-19 DIAGNOSIS — J219 Acute bronchiolitis, unspecified: Secondary | ICD-10-CM | POA: Insufficient documentation

## 2016-02-19 LAB — GLUCOSE, CAPILLARY: GLUCOSE-CAPILLARY: 83 mg/dL (ref 65–99)

## 2016-02-19 MED ORDER — FLUDEOXYGLUCOSE F - 18 (FDG) INJECTION
11.4300 | Freq: Once | INTRAVENOUS | Status: AC | PRN
  Administered 2016-02-19: 11.43 via INTRAVENOUS

## 2016-02-22 DIAGNOSIS — J910 Malignant pleural effusion: Secondary | ICD-10-CM | POA: Diagnosis not present

## 2016-02-24 ENCOUNTER — Inpatient Hospital Stay (HOSPITAL_BASED_OUTPATIENT_CLINIC_OR_DEPARTMENT_OTHER): Payer: Medicare Other | Admitting: Internal Medicine

## 2016-02-24 VITALS — BP 160/67 | HR 108 | Temp 98.8°F | Wt 123.0 lb

## 2016-02-24 DIAGNOSIS — J90 Pleural effusion, not elsewhere classified: Secondary | ICD-10-CM

## 2016-02-24 DIAGNOSIS — I10 Essential (primary) hypertension: Secondary | ICD-10-CM

## 2016-02-24 DIAGNOSIS — F419 Anxiety disorder, unspecified: Secondary | ICD-10-CM | POA: Diagnosis not present

## 2016-02-24 DIAGNOSIS — Z794 Long term (current) use of insulin: Secondary | ICD-10-CM

## 2016-02-24 DIAGNOSIS — Z452 Encounter for adjustment and management of vascular access device: Secondary | ICD-10-CM

## 2016-02-24 DIAGNOSIS — R05 Cough: Secondary | ICD-10-CM

## 2016-02-24 DIAGNOSIS — Z7951 Long term (current) use of inhaled steroids: Secondary | ICD-10-CM

## 2016-02-24 DIAGNOSIS — Z8701 Personal history of pneumonia (recurrent): Secondary | ICD-10-CM

## 2016-02-24 DIAGNOSIS — E7800 Pure hypercholesterolemia, unspecified: Secondary | ICD-10-CM

## 2016-02-24 DIAGNOSIS — E119 Type 2 diabetes mellitus without complications: Secondary | ICD-10-CM

## 2016-02-24 DIAGNOSIS — R011 Cardiac murmur, unspecified: Secondary | ICD-10-CM

## 2016-02-24 DIAGNOSIS — F329 Major depressive disorder, single episode, unspecified: Secondary | ICD-10-CM | POA: Diagnosis not present

## 2016-02-24 DIAGNOSIS — Z87891 Personal history of nicotine dependence: Secondary | ICD-10-CM

## 2016-02-24 DIAGNOSIS — Z800 Family history of malignant neoplasm of digestive organs: Secondary | ICD-10-CM

## 2016-02-24 DIAGNOSIS — C8213 Follicular lymphoma grade II, intra-abdominal lymph nodes: Secondary | ICD-10-CM

## 2016-02-24 DIAGNOSIS — Z801 Family history of malignant neoplasm of trachea, bronchus and lung: Secondary | ICD-10-CM

## 2016-02-24 DIAGNOSIS — Z8342 Family history of familial hypercholesterolemia: Secondary | ICD-10-CM

## 2016-02-24 DIAGNOSIS — Z79899 Other long term (current) drug therapy: Secondary | ICD-10-CM

## 2016-02-24 NOTE — Assessment & Plan Note (Addendum)
#   Follicular lymphoma G-2  currently Rituxan therapy is on hold because of recent multifocal pneumonia. PET scan shows probable recurrent/progressive lymphadenopathy in the abdomen and pelvis.  # However on further treatment at this time given the multiple infections/ Hospitalization.   # Multifocal pneumonia- status post Zyvox in November 2017; improved clinically. PET scan continues to show infiltrative changes; although improvement noted.   # Recurrent right-sided pleural effusion- lymphoma versus pneumonia. Currently none. Discussed regarding taking it out given the pain. Patient's patient/daughter wants to keep it for now.  # Pain right-sided chest wall from the catheter. - The patient does not want any narcotic pain medication. Continue Tylenol when necessary   # The above plan of care was discussed with the patient and daughter in detail.  # # I reviewed the blood work- with the patient in detail; also reviewed the imaging independently [as summarized above]; and with the patient in detail.   # follow up in 2 months/labs.

## 2016-02-24 NOTE — Progress Notes (Signed)
Patient here today for follow up.  Patient c/o pain at plurex site

## 2016-02-24 NOTE — Progress Notes (Signed)
[facility] OFFICE PROGRESS NOTE  Patient Care Team: Arlis Porta., MD as PCP - General (Unknown Physician Specialty) Minna Merritts, MD as Consulting Physician (Cardiology) Cammie Sickle, MD as Consulting Physician (Oncology)  No matching staging information was found for the patient.   Oncology History   # 2015- Biopsy of para-aortic lymph node is consistent with follicular lymphoma, clinically stage as 3A Diagnosis in July of 2015. Started on rituximab on July 31, 2013 2. 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  female pleasant patient above history of Recurrent follicular lymphoma/ also a right pleural effusion presumed from lymphoma; Also history of multifocal pneumonia is here for follow-up/ to review the results of her restaging PET scan.  Patient's monthly Rituxan treatments have been on hold since October 2017 because of the multifocal pneumonia.   Patient notes overall improved cough and shortness of breath. However continues to have intermittent cough and without any phlegm.; No palpable noted from the catheter. In the interim she has not had any hospitalizations.  Her appetite is improving onMarinol. No nausea no vomiting.   Patient denies any fevers. Denies any nausea vomiting.No blood. Patient is walking herself with a cane.Marland Kitchen  REVIEW OF SYSTEMS:  A complete 10 point review of system is done which is negative except mentioned above/history of present illness.   PAST MEDICAL HISTORY :  Past Medical History:  Diagnosis Date  . Anxiety   . Cataract   . Chronic chest pain    a. nonobs cath 2002.  Marland Kitchen Decreased appetite   . Depression   . DM2  (diabetes mellitus, type 2) (HCC)    insulin requiring  . Heart murmur    a. 09/2014 Echo: EF 55-60%, nl wm, mod MS, mild MR, mod dil LA, mild-mod TR.  Marland Kitchen HTN (hypertension)   . Hypercholesterolemia   . Hypomagnesemia 06/13/2014  . Irregular cardiac rhythm   . Macular degeneration   . Murmur    hx  . Non Hodgkin's lymphoma ([place]) 2015   On monthly Rituxan infusions  . Non-obstructive CAD   . Orthostatic hypotension   . Palpitations    a. 01/2011 Holter monitor showed frequent PACs, sinus bradycardia  . Pneumonia June 2017 and Oct 2017  . Tinnitus    chronic    PAST SURGICAL HISTORY :   Past Surgical History:  Procedure Laterality Date  . ANKLE SURGERY Left   . CARDIAC CATHETERIZATION  2002  . INSERTION / PLACEMENT PLEURAL CATHETER    . KNEE SURGERY Left   . PARTIAL HYSTERECTOMY    . TOOTH EXTRACTION    . TUMOR REMOVAL  2012   benign neck tumor removed    FAMILY HISTORY :   Family History  Problem Relation Age of Onset  . Heart disease Mother   . Lung cancer Brother     smoked  . Bladder Cancer Brother   . Stomach cancer Brother     SOCIAL HISTORY:   Social History  Substance Use Topics  . Smoking status: Former Smoker    Packs/day: 0.25    Years: 7.00    Types: Cigarettes  Quit date: 01/25/1988  . Smokeless tobacco: Never Used  . Alcohol use Yes     Comment: drinks beer    ALLERGIES:  is allergic to clindamycin; moxifloxacin; codeine; hydrocodone; prednisone; sulfa antibiotics; unasyn [ampicillin-sulbactam sodium]; and penicillins.  MEDICATIONS:  Current Outpatient Prescriptions  Medication Sig Dispense Refill  . albuterol (PROVENTIL HFA;VENTOLIN HFA) 108 (90 Base) MCG/ACT inhaler Inhale 1-2 puffs into the lungs every 4 (four) hours as needed for wheezing or shortness of breath. 1 Inhaler 2  . ALPRAZolam (XANAX) 0.25 MG tablet Take 1 tablet (0.25 mg total) by mouth every 6 (six) hours as needed. 30 tablet 1  . benzonatate (TESSALON) 100 MG capsule TAKE 1  CAPSULE(100 MG) BY MOUTH THREE TIMES DAILY AS NEEDED FOR COUGH 30 capsule 6  . budesonide (PULMICORT) 0.5 MG/2ML nebulizer solution USE 1 VIAL VIA NEBULIZER TWICE DAILY 360 mL 6  . cyanocobalamin 500 MCG tablet Take 1,000 mcg by mouth daily.    Marland Kitchen docusate sodium (DOK) 100 MG capsule TAKE 1 CAPSULE(100 MG) BY MOUTH TWICE DAILY 60 capsule 3  . feeding supplement, ENSURE ENLIVE, (ENSURE ENLIVE) LIQD Take 237 mLs by mouth 3 (three) times daily between meals. 237 mL 12  . fluticasone (FLONASE) 50 MCG/ACT nasal spray Place 1 spray into both nostrils daily. (Patient taking differently: Place 2 sprays into both nostrils daily. ) 16 g 6  . formoterol (PERFOROMIST) 20 MCG/2ML nebulizer solution Take 2 mLs (20 mcg total) by nebulization 2 (two) times daily. DX: chronic bronchitis DX Code: J42 120 mL 6  . ipratropium-albuterol (DUONEB) 0.5-2.5 (3) MG/3ML SOLN TAKE 3 MLS BY NEBULIZATION EVERY 4 HOURS AS NEEDED 1620 mL 2  . lidocaine-prilocaine (EMLA) cream APPLY TOPICALLY AS NEEDED. 30 g 0  . meclizine (ANTIVERT) 12.5 MG tablet Take 12.5 mg by mouth 3 (three) times daily as needed.    . megestrol (MEGACE) 40 MG/ML suspension SHAKE LIQUID AND TAKE 10 ML(400 MG) BY MOUTH DAILY 240 mL 0  . Multiple Vitamin (MULTIVITAMIN WITH MINERALS) TABS tablet Take 1 tablet by mouth daily.    . mupirocin ointment (BACTROBAN) 2 % Apply 1 application topically 3 (three) times daily. 22 g 0  . nitroGLYCERIN (NITROSTAT) 0.4 MG SL tablet Place 0.4 mg under the tongue every 5 (five) minutes x 3 doses as needed for chest pain. Reported on 07/15/2015    . pantoprazole (PROTONIX) 40 MG tablet Take 40 mg by mouth daily.    . polyethylene glycol powder (GLYCOLAX/MIRALAX) powder Take 17 g by mouth daily as needed. 3350 g 0  . Respiratory Therapy Supplies (FLUTTER) DEVI Use as directed 1 each 0  . terbinafine (LAMISIL AT) 1 % cream Apply 1 application topically 3 (three) times daily. 30 g 3  . SLOW-MAG 71.5-119 MG TBEC SR tablet TAKE 1  TABLET (64 MG TOTAL) BY MOUTH 2 (TWO) TIMES DAILY. (Patient not taking: Reported on 02/24/2016) 60 tablet 3  . traMADol (ULTRAM) 50 MG tablet Take 1 tablet by mouth every 4-6 hours as needed for pain (Patient not taking: Reported on 02/24/2016) 90 tablet 0   No current facility-administered medications for this visit.    Facility-Administered Medications Ordered in Other Visits  Medication Dose Route Frequency Provider Last Rate Last Dose  . heparin lock flush 100 unit/mL  500 Units Intravenous Once Forest Gleason, MD      . sodium chloride 0.9 % injection 10 mL  10 mL Intravenous PRN Forest Gleason, MD   10 mL at 07/24/14 1020  . sodium  chloride 0.9 % injection 10 mL  10 mL Intravenous PRN Forest Gleason, MD   10 mL at 08/12/14 0834    PHYSICAL EXAMINATION: ECOG PERFORMANCE STATUS: 2 - Symptomatic, <50% confined to bed  BP (!) 160/67 (BP Location: Left Arm, Patient Position: Sitting)   Pulse (!) 108   Temp 98.8 F (37.1 C) (Tympanic)   Wt 123 lb (55.8 kg)   BMI 23.24 kg/m    Filed Weights   02/24/16 1202  Weight: 123 lb (55.8 kg)    GENERAL: [place] Caucasian female patient; Alert, no distress and comfortable.  Walking be herself/with a cane.   EYES: no pallor or icterus OROPHARYNX: no thrush or ulceration; dentures NECK: supple, no masses felt LYMPH:  no palpable lymphadenopathy in the cervical, axillary or inguinal regions LUNGS: Decreased breath sounds on the right side compared to the left  No wheeze or crackles HEART/CVS: regular rate & rhythm and no murmurs; No lower extremity edema ABDOMEN:abdomen soft, non-tender and normal bowel sounds Musculoskeletal:no cyanosis of digits and no clubbing  PSYCH: alert & oriented x 3 with fluent speech NEURO: no focal motor/sensory deficits SKIN:  no rashes or significant lesions  LABORATORY DATA:  I have reviewed the data as listed    Component Value Date/Time   NA 136 11/23/2015 0443   NA 134 (L) 05/15/2014 0855   K 3.6  11/23/2015 0443   K 3.3 (L) 05/15/2014 0855   CL 101 11/23/2015 0443   CL 101 05/15/2014 0855   CO2 26 11/23/2015 0443   CO2 27 05/15/2014 0855   GLUCOSE 205 (H) 11/23/2015 0443   GLUCOSE 213 (H) 05/15/2014 0855   BUN 20 11/23/2015 0443   BUN 9 05/15/2014 0855   CREATININE 0.96 11/23/2015 0443   CREATININE 0.75 05/15/2014 0855   CREATININE 0.82 04/22/2014   CALCIUM 8.7 (L) 11/23/2015 0443   CALCIUM 8.1 (L) 05/15/2014 0855   PROT 6.7 11/13/2015 0900   PROT 5.3 (L) 05/15/2014 0855   ALBUMIN 2.9 (L) 11/13/2015 0900   ALBUMIN 2.7 (L) 05/15/2014 0855   AST 18 11/13/2015 0900   AST 31 05/15/2014 0855   ALT 12 (L) 11/13/2015 0900   ALT 14 05/15/2014 0855   ALKPHOS 87 11/13/2015 0900   ALKPHOS 104 05/15/2014 0855   BILITOT 0.8 11/13/2015 0900   BILITOT 0.6 05/15/2014 0855   GFRNONAA 49 (L) 11/23/2015 0443   GFRNONAA >60 05/15/2014 0855   GFRAA 57 (L) 11/23/2015 0443   GFRAA >60 05/15/2014 0855    No results found for: SPEP, UPEP  Lab Results  Component Value Date   WBC 3.3 (L) 11/22/2015   NEUTROABS 1.2 (L) 11/20/2015   HGB 9.6 (L) 11/22/2015   HCT 28.2 (L) 11/22/2015   MCV 82.1 11/22/2015   PLT 249 11/22/2015      Chemistry      Component Value Date/Time   NA 136 11/23/2015 0443   NA 134 (L) 05/15/2014 0855   K 3.6 11/23/2015 0443   K 3.3 (L) 05/15/2014 0855   CL 101 11/23/2015 0443   CL 101 05/15/2014 0855   CO2 26 11/23/2015 0443   CO2 27 05/15/2014 0855   BUN 20 11/23/2015 0443   BUN 9 05/15/2014 0855   CREATININE 0.96 11/23/2015 0443   CREATININE 0.75 05/15/2014 0855   CREATININE 0.82 04/22/2014      Component Value Date/Time   CALCIUM 8.7 (L) 11/23/2015 0443   CALCIUM 8.1 (L) 05/15/2014 0855   ALKPHOS 87 11/13/2015 0900  ALKPHOS 104 05/15/2014 0855   AST 18 11/13/2015 0900   AST 31 05/15/2014 0855   ALT 12 (L) 11/13/2015 0900   ALT 14 05/15/2014 0855   BILITOT 0.8 11/13/2015 0900   BILITOT 0.6 05/15/2014 0855     IMPRESSION: 1. Mild worsening  of the right pelvic adenopathy with a new hypermetabolic node and mild increase in hypermetabolic activity of a prior node, currently [place] 5. 2. Several small nodes in the gastrohepatic ligament and porta hepatis are [place] 4. 3. Although improved compared to 11/20/15, there are continued confluent reticulonodular opacities in both lower lobes and in the right upper lobe which are mildly hypermetabolic, favoring atypical infectious bronchiolitis over lymphomatous infiltration of the lung. In addition there is a new sub solid nodule in the left upper lobe which is probably inflammatory and mildly hypermetabolic. A right chest tube is in place and there is a small right pleural effusion. 4. Other imaging findings of potential clinical significance: Chronic ischemic microvascular white matter disease. Atherosclerosis. Acute on chronic bilateral maxillary sinusitis with chronic left sphenoid sinusitis. Small effusion of the left mastoid air cells. Sigmoid diverticulosis. Prominent stool throughout the colon favors constipation.   Electronically Signed   By: Van Clines M.D.   On: 02/19/2016 14:57  RADIOGRAPHIC STUDIES: I have personally reviewed the radiological images as listed and agreed with the findings in the report. No results found.   ASSESSMENT & PLAN:  Follicular lymphoma grade II of intra-abdominal lymph nodes ([place]) # Follicular lymphoma G-2  currently Rituxan therapy is on hold because of recent multifocal pneumonia. PET scan shows probable recurrent/progressive lymphadenopathy in the abdomen and pelvis.  # However on further treatment at this time given the multiple infections/ Hospitalization.   # Multifocal pneumonia- status post Zyvox in November 2017; improved clinically. PET scan continues to show infiltrative changes; although improvement noted.   # Recurrent right-sided pleural effusion- lymphoma versus pneumonia. Currently none. Discussed regarding  taking it out given the pain. Patient's patient/daughter wants to keep it for now.  # Pain right-sided chest wall from the catheter. - The patient does not want any narcotic pain medication. Continue Tylenol when necessary   # The above plan of care was discussed with the patient and daughter in detail.  # # I reviewed the blood work- with the patient in detail; also reviewed the imaging independently [as summarized above]; and with the patient in detail.   # follow up in 2 months/labs.    Orders Placed This Encounter  Procedures  . CBC with Differential    Standing Status:   Future    Standing Expiration Date:   02/23/2017  . Comprehensive metabolic panel    Standing Status:   Future    Standing Expiration Date:   02/23/2017  . Lactate dehydrogenase    Standing Status:   Future    Standing Expiration Date:   02/23/2017      Cammie Sickle, MD 02/24/2016 1:40 PM

## 2016-02-25 ENCOUNTER — Telehealth: Payer: Self-pay | Admitting: *Deleted

## 2016-02-25 ENCOUNTER — Other Ambulatory Visit: Payer: Self-pay | Admitting: Internal Medicine

## 2016-02-25 NOTE — Telephone Encounter (Addendum)
Asking if Dr Rogue Bussing will be signing orders for wound supplies, gauze and transparent dressings. Please return call to [phone]  Ref # [id number]

## 2016-02-25 NOTE — Telephone Encounter (Signed)
Per Vickki Muff, RN, order was sent in December for this and is OK to fill it. [facility] informed and will be faxing and order to be signed

## 2016-03-01 ENCOUNTER — Ambulatory Visit (INDEPENDENT_AMBULATORY_CARE_PROVIDER_SITE_OTHER): Payer: Medicare Other | Admitting: Family Medicine

## 2016-03-01 ENCOUNTER — Encounter: Payer: Self-pay | Admitting: Family Medicine

## 2016-03-01 VITALS — BP 130/61 | HR 60 | Temp 98.2°F | Resp 16 | Ht 61.0 in | Wt 125.0 lb

## 2016-03-01 DIAGNOSIS — Z87898 Personal history of other specified conditions: Secondary | ICD-10-CM

## 2016-03-01 NOTE — Progress Notes (Signed)
Name: Caroline Russo   MRN: [id number]    DOB: 22-Apr-1919   Date:03/01/2016       Progress Note  Subjective  Chief Complaint  Chief Complaint  Patient presents with  . Wheezing    HPI Here c/o some wheezing sounds when she awakened this AM.  No SOB or cough.  No fever.  She has inhalers to use as needed.  No problem-specific Assessment & Plan notes found for this encounter.   Past Medical History:  Diagnosis Date  . Anxiety   . Cataract   . Chronic chest pain    a. nonobs cath 2002.  Marland Kitchen Decreased appetite   . Depression   . DM2 (diabetes mellitus, type 2) (HCC)    insulin requiring  . Heart murmur    a. 09/2014 Echo: EF 55-60%, nl wm, mod MS, mild MR, mod dil LA, mild-mod TR.  Marland Kitchen HTN (hypertension)   . Hypercholesterolemia   . Hypomagnesemia 06/13/2014  . Irregular cardiac rhythm   . Macular degeneration   . Murmur    hx  . Non Hodgkin's lymphoma ([place]) 2015   On monthly Rituxan infusions  . Non-obstructive CAD   . Orthostatic hypotension   . Palpitations    a. 01/2011 Holter monitor showed frequent PACs, sinus bradycardia  . Pneumonia June 2017 and Oct 2017  . Tinnitus    chronic    Social History  Substance Use Topics  . Smoking status: Former Smoker    Packs/day: 0.25    Years: 7.00    Types: Cigarettes    Quit date: 01/25/1988  . Smokeless tobacco: Never Used  . Alcohol use Yes     Comment: drinks beer     Current Outpatient Prescriptions:  .  albuterol (PROVENTIL HFA;VENTOLIN HFA) 108 (90 Base) MCG/ACT inhaler, Inhale 1-2 puffs into the lungs every 4 (four) hours as needed for wheezing or shortness of breath., Disp: 1 Inhaler, Rfl: 2 .  ALPRAZolam (XANAX) 0.25 MG tablet, Take 1 tablet (0.25 mg total) by mouth every 6 (six) hours as needed., Disp: 30 tablet, Rfl: 1 .  benzonatate (TESSALON) 100 MG capsule, TAKE 1 CAPSULE(100 MG) BY MOUTH THREE TIMES DAILY AS NEEDED FOR COUGH, Disp: 30 capsule, Rfl: 6 .  budesonide (PULMICORT) 0.5 MG/2ML nebulizer solution,  USE 1 VIAL VIA NEBULIZER TWICE DAILY, Disp: 360 mL, Rfl: 6 .  cyanocobalamin 500 MCG tablet, Take 1,000 mcg by mouth daily., Disp: , Rfl:  .  docusate sodium (DOK) 100 MG capsule, TAKE 1 CAPSULE(100 MG) BY MOUTH TWICE DAILY, Disp: 60 capsule, Rfl: 3 .  feeding supplement, ENSURE ENLIVE, (ENSURE ENLIVE) LIQD, Take 237 mLs by mouth 3 (three) times daily between meals., Disp: 237 mL, Rfl: 12 .  fluticasone (FLONASE) 50 MCG/ACT nasal spray, Place 1 spray into both nostrils daily. (Patient taking differently: Place 2 sprays into both nostrils daily. ), Disp: 16 g, Rfl: 6 .  formoterol (PERFOROMIST) 20 MCG/2ML nebulizer solution, Take 2 mLs (20 mcg total) by nebulization 2 (two) times daily. DX: chronic bronchitis DX Code: J42, Disp: 120 mL, Rfl: 6 .  ipratropium-albuterol (DUONEB) 0.5-2.5 (3) MG/3ML SOLN, TAKE 3 MLS BY NEBULIZATION EVERY 4 HOURS AS NEEDED, Disp: 1620 mL, Rfl: 2 .  lidocaine-prilocaine (EMLA) cream, APPLY TOPICALLY AS NEEDED., Disp: 30 g, Rfl: 0 .  meclizine (ANTIVERT) 12.5 MG tablet, Take 12.5 mg by mouth 3 (three) times daily as needed., Disp: , Rfl:  .  megestrol (MEGACE) 40 MG/ML suspension, SHAKE LIQUID AND TAKE 10  ML(400 MG) BY MOUTH DAILY, Disp: 240 mL, Rfl: 0 .  Multiple Vitamin (MULTIVITAMIN WITH MINERALS) TABS tablet, Take 1 tablet by mouth daily., Disp: , Rfl:  .  mupirocin ointment (BACTROBAN) 2 %, Apply 1 application topically 3 (three) times daily., Disp: 22 g, Rfl: 0 .  nitroGLYCERIN (NITROSTAT) 0.4 MG SL tablet, Place 0.4 mg under the tongue every 5 (five) minutes x 3 doses as needed for chest pain. Reported on 07/15/2015, Disp: , Rfl:  .  pantoprazole (PROTONIX) 40 MG tablet, Take 40 mg by mouth daily., Disp: , Rfl:  .  polyethylene glycol powder (GLYCOLAX/MIRALAX) powder, Take 17 g by mouth daily as needed., Disp: 3350 g, Rfl: 0 .  Respiratory Therapy Supplies (FLUTTER) DEVI, Use as directed, Disp: 1 each, Rfl: 0 .  SLOW-MAG 71.5-119 MG TBEC SR tablet, TAKE 1 TABLET (64 MG  TOTAL) BY MOUTH 2 (TWO) TIMES DAILY., Disp: 60 tablet, Rfl: 3 .  terbinafine (LAMISIL AT) 1 % cream, Apply 1 application topically 3 (three) times daily., Disp: 30 g, Rfl: 3 .  traMADol (ULTRAM) 50 MG tablet, Take 1 tablet by mouth every 4-6 hours as needed for pain, Disp: 90 tablet, Rfl: 0 .  ADVAIR DISKUS 250-50 MCG/DOSE AEPB, Inhale 1 puff into the lungs 2 (two) times daily., Disp: , Rfl: 4 No current facility-administered medications for this visit.   Facility-Administered Medications Ordered in Other Visits:  .  heparin lock flush 100 unit/mL, 500 Units, Intravenous, Once, Forest Gleason, MD .  sodium chloride 0.9 % injection 10 mL, 10 mL, Intravenous, PRN, Forest Gleason, MD, 10 mL at 07/24/14 1020 .  sodium chloride 0.9 % injection 10 mL, 10 mL, Intravenous, PRN, Forest Gleason, MD, 10 mL at 08/12/14 0834  Allergies  Allergen Reactions  . Clindamycin Shortness Of Breath  . Moxifloxacin Shortness Of Breath    hallucinations  . Codeine Other (See Comments)    unknown  . Hydrocodone Other (See Comments)  . Prednisone     Hallucinations  . Sulfa Antibiotics Other (See Comments)  . Unasyn [Ampicillin-Sulbactam Sodium] Other (See Comments)    unknown  . Penicillins Itching and Rash    Has patient had a PCN reaction causing immediate rash, facial/tongue/throat swelling, SOB or lightheadedness with hypotension: Yes Has patient had a PCN reaction causing severe rash involving mucus membranes or skin necrosis: No Has patient had a PCN reaction that required hospitalization No Has patient had a PCN reaction occurring within the last 10 years: No If all of the above answers are "NO", then may proceed with Cephalosporin use.      Review of Systems  Constitutional: Negative for chills, fever, malaise/fatigue and weight loss.  HENT: Negative for hearing loss and tinnitus.   Eyes: Negative for blurred vision and double vision.  Respiratory: Positive for cough and wheezing. Negative for  shortness of breath.   Cardiovascular: Negative for chest pain, palpitations and leg swelling.  Gastrointestinal: Negative for abdominal pain, blood in stool and heartburn.  Genitourinary: Negative for dysuria, frequency and urgency.  Skin: Negative for rash.  Neurological: Positive for weakness. Negative for dizziness, tingling, tremors and headaches.      Objective  Vitals:   03/01/16 1420  BP: 130/61  Pulse: (!) 49  Resp: 16  Temp: 98.2 F (36.8 C)  TempSrc: Oral  SpO2: 98%  Weight: 125 lb (56.7 kg)  Height: 5\' 1"  (1.549 m)     Physical Exam  Constitutional: She is oriented to person, place, and time and  well-developed, well-nourished, and in no distress. No distress.  HENT:  Head: Normocephalic.  Cardiovascular: Normal rate.  An irregularly irregular rhythm present.  Murmur heard.  Systolic murmur is present with a grade of 3/6  throughout  Pulmonary/Chest: Effort normal and breath sounds normal. No respiratory distress. She has no wheezes. She has no rales.  Musculoskeletal: She exhibits no edema.  Neurological: She is alert and oriented to person, place, and time.  Vitals reviewed.     Recent Results (from the past 2160 hour(s))  Glucose, capillary     Status: Abnormal   Collection Time: 02/01/16  9:43 AM  Result Value Ref Range   Glucose-Capillary 126 (H) 65 - 99 mg/dL  Glucose, capillary     Status: None   Collection Time: 02/19/16 11:34 AM  Result Value Ref Range   Glucose-Capillary 83 65 - 99 mg/dL     Assessment & Plan  1. Hx of wheezing Use inhalers as directed and as needed.

## 2016-03-06 ENCOUNTER — Other Ambulatory Visit: Payer: Self-pay | Admitting: Internal Medicine

## 2016-03-07 ENCOUNTER — Telehealth: Payer: Self-pay | Admitting: Internal Medicine

## 2016-03-07 MED ORDER — IPRATROPIUM-ALBUTEROL 0.5-2.5 (3) MG/3ML IN SOLN: RESPIRATORY_TRACT | 2 refills | Status: AC

## 2016-03-07 NOTE — Telephone Encounter (Signed)
Pharmacist called, states pt insurance is stating invalid diagnosis code for pt breathing medications. Please call. Pharmacist requests we call Medicare at [phone]. Option 3. Please contact pharmacy and let know when this is done.

## 2016-03-07 NOTE — Telephone Encounter (Signed)
Spoke with pharmacy and they state duoneb needs to be sent with correct dx code. RX sent. Nothing further needed.

## 2016-03-09 ENCOUNTER — Telehealth: Payer: Self-pay | Admitting: Internal Medicine

## 2016-03-09 MED ORDER — DOXYCYCLINE HYCLATE 100 MG PO TABS: 100.0000 mg | ORAL_TABLET | Freq: Two times a day (BID) | ORAL | 0 refills | Status: AC

## 2016-03-09 NOTE — Telephone Encounter (Signed)
Pt daughter states pt is wheezing , very badly, lots of congestion. Please call. If unable to reach on cell, please call on work [phone]

## 2016-03-09 NOTE — Telephone Encounter (Signed)
Denies fever. Prod cough w/yellow mucus. Taking Advair and perforomist, budesonide and duoneb. Has been duoneb multiple times daily.  Please advise on next step.

## 2016-03-09 NOTE — Telephone Encounter (Signed)
LMOM letting daughter know that RX has been sent and to call me back to let me know she got the message.   DK, DS ask to let you know what was done. Thanks.

## 2016-03-09 NOTE — Telephone Encounter (Signed)
Doxycycline ordered. Please show this call to DK so that he knows  Caroline Russo

## 2016-03-14 ENCOUNTER — Telehealth: Payer: Self-pay

## 2016-03-14 NOTE — Telephone Encounter (Signed)
Daughter(Cheryl) called reporting that social services came into the home this weekend.  They are currently reporting that Mrs. Marchetta is being negleted due to purple spots on legs and memory issues.  Daughter explained that mother(Mrs.Kemmerling) about 4 weeks ago signed over her house to [employer].  Her other sister found out and now is upset with family.  [name] assumes the sister is the one who called social services.  [name] is asking if you could write a letter about her mothers memory.  She is not sure if she needs this, however, she is just trying to get all the information she can get.  Please feel free to call [facility]

## 2016-03-14 NOTE — Telephone Encounter (Signed)
Need to get more information from DSS re: any concerns that they may have,-jh

## 2016-03-23 ENCOUNTER — Telehealth: Payer: Self-pay | Admitting: *Deleted

## 2016-03-23 ENCOUNTER — Other Ambulatory Visit: Payer: Self-pay

## 2016-03-23 DIAGNOSIS — J90 Pleural effusion, not elsewhere classified: Secondary | ICD-10-CM

## 2016-03-23 NOTE — Telephone Encounter (Signed)
Pleurex has not drained anything in 4 months and now has some dry particles in it. Asking what is to be done about it.  I spoke with Dr Genevive Bi regarding this and he states he will see her Friday and give her a call with the time. [name] notified

## 2016-03-25 ENCOUNTER — Encounter: Payer: Self-pay | Admitting: Cardiothoracic Surgery

## 2016-03-25 ENCOUNTER — Ambulatory Visit (INDEPENDENT_AMBULATORY_CARE_PROVIDER_SITE_OTHER): Payer: Medicare Other | Admitting: Cardiothoracic Surgery

## 2016-03-25 ENCOUNTER — Ambulatory Visit
Admission: RE | Admit: 2016-03-25 | Discharge: 2016-03-25 | Disposition: A | Discharge status: Home / Self Care | Payer: Medicare Other | Source: Ambulatory Visit | Attending: Cardiothoracic Surgery | Admitting: Cardiothoracic Surgery

## 2016-03-25 ENCOUNTER — Encounter
Admission: RE | Admit: 2016-03-25 | Discharge: 2016-03-25 | Disposition: A | Discharge status: Home / Self Care | Payer: Medicare Other | Source: Ambulatory Visit | Attending: Cardiothoracic Surgery | Admitting: Cardiothoracic Surgery

## 2016-03-25 ENCOUNTER — Telehealth: Payer: Self-pay

## 2016-03-25 VITALS — BP 161/74 | HR 85 | Temp 98.4°F | Resp 26 | Ht 61.0 in | Wt 121.2 lb

## 2016-03-25 DIAGNOSIS — J90 Pleural effusion, not elsewhere classified: Secondary | ICD-10-CM

## 2016-03-25 DIAGNOSIS — Z01818 Encounter for other preprocedural examination: Secondary | ICD-10-CM | POA: Diagnosis not present

## 2016-03-25 DIAGNOSIS — I700 Atherosclerosis of aorta: Secondary | ICD-10-CM | POA: Diagnosis not present

## 2016-03-25 DIAGNOSIS — R0602 Shortness of breath: Secondary | ICD-10-CM | POA: Diagnosis not present

## 2016-03-25 DIAGNOSIS — Z01812 Encounter for preprocedural laboratory examination: Secondary | ICD-10-CM | POA: Diagnosis not present

## 2016-03-25 HISTORY — DX: Family history of other specified conditions: Z84.89

## 2016-03-25 HISTORY — DX: Gastro-esophageal reflux disease without esophagitis: K21.9

## 2016-03-25 HISTORY — DX: Angina pectoris, unspecified: I20.9

## 2016-03-25 LAB — COMPREHENSIVE METABOLIC PANEL
ALBUMIN: 3.5 g/dL (ref 3.5–5.0)
ALK PHOS: 83 U/L (ref 38–126)
ALT: 8 U/L — AB (ref 14–54)
AST: 19 U/L (ref 15–41)
Anion gap: 11 (ref 5–15)
BILIRUBIN TOTAL: 0.6 mg/dL (ref 0.3–1.2)
BUN: 11 mg/dL (ref 6–20)
CALCIUM: 9.3 mg/dL (ref 8.9–10.3)
CO2: 23 mmol/L (ref 22–32)
CREATININE: 0.81 mg/dL (ref 0.44–1.00)
Chloride: 104 mmol/L (ref 101–111)
GFR calc Af Amer: >60 mL/min (ref >60)
GFR calc non Af Amer: >60 mL/min (ref >60)
GLUCOSE: 127 mg/dL — AB (ref 65–99)
Potassium: 3.9 mmol/L (ref 3.5–5.1)
Sodium: 138 mmol/L (ref 135–145)
TOTAL PROTEIN: 7.2 g/dL (ref 6.5–8.1)

## 2016-03-25 LAB — BLOOD GAS, ARTERIAL
Acid-base deficit: 2.8 mmol/L — ABNORMAL HIGH (ref 0.0–2.0)
Bicarbonate: 19.4 mmol/L — ABNORMAL LOW (ref 20.0–28.0)
FIO2: 0.21
O2 SAT: 94.8 %
PCO2 ART: 26 mmHg — AB (ref 32.0–48.0)
PH ART: 7.48 — AB (ref 7.350–7.450)
PO2 ART: 69 mmHg — AB (ref 83.0–108.0)
Patient temperature: 37

## 2016-03-25 LAB — PROTIME-INR
INR: 1.19
Prothrombin Time: 15.2 seconds (ref 11.4–15.2)

## 2016-03-25 LAB — CBC
HEMATOCRIT: 36.7 % (ref 35.0–47.0)
Hemoglobin: 12.4 g/dL (ref 12.0–16.0)
MCH: 27 pg (ref 26.0–34.0)
MCHC: 33.7 g/dL (ref 32.0–36.0)
MCV: 80.2 fL (ref 80.0–100.0)
Platelets: 271 10*3/uL (ref 150–440)
RBC: 4.57 MIL/uL (ref 3.80–5.20)
RDW: 17.6 % — ABNORMAL HIGH (ref 11.5–14.5)
WBC: 4.4 10*3/uL (ref 3.6–11.0)

## 2016-03-25 LAB — APTT: aPTT: 33 seconds (ref 24–36)

## 2016-03-25 NOTE — Patient Instructions (Signed)
Your procedure is scheduled on: 03/28/16 Report to [facility]. 2ND FLOOR MEDICAL MALL ENTRANCE. To find out your arrival time please call [phone] between 1PM - 3PM on today 03/25/16.  Remember: Instructions that are not followed completely may result in serious medical risk, up to and including death, or upon the discretion of your surgeon and anesthesiologist your surgery may need to be rescheduled.    __X__ 1. Do not eat food or drink liquids after midnight. No gum chewing or hard candies.     __X__ 2. No Alcohol for 24 hours before or after surgery.   ____ 3. Bring all medications with you on the day of surgery if instructed.    __X__ 4. Notify your doctor if there is any change in your medical condition     (cold, fever, infections).             ___x__5. No smoking within 24 hours of your surgery.     Do not wear jewelry, make-up, hairpins, clips or nail polish.  Do not wear lotions, powders, or perfumes.   Do not shave 48 hours prior to surgery. Men may shave face and neck.  Do not bring valuables to the hospital.    [facility] is not responsible for any belongings or valuables.               Contacts, dentures or bridgework may not be worn into surgery.  Leave your suitcase in the car. After surgery it may be brought to your room.  For patients admitted to the hospital, discharge time is determined by your                treatment team.   Patients discharged the day of surgery will not be allowed to drive home.   Please read over the following fact sheets that you were given:    __x__ Take these medicines the morning of surgery with A SIP OF WATER:    1. protonix  2.   3.   4.  5.  6.  ____ Fleet Enema (as directed)   ____ Use CHG Soap as directed  __x__ Use inhalers on the day of surgery and nebulizer treatments prior to arrival bring albuterol rescue inhaler to visit  ____ Stop metformin 2 days prior to surgery    ____ Take 1/2 of usual insulin dose the  night before surgery and none on the morning of surgery.   ____ Stop Coumadin/Plavix/aspirin on   __X__ Stop Anti-inflammatories such as Advil, Aleve, Ibuprofen, Motrin, Naproxen, Naprosyn, Goodies,powder, or aspirin products.  OK to take Tylenol.   ____ Stop supplements until after surgery.    ____ Bring C-Pap to the hospital.

## 2016-03-25 NOTE — Progress Notes (Signed)
Patient ID: Caroline Russo, female   DOB: [birth year], 83 y.o.   MRN: [id number]  Chief Complaint  Patient presents with  . Follow-up    Right Pleural Effusion    Referred By Dr. Rogue Bussing Reason for Referral presence of right Pleurx catheter which is currently nonfunctional secondary to diminished chest tube output  HPI Location, Quality, Duration, Severity, Timing, Context, Modifying Factors, Associated Signs and Symptoms.  Caroline Russo is a 83 y.o. female.  She underwent insertion of a right-sided Pleurx catheter approximate 2 years ago for recurrent right-sided pleural effusion. This has been managed off and on by her daughter. Back in November she states that she was able to drain a couple 100 cc once a week but since that time his had minimal output despite multiple attempts at drainage. She did have a chest x-ray done today which does not reveal any evidence of pleural effusion of significance. Now she would like to have her catheter removed. She states she does have pain whenever she coughs at the catheter site. She's also had a nonproductive cough.   Past Medical History:  Diagnosis Date  . Anxiety   . Cataract   . Chronic chest pain    a. nonobs cath 2002.  Marland Kitchen Decreased appetite   . Depression   . DM2 (diabetes mellitus, type 2) (HCC)    insulin requiring  . Heart murmur    a. 09/2014 Echo: EF 55-60%, nl wm, mod MS, mild MR, mod dil LA, mild-mod TR.  Marland Kitchen HTN (hypertension)   . Hypercholesterolemia   . Hypomagnesemia 06/13/2014  . Irregular cardiac rhythm   . Macular degeneration   . Murmur    hx  . Non Hodgkin's lymphoma ([place]) 2015   On monthly Rituxan infusions  . Non-obstructive CAD   . Orthostatic hypotension   . Palpitations    a. 01/2011 Holter monitor showed frequent PACs, sinus bradycardia  . Pneumonia June 2017 and Oct 2017  . Tinnitus    chronic    Past Surgical History:  Procedure Laterality Date  . ANKLE SURGERY Left   . CARDIAC CATHETERIZATION   2002  . INSERTION / PLACEMENT PLEURAL CATHETER    . KNEE SURGERY Left   . PARTIAL HYSTERECTOMY    . TOOTH EXTRACTION    . TUMOR REMOVAL  2012   benign neck tumor removed    Family History  Problem Relation Age of Onset  . Heart disease Mother   . Lung cancer Brother     smoked  . Bladder Cancer Brother   . Stomach cancer Brother     Social History Social History  Substance Use Topics  . Smoking status: Former Smoker    Packs/day: 0.25    Years: 7.00    Types: Cigarettes    Quit date: 01/25/1988  . Smokeless tobacco: Never Used  . Alcohol use Yes     Comment: drinks beer    Allergies  Allergen Reactions  . Clindamycin Shortness Of Breath  . Moxifloxacin Shortness Of Breath    hallucinations  . Prednisone     Hallucinations  . Codeine Other (See Comments)    unknown  . Hydrocodone Other (See Comments)  . Penicillins Itching and Rash    Has patient had a PCN reaction causing immediate rash, facial/tongue/throat swelling, SOB or lightheadedness with hypotension: Yes Has patient had a PCN reaction causing severe rash involving mucus membranes or skin necrosis: No Has patient had a PCN reaction that required hospitalization No Has  patient had a PCN reaction occurring within the last 10 years: No If all of the above answers are "NO", then may proceed with Cephalosporin use.    . Sulfa Antibiotics Other (See Comments)    Unknown  . Unasyn [Ampicillin-Sulbactam Sodium] Other (See Comments)    unknown    Current Outpatient Prescriptions  Medication Sig Dispense Refill  . ADVAIR DISKUS 250-50 MCG/DOSE AEPB Inhale 1 puff into the lungs 2 (two) times daily.  4  . albuterol (PROVENTIL HFA;VENTOLIN HFA) 108 (90 Base) MCG/ACT inhaler Inhale 1-2 puffs into the lungs every 4 (four) hours as needed for wheezing or shortness of breath. 1 Inhaler 2  . ALPRAZolam (XANAX) 0.25 MG tablet Take 1 tablet (0.25 mg total) by mouth every 6 (six) hours as needed. 30 tablet 1  . benzonatate  (TESSALON) 100 MG capsule TAKE 1 CAPSULE(100 MG) BY MOUTH THREE TIMES DAILY AS NEEDED FOR COUGH 30 capsule 6  . budesonide (PULMICORT) 0.5 MG/2ML nebulizer solution USE 1 VIAL VIA NEBULIZER TWICE DAILY 360 mL 6  . cyanocobalamin 500 MCG tablet Take 1,000 mcg by mouth daily.    Marland Kitchen DOK 100 MG capsule TAKE 1 CAPSULE(100 MG) BY MOUTH TWICE DAILY 60 capsule 0  . feeding supplement, ENSURE ENLIVE, (ENSURE ENLIVE) LIQD Take 237 mLs by mouth 3 (three) times daily between meals. 237 mL 12  . fluticasone (FLONASE) 50 MCG/ACT nasal spray Place 1 spray into both nostrils daily. (Patient taking differently: Place 2 sprays into both nostrils daily. ) 16 g 6  . formoterol (PERFOROMIST) 20 MCG/2ML nebulizer solution Take 2 mLs (20 mcg total) by nebulization 2 (two) times daily. DX: chronic bronchitis DX Code: J42 120 mL 6  . ipratropium-albuterol (DUONEB) 0.5-2.5 (3) MG/3ML SOLN TAKE 3 MLS BY NEBULIZATION EVERY 4 HOURS DX: Chronic Bronchitis DX Code: J42 1620 mL 2  . lidocaine-prilocaine (EMLA) cream APPLY TOPICALLY AS NEEDED. 30 g 0  . meclizine (ANTIVERT) 12.5 MG tablet Take 12.5 mg by mouth 3 (three) times daily as needed.    . megestrol (MEGACE) 40 MG/ML suspension SHAKE LIQUID AND TAKE 10 ML(400 MG) BY MOUTH DAILY 240 mL 0  . Multiple Vitamin (MULTIVITAMIN WITH MINERALS) TABS tablet Take 1 tablet by mouth daily.    . mupirocin ointment (BACTROBAN) 2 % Apply 1 application topically 3 (three) times daily. 22 g 0  . nitroGLYCERIN (NITROSTAT) 0.4 MG SL tablet Place 0.4 mg under the tongue every 5 (five) minutes x 3 doses as needed for chest pain. Reported on 07/15/2015    . pantoprazole (PROTONIX) 40 MG tablet Take 40 mg by mouth daily.    . polyethylene glycol powder (GLYCOLAX/MIRALAX) powder Take 17 g by mouth daily as needed. 3350 g 0  . Respiratory Therapy Supplies (FLUTTER) DEVI Use as directed 1 each 0  . SLOW-MAG 71.5-119 MG TBEC SR tablet TAKE 1 TABLET (64 MG TOTAL) BY MOUTH 2 (TWO) TIMES DAILY. 60 tablet 3   . terbinafine (LAMISIL AT) 1 % cream Apply 1 application topically 3 (three) times daily. 30 g 3  . traMADol (ULTRAM) 50 MG tablet Take 1 tablet by mouth every 4-6 hours as needed for pain 90 tablet 0   No current facility-administered medications for this visit.    Facility-Administered Medications Ordered in Other Visits  Medication Dose Route Frequency Provider Last Rate Last Dose  . heparin lock flush 100 unit/mL  500 Units Intravenous Once Forest Gleason, MD      . sodium chloride 0.9 % injection 10  mL  10 mL Intravenous PRN Forest Gleason, MD   10 mL at 07/24/14 1020  . sodium chloride 0.9 % injection 10 mL  10 mL Intravenous PRN Forest Gleason, MD   10 mL at 08/12/14 [id number]      Review of Systems A complete review of systems was asked and was negative except for the following positive findingsCough, shortness of breath.  Blood pressure (!) 161/74, pulse 85, temperature 98.4 F (36.9 C), temperature source Oral, resp. rate (!) 26, height 5\' 1"  (1.549 m), weight 121 lb 3.2 oz (55 kg), SpO2 94 %.  Physical Exam CONSTITUTIONAL:  Pleasant, well-developed, well-nourished, and in no acute distress. EYES: Pupils equal and reactive to light, Sclera non-icteric EARS, NOSE, MOUTH AND THROAT:  The oropharynx was clear.  Dentition is good repair.  Oral mucosa pink and moist. LYMPH NODES:  Lymph nodes in the neck and axillae were normal RESPIRATORY:  Lungs were decreased bilaterally and with some dry rales at the base..  Normal respiratory effort without pathologic use of accessory muscles of respiration CARDIOVASCULAR: Heart was regular without murmurs.  There were no carotid bruits. GI: The abdomen was soft, nontender, and nondistended. There were no palpable masses. There was no hepatosplenomegaly. There were normal bowel sounds in all quadrants. GU:  Rectal deferred.   MUSCULOSKELETAL:  Normal muscle strength and tone.  No clubbing or cyanosis.   SKIN:  There were no pathologic skin lesions.   There were no nodules on palpation. NEUROLOGIC:  Sensation is normal.  Cranial nerves are grossly intact. PSYCH:  Oriented to person, place and time.  Mood and affect are normal.  The Pleurx catheter site is clean and dry. I can palpate the cuff about 2 cm from the skin entrance site.  Data Reviewed Chest x-ray  I have personally reviewed the patient's imaging, laboratory findings and medical records.    Assessment    I have had a discussion with the patient and her caregivers about removing the catheter. Risks of bleeding and infection were reviewed. They understand that once the catheters out and may make it very difficult to put back in.    Plan    We will go ahead and schedule the patient for catheters removal. We will go ahead and check her preoperative labs today.       Nestor Lewandowsky, MD 03/25/2016, 9:49 AM

## 2016-03-25 NOTE — Telephone Encounter (Signed)
Patient to have Pleurx Catheter removed on 03/28/16 by Dr. Genevive Bi at [facility].  Patient seen in office by Dr. Genevive Bi on 03/25/16 and was taken directly to Pre-admission testing to have this completed. She is aware of all surgery information and will call [phone] between 1-3pm today to get her arrival time.

## 2016-03-25 NOTE — Pre-Procedure Instructions (Signed)
Spoke to Dr Rosey Bath regarding patient recent EKG and cardiac history, admission to [facility] in 2016.

## 2016-03-25 NOTE — Telephone Encounter (Signed)
NO authorization required [place] for CPT code [id number]. Patient has part A and B Medicare.

## 2016-03-27 MED ORDER — VANCOMYCIN HCL 500 MG IV SOLR
500.0000 mg | INTRAVENOUS | Status: AC
  Administered 2016-03-28: 500 mg via INTRAVENOUS
  Filled 2016-03-27: qty 500

## 2016-03-28 ENCOUNTER — Encounter: Payer: Self-pay | Admitting: *Deleted

## 2016-03-28 ENCOUNTER — Ambulatory Visit: Payer: Medicare Other | Admitting: Registered Nurse

## 2016-03-28 ENCOUNTER — Encounter
Admission: RE | Disposition: A | Discharge status: Home / Self Care | Payer: Self-pay | Source: Ambulatory Visit | Attending: Cardiothoracic Surgery

## 2016-03-28 ENCOUNTER — Ambulatory Visit
Admission: RE | Admit: 2016-03-28 | Discharge: 2016-03-28 | Disposition: A | Discharge status: Home / Self Care | Payer: Medicare Other | Source: Ambulatory Visit | Attending: Cardiothoracic Surgery | Admitting: Cardiothoracic Surgery

## 2016-03-28 DIAGNOSIS — I509 Heart failure, unspecified: Secondary | ICD-10-CM | POA: Diagnosis not present

## 2016-03-28 DIAGNOSIS — I2510 Atherosclerotic heart disease of native coronary artery without angina pectoris: Secondary | ICD-10-CM | POA: Diagnosis not present

## 2016-03-28 DIAGNOSIS — F329 Major depressive disorder, single episode, unspecified: Secondary | ICD-10-CM | POA: Insufficient documentation

## 2016-03-28 DIAGNOSIS — J90 Pleural effusion, not elsewhere classified: Secondary | ICD-10-CM | POA: Diagnosis not present

## 2016-03-28 DIAGNOSIS — F419 Anxiety disorder, unspecified: Secondary | ICD-10-CM | POA: Insufficient documentation

## 2016-03-28 DIAGNOSIS — K219 Gastro-esophageal reflux disease without esophagitis: Secondary | ICD-10-CM | POA: Insufficient documentation

## 2016-03-28 DIAGNOSIS — Z4682 Encounter for fitting and adjustment of non-vascular catheter: Secondary | ICD-10-CM | POA: Diagnosis not present

## 2016-03-28 DIAGNOSIS — I110 Hypertensive heart disease with heart failure: Secondary | ICD-10-CM | POA: Diagnosis not present

## 2016-03-28 HISTORY — PX: CHEST TUBE INSERTION: SHX231

## 2016-03-28 LAB — URINALYSIS, COMPLETE (UACMP) WITH MICROSCOPIC
BILIRUBIN URINE: NEGATIVE
Bacteria, UA: NONE SEEN
GLUCOSE, UA: NEGATIVE mg/dL
HGB URINE DIPSTICK: NEGATIVE
Ketones, ur: NEGATIVE mg/dL
Leukocytes, UA: NEGATIVE
NITRITE: NEGATIVE
PH: 6 (ref 5.0–8.0)
Protein, ur: NEGATIVE mg/dL
Specific Gravity, Urine: 1.009 (ref 1.005–1.030)
Squamous Epithelial / LPF: NONE SEEN

## 2016-03-28 LAB — GLUCOSE, CAPILLARY
GLUCOSE-CAPILLARY: 115 mg/dL — AB (ref 65–99)
Glucose-Capillary: 123 mg/dL — ABNORMAL HIGH (ref 65–99)

## 2016-03-28 SURGERY — CHEST TUBE INSERTION
Anesthesia: Monitor Anesthesia Care | Laterality: Right | Wound class: Clean Contaminated

## 2016-03-28 MED ORDER — LIDOCAINE HCL (PF) 1 % IJ SOLN
INTRAMUSCULAR | Status: DC | PRN
  Administered 2016-03-28: 8 mL

## 2016-03-28 MED ORDER — FENTANYL CITRATE (PF) 100 MCG/2ML IJ SOLN
INTRAMUSCULAR | Status: AC
  Filled 2016-03-28: qty 2

## 2016-03-28 MED ORDER — SODIUM CHLORIDE 0.9 % IV SOLN
INTRAVENOUS | Status: DC
  Administered 2016-03-28: 11:00:00 via INTRAVENOUS

## 2016-03-28 MED ORDER — FENTANYL CITRATE (PF) 100 MCG/2ML IJ SOLN
INTRAMUSCULAR | Status: DC | PRN
  Administered 2016-03-28 (×3): 25 ug via INTRAVENOUS

## 2016-03-28 MED ORDER — LIDOCAINE HCL (PF) 1 % IJ SOLN
INTRAMUSCULAR | Status: AC
  Filled 2016-03-28: qty 30

## 2016-03-28 MED ORDER — MIDAZOLAM HCL 2 MG/2ML IJ SOLN
INTRAMUSCULAR | Status: AC
  Filled 2016-03-28: qty 2

## 2016-03-28 MED ORDER — MIDAZOLAM HCL 2 MG/2ML IJ SOLN
INTRAMUSCULAR | Status: DC | PRN
  Administered 2016-03-28: 1 mg via INTRAVENOUS

## 2016-03-28 SURGICAL SUPPLY — 36 items
BLADE SURG 15 STRL LF DISP TIS (BLADE) IMPLANT
BLADE SURG 15 STRL SS (BLADE) ×2
BLADE SURG SZ11 CARB STEEL (BLADE) ×2 IMPLANT
CANISTER SUCT 1200ML W/VALVE (MISCELLANEOUS) ×2 IMPLANT
CHLORAPREP W/TINT 26ML (MISCELLANEOUS) ×2 IMPLANT
DRAIN CHEST DRY SUCT SGL (MISCELLANEOUS) ×1 IMPLANT
DRAPE INCISE IOBAN 66X60 STRL (DRAPES) ×1 IMPLANT
DRAPE LAPAROTOMY 77X122 PED (DRAPES) ×2 IMPLANT
DRSG OPSITE POSTOP 3X4 (GAUZE/BANDAGES/DRESSINGS) ×1 IMPLANT
ELECT REM PT RETURN 9FT ADLT (ELECTROSURGICAL) ×2
ELECTRODE REM PT RTRN 9FT ADLT (ELECTROSURGICAL) ×1 IMPLANT
GLOVE SURG SYN 7.5  E (GLOVE) ×5
GLOVE SURG SYN 7.5 E (GLOVE) ×5 IMPLANT
GLOVE SURG SYN 7.5 PF PI (GLOVE) ×1 IMPLANT
GOWN STRL REUS W/ TWL LRG LVL3 (GOWN DISPOSABLE) ×2 IMPLANT
GOWN STRL REUS W/TWL LRG LVL3 (GOWN DISPOSABLE) ×6
KIT PLEURX DRAIN CATH 15.5FR (DRAIN) ×1 IMPLANT
KIT RM TURNOVER STRD PROC AR (KITS) ×2 IMPLANT
LABEL OR SOLS (LABEL) ×2 IMPLANT
MARKER SKIN DUAL TIP RULER LAB (MISCELLANEOUS) ×1 IMPLANT
NDL HYPO 25X1 1.5 SAFETY (NEEDLE) IMPLANT
NEEDLE HYPO 25X1 1.5 SAFETY (NEEDLE) ×2 IMPLANT
PACK BASIN MINOR ARMC (MISCELLANEOUS) ×2 IMPLANT
SUT ETHILON 3-0 FS-10 30 BLK (SUTURE)
SUT ETHILON 4-0 (SUTURE) ×2
SUT ETHILON 4-0 FS2 18XMFL BLK (SUTURE) ×1
SUT SILK 1 SH (SUTURE) ×1 IMPLANT
SUT VIC AB 0 SH 27 (SUTURE) ×1 IMPLANT
SUT VIC AB 2-0 SH 27 (SUTURE)
SUT VIC AB 2-0 SH 27XBRD (SUTURE) ×1 IMPLANT
SUT VIC AB 3-0 SH 27 (SUTURE) ×2
SUT VIC AB 3-0 SH 27X BRD (SUTURE) IMPLANT
SUTURE EHLN 3-0 FS-10 30 BLK (SUTURE) ×1 IMPLANT
SUTURE ETHLN 4-0 FS2 18XMF BLK (SUTURE) IMPLANT
SYR 10ML LL (SYRINGE) ×1 IMPLANT
SYR 30ML LL (SYRINGE) ×2 IMPLANT

## 2016-03-28 NOTE — Anesthesia Procedure Notes (Signed)
Procedure Name: MAC Date/Time: 03/28/2016 11:33 AM Performed by: Hedda Slade Pre-anesthesia Checklist: Patient identified, Emergency Drugs available, Suction available and Patient being monitored Oxygen Delivery Method: Nasal cannula

## 2016-03-28 NOTE — Anesthesia Preprocedure Evaluation (Signed)
Anesthesia Evaluation  Patient identified by MRN, date of birth, ID band Patient awake    Reviewed: Allergy & Precautions, H&P , NPO status , Patient's Chart, lab work & pertinent test results, reviewed documented beta blocker date and time   History of Anesthesia Complications (+) Family history of anesthesia reaction  Airway Mallampati: II  TM Distance: >3 FB Neck ROM: full    Dental no notable dental hx. (+) Teeth Intact   Pulmonary neg pulmonary ROS, shortness of breath and with exertion, pneumonia, former smoker,  Baseline cough, persistent and nonproductive.     Pulmonary exam normal breath sounds clear to auscultation       Cardiovascular Exercise Tolerance: Good hypertension, + angina + CAD and +CHF  negative cardio ROS  + Valvular Problems/Murmurs  Rhythm:regular Rate:Normal     Neuro/Psych  Headaches, PSYCHIATRIC DISORDERS Anxiety Depression negative neurological ROS  negative psych ROS   GI/Hepatic negative GI ROS, Neg liver ROS, GERD  ,  Endo/Other  negative endocrine ROSdiabetes  Renal/GU      Musculoskeletal   Abdominal   Peds  Hematology negative hematology ROS (+)   Anesthesia Other Findings   Reproductive/Obstetrics negative OB ROS                             Anesthesia Physical Anesthesia Plan  ASA: III  Anesthesia Plan: MAC   Post-op Pain Management:    Induction:   Airway Management Planned:   Additional Equipment:   Intra-op Plan:   Post-operative Plan:   Informed Consent: I have reviewed the patients History and Physical, chart, labs and discussed the procedure including the risks, benefits and alternatives for the proposed anesthesia with the patient or authorized representative who has indicated his/her understanding and acceptance.     Plan Discussed with: CRNA  Anesthesia Plan Comments:         Anesthesia Quick Evaluation

## 2016-03-28 NOTE — Discharge Instructions (Signed)
AMBULATORY SURGERY  DISCHARGE INSTRUCTIONS   1) The drugs that you were given will stay in your system until tomorrow so for the next 24 hours you should not:  A) Drive an automobile B) Make any legal decisions C) Drink any alcoholic beverage   2) You may resume regular meals tomorrow.  Today it is better to start with liquids and gradually work up to solid foods.  You may eat anything you prefer, but it is better to start with liquids, then soup and crackers, and gradually work up to solid foods.   3) Please notify your doctor immediately if you have any unusual bleeding, trouble breathing, redness and pain at the surgery site, drainage, fever, or pain not relieved by medication.    4) Additional Instructions:  Call MD for sudden shortness of breath. Please contact your physician with any problems or Same Day Surgery at [phone], Monday through Friday 6 am to 4 pm, or [facility] number at [phone].

## 2016-03-28 NOTE — Op Note (Signed)
  03/28/2016  12:41 PM  PATIENT:  Caroline Russo  83 y.o. female  PRE-OPERATIVE DIAGNOSIS:  Nonfunctioning PleurX  POST-OPERATIVE DIAGNOSIS:  same  PROCEDURE:  Removal of PleurX catheter  SURGEON:  Surgeon(s) and Role:    * Nestor Lewandowsky, MD - Primary  ASSISTANTS: Arvil [place] PAS  ANESTHESIA: Local  INDICATIONS FOR PROCEDURE  Nonfunctioning PleurX catheter.  Her Pleurx catheter has not been drained in several months. She recently had a chest x-ray done which did not reveal any pleural fluid and therefore she desired to have it taken out. She was complaining of significant discomfort with the management of this catheter.  DICTATION:n  the patient was brought to the operating suite and placed in the supine position. She was prepped and draped in usual sterile fashion. Using 1% lidocaine as local anesthetic a small skin wheal was raised over the back chronic cuff of the catheter. A skin incision was made the length of the tach on cuff and blunt and sharp dissection was used until the cuff was identified. The catheter was then removed from the chest and it was then cut at the skin level. It was then dissected from the surrounding tissues. The wounds were hemostatic and they were closed with interrupted figure-of-eight Vicryl sutures. The skin was closed with a running nylon. Sterile dressings were applied.   Nestor Lewandowsky, MD

## 2016-03-28 NOTE — Transfer of Care (Signed)
Immediate Anesthesia Transfer of Care Note  Patient: Caroline Russo  Procedure(s) Performed: Procedure(s): PLEURX CATHETER REMOVAL (Right)  Patient Location: PACU  Anesthesia Type:MAC  Level of Consciousness: awake, alert  and oriented  Airway & Oxygen Therapy: Patient Spontanous Breathing  Post-op Assessment: Report given to RN and Post -op Vital signs reviewed and stable  Post vital signs: Reviewed and stable  Last Vitals:  Vitals:   03/28/16 1004 03/28/16 1232  BP: (!) 158/66 (!) 147/74  Pulse: (!) 114 (!) 104  Resp: (!) 22 19  Temp: 37.3 C 36.9 C    Last Pain:  Vitals:   03/28/16 1004  TempSrc: Oral  PainSc: 4          Complications: No apparent anesthesia complications

## 2016-03-28 NOTE — Anesthesia Post-op Follow-up Note (Cosign Needed)
Anesthesia QCDR form completed.        

## 2016-03-29 NOTE — Anesthesia Postprocedure Evaluation (Signed)
Anesthesia Post Note  Patient: Rochele Raring  Procedure(s) Performed: Procedure(s) (LRB): PLEURX CATHETER REMOVAL (Right)  Patient location during evaluation: PACU Anesthesia Type: MAC Level of consciousness: awake and alert Pain management: pain level controlled Vital Signs Assessment: post-procedure vital signs reviewed and stable Respiratory status: spontaneous breathing, nonlabored ventilation, respiratory function stable and patient connected to nasal cannula oxygen Cardiovascular status: blood pressure returned to baseline and stable Postop Assessment: no signs of nausea or vomiting Anesthetic complications: no     Last Vitals:  Vitals:   03/28/16 1323 03/28/16 1408  BP: (!) 181/62 (!) 151/61  Pulse: 87 (!) 103  Resp: 18 16  Temp: 37.7 C     Last Pain:  Vitals:   03/29/16 0800  TempSrc:   PainSc: 0-No pain                 Molli Barrows

## 2016-04-01 ENCOUNTER — Encounter: Payer: Medicare Other | Admitting: Cardiothoracic Surgery

## 2016-04-01 DIAGNOSIS — R05 Cough: Secondary | ICD-10-CM | POA: Diagnosis not present

## 2016-04-01 DIAGNOSIS — R633 Feeding difficulties: Secondary | ICD-10-CM | POA: Diagnosis not present

## 2016-04-01 DIAGNOSIS — R918 Other nonspecific abnormal finding of lung field: Secondary | ICD-10-CM | POA: Diagnosis not present

## 2016-04-01 DIAGNOSIS — J90 Pleural effusion, not elsewhere classified: Secondary | ICD-10-CM | POA: Diagnosis not present

## 2016-04-01 DIAGNOSIS — D045 Carcinoma in situ of skin of trunk: Secondary | ICD-10-CM | POA: Diagnosis present

## 2016-04-01 DIAGNOSIS — R1310 Dysphagia, unspecified: Secondary | ICD-10-CM | POA: Diagnosis not present

## 2016-04-01 DIAGNOSIS — Z66 Do not resuscitate: Secondary | ICD-10-CM | POA: Diagnosis present

## 2016-04-01 DIAGNOSIS — I491 Atrial premature depolarization: Secondary | ICD-10-CM | POA: Diagnosis not present

## 2016-04-01 DIAGNOSIS — J690 Pneumonitis due to inhalation of food and vomit: Secondary | ICD-10-CM | POA: Diagnosis not present

## 2016-04-01 DIAGNOSIS — Z6822 Body mass index (BMI) 22.0-22.9, adult: Secondary | ICD-10-CM | POA: Diagnosis not present

## 2016-04-01 DIAGNOSIS — J470 Bronchiectasis with acute lower respiratory infection: Secondary | ICD-10-CM | POA: Diagnosis not present

## 2016-04-01 DIAGNOSIS — I110 Hypertensive heart disease with heart failure: Secondary | ICD-10-CM | POA: Diagnosis not present

## 2016-04-01 DIAGNOSIS — Z880 Allergy status to penicillin: Secondary | ICD-10-CM | POA: Diagnosis not present

## 2016-04-01 DIAGNOSIS — Z515 Encounter for palliative care: Secondary | ICD-10-CM | POA: Diagnosis not present

## 2016-04-01 DIAGNOSIS — I5032 Chronic diastolic (congestive) heart failure: Secondary | ICD-10-CM | POA: Diagnosis not present

## 2016-04-01 DIAGNOSIS — D485 Neoplasm of uncertain behavior of skin: Secondary | ICD-10-CM | POA: Diagnosis not present

## 2016-04-01 DIAGNOSIS — L304 Erythema intertrigo: Secondary | ICD-10-CM | POA: Diagnosis present

## 2016-04-01 DIAGNOSIS — Z87891 Personal history of nicotine dependence: Secondary | ICD-10-CM | POA: Diagnosis not present

## 2016-04-01 DIAGNOSIS — R0602 Shortness of breath: Secondary | ICD-10-CM | POA: Diagnosis not present

## 2016-04-01 DIAGNOSIS — C8213 Follicular lymphoma grade II, intra-abdominal lymph nodes: Secondary | ICD-10-CM | POA: Diagnosis present

## 2016-04-01 DIAGNOSIS — Z9981 Dependence on supplemental oxygen: Secondary | ICD-10-CM | POA: Diagnosis not present

## 2016-04-01 DIAGNOSIS — J42 Unspecified chronic bronchitis: Secondary | ICD-10-CM | POA: Diagnosis present

## 2016-04-01 DIAGNOSIS — I10 Essential (primary) hypertension: Secondary | ICD-10-CM | POA: Diagnosis not present

## 2016-04-01 DIAGNOSIS — J984 Other disorders of lung: Secondary | ICD-10-CM | POA: Diagnosis not present

## 2016-04-01 DIAGNOSIS — L989 Disorder of the skin and subcutaneous tissue, unspecified: Secondary | ICD-10-CM | POA: Diagnosis not present

## 2016-04-01 DIAGNOSIS — E119 Type 2 diabetes mellitus without complications: Secondary | ICD-10-CM | POA: Diagnosis not present

## 2016-04-04 ENCOUNTER — Other Ambulatory Visit: Payer: Self-pay

## 2016-04-04 DIAGNOSIS — J90 Pleural effusion, not elsewhere classified: Secondary | ICD-10-CM

## 2016-04-05 ENCOUNTER — Other Ambulatory Visit: Payer: Self-pay | Admitting: Internal Medicine

## 2016-04-08 ENCOUNTER — Encounter: Payer: Medicare Other | Admitting: Cardiothoracic Surgery

## 2016-04-12 DIAGNOSIS — C8213 Follicular lymphoma grade II, intra-abdominal lymph nodes: Secondary | ICD-10-CM | POA: Diagnosis not present

## 2016-04-12 DIAGNOSIS — R531 Weakness: Secondary | ICD-10-CM | POA: Diagnosis not present

## 2016-04-12 DIAGNOSIS — I5032 Chronic diastolic (congestive) heart failure: Secondary | ICD-10-CM | POA: Diagnosis not present

## 2016-04-12 DIAGNOSIS — Z8701 Personal history of pneumonia (recurrent): Secondary | ICD-10-CM | POA: Diagnosis not present

## 2016-04-15 ENCOUNTER — Ambulatory Visit (INDEPENDENT_AMBULATORY_CARE_PROVIDER_SITE_OTHER): Payer: Medicare Other | Admitting: Cardiothoracic Surgery

## 2016-04-15 ENCOUNTER — Ambulatory Visit
Admission: RE | Admit: 2016-04-15 | Discharge: 2016-04-15 | Disposition: A | Discharge status: Home / Self Care | Payer: Medicare Other | Source: Ambulatory Visit | Attending: Cardiothoracic Surgery | Admitting: Cardiothoracic Surgery

## 2016-04-15 ENCOUNTER — Encounter: Payer: Self-pay | Admitting: Cardiothoracic Surgery

## 2016-04-15 ENCOUNTER — Other Ambulatory Visit: Payer: Self-pay

## 2016-04-15 ENCOUNTER — Other Ambulatory Visit: Payer: Self-pay | Admitting: Internal Medicine

## 2016-04-15 VITALS — BP 115/70 | HR 70 | Temp 97.9°F

## 2016-04-15 DIAGNOSIS — I700 Atherosclerosis of aorta: Secondary | ICD-10-CM | POA: Insufficient documentation

## 2016-04-15 DIAGNOSIS — J90 Pleural effusion, not elsewhere classified: Secondary | ICD-10-CM

## 2016-04-15 DIAGNOSIS — R531 Weakness: Secondary | ICD-10-CM | POA: Diagnosis not present

## 2016-04-15 DIAGNOSIS — I5032 Chronic diastolic (congestive) heart failure: Secondary | ICD-10-CM | POA: Diagnosis not present

## 2016-04-15 DIAGNOSIS — C8213 Follicular lymphoma grade II, intra-abdominal lymph nodes: Secondary | ICD-10-CM | POA: Diagnosis not present

## 2016-04-15 DIAGNOSIS — Z4682 Encounter for fitting and adjustment of non-vascular catheter: Secondary | ICD-10-CM | POA: Diagnosis not present

## 2016-04-15 DIAGNOSIS — Z8701 Personal history of pneumonia (recurrent): Secondary | ICD-10-CM | POA: Diagnosis not present

## 2016-04-15 NOTE — Patient Instructions (Signed)
Please give us a call in case you have any questions or concerns.  

## 2016-04-15 NOTE — Progress Notes (Signed)
She returns today in follow-up. We did remove her Pleurx catheter and the wounds have all healed. Her sutures were removed. She had no complaints today. No additional follow-up was made.

## 2016-04-19 ENCOUNTER — Telehealth: Payer: Self-pay | Admitting: Cardiovascular Disease

## 2016-04-19 ENCOUNTER — Telehealth: Payer: Self-pay | Admitting: Internal Medicine

## 2016-04-19 DIAGNOSIS — I5032 Chronic diastolic (congestive) heart failure: Secondary | ICD-10-CM | POA: Diagnosis not present

## 2016-04-19 DIAGNOSIS — R531 Weakness: Secondary | ICD-10-CM | POA: Diagnosis not present

## 2016-04-19 DIAGNOSIS — C8213 Follicular lymphoma grade II, intra-abdominal lymph nodes: Secondary | ICD-10-CM | POA: Diagnosis not present

## 2016-04-19 DIAGNOSIS — Z8701 Personal history of pneumonia (recurrent): Secondary | ICD-10-CM | POA: Diagnosis not present

## 2016-04-19 MED ORDER — ALBUTEROL SULFATE (2.5 MG/3ML) 0.083% IN NEBU: 2.5000 mg | INHALATION_SOLUTION | Freq: Four times a day (QID) | RESPIRATORY_TRACT | 2 refills | Status: AC | PRN

## 2016-04-19 NOTE — Telephone Encounter (Signed)
RX sent. Nothing further needed. 

## 2016-04-19 NOTE — Telephone Encounter (Signed)
°*  STAT* If patient is at the pharmacy, call can be transferred to refill team.   1. Which medications need to be refilled? (please list name of each medication and dose if known)  Albuterol 03083%   2. Which pharmacy/location (including street and city if local pharmacy) is medication to be sent to? [facility] in [place]   3. Do they need a 30 day or 90 day supply? 90 day

## 2016-04-19 NOTE — Telephone Encounter (Signed)
Error

## 2016-04-20 DIAGNOSIS — C4492 Squamous cell carcinoma of skin, unspecified: Secondary | ICD-10-CM | POA: Diagnosis not present

## 2016-04-20 DIAGNOSIS — Z8701 Personal history of pneumonia (recurrent): Secondary | ICD-10-CM | POA: Diagnosis not present

## 2016-04-20 DIAGNOSIS — C8213 Follicular lymphoma grade II, intra-abdominal lymph nodes: Secondary | ICD-10-CM | POA: Diagnosis not present

## 2016-04-20 DIAGNOSIS — R531 Weakness: Secondary | ICD-10-CM | POA: Diagnosis not present

## 2016-04-20 DIAGNOSIS — I5032 Chronic diastolic (congestive) heart failure: Secondary | ICD-10-CM | POA: Diagnosis not present

## 2016-04-20 DIAGNOSIS — D045 Carcinoma in situ of skin of trunk: Secondary | ICD-10-CM | POA: Diagnosis not present

## 2016-04-25 ENCOUNTER — Other Ambulatory Visit: Payer: Self-pay | Admitting: Oncology

## 2016-04-25 ENCOUNTER — Inpatient Hospital Stay: Payer: Medicare Other | Attending: Internal Medicine

## 2016-04-25 ENCOUNTER — Inpatient Hospital Stay (HOSPITAL_BASED_OUTPATIENT_CLINIC_OR_DEPARTMENT_OTHER): Payer: Medicare Other | Admitting: Internal Medicine

## 2016-04-25 ENCOUNTER — Other Ambulatory Visit: Payer: Self-pay | Admitting: Internal Medicine

## 2016-04-25 ENCOUNTER — Telehealth: Payer: Self-pay | Admitting: *Deleted

## 2016-04-25 VITALS — BP 138/73 | HR 92 | Temp 97.5°F | Ht 61.0 in | Wt 120.0 lb

## 2016-04-25 DIAGNOSIS — Z8701 Personal history of pneumonia (recurrent): Secondary | ICD-10-CM | POA: Insufficient documentation

## 2016-04-25 DIAGNOSIS — Z87891 Personal history of nicotine dependence: Secondary | ICD-10-CM | POA: Diagnosis not present

## 2016-04-25 DIAGNOSIS — R05 Cough: Secondary | ICD-10-CM | POA: Insufficient documentation

## 2016-04-25 DIAGNOSIS — E119 Type 2 diabetes mellitus without complications: Secondary | ICD-10-CM | POA: Diagnosis not present

## 2016-04-25 DIAGNOSIS — Z8052 Family history of malignant neoplasm of bladder: Secondary | ICD-10-CM | POA: Diagnosis not present

## 2016-04-25 DIAGNOSIS — C8213 Follicular lymphoma grade II, intra-abdominal lymph nodes: Secondary | ICD-10-CM | POA: Diagnosis not present

## 2016-04-25 DIAGNOSIS — F419 Anxiety disorder, unspecified: Secondary | ICD-10-CM | POA: Diagnosis not present

## 2016-04-25 DIAGNOSIS — J90 Pleural effusion, not elsewhere classified: Secondary | ICD-10-CM | POA: Diagnosis not present

## 2016-04-25 DIAGNOSIS — E7800 Pure hypercholesterolemia, unspecified: Secondary | ICD-10-CM | POA: Insufficient documentation

## 2016-04-25 DIAGNOSIS — I951 Orthostatic hypotension: Secondary | ICD-10-CM | POA: Insufficient documentation

## 2016-04-25 DIAGNOSIS — Z79899 Other long term (current) drug therapy: Secondary | ICD-10-CM | POA: Insufficient documentation

## 2016-04-25 DIAGNOSIS — Z801 Family history of malignant neoplasm of trachea, bronchus and lung: Secondary | ICD-10-CM | POA: Insufficient documentation

## 2016-04-25 DIAGNOSIS — R000 Tachycardia, unspecified: Secondary | ICD-10-CM

## 2016-04-25 DIAGNOSIS — K219 Gastro-esophageal reflux disease without esophagitis: Secondary | ICD-10-CM | POA: Diagnosis not present

## 2016-04-25 DIAGNOSIS — I10 Essential (primary) hypertension: Secondary | ICD-10-CM | POA: Insufficient documentation

## 2016-04-25 DIAGNOSIS — R079 Chest pain, unspecified: Secondary | ICD-10-CM | POA: Insufficient documentation

## 2016-04-25 DIAGNOSIS — F329 Major depressive disorder, single episode, unspecified: Secondary | ICD-10-CM | POA: Diagnosis not present

## 2016-04-25 DIAGNOSIS — H3530 Unspecified macular degeneration: Secondary | ICD-10-CM

## 2016-04-25 DIAGNOSIS — R630 Anorexia: Secondary | ICD-10-CM | POA: Insufficient documentation

## 2016-04-25 LAB — COMPREHENSIVE METABOLIC PANEL
ALBUMIN: 3.3 g/dL — AB (ref 3.5–5.0)
ALK PHOS: 68 U/L (ref 38–126)
ALT: 11 U/L — AB (ref 14–54)
ANION GAP: 10 (ref 5–15)
AST: 21 U/L (ref 15–41)
BILIRUBIN TOTAL: 0.5 mg/dL (ref 0.3–1.2)
BUN: 12 mg/dL (ref 6–20)
CALCIUM: 8.8 mg/dL — AB (ref 8.9–10.3)
CO2: 21 mmol/L — AB (ref 22–32)
CREATININE: 0.93 mg/dL (ref 0.44–1.00)
Chloride: 106 mmol/L (ref 101–111)
GFR, EST AFRICAN AMERICAN: 59 mL/min — AB (ref >60)
GFR, EST NON AFRICAN AMERICAN: 51 mL/min — AB (ref >60)
GLUCOSE: 181 mg/dL — AB (ref 65–99)
Potassium: 3.7 mmol/L (ref 3.5–5.1)
SODIUM: 137 mmol/L (ref 135–145)
Total Protein: 6.9 g/dL (ref 6.5–8.1)

## 2016-04-25 LAB — CBC WITH DIFFERENTIAL/PLATELET
BASOS ABS: 0.1 10*3/uL (ref 0–0.1)
BASOS PCT: 1 %
EOS ABS: 0.3 10*3/uL (ref 0–0.7)
Eosinophils Relative: 5 %
HCT: 37.3 % (ref 35.0–47.0)
HEMOGLOBIN: 12.3 g/dL (ref 12.0–16.0)
Lymphocytes Relative: 20 %
Lymphs Abs: 1.2 10*3/uL (ref 1.0–3.6)
MCH: 26.2 pg (ref 26.0–34.0)
MCHC: 32.8 g/dL (ref 32.0–36.0)
MCV: 79.8 fL — ABNORMAL LOW (ref 80.0–100.0)
MONOS PCT: 8 %
Monocytes Absolute: 0.5 10*3/uL (ref 0.2–0.9)
NEUTROS ABS: 3.8 10*3/uL (ref 1.4–6.5)
NEUTROS PCT: 66 %
Platelets: 277 10*3/uL (ref 150–440)
RBC: 4.68 MIL/uL (ref 3.80–5.20)
RDW: 19.2 % — ABNORMAL HIGH (ref 11.5–14.5)
WBC: 5.9 10*3/uL (ref 3.6–11.0)

## 2016-04-25 LAB — LACTATE DEHYDROGENASE: LDH: 486 U/L — ABNORMAL HIGH (ref 98–192)

## 2016-04-25 LAB — MAGNESIUM: Magnesium: 1.3 mg/dL — ABNORMAL LOW (ref 1.7–2.4)

## 2016-04-25 NOTE — Progress Notes (Signed)
[place] OFFICE PROGRESS NOTE  Patient Care Team: Arlis Porta., MD as PCP - General (Unknown Physician Specialty) Minna Merritts, MD as Consulting Physician (Cardiology) Cammie Sickle, MD as Consulting Physician (Oncology)  No matching staging information was found for the patient.   Oncology History   # 2015- Biopsy of para-aortic lymph node is consistent with follicular lymphoma, clinically stage as 3A Diagnosis in July of 2015. Started on rituximab on July 31, 2013 2. 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  female [place] patient above history of Recurrent follicular lymphoma/ also a right pleural effusion presumed from lymphoma; Also history of multifocal pneumonia is here for follow-up.  In the interim unfortunately patient had admission to the hospital [facility] "double pneumonia". Treated with antibiotics. Clinically seems to be improving. Appetite improving. No gaining weight. As per the daughter patient's oxygen levels at home was 98 yesterday.  However patient continues to chronic mild cough. Not insignificant worse. In the interim, patient also had her Pleurx catheter taken out. Improvement in pain.  Her appetite is improving onMarinol. No nausea no vomiting.   Patient denies any fevers. Denies any nausea vomiting.No blood. Patient is walking herself with a cane. 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 Patient and daughter are not interested in further chemotherapy.  REVIEW OF SYSTEMS:  A complete 10 point review of system is done which is negative except mentioned above/history of present illness.    PAST MEDICAL HISTORY :  Past Medical History:  Diagnosis Date  . Anginal pain ([place])   . Anxiety   . Cataract   . Chronic chest pain    a. nonobs cath 2002.  Marland Kitchen Decreased appetite   . Depression   . DM2 (diabetes mellitus, type 2) (HCC)    insulin requiring  . Family history of adverse reaction to anesthesia    grandaughter had symptoms of malignant hyperthermia  . GERD (gastroesophageal reflux disease)   . Heart murmur    a. 09/2014 Echo: EF 55-60%, nl wm, mod MS, mild MR, mod dil LA, mild-mod TR.  Marland Kitchen HTN (hypertension)   . Hypercholesterolemia   . Hypomagnesemia 06/13/2014  . Irregular cardiac rhythm   . Macular degeneration   . Murmur    hx  . Non Hodgkin's lymphoma ([place]) 2015   On monthly Rituxan infusions  . Non-obstructive CAD   . Orthostatic hypotension   . Palpitations    a. 01/2011 Holter monitor showed frequent PACs, sinus bradycardia  . Pneumonia June 2017 and Oct 2017  . Tinnitus    chronic    PAST SURGICAL HISTORY :   Past Surgical History:  Procedure Laterality Date  . ABDOMINAL HYSTERECTOMY    . ANKLE SURGERY Left   . CARDIAC CATHETERIZATION  2002  . CHEST TUBE INSERTION Right 03/28/2016   Procedure: PLEURX CATHETER REMOVAL;  Surgeon: Nestor Lewandowsky, MD;  Location: [facility] ORS;  Service: General;  Laterality: Right;  . INSERTION / PLACEMENT PLEURAL CATHETER    . KNEE SURGERY Left   . PARTIAL HYSTERECTOMY    . TOOTH EXTRACTION    .  TUMOR REMOVAL  2012   benign neck tumor removed    FAMILY HISTORY :   Family History  Problem Relation Age of Onset  . Heart disease Mother   . Lung cancer Brother     smoked  . Bladder Cancer Brother   . Stomach cancer Brother     SOCIAL HISTORY:   Social History  Substance Use Topics  . Smoking status: Former Smoker    Packs/day: 0.25    Years: 7.00    Types: Cigarettes    Quit date: 01/25/1988  . Smokeless tobacco: Never Used  . Alcohol use Yes     Comment: drinks beer    ALLERGIES:  is allergic to  clindamycin; moxifloxacin; prednisone; codeine; hydrocodone; penicillins; sulfa antibiotics; and unasyn [ampicillin-sulbactam sodium].  MEDICATIONS:  Current Outpatient Prescriptions  Medication Sig Dispense Refill  . albuterol (PROVENTIL HFA;VENTOLIN HFA) 108 (90 Base) MCG/ACT inhaler Inhale 1-2 puffs into the lungs every 4 (four) hours as needed for wheezing or shortness of breath. 1 Inhaler 2  . albuterol (PROVENTIL) (2.5 MG/3ML) 0.083% nebulizer solution Inhale 3 mLs (2.5 mg total) into the lungs every 6 (six) hours as needed. 360 mL 2  . ALPRAZolam (XANAX) 0.25 MG tablet Take 1 tablet (0.25 mg total) by mouth every 6 (six) hours as needed. 30 tablet 1  . benzonatate (TESSALON) 100 MG capsule TAKE 1 CAPSULE(100 MG) BY MOUTH THREE TIMES DAILY AS NEEDED FOR COUGH 30 capsule 6  . DOK 100 MG capsule TAKE 1 CAPSULE(100 MG) BY MOUTH TWICE DAILY 60 capsule 0  . feeding supplement, ENSURE ENLIVE, (ENSURE ENLIVE) LIQD Take 237 mLs by mouth 3 (three) times daily between meals. 237 mL 12  . fluticasone (FLONASE) 50 MCG/ACT nasal spray Place 1 spray into both nostrils daily. (Patient taking differently: Place 2 sprays into both nostrils daily. ) 16 g 6  . formoterol (PERFOROMIST) 20 MCG/2ML nebulizer solution Take 2 mLs (20 mcg total) by nebulization 2 (two) times daily. DX: chronic bronchitis DX Code: J42 120 mL 6  . guaifenesin (COUGH SYRUP) 100 MG/5ML syrup Take 200 mg by mouth every 4 (four) hours as needed.    Marland Kitchen ipratropium-albuterol (DUONEB) 0.5-2.5 (3) MG/3ML SOLN TAKE 3 MLS BY NEBULIZATION EVERY 4 HOURS DX: Chronic Bronchitis DX Code: J42 1620 mL 2  . lidocaine-prilocaine (EMLA) cream APPLY TOPICALLY AS NEEDED. 30 g 0  . meclizine (ANTIVERT) 12.5 MG tablet Take 12.5 mg by mouth 3 (three) times daily as needed.    . megestrol (MEGACE) 40 MG/ML suspension SHAKE LIQUID AND TAKE 10 ML(400 MG) BY MOUTH DAILY 240 mL 0  . Multiple Vitamin (MULTIVITAMIN WITH MINERALS) TABS tablet Take 1 tablet by mouth  daily.    . mupirocin cream (BACTROBAN) 2 % Apply 1 application topically 3 (three) times daily.    . mupirocin ointment (BACTROBAN) 2 % Apply 1 application topically 3 (three) times daily. 22 g 0  . nitroGLYCERIN (NITROSTAT) 0.4 MG SL tablet Place 0.4 mg under the tongue every 5 (five) minutes x 3 doses as needed for chest pain. Reported on 07/15/2015    . pantoprazole (PROTONIX) 40 MG tablet Take 40 mg by mouth daily.    . pantoprazole (PROTONIX) 40 MG tablet TAKE 1 TABLET(40 MG) BY MOUTH DAILY 30 tablet 0  . polyethylene glycol powder (GLYCOLAX/MIRALAX) powder Take 17 g by mouth daily as needed. 3350 g 0  . Respiratory Therapy Supplies (FLUTTER) DEVI Use as directed 1 each 0  . SLOW-MAG 71.5-119 MG TBEC SR  tablet TAKE 1 TABLET (64 MG TOTAL) BY MOUTH 2 (TWO) TIMES DAILY. 60 tablet 3  . terbinafine (LAMISIL AT) 1 % cream Apply 1 application topically 3 (three) times daily. 30 g 3  . traMADol (ULTRAM) 50 MG tablet Take 1 tablet by mouth every 4-6 hours as needed for pain 90 tablet 0   No current facility-administered medications for this visit.    Facility-Administered Medications Ordered in Other Visits  Medication Dose Route Frequency Provider Last Rate Last Dose  . heparin lock flush 100 unit/mL  500 Units Intravenous Once Forest Gleason, MD      . sodium chloride 0.9 % injection 10 mL  10 mL Intravenous PRN Forest Gleason, MD   10 mL at 07/24/14 1020  . sodium chloride 0.9 % injection 10 mL  10 mL Intravenous PRN Forest Gleason, MD   10 mL at 08/12/14 0834    PHYSICAL EXAMINATION: ECOG PERFORMANCE STATUS: 2 - Symptomatic, <50% confined to bed  BP 138/73 (BP Location: Left Arm, Patient Position: Sitting)   Pulse 92   Temp 97.5 F (36.4 C) (Tympanic)   Ht 5\' 1"  (1.549 m)   Wt 120 lb (54.4 kg)   SpO2 90%   BMI 22.67 kg/m    Filed Weights   04/25/16 1154  Weight: 120 lb (54.4 kg)    GENERAL: [place] Caucasian female patient; Alert, no distress and comfortable.  Patient is in a  wheelchair. Accompanied by her daughter.  EYES: no pallor or icterus OROPHARYNX: no thrush or ulceration; dentures NECK: supple, no masses felt LYMPH:  no palpable lymphadenopathy in the cervical, axillary or inguinal regions LUNGS: Decreased breath sounds on the right side compared to the left  No wheeze or crackles HEART/CVS: regular rate & rhythm and no murmurs; No lower extremity edema ABDOMEN:abdomen soft, non-tender and normal bowel sounds Musculoskeletal:no cyanosis of digits and no clubbing  PSYCH: alert & oriented x 3 with fluent speech NEURO: no focal motor/sensory deficits SKIN:  no rashes or significant lesions; left scapular- a bandaged [recent squamous cell carcinoma resected]  LABORATORY DATA:  I have reviewed the data as listed    Component Value Date/Time   NA 137 04/25/2016 1120   NA 134 (L) 05/15/2014 0855   K 3.7 04/25/2016 1120   K 3.3 (L) 05/15/2014 0855   CL 106 04/25/2016 1120   CL 101 05/15/2014 0855   CO2 21 (L) 04/25/2016 1120   CO2 27 05/15/2014 0855   GLUCOSE 181 (H) 04/25/2016 1120   GLUCOSE 213 (H) 05/15/2014 0855   BUN 12 04/25/2016 1120   BUN 9 05/15/2014 0855   CREATININE 0.93 04/25/2016 1120   CREATININE 0.75 05/15/2014 0855   CREATININE 0.82 04/22/2014   CALCIUM 8.8 (L) 04/25/2016 1120   CALCIUM 8.1 (L) 05/15/2014 0855   PROT 6.9 04/25/2016 1120   PROT 5.3 (L) 05/15/2014 0855   ALBUMIN 3.3 (L) 04/25/2016 1120   ALBUMIN 2.7 (L) 05/15/2014 0855   AST 21 04/25/2016 1120   AST 31 05/15/2014 0855   ALT 11 (L) 04/25/2016 1120   ALT 14 05/15/2014 0855   ALKPHOS 68 04/25/2016 1120   ALKPHOS 104 05/15/2014 0855   BILITOT 0.5 04/25/2016 1120   BILITOT 0.6 05/15/2014 0855   GFRNONAA 51 (L) 04/25/2016 1120   GFRNONAA >60 05/15/2014 0855   GFRAA 59 (L) 04/25/2016 1120   GFRAA >60 05/15/2014 0855    No results found for: SPEP, UPEP  Lab Results  Component Value Date   WBC  5.9 04/25/2016   NEUTROABS 3.8 04/25/2016   HGB 12.3 04/25/2016    HCT 37.3 04/25/2016   MCV 79.8 (L) 04/25/2016   PLT 277 04/25/2016      Chemistry      Component Value Date/Time   NA 137 04/25/2016 1120   NA 134 (L) 05/15/2014 0855   K 3.7 04/25/2016 1120   K 3.3 (L) 05/15/2014 0855   CL 106 04/25/2016 1120   CL 101 05/15/2014 0855   CO2 21 (L) 04/25/2016 1120   CO2 27 05/15/2014 0855   BUN 12 04/25/2016 1120   BUN 9 05/15/2014 0855   CREATININE 0.93 04/25/2016 1120   CREATININE 0.75 05/15/2014 0855   CREATININE 0.82 04/22/2014      Component Value Date/Time   CALCIUM 8.8 (L) 04/25/2016 1120   CALCIUM 8.1 (L) 05/15/2014 0855   ALKPHOS 68 04/25/2016 1120   ALKPHOS 104 05/15/2014 0855   AST 21 04/25/2016 1120   AST 31 05/15/2014 0855   ALT 11 (L) 04/25/2016 1120   ALT 14 05/15/2014 0855   BILITOT 0.5 04/25/2016 1120   BILITOT 0.6 05/15/2014 0855     IMPRESSION: 1. Mild worsening of the right pelvic adenopathy with a new hypermetabolic node and mild increase in hypermetabolic activity of a prior node, currently [place] 5. 2. Several small nodes in the gastrohepatic ligament and porta hepatis are [place] 4. 3. Although improved compared to 11/20/15, there are continued confluent reticulonodular opacities in both lower lobes and in the right upper lobe which are mildly hypermetabolic, favoring atypical infectious bronchiolitis over lymphomatous infiltration of the lung. In addition there is a new sub solid nodule in the left upper lobe which is probably inflammatory and mildly hypermetabolic. A right chest tube is in place and there is a small right pleural effusion. 4. Other imaging findings of potential clinical significance: Chronic ischemic microvascular white matter disease. Atherosclerosis. Acute on chronic bilateral maxillary sinusitis with chronic left sphenoid sinusitis. Small effusion of the left mastoid air cells. Sigmoid diverticulosis. Prominent stool throughout the colon favors constipation.   Electronically  Signed   By: Van Clines M.D.   On: 02/19/2016 14:57  RADIOGRAPHIC STUDIES: I have personally reviewed the radiological images as listed and agreed with the findings in the report. No results found.   ASSESSMENT & PLAN:  Follicular lymphoma grade II of intra-abdominal lymph nodes ([place]) # Follicular lymphoma G-2  currently Rituxan therapy is on hold [last October 2017] because of recent multifocal pneumonia.   # However on further treatment at this time given the multiple infections/ Hospitalization. Long discussion the patient and daughter- that she is a poor candidate for any further treatments especially given the multiple infections. Recommend surveillance at this time.  # Multifocal pneumonia currently resolved. Reviewed the recent note from [facility]/discharge summary in detail.   # Recurrent right-sided pleural effusion- lymphoma versus pneumonia. Currently none. pleurex catheter taken out.  # History of hypomagnesemia. Repeat magnesium level I.3. Recommend continued by mouth Slow-Mag supplementation at home.  # call re: mag/ [phone] [facility]. Follow-up in about 3-4 months/ labs; mag. Currently followed by palliative Care.   Orders Placed This Encounter  Procedures  . CBC with Differential    Standing Status:   Future    Standing Expiration Date:   04/25/2017  . Comprehensive metabolic panel    Standing Status:   Future    Standing Expiration Date:   04/25/2017  . Magnesium    Standing Status:   Future  Standing Expiration Date:   04/25/2017  . Magnesium    Standing Status:   Future    Number of Occurrences:   1    Standing Expiration Date:   04/25/2017      Cammie Sickle, MD 04/25/2016 1:45 PM

## 2016-04-25 NOTE — Telephone Encounter (Signed)
-----   Message from Cammie Sickle, MD sent at 04/25/2016  1:04 PM EDT ----- Please call daughter- that Caroline Russo is low/ same as it was previously; continue slow mag the way she is doing it now; no new changes.

## 2016-04-25 NOTE — Telephone Encounter (Signed)
Spoke daughter-Cheryl. Daughter made aware mag level is stable and has not changed. Continue with oral slow mag as directed. No new changes.

## 2016-04-25 NOTE — Progress Notes (Signed)
Patient here for follow up. Since last seen she has been hospitalized for pnuemonia and has had her pleurex catheter removed. She has seen Dr. Genevive Bi for follow up.  She has a persistent productive cough.She has also had a squamous cell cancer removed from her left shoulder.

## 2016-04-25 NOTE — Assessment & Plan Note (Addendum)
#   Follicular lymphoma G-2  currently Rituxan therapy is on hold [last October 2017] because of recent multifocal pneumonia.   # However on further treatment at this time given the multiple infections/ Hospitalization. Long discussion the patient and daughter- that she is a poor candidate for any further treatments especially given the multiple infections. Recommend surveillance at this time.  # Multifocal pneumonia currently resolved. Reviewed the recent note from [facility]/discharge summary in detail.   # Recurrent right-sided pleural effusion- lymphoma versus pneumonia. Currently none. pleurex catheter taken out.  # History of hypomagnesemia. Repeat magnesium level I.3. Recommend continued by mouth Slow-Mag supplementation at home.  # call re: mag/ [phone] [facility]. Follow-up in about 3-4 months/ labs; mag. Currently followed by palliative Care.

## 2016-04-26 ENCOUNTER — Telehealth: Payer: Self-pay | Admitting: *Deleted

## 2016-04-26 ENCOUNTER — Other Ambulatory Visit: Payer: Self-pay | Admitting: Internal Medicine

## 2016-04-26 DIAGNOSIS — C8213 Follicular lymphoma grade II, intra-abdominal lymph nodes: Secondary | ICD-10-CM | POA: Diagnosis not present

## 2016-04-26 DIAGNOSIS — Z8701 Personal history of pneumonia (recurrent): Secondary | ICD-10-CM | POA: Diagnosis not present

## 2016-04-26 DIAGNOSIS — I5032 Chronic diastolic (congestive) heart failure: Secondary | ICD-10-CM | POA: Diagnosis not present

## 2016-04-26 DIAGNOSIS — R531 Weakness: Secondary | ICD-10-CM | POA: Diagnosis not present

## 2016-04-26 NOTE — Telephone Encounter (Signed)
Patient's daughter called with concerns over her mother's Mg level. She stated that she was called yesterday with results and told that no treatment was needed at this time. She is still concerned and would like Dr. Burlene Arnt to reconsider treatment. Please advise.

## 2016-04-26 NOTE — Telephone Encounter (Signed)
Dr. Rogue Bussing agreed to order 2 grams of IV mag. msg sent to scheduling to contact daughter with an apt for pt.

## 2016-04-27 ENCOUNTER — Inpatient Hospital Stay: Payer: Medicare Other

## 2016-04-27 DIAGNOSIS — F419 Anxiety disorder, unspecified: Secondary | ICD-10-CM | POA: Diagnosis not present

## 2016-04-27 DIAGNOSIS — C8590 Non-Hodgkin lymphoma, unspecified, unspecified site: Secondary | ICD-10-CM

## 2016-04-27 DIAGNOSIS — C8599 Non-Hodgkin lymphoma, unspecified, extranodal and solid organ sites: Secondary | ICD-10-CM

## 2016-04-27 DIAGNOSIS — R05 Cough: Secondary | ICD-10-CM | POA: Diagnosis not present

## 2016-04-27 DIAGNOSIS — C8213 Follicular lymphoma grade II, intra-abdominal lymph nodes: Secondary | ICD-10-CM | POA: Diagnosis not present

## 2016-04-27 DIAGNOSIS — R079 Chest pain, unspecified: Secondary | ICD-10-CM | POA: Diagnosis not present

## 2016-04-27 DIAGNOSIS — J90 Pleural effusion, not elsewhere classified: Secondary | ICD-10-CM | POA: Diagnosis not present

## 2016-04-27 MED ORDER — MAGNESIUM SULFATE 2 GM/50ML IV SOLN
2.0000 g | Freq: Once | INTRAVENOUS | Status: AC
  Administered 2016-04-27: 2 g via INTRAVENOUS
  Filled 2016-04-27: qty 50

## 2016-04-27 MED ORDER — HEPARIN SOD (PORK) LOCK FLUSH 100 UNIT/ML IV SOLN
500.0000 [IU] | Freq: Once | INTRAVENOUS | Status: AC
  Administered 2016-04-27: 500 [IU] via INTRAVENOUS

## 2016-04-27 MED ORDER — HEPARIN SOD (PORK) LOCK FLUSH 100 UNIT/ML IV SOLN
INTRAVENOUS | Status: AC
  Filled 2016-04-27: qty 5

## 2016-04-28 DIAGNOSIS — C8213 Follicular lymphoma grade II, intra-abdominal lymph nodes: Secondary | ICD-10-CM | POA: Diagnosis not present

## 2016-04-28 DIAGNOSIS — I5032 Chronic diastolic (congestive) heart failure: Secondary | ICD-10-CM | POA: Diagnosis not present

## 2016-04-28 DIAGNOSIS — Z8701 Personal history of pneumonia (recurrent): Secondary | ICD-10-CM | POA: Diagnosis not present

## 2016-04-28 DIAGNOSIS — R531 Weakness: Secondary | ICD-10-CM | POA: Diagnosis not present

## 2016-05-02 DIAGNOSIS — E119 Type 2 diabetes mellitus without complications: Secondary | ICD-10-CM | POA: Diagnosis not present

## 2016-05-02 DIAGNOSIS — H353133 Nonexudative age-related macular degeneration, bilateral, advanced atrophic without subfoveal involvement: Secondary | ICD-10-CM | POA: Diagnosis not present

## 2016-05-05 ENCOUNTER — Other Ambulatory Visit: Payer: Self-pay | Admitting: Internal Medicine

## 2016-05-17 ENCOUNTER — Telehealth: Payer: Self-pay | Admitting: *Deleted

## 2016-05-17 ENCOUNTER — Inpatient Hospital Stay: Payer: Medicare Other

## 2016-05-17 ENCOUNTER — Inpatient Hospital Stay: Payer: Medicare Other | Admitting: *Deleted

## 2016-05-17 DIAGNOSIS — R079 Chest pain, unspecified: Secondary | ICD-10-CM | POA: Diagnosis not present

## 2016-05-17 DIAGNOSIS — C8599 Non-Hodgkin lymphoma, unspecified, extranodal and solid organ sites: Secondary | ICD-10-CM

## 2016-05-17 DIAGNOSIS — C8590 Non-Hodgkin lymphoma, unspecified, unspecified site: Secondary | ICD-10-CM

## 2016-05-17 DIAGNOSIS — R05 Cough: Secondary | ICD-10-CM | POA: Diagnosis not present

## 2016-05-17 DIAGNOSIS — C8213 Follicular lymphoma grade II, intra-abdominal lymph nodes: Secondary | ICD-10-CM | POA: Diagnosis not present

## 2016-05-17 DIAGNOSIS — J90 Pleural effusion, not elsewhere classified: Secondary | ICD-10-CM | POA: Diagnosis not present

## 2016-05-17 DIAGNOSIS — F419 Anxiety disorder, unspecified: Secondary | ICD-10-CM | POA: Diagnosis not present

## 2016-05-17 LAB — MAGNESIUM: MAGNESIUM: 1.5 mg/dL — AB (ref 1.7–2.4)

## 2016-05-17 MED ORDER — HEPARIN SOD (PORK) LOCK FLUSH 100 UNIT/ML IV SOLN
INTRAVENOUS | Status: AC
  Filled 2016-05-17: qty 5

## 2016-05-17 MED ORDER — SODIUM CHLORIDE 0.9 % IV SOLN
INTRAVENOUS | Status: DC
  Administered 2016-05-17: 14:00:00 via INTRAVENOUS
  Filled 2016-05-17: qty 1000

## 2016-05-17 MED ORDER — MAGNESIUM SULFATE 2 GM/50ML IV SOLN
2.0000 g | Freq: Once | INTRAVENOUS | Status: AC
  Administered 2016-05-17: 2 g via INTRAVENOUS
  Filled 2016-05-17: qty 50

## 2016-05-17 MED ORDER — HEPARIN SOD (PORK) LOCK FLUSH 100 UNIT/ML IV SOLN
500.0000 [IU] | Freq: Once | INTRAVENOUS | Status: AC
  Administered 2016-05-17: 500 [IU] via INTRAVENOUS

## 2016-05-17 NOTE — Telephone Encounter (Signed)
Asking if she can come in for lab today for Mag check, thinks it is low as she is not eating and is very weak, all signs she gets when her Mg+ is low. Please advise

## 2016-05-17 NOTE — Telephone Encounter (Signed)
Will come in at 1:00 for lab then possible IV Mag inf afterwards

## 2016-05-17 NOTE — Telephone Encounter (Signed)
Per Dr Rogue Bussing:  Faythe Ghee to add pt to the lab schedule to check Magnesium, also add to the schedule for poss IV Mag.

## 2016-05-22 NOTE — Progress Notes (Signed)
Cardiology Office Note  Date:  05/24/2016   ID:  Deasiah, Hagberg [birth year], MRN [id number]  PCP:  Dicky Doe, MD   Chief Complaint  Patient presents with  . OTHER    6 month f/u sob and coughing. Meds reviewed verbally with pt.    HPI:  Caroline Russo is a pleasant 83 year old woman with  Chronic diastolic CHF hypertension,  nonobstructive coronary artery disease in 2002,  diabetes,  obesity  recurrent pleural effusion holter showing short runs atrial tach who presents for routine followup of her coronary artery disease, hypertension   In follow-up today she presents with her daughter Patient has worsening cough, hypoxia over the past week She was recently in the hospital at [facility], records reviewed She had 7 days of Levaquin total Treated for acute on chronic bronchitis it would seem She was doing better for some time and then symptoms recurred now requiring higher levels of oxygen, 4 L with hypoxia.   In the office today maintaining 94% on 4 L Worsening productive cough, otherwise mentating okay   daughter reports that she likely has chronic aspiration  EKG personally reviewed by myself on todays visit Shows sinus tachycardia with rate 111 bpm   other past medical history reviewed on today's visit  squamous cell cancer removed from her left shoulder. admission to the hospital [facility] "double pneumonia". Treated with antibiotics. Pleurx catheter taken out. Improvement in pain. not interested in further chemotherapy. Rituxan therapy is on hold [facility] October 2017  Family with her today, reports that she continues to have thick productive cough,  Difficulty with Advair, holding her breath in No drainage from pleurex cath past few weeks  ECHO:  At [facility]  11/26/2015  Left ventricular hypertrophy - mild  Normal left ventricular systolic function, ejection fraction > 55%  Mitral annular calcification  Degenerative mitral valve disease  Mitral stenosis -  moderate  Dilated left atrium - moderate  Normal right ventricular systolic function  Chronic dizziness,  with previous Blood pressure measurements showing orthostasis.   HCTZ was held in the past, losartan dose was decreased.   Previous Holter monitor showing APCs, PVCs, rare short runs of atrial tachycardia  fall while in the bathroom 06/09/2014, hit her right eye History ofrecurrent pleural effusion, with Pleurx catheter.  Weight continues to trend lower, previously 158 pounds, down to 150 pounds,  130 pounds, now 122 pounds  Previously treated with Rituxan, then Treanda and Rituxan with steroids  In the hospital May 2015 CT scan showed moderate right pleural effusion, diffuse adenopathy CT scan of the head without metastases Hospital records from 11/11/2013 detail diagnosis of follicular lymphoma with chronic right-sided pleural effusion She was discharged from the hospital October 20, admission on October 17  Chronic problem with her vision. Hearing problem  She has macular degeneration.   Previous history of palpitations and event monitor was performed that showed occasional sinus bradycardia., Normal sinus rhythm mostly with frequent APCs. No other significant arrhythmia  PMH:   has a past medical history of Anginal pain ([place]); Anxiety; Cataract; Chronic chest pain; Decreased appetite; Depression; DM2 (diabetes mellitus, type 2) ([place]); Family history of adverse reaction to anesthesia; GERD (gastroesophageal reflux disease); Heart murmur; HTN (hypertension); Hypercholesterolemia; Hypomagnesemia (06/13/2014); Irregular cardiac rhythm; Macular degeneration; Murmur; Non Hodgkin's lymphoma ([place]) (2015); Non-obstructive CAD; Orthostatic hypotension; Palpitations; Pneumonia (June 2017 and Oct 2017); and Tinnitus.  PSH:    Past Surgical History:  Procedure Laterality Date  . ABDOMINAL HYSTERECTOMY    .  ANKLE SURGERY Left   . CARDIAC CATHETERIZATION  2002  . CHEST TUBE INSERTION  Right 03/28/2016   Procedure: PLEURX CATHETER REMOVAL;  Surgeon: Nestor Lewandowsky, MD;  Location: [facility] ORS;  Service: General;  Laterality: Right;  . INSERTION / PLACEMENT PLEURAL CATHETER    . KNEE SURGERY Left   . PARTIAL HYSTERECTOMY    . TOOTH EXTRACTION    . TUMOR REMOVAL  2012   benign neck tumor removed    Current Outpatient Prescriptions  Medication Sig Dispense Refill  . albuterol (PROVENTIL HFA;VENTOLIN HFA) 108 (90 Base) MCG/ACT inhaler Inhale 1-2 puffs into the lungs every 4 (four) hours as needed for wheezing or shortness of breath. 1 Inhaler 2  . albuterol (PROVENTIL) (2.5 MG/3ML) 0.083% nebulizer solution Inhale 3 mLs (2.5 mg total) into the lungs every 6 (six) hours as needed. 360 mL 2  . ALPRAZolam (XANAX) 0.25 MG tablet Take 1 tablet (0.25 mg total) by mouth every 6 (six) hours as needed. 30 tablet 1  . benzonatate (TESSALON) 100 MG capsule TAKE 1 CAPSULE(100 MG) BY MOUTH THREE TIMES DAILY AS NEEDED FOR COUGH 30 capsule 6  . DOK 100 MG capsule TAKE 1 CAPSULE(100 MG) BY MOUTH TWICE DAILY 60 capsule 0  . feeding supplement, ENSURE ENLIVE, (ENSURE ENLIVE) LIQD Take 237 mLs by mouth 3 (three) times daily between meals. 237 mL 12  . fluticasone (FLONASE) 50 MCG/ACT nasal spray Place 1 spray into both nostrils daily. (Patient taking differently: Place 2 sprays into both nostrils daily. ) 16 g 6  . formoterol (PERFOROMIST) 20 MCG/2ML nebulizer solution Take 2 mLs (20 mcg total) by nebulization 2 (two) times daily. DX: chronic bronchitis DX Code: J42 120 mL 6  . guaifenesin (COUGH SYRUP) 100 MG/5ML syrup Take 200 mg by mouth every 4 (four) hours as needed.    Marland Kitchen ipratropium-albuterol (DUONEB) 0.5-2.5 (3) MG/3ML SOLN TAKE 3 MLS BY NEBULIZATION EVERY 4 HOURS DX: Chronic Bronchitis DX Code: J42 1620 mL 2  . lidocaine-prilocaine (EMLA) cream APPLY TOPICALLY AS NEEDED. 30 g 0  . meclizine (ANTIVERT) 12.5 MG tablet Take 12.5 mg by mouth 3 (three) times daily as needed.    . megestrol (MEGACE) 40  MG/ML suspension SHAKE LIQUID AND TAKE 10 ML(400 MG) BY MOUTH DAILY 240 mL 0  . Multiple Vitamin (MULTIVITAMIN WITH MINERALS) TABS tablet Take 1 tablet by mouth daily.    . mupirocin cream (BACTROBAN) 2 % Apply 1 application topically 3 (three) times daily.    . mupirocin ointment (BACTROBAN) 2 % Apply 1 application topically 3 (three) times daily. 22 g 0  . nitroGLYCERIN (NITROSTAT) 0.4 MG SL tablet Place 0.4 mg under the tongue every 5 (five) minutes x 3 doses as needed for chest pain. Reported on 07/15/2015    . pantoprazole (PROTONIX) 40 MG tablet Take 40 mg by mouth daily.    . polyethylene glycol powder (GLYCOLAX/MIRALAX) powder Take 17 g by mouth daily as needed. 3350 g 0  . Respiratory Therapy Supplies (FLUTTER) DEVI Use as directed 1 each 0  . SLOW-MAG 71.5-119 MG TBEC SR tablet TAKE 1 TABLET (64 MG TOTAL) BY MOUTH 2 (TWO) TIMES DAILY. 60 tablet 3  . terbinafine (LAMISIL AT) 1 % cream Apply 1 application topically 3 (three) times daily. 30 g 3  . traMADol (ULTRAM) 50 MG tablet Take 1 tablet by mouth every 4-6 hours as needed for pain 90 tablet 0  . levofloxacin (LEVAQUIN) 750 MG tablet Take 1 tablet (750 mg total) by mouth  daily. 14 tablet 0   No current facility-administered medications for this visit.    Facility-Administered Medications Ordered in Other Visits  Medication Dose Route Frequency Provider Last Rate Last Dose  . heparin lock flush 100 unit/mL  500 Units Intravenous Once Forest Gleason, MD      . sodium chloride 0.9 % injection 10 mL  10 mL Intravenous PRN Forest Gleason, MD   10 mL at 07/24/14 1020  . sodium chloride 0.9 % injection 10 mL  10 mL Intravenous PRN Forest Gleason, MD   10 mL at 08/12/14 0834     Allergies:   Clindamycin; Moxifloxacin; Prednisone; Codeine; Hydrocodone; Penicillins; Sulfa antibiotics; and Unasyn [ampicillin-sulbactam sodium]   Social History:  The patient  reports that she quit smoking about 28 years ago. Her smoking use included Cigarettes. She  has a 1.75 pack-year smoking history. She has never used smokeless tobacco. She reports that she drinks alcohol. She reports that she does not use drugs.   Family History:   family history includes Bladder Cancer in her brother; Heart disease in her mother; Lung cancer in her brother; Stomach cancer in her brother.    Review of Systems: Review of Systems  Respiratory: Positive for cough, sputum production and shortness of breath.   Cardiovascular: Negative.   Gastrointestinal: Negative.   Musculoskeletal: Negative.        Gait instability  Neurological: Positive for weakness.  Psychiatric/Behavioral: Negative.   All other systems reviewed and are negative.    PHYSICAL EXAM: VS:  BP 126/60 (BP Location: Left Arm, Patient Position: Sitting, Cuff Size: Normal)   Pulse (!) 121   Ht 5' 2.5" (1.588 m)   Wt 122 lb (55.3 kg)   SpO2 95%   BMI 21.96 kg/m  , BMI Body mass index is 21.96 kg/m. GEN: Well nourished, well developed, in no acute distress, significant upper airway sounding cough, presenting a wheelchair HEENT: normal  Neck: no JVD, carotid bruits, or masses Cardiac: RRR; no murmurs, rubs, or gallops,no edema  Respiratory:  Scattered Rales,  normal work of breathing GI: soft, nontender, nondistended, + BS MS: no deformity or atrophy  Skin: warm and dry, no rash Neuro:  Strength and sensation are intact Psych: euthymic mood, full affect    Recent Labs: 07/19/2015: TSH 0.747 04/25/2016: ALT 11; BUN 12; Creatinine, Ser 0.93; Hemoglobin 12.3; Platelets 277; Potassium 3.7; Sodium 137 05/17/2016: Magnesium 1.5    Lipid Panel No results found for: CHOL, HDL, LDLCALC, TRIG    Wt Readings from Last 3 Encounters:  05/24/16 122 lb (55.3 kg)  04/25/16 120 lb (54.4 kg)  03/28/16 121 lb (54.9 kg)       ASSESSMENT AND PLAN:  Sinus tachycardia Secondary to underlying hypoxia and respiratory distress  Diastolic CHF, chronic ([place]) - Plan: EKG 12-Lead Likely euvolemic, no leg  edema,  No medication changes made  Essential hypertension, benign - Plan: EKG 12-Lead Blood pressure is well controlled on today's visit. No changes made to the medications.  Acute on chronic bronchitis Recent hospitalizations with similar symptoms Only did a short course of Levaquin We have recommended she do a longer course of Levaquin as she was doing better until several days ago. Prescription sent in.  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 EKG 12-Lead     Signed, Esmond Plants, M.D., Ph.D. 05/24/2016  [place], [place]

## 2016-05-23 ENCOUNTER — Other Ambulatory Visit: Payer: Self-pay | Admitting: Internal Medicine

## 2016-05-23 ENCOUNTER — Telehealth: Payer: Self-pay | Admitting: *Deleted

## 2016-05-23 NOTE — Telephone Encounter (Signed)
Daughter asking for a hospice assessment to be done. Please fax orders if you are in agreement with this.

## 2016-05-24 ENCOUNTER — Encounter: Payer: Self-pay | Admitting: Cardiovascular Disease

## 2016-05-24 ENCOUNTER — Ambulatory Visit (INDEPENDENT_AMBULATORY_CARE_PROVIDER_SITE_OTHER): Payer: Medicare Other | Admitting: Cardiovascular Disease

## 2016-05-24 VITALS — BP 126/60 | HR 121 | Ht 62.5 in | Wt 122.0 lb

## 2016-05-24 DIAGNOSIS — I10 Essential (primary) hypertension: Secondary | ICD-10-CM

## 2016-05-24 DIAGNOSIS — J90 Pleural effusion, not elsewhere classified: Secondary | ICD-10-CM | POA: Diagnosis not present

## 2016-05-24 DIAGNOSIS — E7800 Pure hypercholesterolemia, unspecified: Secondary | ICD-10-CM

## 2016-05-24 DIAGNOSIS — C8213 Follicular lymphoma grade II, intra-abdominal lymph nodes: Secondary | ICD-10-CM | POA: Diagnosis not present

## 2016-05-24 DIAGNOSIS — I5032 Chronic diastolic (congestive) heart failure: Secondary | ICD-10-CM

## 2016-05-24 MED ORDER — LEVOFLOXACIN 750 MG PO TABS: 750.0000 mg | ORAL_TABLET | Freq: Every day | ORAL | 0 refills | Status: AC

## 2016-05-24 NOTE — Telephone Encounter (Signed)
Verbal order called to Triage nurse Ivin Booty

## 2016-05-24 NOTE — Patient Instructions (Addendum)
Medication Instructions:   Levaquin for 10 to  14 days one pill daily Neb treatments   Labwork:  No new labs needed  Testing/Procedures:  No further testing at this time   I recommend watching educational videos on topics of interest to you at:       [email or web address]  Enter code: [email or web address]    Follow-Up: It was a pleasure seeing you in the office today. Please call us if you have new issues that need to be addressed before your next appt.  [phone]  Your physician wants you to follow-up in: 6 months.  You will receive a reminder letter in the mail two months in advance. If you don't receive a letter, please call our office to schedule the follow-up appointment.  If you need a refill on your cardiac medications before your next appointment, please call your pharmacy.

## 2016-05-24 NOTE — Progress Notes (Signed)
Pt's daughter was advised to take our O2 tank home and bring back later, so that pt would not be w/o O2 during the trip home.  When staff wheeled pt to [place] parking, pt's daughter stated that it was too much trouble to have to come back to return the tank and asked that staff return it to the office.

## 2016-05-24 NOTE — Telephone Encounter (Signed)
Hospice orders faxed 

## 2016-05-25 DIAGNOSIS — J910 Malignant pleural effusion: Secondary | ICD-10-CM | POA: Diagnosis not present

## 2016-05-25 DIAGNOSIS — I110 Hypertensive heart disease with heart failure: Secondary | ICD-10-CM | POA: Diagnosis not present

## 2016-05-25 DIAGNOSIS — E785 Hyperlipidemia, unspecified: Secondary | ICD-10-CM | POA: Diagnosis not present

## 2016-05-25 DIAGNOSIS — Z87891 Personal history of nicotine dependence: Secondary | ICD-10-CM | POA: Diagnosis not present

## 2016-05-25 DIAGNOSIS — I5032 Chronic diastolic (congestive) heart failure: Secondary | ICD-10-CM | POA: Diagnosis not present

## 2016-05-25 DIAGNOSIS — R011 Cardiac murmur, unspecified: Secondary | ICD-10-CM | POA: Diagnosis not present

## 2016-05-25 DIAGNOSIS — E119 Type 2 diabetes mellitus without complications: Secondary | ICD-10-CM | POA: Diagnosis not present

## 2016-05-25 DIAGNOSIS — F329 Major depressive disorder, single episode, unspecified: Secondary | ICD-10-CM | POA: Diagnosis not present

## 2016-05-25 DIAGNOSIS — I050 Rheumatic mitral stenosis: Secondary | ICD-10-CM | POA: Diagnosis not present

## 2016-05-25 DIAGNOSIS — C8213 Follicular lymphoma grade II, intra-abdominal lymph nodes: Secondary | ICD-10-CM | POA: Diagnosis not present

## 2016-05-25 DIAGNOSIS — K219 Gastro-esophageal reflux disease without esophagitis: Secondary | ICD-10-CM | POA: Diagnosis not present

## 2016-05-25 DIAGNOSIS — R1312 Dysphagia, oropharyngeal phase: Secondary | ICD-10-CM | POA: Diagnosis not present

## 2016-05-25 DIAGNOSIS — R918 Other nonspecific abnormal finding of lung field: Secondary | ICD-10-CM | POA: Diagnosis not present

## 2016-05-25 DIAGNOSIS — M1991 Primary osteoarthritis, unspecified site: Secondary | ICD-10-CM | POA: Diagnosis not present

## 2016-05-25 DIAGNOSIS — I25119 Atherosclerotic heart disease of native coronary artery with unspecified angina pectoris: Secondary | ICD-10-CM | POA: Diagnosis not present

## 2016-05-25 DIAGNOSIS — F419 Anxiety disorder, unspecified: Secondary | ICD-10-CM | POA: Diagnosis not present

## 2016-05-25 DIAGNOSIS — H3530 Unspecified macular degeneration: Secondary | ICD-10-CM | POA: Diagnosis not present

## 2016-05-25 DIAGNOSIS — Z9981 Dependence on supplemental oxygen: Secondary | ICD-10-CM | POA: Diagnosis not present

## 2016-05-26 ENCOUNTER — Telehealth: Payer: Self-pay | Admitting: *Deleted

## 2016-05-26 DIAGNOSIS — I25119 Atherosclerotic heart disease of native coronary artery with unspecified angina pectoris: Secondary | ICD-10-CM | POA: Diagnosis not present

## 2016-05-26 DIAGNOSIS — I5032 Chronic diastolic (congestive) heart failure: Secondary | ICD-10-CM | POA: Diagnosis not present

## 2016-05-26 DIAGNOSIS — J910 Malignant pleural effusion: Secondary | ICD-10-CM | POA: Diagnosis not present

## 2016-05-26 DIAGNOSIS — C8213 Follicular lymphoma grade II, intra-abdominal lymph nodes: Secondary | ICD-10-CM | POA: Diagnosis not present

## 2016-05-26 DIAGNOSIS — I110 Hypertensive heart disease with heart failure: Secondary | ICD-10-CM | POA: Diagnosis not present

## 2016-05-26 DIAGNOSIS — E119 Type 2 diabetes mellitus without complications: Secondary | ICD-10-CM | POA: Diagnosis not present

## 2016-05-26 MED ORDER — MORPHINE SULFATE (CONCENTRATE) 20 MG/ML PO SOLN: ORAL | 0 refills | Status: AC

## 2016-05-26 MED ORDER — MORPHINE SULFATE (CONCENTRATE) 10 MG /0.5 ML PO SOLN: ORAL | 0 refills | Status: DC

## 2016-05-26 NOTE — Telephone Encounter (Signed)
Having a great deal of SOB and thinks she would benefit from having Roxanol. Please advise.

## 2016-05-27 DIAGNOSIS — J910 Malignant pleural effusion: Secondary | ICD-10-CM | POA: Diagnosis not present

## 2016-05-27 DIAGNOSIS — C8213 Follicular lymphoma grade II, intra-abdominal lymph nodes: Secondary | ICD-10-CM | POA: Diagnosis not present

## 2016-05-27 DIAGNOSIS — I5032 Chronic diastolic (congestive) heart failure: Secondary | ICD-10-CM | POA: Diagnosis not present

## 2016-05-27 DIAGNOSIS — I110 Hypertensive heart disease with heart failure: Secondary | ICD-10-CM | POA: Diagnosis not present

## 2016-05-27 DIAGNOSIS — I25119 Atherosclerotic heart disease of native coronary artery with unspecified angina pectoris: Secondary | ICD-10-CM | POA: Diagnosis not present

## 2016-05-27 DIAGNOSIS — E119 Type 2 diabetes mellitus without complications: Secondary | ICD-10-CM | POA: Diagnosis not present

## 2016-05-29 DIAGNOSIS — J910 Malignant pleural effusion: Secondary | ICD-10-CM | POA: Diagnosis not present

## 2016-05-29 DIAGNOSIS — C8213 Follicular lymphoma grade II, intra-abdominal lymph nodes: Secondary | ICD-10-CM | POA: Diagnosis not present

## 2016-05-29 DIAGNOSIS — I25119 Atherosclerotic heart disease of native coronary artery with unspecified angina pectoris: Secondary | ICD-10-CM | POA: Diagnosis not present

## 2016-05-29 DIAGNOSIS — E119 Type 2 diabetes mellitus without complications: Secondary | ICD-10-CM | POA: Diagnosis not present

## 2016-05-29 DIAGNOSIS — I5032 Chronic diastolic (congestive) heart failure: Secondary | ICD-10-CM | POA: Diagnosis not present

## 2016-05-29 DIAGNOSIS — I110 Hypertensive heart disease with heart failure: Secondary | ICD-10-CM | POA: Diagnosis not present

## 2016-05-30 DIAGNOSIS — I5032 Chronic diastolic (congestive) heart failure: Secondary | ICD-10-CM | POA: Diagnosis not present

## 2016-05-30 DIAGNOSIS — J910 Malignant pleural effusion: Secondary | ICD-10-CM | POA: Diagnosis not present

## 2016-05-30 DIAGNOSIS — C8213 Follicular lymphoma grade II, intra-abdominal lymph nodes: Secondary | ICD-10-CM | POA: Diagnosis not present

## 2016-05-30 DIAGNOSIS — I110 Hypertensive heart disease with heart failure: Secondary | ICD-10-CM | POA: Diagnosis not present

## 2016-05-30 DIAGNOSIS — E119 Type 2 diabetes mellitus without complications: Secondary | ICD-10-CM | POA: Diagnosis not present

## 2016-05-30 DIAGNOSIS — I25119 Atherosclerotic heart disease of native coronary artery with unspecified angina pectoris: Secondary | ICD-10-CM | POA: Diagnosis not present

## 2016-05-31 ENCOUNTER — Telehealth: Payer: Self-pay | Admitting: *Deleted

## 2016-05-31 DIAGNOSIS — E119 Type 2 diabetes mellitus without complications: Secondary | ICD-10-CM | POA: Diagnosis not present

## 2016-05-31 DIAGNOSIS — I5032 Chronic diastolic (congestive) heart failure: Secondary | ICD-10-CM | POA: Diagnosis not present

## 2016-05-31 DIAGNOSIS — I25119 Atherosclerotic heart disease of native coronary artery with unspecified angina pectoris: Secondary | ICD-10-CM | POA: Diagnosis not present

## 2016-05-31 DIAGNOSIS — I110 Hypertensive heart disease with heart failure: Secondary | ICD-10-CM | POA: Diagnosis not present

## 2016-05-31 DIAGNOSIS — C8213 Follicular lymphoma grade II, intra-abdominal lymph nodes: Secondary | ICD-10-CM | POA: Diagnosis not present

## 2016-05-31 DIAGNOSIS — B372 Candidiasis of skin and nail: Secondary | ICD-10-CM

## 2016-05-31 DIAGNOSIS — J910 Malignant pleural effusion: Secondary | ICD-10-CM | POA: Diagnosis not present

## 2016-05-31 MED ORDER — NYSTATIN 100000 UNIT/GM EX POWD: Freq: Four times a day (QID) | CUTANEOUS | 1 refills | Status: AC

## 2016-05-31 NOTE — Telephone Encounter (Signed)
md recommends nystatin (MYCOSTATIN/NYSTOP) powder. rx sent to patient's pharmacy.

## 2016-05-31 NOTE — Telephone Encounter (Signed)
Has been on Levaquin 750 mg per Dr Rockey Situ for acute Bronchitis. She has what looks to be yeast in her groin, area very red and family has caked it with Cornstarch. Asking for something to be ordered for it. Please advise

## 2016-05-31 NOTE — Telephone Encounter (Signed)
Caroline Russo informed of new order

## 2016-06-01 ENCOUNTER — Other Ambulatory Visit: Payer: Self-pay | Admitting: *Deleted

## 2016-06-01 DIAGNOSIS — I110 Hypertensive heart disease with heart failure: Secondary | ICD-10-CM | POA: Diagnosis not present

## 2016-06-01 DIAGNOSIS — I5032 Chronic diastolic (congestive) heart failure: Secondary | ICD-10-CM | POA: Diagnosis not present

## 2016-06-01 DIAGNOSIS — I25119 Atherosclerotic heart disease of native coronary artery with unspecified angina pectoris: Secondary | ICD-10-CM | POA: Diagnosis not present

## 2016-06-01 DIAGNOSIS — J910 Malignant pleural effusion: Secondary | ICD-10-CM | POA: Diagnosis not present

## 2016-06-01 DIAGNOSIS — E119 Type 2 diabetes mellitus without complications: Secondary | ICD-10-CM | POA: Diagnosis not present

## 2016-06-01 DIAGNOSIS — C8213 Follicular lymphoma grade II, intra-abdominal lymph nodes: Secondary | ICD-10-CM | POA: Diagnosis not present

## 2016-06-01 MED ORDER — ALPRAZOLAM 0.25 MG PO TABS: ORAL_TABLET | ORAL | 3 refills | Status: AC

## 2016-06-02 DIAGNOSIS — J910 Malignant pleural effusion: Secondary | ICD-10-CM | POA: Diagnosis not present

## 2016-06-02 DIAGNOSIS — I110 Hypertensive heart disease with heart failure: Secondary | ICD-10-CM | POA: Diagnosis not present

## 2016-06-02 DIAGNOSIS — I25119 Atherosclerotic heart disease of native coronary artery with unspecified angina pectoris: Secondary | ICD-10-CM | POA: Diagnosis not present

## 2016-06-02 DIAGNOSIS — I5032 Chronic diastolic (congestive) heart failure: Secondary | ICD-10-CM | POA: Diagnosis not present

## 2016-06-02 DIAGNOSIS — E119 Type 2 diabetes mellitus without complications: Secondary | ICD-10-CM | POA: Diagnosis not present

## 2016-06-02 DIAGNOSIS — C8213 Follicular lymphoma grade II, intra-abdominal lymph nodes: Secondary | ICD-10-CM | POA: Diagnosis not present

## 2016-06-03 DIAGNOSIS — J910 Malignant pleural effusion: Secondary | ICD-10-CM | POA: Diagnosis not present

## 2016-06-03 DIAGNOSIS — E119 Type 2 diabetes mellitus without complications: Secondary | ICD-10-CM | POA: Diagnosis not present

## 2016-06-03 DIAGNOSIS — I110 Hypertensive heart disease with heart failure: Secondary | ICD-10-CM | POA: Diagnosis not present

## 2016-06-03 DIAGNOSIS — I5032 Chronic diastolic (congestive) heart failure: Secondary | ICD-10-CM | POA: Diagnosis not present

## 2016-06-03 DIAGNOSIS — C8213 Follicular lymphoma grade II, intra-abdominal lymph nodes: Secondary | ICD-10-CM | POA: Diagnosis not present

## 2016-06-03 DIAGNOSIS — I25119 Atherosclerotic heart disease of native coronary artery with unspecified angina pectoris: Secondary | ICD-10-CM | POA: Diagnosis not present

## 2016-06-06 DIAGNOSIS — I5032 Chronic diastolic (congestive) heart failure: Secondary | ICD-10-CM | POA: Diagnosis not present

## 2016-06-06 DIAGNOSIS — I25119 Atherosclerotic heart disease of native coronary artery with unspecified angina pectoris: Secondary | ICD-10-CM | POA: Diagnosis not present

## 2016-06-06 DIAGNOSIS — J910 Malignant pleural effusion: Secondary | ICD-10-CM | POA: Diagnosis not present

## 2016-06-06 DIAGNOSIS — C8213 Follicular lymphoma grade II, intra-abdominal lymph nodes: Secondary | ICD-10-CM | POA: Diagnosis not present

## 2016-06-06 DIAGNOSIS — E119 Type 2 diabetes mellitus without complications: Secondary | ICD-10-CM | POA: Diagnosis not present

## 2016-06-06 DIAGNOSIS — I110 Hypertensive heart disease with heart failure: Secondary | ICD-10-CM | POA: Diagnosis not present

## 2016-06-07 ENCOUNTER — Ambulatory Visit: Payer: Medicare Other | Admitting: Internal Medicine

## 2016-06-07 DIAGNOSIS — E119 Type 2 diabetes mellitus without complications: Secondary | ICD-10-CM | POA: Diagnosis not present

## 2016-06-07 DIAGNOSIS — I25119 Atherosclerotic heart disease of native coronary artery with unspecified angina pectoris: Secondary | ICD-10-CM | POA: Diagnosis not present

## 2016-06-07 DIAGNOSIS — C8213 Follicular lymphoma grade II, intra-abdominal lymph nodes: Secondary | ICD-10-CM | POA: Diagnosis not present

## 2016-06-07 DIAGNOSIS — I5032 Chronic diastolic (congestive) heart failure: Secondary | ICD-10-CM | POA: Diagnosis not present

## 2016-06-07 DIAGNOSIS — I110 Hypertensive heart disease with heart failure: Secondary | ICD-10-CM | POA: Diagnosis not present

## 2016-06-07 DIAGNOSIS — J910 Malignant pleural effusion: Secondary | ICD-10-CM | POA: Diagnosis not present

## 2016-06-08 DIAGNOSIS — I25119 Atherosclerotic heart disease of native coronary artery with unspecified angina pectoris: Secondary | ICD-10-CM | POA: Diagnosis not present

## 2016-06-08 DIAGNOSIS — I5032 Chronic diastolic (congestive) heart failure: Secondary | ICD-10-CM | POA: Diagnosis not present

## 2016-06-08 DIAGNOSIS — E119 Type 2 diabetes mellitus without complications: Secondary | ICD-10-CM | POA: Diagnosis not present

## 2016-06-08 DIAGNOSIS — I110 Hypertensive heart disease with heart failure: Secondary | ICD-10-CM | POA: Diagnosis not present

## 2016-06-08 DIAGNOSIS — J910 Malignant pleural effusion: Secondary | ICD-10-CM | POA: Diagnosis not present

## 2016-06-08 DIAGNOSIS — C8213 Follicular lymphoma grade II, intra-abdominal lymph nodes: Secondary | ICD-10-CM | POA: Diagnosis not present

## 2016-06-09 DIAGNOSIS — C8213 Follicular lymphoma grade II, intra-abdominal lymph nodes: Secondary | ICD-10-CM | POA: Diagnosis not present

## 2016-06-09 DIAGNOSIS — J910 Malignant pleural effusion: Secondary | ICD-10-CM | POA: Diagnosis not present

## 2016-06-09 DIAGNOSIS — I25119 Atherosclerotic heart disease of native coronary artery with unspecified angina pectoris: Secondary | ICD-10-CM | POA: Diagnosis not present

## 2016-06-09 DIAGNOSIS — E119 Type 2 diabetes mellitus without complications: Secondary | ICD-10-CM | POA: Diagnosis not present

## 2016-06-09 DIAGNOSIS — I110 Hypertensive heart disease with heart failure: Secondary | ICD-10-CM | POA: Diagnosis not present

## 2016-06-09 DIAGNOSIS — I5032 Chronic diastolic (congestive) heart failure: Secondary | ICD-10-CM | POA: Diagnosis not present

## 2016-06-13 DIAGNOSIS — C8213 Follicular lymphoma grade II, intra-abdominal lymph nodes: Secondary | ICD-10-CM | POA: Diagnosis not present

## 2016-06-13 DIAGNOSIS — J910 Malignant pleural effusion: Secondary | ICD-10-CM | POA: Diagnosis not present

## 2016-06-13 DIAGNOSIS — I5032 Chronic diastolic (congestive) heart failure: Secondary | ICD-10-CM | POA: Diagnosis not present

## 2016-06-13 DIAGNOSIS — I25119 Atherosclerotic heart disease of native coronary artery with unspecified angina pectoris: Secondary | ICD-10-CM | POA: Diagnosis not present

## 2016-06-13 DIAGNOSIS — I110 Hypertensive heart disease with heart failure: Secondary | ICD-10-CM | POA: Diagnosis not present

## 2016-06-13 DIAGNOSIS — E119 Type 2 diabetes mellitus without complications: Secondary | ICD-10-CM | POA: Diagnosis not present

## 2016-06-15 DIAGNOSIS — E119 Type 2 diabetes mellitus without complications: Secondary | ICD-10-CM | POA: Diagnosis not present

## 2016-06-15 DIAGNOSIS — C8213 Follicular lymphoma grade II, intra-abdominal lymph nodes: Secondary | ICD-10-CM | POA: Diagnosis not present

## 2016-06-15 DIAGNOSIS — I25119 Atherosclerotic heart disease of native coronary artery with unspecified angina pectoris: Secondary | ICD-10-CM | POA: Diagnosis not present

## 2016-06-15 DIAGNOSIS — I110 Hypertensive heart disease with heart failure: Secondary | ICD-10-CM | POA: Diagnosis not present

## 2016-06-15 DIAGNOSIS — J910 Malignant pleural effusion: Secondary | ICD-10-CM | POA: Diagnosis not present

## 2016-06-15 DIAGNOSIS — I5032 Chronic diastolic (congestive) heart failure: Secondary | ICD-10-CM | POA: Diagnosis not present

## 2016-06-16 DIAGNOSIS — I25119 Atherosclerotic heart disease of native coronary artery with unspecified angina pectoris: Secondary | ICD-10-CM | POA: Diagnosis not present

## 2016-06-16 DIAGNOSIS — E119 Type 2 diabetes mellitus without complications: Secondary | ICD-10-CM | POA: Diagnosis not present

## 2016-06-16 DIAGNOSIS — C8213 Follicular lymphoma grade II, intra-abdominal lymph nodes: Secondary | ICD-10-CM | POA: Diagnosis not present

## 2016-06-16 DIAGNOSIS — I5032 Chronic diastolic (congestive) heart failure: Secondary | ICD-10-CM | POA: Diagnosis not present

## 2016-06-16 DIAGNOSIS — J910 Malignant pleural effusion: Secondary | ICD-10-CM | POA: Diagnosis not present

## 2016-06-16 DIAGNOSIS — I110 Hypertensive heart disease with heart failure: Secondary | ICD-10-CM | POA: Diagnosis not present

## 2016-06-17 DIAGNOSIS — C8213 Follicular lymphoma grade II, intra-abdominal lymph nodes: Secondary | ICD-10-CM | POA: Diagnosis not present

## 2016-06-17 DIAGNOSIS — E119 Type 2 diabetes mellitus without complications: Secondary | ICD-10-CM | POA: Diagnosis not present

## 2016-06-17 DIAGNOSIS — I5032 Chronic diastolic (congestive) heart failure: Secondary | ICD-10-CM | POA: Diagnosis not present

## 2016-06-17 DIAGNOSIS — I110 Hypertensive heart disease with heart failure: Secondary | ICD-10-CM | POA: Diagnosis not present

## 2016-06-17 DIAGNOSIS — I25119 Atherosclerotic heart disease of native coronary artery with unspecified angina pectoris: Secondary | ICD-10-CM | POA: Diagnosis not present

## 2016-06-17 DIAGNOSIS — J910 Malignant pleural effusion: Secondary | ICD-10-CM | POA: Diagnosis not present

## 2016-06-24 DEATH — deceased

## 2016-06-28 ENCOUNTER — Ambulatory Visit: Payer: Medicare Other | Admitting: Internal Medicine

## 2016-07-25 ENCOUNTER — Ambulatory Visit: Payer: Medicare Other | Admitting: Internal Medicine

## 2016-07-25 ENCOUNTER — Other Ambulatory Visit: Payer: Medicare Other

## 2016-07-30 ENCOUNTER — Other Ambulatory Visit: Payer: Self-pay | Admitting: Nurse Practitioner

## 2016-09-27 IMAGING — DX DG CHEST 2V
2 series · 2 of 2 positions shown · non-contrast
Comparison: Radiographs August 13, 2015.

CLINICAL DATA: Bilateral pleural effusions.

EXAM:
CHEST  2 VIEW

[chest pa]
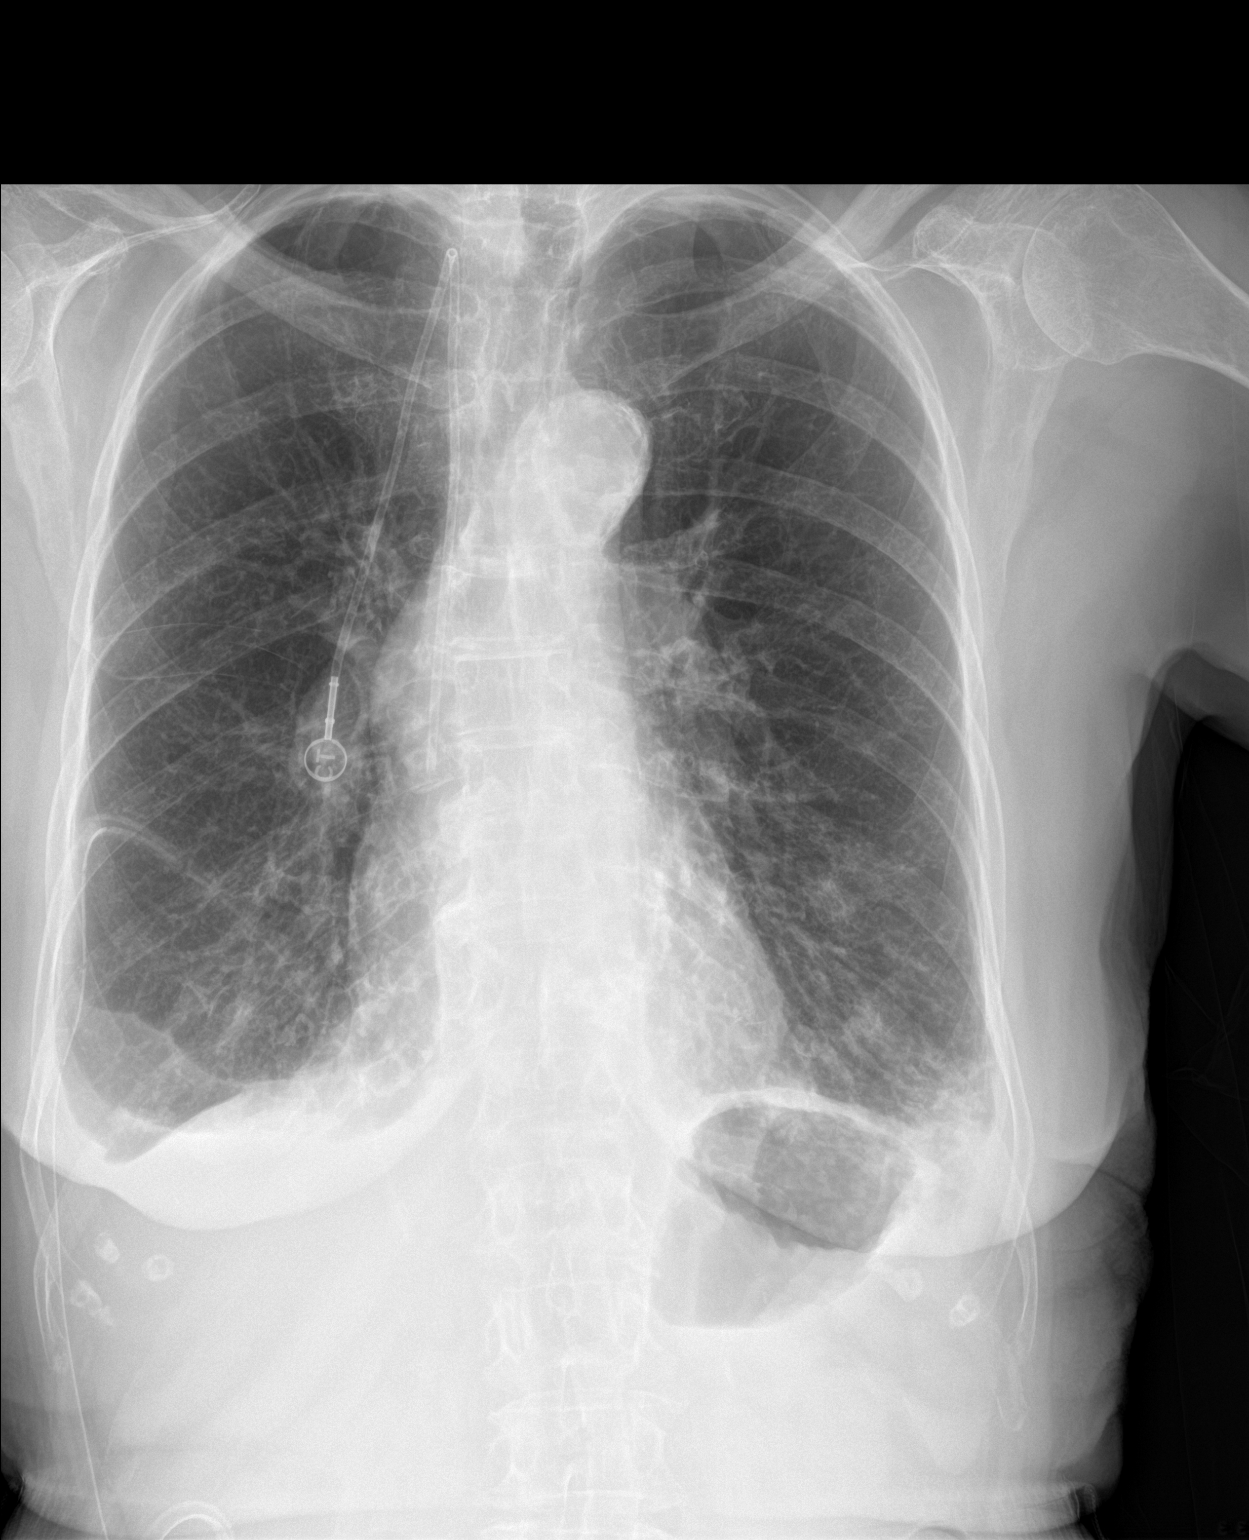

[chest lat]
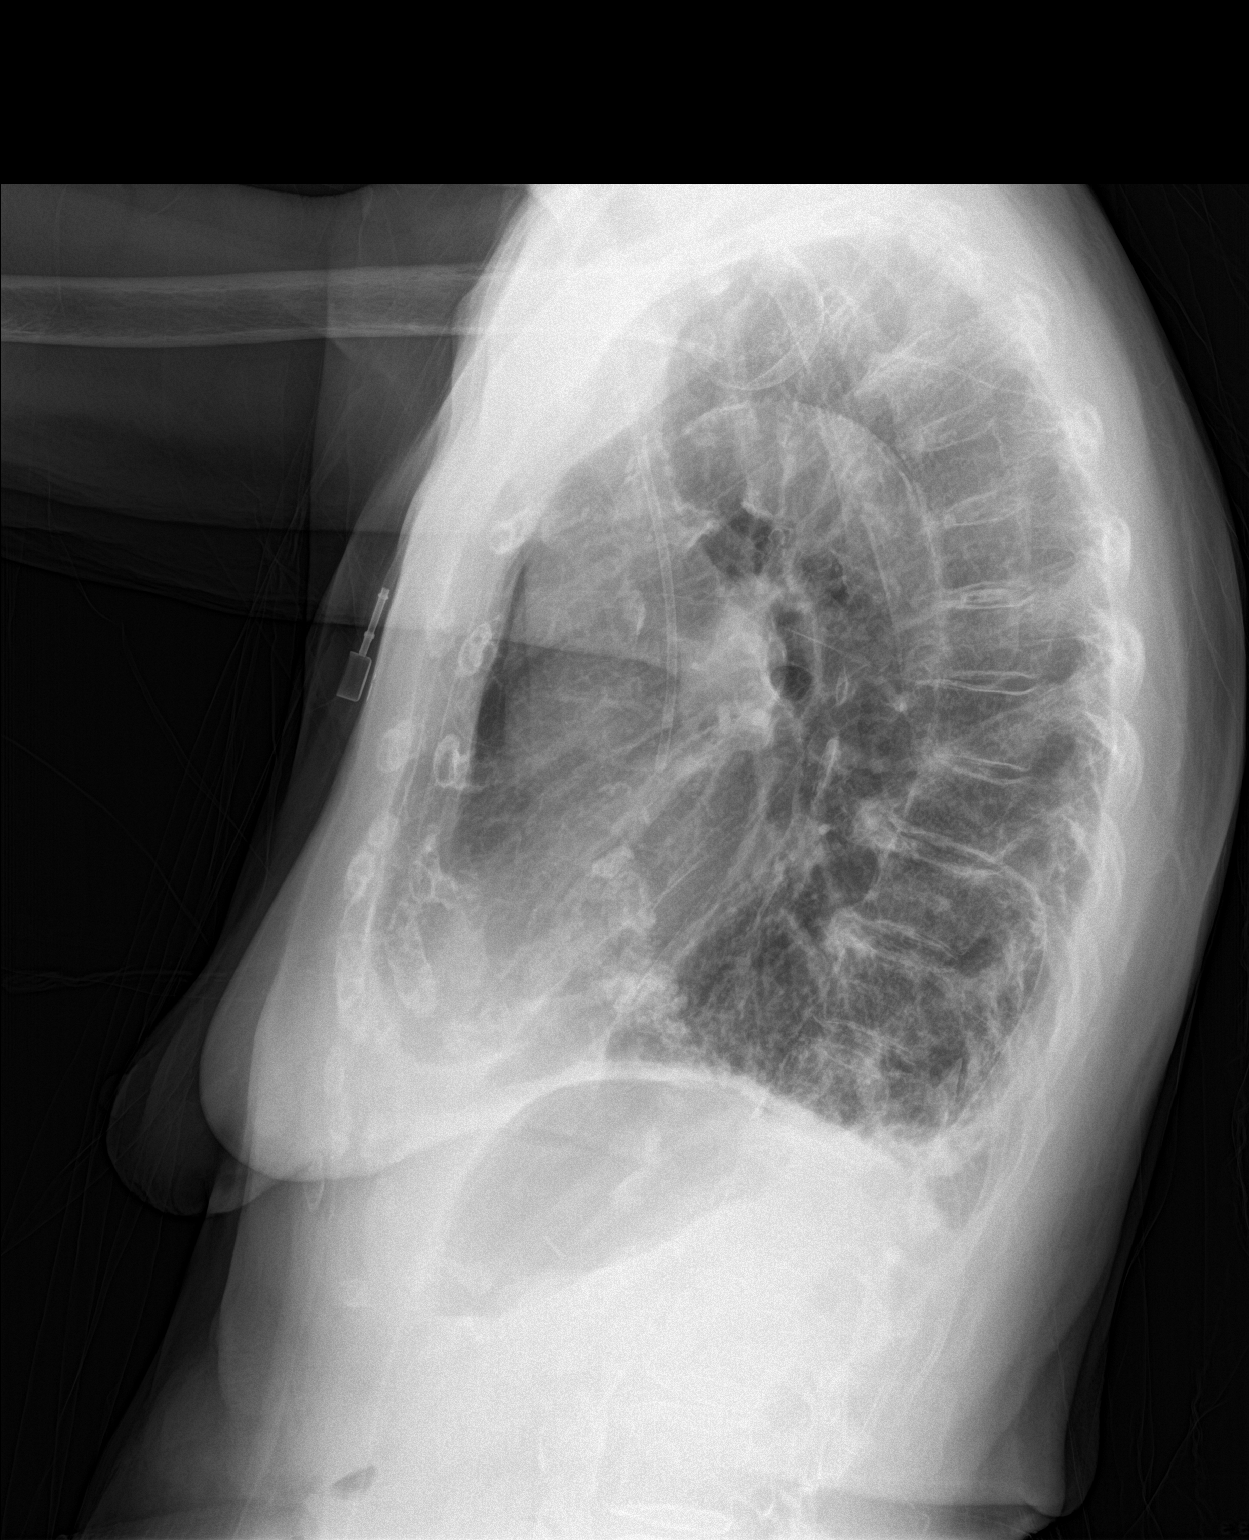

[2 of 2 positions shown; findings below may reference images not displayed]

FINDINGS: Stable cardiomediastinal silhouette. No pneumothorax is noted. Right
internal jugular Port-A-Cath is noted with distal tip in expected
position of the SVC. Atherosclerosis of thoracic aorta is noted.
Right-sided pleural drainage catheter is unchanged with stable mild
right pleural effusion. Minimal left pleural effusion is noted.
Focal airspace opacity is noted laterally in the left lung base most
consistent with subsegmental atelectasis.
IMPRESSION: Stable position of right-sided pleural drainage catheter. Stable
minimal to mild bilateral pleural effusions. New focal airspace
opacity seen laterally in left lung base most consistent with
subsegmental atelectasis.

## 2017-01-20 IMAGING — DX DG CHEST 2V
2 series · 2 of 2 positions shown · non-contrast
Comparison: 11/24/2015

CLINICAL DATA: History of pneumonia, followup examination

EXAM:
CHEST  2 VIEW

[chest pa]
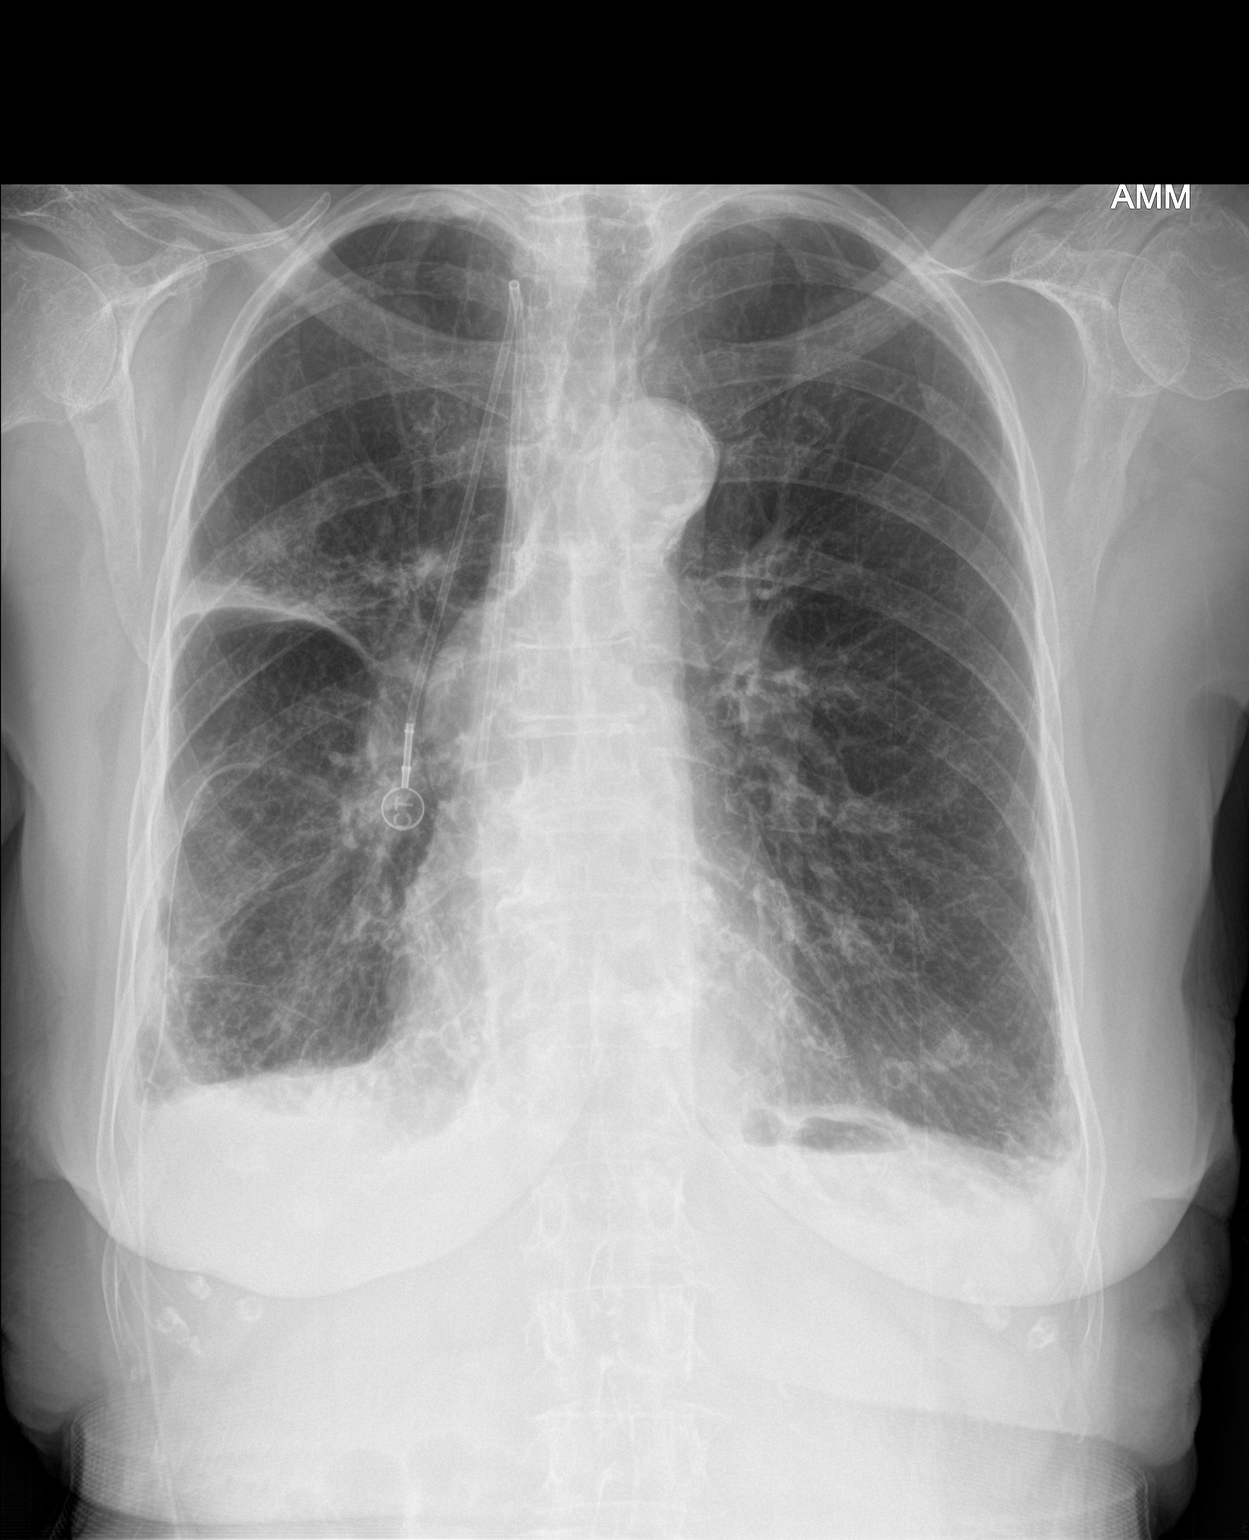

[chest lat]
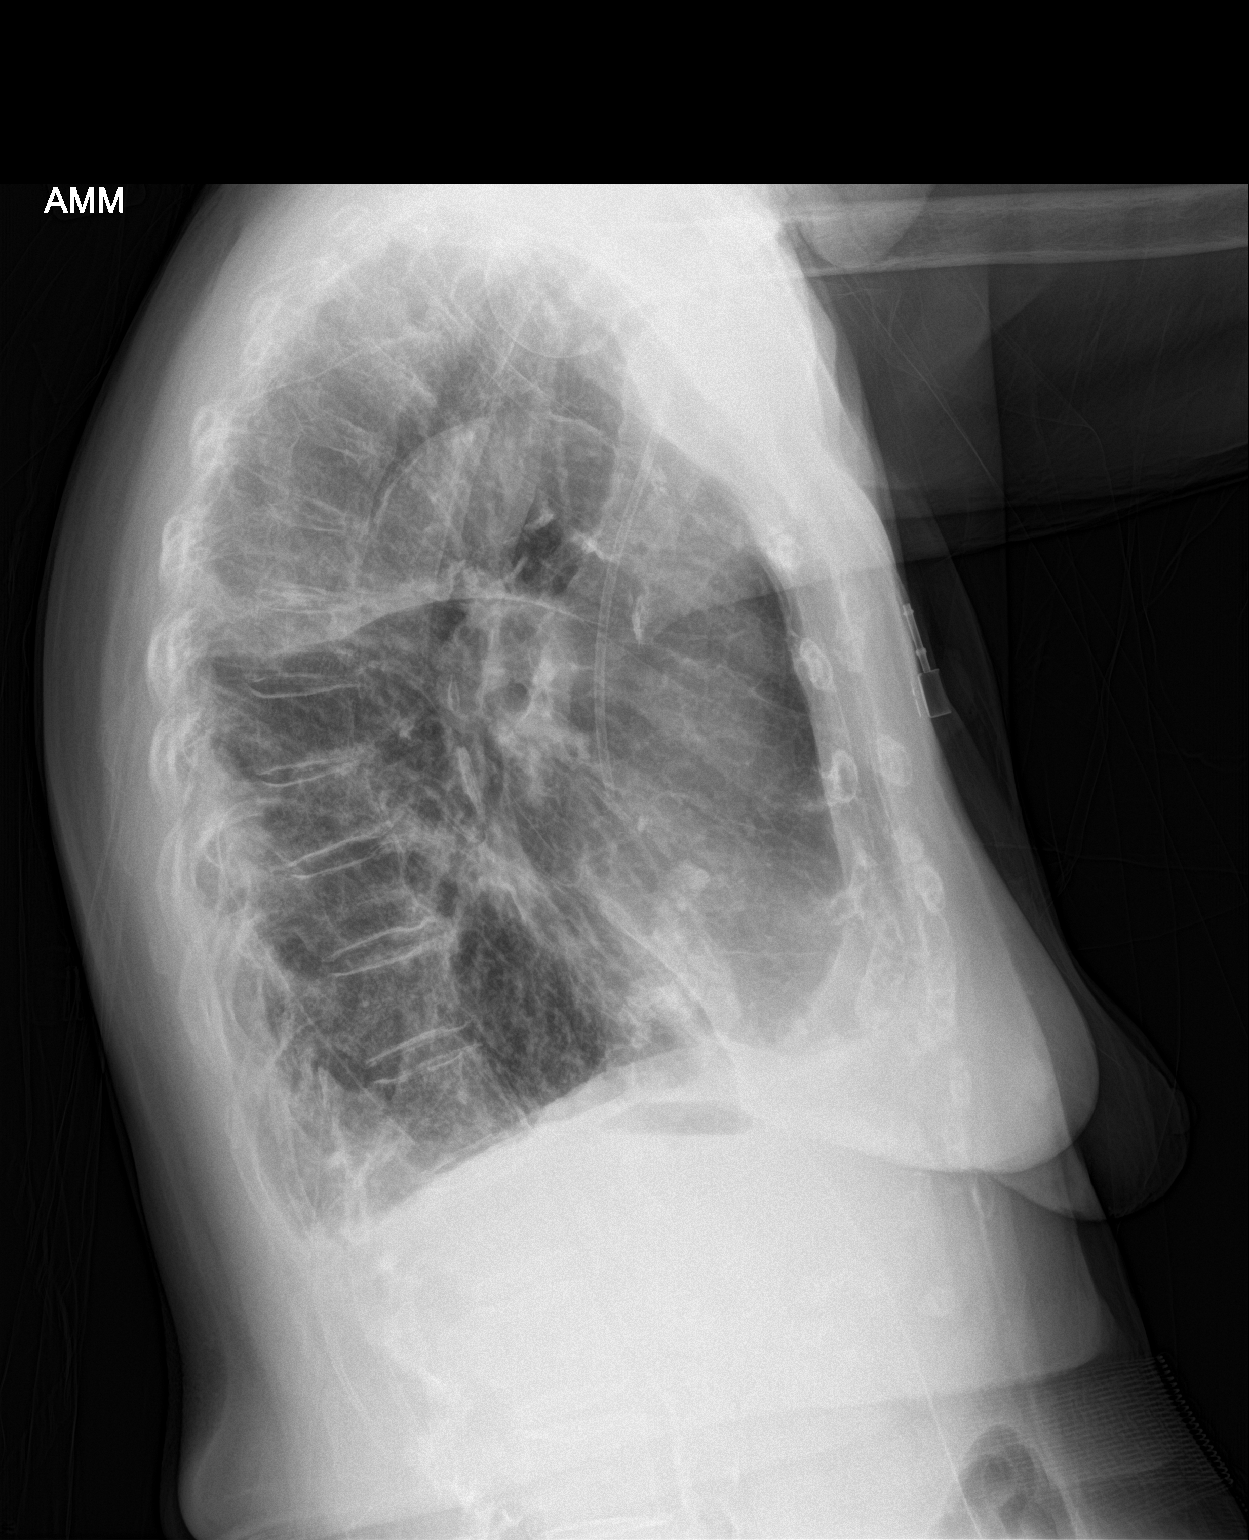

[2 of 2 positions shown; findings below may reference images not displayed]

FINDINGS: Cardiac shadow is stable. Aortic calcifications are again seen.
Right chest port is again seen and stable. Minimal residual
thickening is noted along the minor fissure related to the previous
abnormality. A somewhat nodular density is identified however likely
residual from the previous infiltrate. Chronic interstitial changes
are noted. Chronic blunting of the costophrenic angles is noted. A
PleurX catheter remains on the right.
IMPRESSION: Near complete resolution of previously seen infiltrate. Some nodular
component is noted likely a sequela from prior infiltrate. Continued
follow-up is recommended.
